# Patient Record
Sex: Male | Born: 1945 | Race: White | Hispanic: No | Marital: Married | Smoking: Former smoker
Health system: Southern US, Community
[De-identification: ages and names within clinical notes are randomized; demographics above are authoritative.]

## PROBLEM LIST (undated history)

## (undated) DIAGNOSIS — Z8719 Personal history of other diseases of the digestive system: Secondary | ICD-10-CM

## (undated) DIAGNOSIS — E039 Hypothyroidism, unspecified: Secondary | ICD-10-CM

## (undated) DIAGNOSIS — F32A Depression, unspecified: Secondary | ICD-10-CM

## (undated) DIAGNOSIS — K219 Gastro-esophageal reflux disease without esophagitis: Secondary | ICD-10-CM

## (undated) DIAGNOSIS — J189 Pneumonia, unspecified organism: Secondary | ICD-10-CM

## (undated) DIAGNOSIS — F329 Major depressive disorder, single episode, unspecified: Secondary | ICD-10-CM

## (undated) DIAGNOSIS — F419 Anxiety disorder, unspecified: Secondary | ICD-10-CM

## (undated) DIAGNOSIS — M1710 Unilateral primary osteoarthritis, unspecified knee: Secondary | ICD-10-CM

## (undated) DIAGNOSIS — G4730 Sleep apnea, unspecified: Secondary | ICD-10-CM

## (undated) DIAGNOSIS — I499 Cardiac arrhythmia, unspecified: Secondary | ICD-10-CM

## (undated) DIAGNOSIS — J449 Chronic obstructive pulmonary disease, unspecified: Secondary | ICD-10-CM

## (undated) DIAGNOSIS — C801 Malignant (primary) neoplasm, unspecified: Secondary | ICD-10-CM

## (undated) DIAGNOSIS — H9190 Unspecified hearing loss, unspecified ear: Secondary | ICD-10-CM

## (undated) DIAGNOSIS — Z9989 Dependence on other enabling machines and devices: Secondary | ICD-10-CM

## (undated) DIAGNOSIS — I10 Essential (primary) hypertension: Secondary | ICD-10-CM

## (undated) DIAGNOSIS — M19019 Primary osteoarthritis, unspecified shoulder: Secondary | ICD-10-CM

## (undated) DIAGNOSIS — N183 Chronic kidney disease, stage 3 unspecified: Secondary | ICD-10-CM

## (undated) HISTORY — PX: JOINT REPLACEMENT: SHX530

## (undated) HISTORY — PX: CARDIAC CATHETERIZATION: SHX172

## (undated) HISTORY — PX: NASAL SINUS SURGERY: SHX719

## (undated) HISTORY — PX: CHOLECYSTECTOMY: SHX55

## (undated) HISTORY — PX: SHOULDER ACROMIOPLASTY: SHX6093

## (undated) HISTORY — PX: EYE SURGERY: SHX253

---

## 1996-04-07 HISTORY — PX: TOTAL HIP ARTHROPLASTY: SHX124

## 2004-01-06 ENCOUNTER — Ambulatory Visit: Payer: Self-pay | Admitting: Internal Medicine

## 2004-02-23 ENCOUNTER — Ambulatory Visit: Payer: Self-pay | Admitting: Oncology

## 2004-03-08 ENCOUNTER — Ambulatory Visit: Payer: Self-pay | Admitting: Oncology

## 2004-04-07 ENCOUNTER — Ambulatory Visit: Payer: Self-pay | Admitting: Oncology

## 2004-05-31 ENCOUNTER — Ambulatory Visit: Payer: Self-pay | Admitting: Oncology

## 2004-06-05 ENCOUNTER — Ambulatory Visit: Payer: Self-pay | Admitting: Oncology

## 2004-08-16 ENCOUNTER — Ambulatory Visit: Payer: Self-pay | Admitting: Oncology

## 2004-08-30 ENCOUNTER — Ambulatory Visit: Payer: Self-pay | Admitting: Oncology

## 2004-09-05 ENCOUNTER — Ambulatory Visit: Payer: Self-pay | Admitting: Oncology

## 2004-10-18 ENCOUNTER — Ambulatory Visit: Payer: Self-pay | Admitting: Gastroenterology

## 2004-10-23 ENCOUNTER — Ambulatory Visit: Payer: Self-pay

## 2004-11-22 ENCOUNTER — Ambulatory Visit: Payer: Self-pay | Admitting: Oncology

## 2004-12-06 ENCOUNTER — Ambulatory Visit: Payer: Self-pay | Admitting: Oncology

## 2005-02-14 ENCOUNTER — Ambulatory Visit: Payer: Self-pay | Admitting: Oncology

## 2005-03-07 ENCOUNTER — Ambulatory Visit: Payer: Self-pay | Admitting: Oncology

## 2005-05-15 ENCOUNTER — Ambulatory Visit: Payer: Self-pay | Admitting: Oncology

## 2005-06-05 ENCOUNTER — Ambulatory Visit: Payer: Self-pay | Admitting: Oncology

## 2005-08-12 ENCOUNTER — Ambulatory Visit: Payer: Self-pay | Admitting: Oncology

## 2005-08-14 ENCOUNTER — Ambulatory Visit: Payer: Self-pay | Admitting: Oncology

## 2005-11-14 ENCOUNTER — Ambulatory Visit: Payer: Self-pay | Admitting: Oncology

## 2005-12-06 ENCOUNTER — Ambulatory Visit: Payer: Self-pay | Admitting: Oncology

## 2006-03-13 ENCOUNTER — Ambulatory Visit: Payer: Self-pay | Admitting: Oncology

## 2006-04-07 ENCOUNTER — Ambulatory Visit: Payer: Self-pay | Admitting: Oncology

## 2006-07-10 ENCOUNTER — Ambulatory Visit: Payer: Self-pay | Admitting: Oncology

## 2006-08-06 ENCOUNTER — Ambulatory Visit: Payer: Self-pay | Admitting: Oncology

## 2006-09-06 ENCOUNTER — Ambulatory Visit: Payer: Self-pay | Admitting: Oncology

## 2006-09-11 ENCOUNTER — Ambulatory Visit: Payer: Self-pay | Admitting: Oncology

## 2006-09-17 ENCOUNTER — Ambulatory Visit: Payer: Self-pay | Admitting: Oncology

## 2006-10-06 ENCOUNTER — Ambulatory Visit: Payer: Self-pay | Admitting: Oncology

## 2007-02-17 ENCOUNTER — Other Ambulatory Visit: Payer: Self-pay

## 2007-02-17 ENCOUNTER — Ambulatory Visit: Payer: Self-pay | Admitting: Unknown Physician Specialty

## 2007-02-18 ENCOUNTER — Ambulatory Visit: Payer: Self-pay | Admitting: Cardiology

## 2007-03-08 ENCOUNTER — Ambulatory Visit: Payer: Self-pay | Admitting: Oncology

## 2007-03-12 ENCOUNTER — Ambulatory Visit: Payer: Self-pay | Admitting: Oncology

## 2007-03-26 ENCOUNTER — Inpatient Hospital Stay: Payer: Self-pay | Admitting: Unknown Physician Specialty

## 2007-04-08 ENCOUNTER — Ambulatory Visit: Payer: Self-pay | Admitting: Oncology

## 2007-06-10 ENCOUNTER — Ambulatory Visit: Payer: Self-pay | Admitting: Oncology

## 2007-06-18 ENCOUNTER — Inpatient Hospital Stay: Payer: Self-pay | Admitting: Unknown Physician Specialty

## 2007-06-28 ENCOUNTER — Ambulatory Visit: Payer: Self-pay | Admitting: Oncology

## 2007-07-07 ENCOUNTER — Ambulatory Visit: Payer: Self-pay | Admitting: Oncology

## 2007-11-12 ENCOUNTER — Inpatient Hospital Stay: Payer: Self-pay | Admitting: Unknown Physician Specialty

## 2007-11-12 ENCOUNTER — Other Ambulatory Visit: Payer: Self-pay

## 2007-12-07 ENCOUNTER — Ambulatory Visit: Payer: Self-pay | Admitting: Oncology

## 2007-12-29 ENCOUNTER — Ambulatory Visit: Payer: Self-pay | Admitting: Oncology

## 2008-01-06 ENCOUNTER — Ambulatory Visit: Payer: Self-pay | Admitting: Oncology

## 2008-06-05 ENCOUNTER — Ambulatory Visit: Payer: Self-pay | Admitting: Oncology

## 2008-07-05 ENCOUNTER — Ambulatory Visit: Payer: Self-pay | Admitting: Oncology

## 2008-07-06 ENCOUNTER — Ambulatory Visit: Payer: Self-pay | Admitting: Oncology

## 2009-01-05 ENCOUNTER — Ambulatory Visit: Payer: Self-pay | Admitting: Oncology

## 2009-02-05 ENCOUNTER — Ambulatory Visit: Payer: Self-pay | Admitting: Oncology

## 2009-02-22 ENCOUNTER — Encounter: Payer: Self-pay | Admitting: General Practice

## 2009-03-07 ENCOUNTER — Encounter: Payer: Self-pay | Admitting: General Practice

## 2009-07-06 ENCOUNTER — Ambulatory Visit: Payer: Self-pay | Admitting: Oncology

## 2009-08-05 ENCOUNTER — Ambulatory Visit: Payer: Self-pay | Admitting: Oncology

## 2010-01-05 ENCOUNTER — Ambulatory Visit: Payer: Self-pay | Admitting: Oncology

## 2010-01-11 ENCOUNTER — Ambulatory Visit: Payer: Self-pay | Admitting: Oncology

## 2010-02-05 ENCOUNTER — Ambulatory Visit: Payer: Self-pay | Admitting: Oncology

## 2010-04-03 ENCOUNTER — Ambulatory Visit: Payer: Self-pay | Admitting: Internal Medicine

## 2010-07-30 ENCOUNTER — Ambulatory Visit: Payer: Self-pay | Admitting: Oncology

## 2010-08-06 ENCOUNTER — Ambulatory Visit: Payer: Self-pay | Admitting: Oncology

## 2011-01-30 ENCOUNTER — Ambulatory Visit: Payer: Self-pay | Admitting: Oncology

## 2011-02-06 ENCOUNTER — Ambulatory Visit: Payer: Self-pay | Admitting: Oncology

## 2011-03-19 ENCOUNTER — Ambulatory Visit: Payer: Self-pay | Admitting: Internal Medicine

## 2011-08-27 ENCOUNTER — Ambulatory Visit: Payer: Self-pay | Admitting: Oncology

## 2011-08-27 LAB — CBC CANCER CENTER
Basophil %: 1.8 %
Eosinophil #: 0.2 x10 3/mm (ref 0.0–0.7)
Eosinophil %: 7 %
HCT: 39.6 % — ABNORMAL LOW (ref 40.0–52.0)
Lymphocyte #: 1.3 x10 3/mm (ref 1.0–3.6)
Lymphocyte %: 37.3 %
MCH: 30.9 pg (ref 26.0–34.0)
MCV: 92 fL (ref 80–100)
Monocyte %: 15 %
Neutrophil %: 38.9 %
Platelet: 216 x10 3/mm (ref 150–440)
RBC: 4.28 10*6/uL — ABNORMAL LOW (ref 4.40–5.90)
RDW: 13.2 % (ref 11.5–14.5)
WBC: 3.5 x10 3/mm — ABNORMAL LOW (ref 3.8–10.6)

## 2011-08-27 LAB — COMPREHENSIVE METABOLIC PANEL
Albumin: 3.8 g/dL (ref 3.4–5.0)
Alkaline Phosphatase: 66 U/L (ref 50–136)
Bilirubin,Total: 0.6 mg/dL (ref 0.2–1.0)
Chloride: 103 mmol/L (ref 98–107)
Creatinine: 1.54 mg/dL — ABNORMAL HIGH (ref 0.60–1.30)
Glucose: 110 mg/dL — ABNORMAL HIGH (ref 65–99)
Osmolality: 285 (ref 275–301)
SGPT (ALT): 32 U/L
Sodium: 140 mmol/L (ref 136–145)

## 2011-08-27 LAB — LACTATE DEHYDROGENASE: LDH: 208 U/L (ref 87–241)

## 2011-09-06 ENCOUNTER — Ambulatory Visit: Payer: Self-pay | Admitting: Oncology

## 2011-09-10 LAB — CBC CANCER CENTER
Eosinophil #: 0.2 x10 3/mm (ref 0.0–0.7)
Eosinophil %: 5.7 %
Lymphocyte #: 1.2 x10 3/mm (ref 1.0–3.6)
Lymphocyte %: 35.9 %
MCH: 30.7 pg (ref 26.0–34.0)
MCV: 92 fL (ref 80–100)
Monocyte #: 0.5 x10 3/mm (ref 0.2–1.0)
Monocyte %: 15.1 %
Neutrophil #: 1.4 x10 3/mm (ref 1.4–6.5)
Platelet: 212 x10 3/mm (ref 150–440)
RBC: 4.4 10*6/uL (ref 4.40–5.90)

## 2011-10-06 ENCOUNTER — Ambulatory Visit: Payer: Self-pay | Admitting: Oncology

## 2012-06-17 ENCOUNTER — Ambulatory Visit: Payer: Self-pay | Admitting: Otolaryngology

## 2012-09-05 ENCOUNTER — Ambulatory Visit: Payer: Self-pay | Admitting: Oncology

## 2012-09-21 ENCOUNTER — Ambulatory Visit: Payer: Self-pay | Admitting: Internal Medicine

## 2012-09-23 LAB — CBC CANCER CENTER
Basophil #: 0.1 x10 3/mm (ref 0.0–0.1)
Basophil %: 1.4 %
Eosinophil #: 0.3 x10 3/mm (ref 0.0–0.7)
Eosinophil %: 7 %
HCT: 38.3 % — ABNORMAL LOW (ref 40.0–52.0)
HGB: 13.6 g/dL (ref 13.0–18.0)
Lymphocyte #: 1.4 x10 3/mm (ref 1.0–3.6)
MCHC: 35.5 g/dL (ref 32.0–36.0)
MCV: 92 fL (ref 80–100)
Monocyte %: 13.2 %
Neutrophil %: 42 %
Platelet: 215 x10 3/mm (ref 150–440)
RDW: 12.8 % (ref 11.5–14.5)

## 2012-09-23 LAB — COMPREHENSIVE METABOLIC PANEL
Alkaline Phosphatase: 83 U/L (ref 50–136)
BUN: 28 mg/dL — ABNORMAL HIGH (ref 7–18)
Calcium, Total: 9.4 mg/dL (ref 8.5–10.1)
Chloride: 103 mmol/L (ref 98–107)
EGFR (African American): 40 — ABNORMAL LOW
EGFR (Non-African Amer.): 34 — ABNORMAL LOW
Osmolality: 287 (ref 275–301)
SGPT (ALT): 30 U/L (ref 12–78)
Sodium: 140 mmol/L (ref 136–145)
Total Protein: 7.4 g/dL (ref 6.4–8.2)

## 2012-10-05 ENCOUNTER — Ambulatory Visit: Payer: Self-pay | Admitting: Oncology

## 2013-03-28 ENCOUNTER — Ambulatory Visit: Payer: Self-pay | Admitting: Internal Medicine

## 2013-08-31 ENCOUNTER — Other Ambulatory Visit: Payer: Self-pay | Admitting: Orthopedic Surgery

## 2013-08-31 DIAGNOSIS — M129 Arthropathy, unspecified: Secondary | ICD-10-CM

## 2013-09-02 ENCOUNTER — Other Ambulatory Visit: Payer: Self-pay

## 2013-09-05 ENCOUNTER — Other Ambulatory Visit: Payer: Self-pay | Admitting: Orthopedic Surgery

## 2013-09-07 ENCOUNTER — Ambulatory Visit
Admission: RE | Admit: 2013-09-07 | Discharge: 2013-09-07 | Disposition: A | Discharge status: Home / Self Care | Payer: Medicare Other | Source: Ambulatory Visit | Attending: Orthopedic Surgery | Admitting: Orthopedic Surgery

## 2013-09-07 DIAGNOSIS — M129 Arthropathy, unspecified: Secondary | ICD-10-CM

## 2013-09-23 ENCOUNTER — Encounter (HOSPITAL_COMMUNITY): Payer: Self-pay | Admitting: Pharmacist

## 2013-09-24 NOTE — Pre-Procedure Instructions (Signed)
Gavin Maxwell  09/24/2013   Your procedure is scheduled on:  June 30  Report to [facility] Admitting at 08:30 AM.  Call this number if you have problems the morning of surgery: [phone]   Remember:   Do not eat food or drink liquids after midnight.   Take these medicines the morning of surgery with A SIP OF WATER: Gabapentin, Pantoprazole,    STOP Naproxen/ Aleve, Multiple Vitamins, Calcium, Aspirin today   STOP/ Do not take Aspirin, Aleve, Naproxen, Advil, Ibuprofen, Motrin, Vitamins, Herbs, or Supplements starting today   Do not wear jewelry, make-up or nail polish.  Do not wear lotions, powders, or perfumes. You may wear deodorant.  Do not shave 48 hours prior to surgery. Men may shave face and neck.  Do not bring valuables to the hospital.  [facility] is not responsible for any belongings or valuables.               Contacts, dentures or bridgework may not be worn into surgery.  Leave suitcase in the car. After surgery it may be brought to your room.  For patients admitted to the hospital, discharge time is determined by your treatment team.               Special Instructions: See [facility] Preparing For Surgery   Please read over the following fact sheets that you were given: Pain Booklet, Coughing and Deep Breathing, Blood Transfusion Information and Surgical Site Infection Prevention

## 2013-09-24 NOTE — Pre-Procedure Instructions (Signed)
[facility] - Preparing for Surgery  Before surgery, you can play an important role.  Because skin is not sterile, your skin needs to be as free of germs as possible.  You can reduce the number of germs on you skin by washing with CHG (chlorahexidine gluconate) soap before surgery.  CHG is an antiseptic cleaner which kills germs and bonds with the skin to continue killing germs even after washing.  Please DO NOT use if you have an allergy to CHG or antibacterial soaps.  If your skin becomes reddened/irritated stop using the CHG and inform your nurse when you arrive at Short Stay.  Do not shave (including legs and underarms) for at least 48 hours prior to the first CHG shower.  You may shave your face.  Please follow these instructions carefully:   1.  Shower with CHG Soap the night before surgery and the morning of Surgery.  2.  If you choose to wash your hair, wash your hair first as usual with your normal shampoo.  3.  After you shampoo, rinse your hair and body thoroughly to remove the shampoo.  4.  Use CHG as you would any other liquid soap.  You can apply CHG directly to the skin and wash gently with scrungie or a clean washcloth.  5.  Apply the CHG Soap to your body ONLY FROM THE NECK DOWN.  Do not use on open wounds or open sores.  Avoid contact with your eyes, ears, mouth and genitals (private parts).  Wash genitals (private parts) with your normal soap.  6.  Wash thoroughly, paying special attention to the area where your surgery will be performed.  7.  Thoroughly rinse your body with warm water from the neck down.  8.  DO NOT shower/wash with your normal soap after using and rinsing off the CHG Soap.  9.  Pat yourself dry with a clean towel.            10.  Wear clean pajamas.            11.  Place clean sheets on your bed the night of your first shower and do not sleep with pets.  Day of Surgery  Do not apply any lotions the morning of surgery.  Please wear clean clothes to the  hospital/surgery center.   

## 2013-09-26 ENCOUNTER — Encounter (HOSPITAL_COMMUNITY): Payer: Self-pay

## 2013-09-26 ENCOUNTER — Encounter (HOSPITAL_COMMUNITY)
Admission: RE | Admit: 2013-09-26 | Discharge: 2013-09-26 | Disposition: A | Discharge status: Home / Self Care | Payer: Medicare Other | Source: Ambulatory Visit | Attending: Orthopedic Surgery | Admitting: Orthopedic Surgery

## 2013-09-26 ENCOUNTER — Ambulatory Visit (HOSPITAL_COMMUNITY)
Admission: RE | Admit: 2013-09-26 | Discharge: 2013-09-26 | Disposition: A | Discharge status: Home / Self Care | Payer: Medicare Other | Source: Ambulatory Visit | Attending: Orthopedic Surgery | Admitting: Orthopedic Surgery

## 2013-09-26 DIAGNOSIS — Z01810 Encounter for preprocedural cardiovascular examination: Secondary | ICD-10-CM | POA: Insufficient documentation

## 2013-09-26 DIAGNOSIS — Z01812 Encounter for preprocedural laboratory examination: Secondary | ICD-10-CM | POA: Insufficient documentation

## 2013-09-26 DIAGNOSIS — Z01818 Encounter for other preprocedural examination: Secondary | ICD-10-CM | POA: Insufficient documentation

## 2013-09-26 HISTORY — DX: Essential (primary) hypertension: I10

## 2013-09-26 HISTORY — DX: Sleep apnea, unspecified: G47.30

## 2013-09-26 HISTORY — DX: Depression, unspecified: F32.A

## 2013-09-26 HISTORY — DX: Major depressive disorder, single episode, unspecified: F32.9

## 2013-09-26 HISTORY — DX: Malignant (primary) neoplasm, unspecified: C80.1

## 2013-09-26 HISTORY — DX: Gastro-esophageal reflux disease without esophagitis: K21.9

## 2013-09-26 HISTORY — DX: Anxiety disorder, unspecified: F41.9

## 2013-09-26 LAB — BASIC METABOLIC PANEL
BUN: 29 mg/dL — AB (ref 6–23)
CO2: 26 mEq/L (ref 19–32)
Calcium: 9.8 mg/dL (ref 8.4–10.5)
Chloride: 102 mEq/L (ref 96–112)
Creatinine, Ser: 1.54 mg/dL — ABNORMAL HIGH (ref 0.50–1.35)
GFR calc non Af Amer: 44 mL/min — ABNORMAL LOW (ref >90)
GFR, EST AFRICAN AMERICAN: 51 mL/min — AB (ref >90)
Glucose, Bld: 112 mg/dL — ABNORMAL HIGH (ref 70–99)
POTASSIUM: 4.3 meq/L (ref 3.7–5.3)
SODIUM: 140 meq/L (ref 137–147)

## 2013-09-26 LAB — CBC
HCT: 37.1 % — ABNORMAL LOW (ref 39.0–52.0)
HEMOGLOBIN: 12.7 g/dL — AB (ref 13.0–17.0)
MCH: 31.5 pg (ref 26.0–34.0)
MCHC: 34.2 g/dL (ref 30.0–36.0)
MCV: 92.1 fL (ref 78.0–100.0)
Platelets: 205 10*3/uL (ref 150–400)
RBC: 4.03 MIL/uL — AB (ref 4.22–5.81)
RDW: 12.6 % (ref 11.5–15.5)
WBC: 3.9 10*3/uL — ABNORMAL LOW (ref 4.0–10.5)

## 2013-09-26 LAB — TYPE AND SCREEN
ABO/RH(D): O NEG
ANTIBODY SCREEN: NEGATIVE

## 2013-09-26 LAB — ABO/RH: ABO/RH(D): O NEG

## 2013-10-03 DIAGNOSIS — N289 Disorder of kidney and ureter, unspecified: Secondary | ICD-10-CM | POA: Insufficient documentation

## 2013-10-03 DIAGNOSIS — D649 Anemia, unspecified: Secondary | ICD-10-CM | POA: Insufficient documentation

## 2013-10-03 MED ORDER — CEFAZOLIN SODIUM-DEXTROSE 2-3 GM-% IV SOLR
2.0000 g | INTRAVENOUS | Status: AC
Start: 2013-10-04 — End: 2013-10-04
  Administered 2013-10-04: 2 g via INTRAVENOUS
  Filled 2013-10-03: qty 50

## 2013-10-04 ENCOUNTER — Inpatient Hospital Stay (HOSPITAL_COMMUNITY): Payer: Medicare Other | Admitting: Anesthesiology

## 2013-10-04 ENCOUNTER — Encounter (HOSPITAL_COMMUNITY): Payer: Self-pay | Admitting: Certified Registered Nurse Anesthetist

## 2013-10-04 ENCOUNTER — Encounter (HOSPITAL_COMMUNITY)
Admission: RE | Disposition: A | Discharge status: Home / Self Care | Payer: Self-pay | Source: Ambulatory Visit | Attending: Orthopedic Surgery

## 2013-10-04 ENCOUNTER — Inpatient Hospital Stay (HOSPITAL_COMMUNITY): Payer: Medicare Other

## 2013-10-04 ENCOUNTER — Encounter (HOSPITAL_COMMUNITY): Payer: Medicare Other | Admitting: Anesthesiology

## 2013-10-04 ENCOUNTER — Inpatient Hospital Stay (HOSPITAL_COMMUNITY)
Admission: RE | Admit: 2013-10-04 | Discharge: 2013-10-05 | DRG: 483 | Disposition: A | Discharge status: Home / Self Care | Payer: Medicare Other | Source: Ambulatory Visit | Attending: Orthopedic Surgery | Admitting: Orthopedic Surgery

## 2013-10-04 DIAGNOSIS — Z96649 Presence of unspecified artificial hip joint: Secondary | ICD-10-CM

## 2013-10-04 DIAGNOSIS — M19019 Primary osteoarthritis, unspecified shoulder: Secondary | ICD-10-CM | POA: Diagnosis present

## 2013-10-04 DIAGNOSIS — Z7982 Long term (current) use of aspirin: Secondary | ICD-10-CM

## 2013-10-04 DIAGNOSIS — G4730 Sleep apnea, unspecified: Secondary | ICD-10-CM | POA: Diagnosis present

## 2013-10-04 DIAGNOSIS — Z87891 Personal history of nicotine dependence: Secondary | ICD-10-CM

## 2013-10-04 DIAGNOSIS — I10 Essential (primary) hypertension: Secondary | ICD-10-CM | POA: Diagnosis present

## 2013-10-04 DIAGNOSIS — Z87898 Personal history of other specified conditions: Secondary | ICD-10-CM

## 2013-10-04 HISTORY — PX: REVERSE SHOULDER ARTHROPLASTY: SHX5054

## 2013-10-04 HISTORY — DX: Primary osteoarthritis, unspecified shoulder: M19.019

## 2013-10-04 SURGERY — ARTHROPLASTY, SHOULDER, TOTAL, REVERSE
Anesthesia: General | Site: Shoulder | Laterality: Right

## 2013-10-04 MED ORDER — 0.9 % SODIUM CHLORIDE (POUR BTL) OPTIME
TOPICAL | Status: DC | PRN
  Administered 2013-10-04: 1000 mL

## 2013-10-04 MED ORDER — GABAPENTIN 300 MG PO CAPS
300.0000 mg | ORAL_CAPSULE | Freq: Two times a day (BID) | ORAL | Status: DC
  Administered 2013-10-04 – 2013-10-05 (×3): 300 mg via ORAL
  Filled 2013-10-04 (×4): qty 1

## 2013-10-04 MED ORDER — ONDANSETRON HCL 4 MG PO TABS: 4.0000 mg | ORAL_TABLET | Freq: Four times a day (QID) | ORAL | Status: DC | PRN

## 2013-10-04 MED ORDER — MIDAZOLAM HCL 2 MG/2ML IJ SOLN
INTRAMUSCULAR | Status: AC
  Administered 2013-10-04: 2 mg
  Filled 2013-10-04: qty 2

## 2013-10-04 MED ORDER — POTASSIUM CHLORIDE IN NACL 20-0.45 MEQ/L-% IV SOLN
INTRAVENOUS | Status: DC
  Administered 2013-10-04 – 2013-10-05 (×2): via INTRAVENOUS
  Filled 2013-10-04 (×3): qty 1000

## 2013-10-04 MED ORDER — DEXAMETHASONE SODIUM PHOSPHATE 10 MG/ML IJ SOLN
INTRAMUSCULAR | Status: DC | PRN
  Administered 2013-10-04: 4 mg via INTRAVENOUS

## 2013-10-04 MED ORDER — DOCUSATE SODIUM 100 MG PO CAPS
100.0000 mg | ORAL_CAPSULE | Freq: Two times a day (BID) | ORAL | Status: DC
  Administered 2013-10-04 – 2013-10-05 (×3): 100 mg via ORAL
  Filled 2013-10-04 (×4): qty 1

## 2013-10-04 MED ORDER — ONDANSETRON HCL 4 MG/2ML IJ SOLN
INTRAMUSCULAR | Status: DC | PRN
  Administered 2013-10-04: 4 mg via INTRAVENOUS

## 2013-10-04 MED ORDER — FLUTICASONE PROPIONATE 50 MCG/ACT NA SUSP
2.0000 | Freq: Every day | NASAL | Status: DC
  Filled 2013-10-04: qty 16

## 2013-10-04 MED ORDER — PHENYLEPHRINE HCL 10 MG/ML IJ SOLN
10.0000 mg | INTRAVENOUS | Status: DC | PRN
  Administered 2013-10-04: 40 ug/min via INTRAVENOUS

## 2013-10-04 MED ORDER — ASPIRIN EC 81 MG PO TBEC
81.0000 mg | DELAYED_RELEASE_TABLET | Freq: Every morning | ORAL | Status: DC
  Administered 2013-10-05: 81 mg via ORAL
  Filled 2013-10-04: qty 1

## 2013-10-04 MED ORDER — ONDANSETRON HCL 4 MG PO TABS: 4.0000 mg | ORAL_TABLET | Freq: Three times a day (TID) | ORAL | Status: DC | PRN

## 2013-10-04 MED ORDER — LIDOCAINE HCL (CARDIAC) 20 MG/ML IV SOLN
INTRAVENOUS | Status: DC | PRN
  Administered 2013-10-04: 80 mg via INTRAVENOUS

## 2013-10-04 MED ORDER — OXYCODONE HCL 5 MG PO TABS
5.0000 mg | ORAL_TABLET | ORAL | Status: DC | PRN
  Administered 2013-10-04: 5 mg via ORAL

## 2013-10-04 MED ORDER — CALCIUM CARBONATE-VITAMIN D 500-200 MG-UNIT PO TABS
2.0000 | ORAL_TABLET | Freq: Every day | ORAL | Status: DC
  Administered 2013-10-04 – 2013-10-05 (×2): 2 via ORAL
  Filled 2013-10-04 (×2): qty 2

## 2013-10-04 MED ORDER — PROPOFOL 10 MG/ML IV BOLUS
INTRAVENOUS | Status: DC | PRN
  Administered 2013-10-04: 150 mg via INTRAVENOUS

## 2013-10-04 MED ORDER — ONDANSETRON HCL 4 MG/2ML IJ SOLN
INTRAMUSCULAR | Status: AC
  Filled 2013-10-04: qty 2

## 2013-10-04 MED ORDER — METOCLOPRAMIDE HCL 5 MG PO TABS
5.0000 mg | ORAL_TABLET | Freq: Three times a day (TID) | ORAL | Status: DC | PRN
  Filled 2013-10-04: qty 2

## 2013-10-04 MED ORDER — BACLOFEN 10 MG PO TABS: 10.0000 mg | ORAL_TABLET | Freq: Three times a day (TID) | ORAL | Status: DC

## 2013-10-04 MED ORDER — METHOCARBAMOL 1000 MG/10ML IJ SOLN
500.0000 mg | Freq: Four times a day (QID) | INTRAVENOUS | Status: DC | PRN
  Filled 2013-10-04: qty 5

## 2013-10-04 MED ORDER — STERILE WATER FOR INJECTION IJ SOLN
INTRAMUSCULAR | Status: AC
  Filled 2013-10-04: qty 10

## 2013-10-04 MED ORDER — GLYCOPYRROLATE 0.2 MG/ML IJ SOLN
INTRAMUSCULAR | Status: AC
  Filled 2013-10-04: qty 5

## 2013-10-04 MED ORDER — EPHEDRINE SULFATE 50 MG/ML IJ SOLN
INTRAMUSCULAR | Status: DC | PRN
  Administered 2013-10-04 (×3): 5 mg via INTRAVENOUS
  Administered 2013-10-04: 10 mg via INTRAVENOUS

## 2013-10-04 MED ORDER — ARTIFICIAL TEARS OP OINT
TOPICAL_OINTMENT | OPHTHALMIC | Status: DC | PRN
  Administered 2013-10-04: 1 via OPHTHALMIC

## 2013-10-04 MED ORDER — ATORVASTATIN CALCIUM 80 MG PO TABS
80.0000 mg | ORAL_TABLET | Freq: Every day | ORAL | Status: DC
  Administered 2013-10-04: 80 mg via ORAL
  Filled 2013-10-04 (×2): qty 1

## 2013-10-04 MED ORDER — OXYCODONE HCL 5 MG PO TABS
ORAL_TABLET | ORAL | Status: AC
  Filled 2013-10-04: qty 1

## 2013-10-04 MED ORDER — FENTANYL CITRATE 0.05 MG/ML IJ SOLN
INTRAMUSCULAR | Status: AC
  Filled 2013-10-04: qty 2

## 2013-10-04 MED ORDER — LACTATED RINGERS IV SOLN
INTRAVENOUS | Status: DC | PRN
  Administered 2013-10-04 (×2): via INTRAVENOUS

## 2013-10-04 MED ORDER — NEOSTIGMINE METHYLSULFATE 10 MG/10ML IV SOLN
INTRAVENOUS | Status: AC
  Filled 2013-10-04: qty 1

## 2013-10-04 MED ORDER — ROCURONIUM BROMIDE 50 MG/5ML IV SOLN
INTRAVENOUS | Status: AC
  Filled 2013-10-04: qty 1

## 2013-10-04 MED ORDER — FENTANYL CITRATE 0.05 MG/ML IJ SOLN
25.0000 ug | INTRAMUSCULAR | Status: DC | PRN
  Administered 2013-10-04: 50 ug via INTRAVENOUS
  Administered 2013-10-04 (×4): 25 ug via INTRAVENOUS

## 2013-10-04 MED ORDER — METOPROLOL SUCCINATE ER 25 MG PO TB24
25.0000 mg | ORAL_TABLET | Freq: Every day | ORAL | Status: DC
  Administered 2013-10-04: 25 mg via ORAL
  Filled 2013-10-04 (×2): qty 1

## 2013-10-04 MED ORDER — ACETAMINOPHEN 650 MG RE SUPP
650.0000 mg | Freq: Four times a day (QID) | RECTAL | Status: DC | PRN
Start: 2013-10-04 — End: 2013-10-05

## 2013-10-04 MED ORDER — PROPOFOL 10 MG/ML IV BOLUS
INTRAVENOUS | Status: AC
  Filled 2013-10-04: qty 20

## 2013-10-04 MED ORDER — SENNA 8.6 MG PO TABS
1.0000 | ORAL_TABLET | Freq: Two times a day (BID) | ORAL | Status: DC
  Administered 2013-10-04 – 2013-10-05 (×3): 8.6 mg via ORAL
  Filled 2013-10-04 (×4): qty 1

## 2013-10-04 MED ORDER — HYDROMORPHONE HCL PF 1 MG/ML IJ SOLN
0.5000 mg | INTRAMUSCULAR | Status: DC | PRN
  Administered 2013-10-04 (×2): 1 mg via INTRAVENOUS
  Filled 2013-10-04 (×2): qty 1

## 2013-10-04 MED ORDER — ROCURONIUM BROMIDE 100 MG/10ML IV SOLN
INTRAVENOUS | Status: DC | PRN
  Administered 2013-10-04: 40 mg via INTRAVENOUS

## 2013-10-04 MED ORDER — LACTATED RINGERS IV SOLN
INTRAVENOUS | Status: DC
  Administered 2013-10-04: 09:00:00 via INTRAVENOUS

## 2013-10-04 MED ORDER — FENTANYL CITRATE 0.05 MG/ML IJ SOLN
INTRAMUSCULAR | Status: AC
  Filled 2013-10-04: qty 5

## 2013-10-04 MED ORDER — ALUM & MAG HYDROXIDE-SIMETH 200-200-20 MG/5ML PO SUSP: 30.0000 mL | ORAL | Status: DC | PRN

## 2013-10-04 MED ORDER — METHOCARBAMOL 500 MG PO TABS
ORAL_TABLET | ORAL | Status: AC
  Filled 2013-10-04: qty 1

## 2013-10-04 MED ORDER — PANTOPRAZOLE SODIUM 40 MG PO TBEC
40.0000 mg | DELAYED_RELEASE_TABLET | Freq: Every morning | ORAL | Status: DC
  Administered 2013-10-05: 40 mg via ORAL
  Filled 2013-10-04: qty 1

## 2013-10-04 MED ORDER — SODIUM CHLORIDE 0.9 % IR SOLN
Status: DC | PRN
  Administered 2013-10-04: 1000 mL

## 2013-10-04 MED ORDER — METHOCARBAMOL 500 MG PO TABS
500.0000 mg | ORAL_TABLET | Freq: Four times a day (QID) | ORAL | Status: DC | PRN
  Administered 2013-10-04: 500 mg via ORAL

## 2013-10-04 MED ORDER — CEFAZOLIN SODIUM-DEXTROSE 2-3 GM-% IV SOLR
2.0000 g | Freq: Four times a day (QID) | INTRAVENOUS | Status: AC
  Administered 2013-10-04 – 2013-10-05 (×3): 2 g via INTRAVENOUS
  Filled 2013-10-04 (×4): qty 50

## 2013-10-04 MED ORDER — NEOSTIGMINE METHYLSULFATE 10 MG/10ML IV SOLN
INTRAVENOUS | Status: DC | PRN
  Administered 2013-10-04: 3 mg via INTRAVENOUS
  Administered 2013-10-04: 2 mg via INTRAVENOUS

## 2013-10-04 MED ORDER — MENTHOL 3 MG MT LOZG: 1.0000 | LOZENGE | OROMUCOSAL | Status: DC | PRN

## 2013-10-04 MED ORDER — SERTRALINE HCL 50 MG PO TABS
50.0000 mg | ORAL_TABLET | Freq: Every day | ORAL | Status: DC
  Administered 2013-10-05: 50 mg via ORAL
  Filled 2013-10-04 (×2): qty 1

## 2013-10-04 MED ORDER — FENTANYL CITRATE 0.05 MG/ML IJ SOLN
INTRAMUSCULAR | Status: DC | PRN
Start: 2013-10-04 — End: 2013-10-04
  Administered 2013-10-04 (×2): 50 ug via INTRAVENOUS

## 2013-10-04 MED ORDER — POLYETHYLENE GLYCOL 3350 17 G PO PACK: 17.0000 g | PACK | Freq: Every day | ORAL | Status: DC | PRN

## 2013-10-04 MED ORDER — METOCLOPRAMIDE HCL 5 MG/ML IJ SOLN: 5.0000 mg | Freq: Three times a day (TID) | INTRAMUSCULAR | Status: DC | PRN

## 2013-10-04 MED ORDER — FENTANYL CITRATE 0.05 MG/ML IJ SOLN
INTRAMUSCULAR | Status: AC
  Administered 2013-10-04: 25 ug via INTRAVENOUS
  Filled 2013-10-04: qty 2

## 2013-10-04 MED ORDER — ONDANSETRON HCL 4 MG/2ML IJ SOLN: 4.0000 mg | Freq: Four times a day (QID) | INTRAMUSCULAR | Status: DC | PRN

## 2013-10-04 MED ORDER — ACETAMINOPHEN 325 MG PO TABS: 650.0000 mg | ORAL_TABLET | Freq: Four times a day (QID) | ORAL | Status: DC | PRN

## 2013-10-04 MED ORDER — GLYCOPYRROLATE 0.2 MG/ML IJ SOLN
INTRAMUSCULAR | Status: DC | PRN
  Administered 2013-10-04: 0.4 mg via INTRAVENOUS
  Administered 2013-10-04: 0.6 mg via INTRAVENOUS

## 2013-10-04 MED ORDER — MIDAZOLAM HCL 2 MG/2ML IJ SOLN
INTRAMUSCULAR | Status: AC
  Filled 2013-10-04: qty 2

## 2013-10-04 MED ORDER — EPHEDRINE SULFATE 50 MG/ML IJ SOLN
INTRAMUSCULAR | Status: AC
  Filled 2013-10-04: qty 1

## 2013-10-04 MED ORDER — LIDOCAINE HCL (CARDIAC) 20 MG/ML IV SOLN
INTRAVENOUS | Status: AC
  Filled 2013-10-04: qty 5

## 2013-10-04 MED ORDER — SENNA-DOCUSATE SODIUM 8.6-50 MG PO TABS: 2.0000 | ORAL_TABLET | Freq: Every day | ORAL | Status: DC

## 2013-10-04 MED ORDER — PHENOL 1.4 % MT LIQD: 1.0000 | OROMUCOSAL | Status: DC | PRN

## 2013-10-04 MED ORDER — OXYCODONE-ACETAMINOPHEN 5-325 MG PO TABS
1.0000 | ORAL_TABLET | ORAL | Status: DC | PRN
  Administered 2013-10-04 – 2013-10-05 (×4): 2 via ORAL
  Filled 2013-10-04 (×4): qty 2

## 2013-10-04 MED ORDER — BISACODYL 5 MG PO TBEC: 5.0000 mg | DELAYED_RELEASE_TABLET | Freq: Every day | ORAL | Status: DC | PRN

## 2013-10-04 MED ORDER — ARTIFICIAL TEARS OP OINT
TOPICAL_OINTMENT | OPHTHALMIC | Status: AC
  Filled 2013-10-04: qty 7

## 2013-10-04 MED ORDER — DIPHENHYDRAMINE HCL 12.5 MG/5ML PO ELIX: 12.5000 mg | ORAL_SOLUTION | ORAL | Status: DC | PRN

## 2013-10-04 MED ORDER — FENTANYL CITRATE 0.05 MG/ML IJ SOLN
INTRAMUSCULAR | Status: AC
  Administered 2013-10-04: 100 ug
  Filled 2013-10-04: qty 2

## 2013-10-04 MED ORDER — FENOFIBRATE 160 MG PO TABS
160.0000 mg | ORAL_TABLET | Freq: Every morning | ORAL | Status: DC
  Administered 2013-10-05: 160 mg via ORAL
  Filled 2013-10-04: qty 1

## 2013-10-04 MED ORDER — OXYCODONE-ACETAMINOPHEN 10-325 MG PO TABS: 1.0000 | ORAL_TABLET | Freq: Four times a day (QID) | ORAL | Status: DC | PRN

## 2013-10-04 MED ORDER — ADULT MULTIVITAMIN W/MINERALS CH
1.0000 | ORAL_TABLET | Freq: Every morning | ORAL | Status: DC
Start: 2013-10-05 — End: 2013-10-05
  Administered 2013-10-05: 1 via ORAL
  Filled 2013-10-04: qty 1

## 2013-10-04 SURGICAL SUPPLY — 69 items
BENZOIN TINCTURE PRP APPL 2/3 (GAUZE/BANDAGES/DRESSINGS) ×4 IMPLANT
BIT DRILL F/CENTRAL SCRW 3.2 (BIT) ×2
BIT DRILL F/CENTRAL SCRW 3.2MM (BIT) ×2 IMPLANT
BIT DRILL TWIST 2.7 (BIT) ×3 IMPLANT
BIT DRILL TWIST 2.7MM (BIT) ×1
BLADE SAW SGTL MED 73X18.5 STR (BLADE) ×4 IMPLANT
BOOTCOVER CLEANROOM LRG (PROTECTIVE WEAR) ×8 IMPLANT
BOWL SMART MIX CTS (DISPOSABLE) IMPLANT
BRUSH FEMORAL CANAL (MISCELLANEOUS) IMPLANT
CAP REVERSE SHOULDER ×4 IMPLANT
CLOSURE STERI-STRIP 1/2X4 (GAUZE/BANDAGES/DRESSINGS) ×1
CLOSURE WOUND 1/2 X4 (GAUZE/BANDAGES/DRESSINGS) ×1
CLSR STERI-STRIP ANTIMIC 1/2X4 (GAUZE/BANDAGES/DRESSINGS) ×3 IMPLANT
COVER SURGICAL LIGHT HANDLE (MISCELLANEOUS) ×4 IMPLANT
COVER TABLE BACK 60X90 (DRAPES) IMPLANT
DRAPE C-ARM 42X72 X-RAY (DRAPES) IMPLANT
DRAPE INCISE IOBAN 66X45 STRL (DRAPES) ×4 IMPLANT
DRAPE U-SHAPE 47X51 STRL (DRAPES) ×4 IMPLANT
DRILL BIT F/CENTRAL SCRW 3.2MM (BIT) ×2
DRSG MEPILEX BORDER 4X8 (GAUZE/BANDAGES/DRESSINGS) ×4 IMPLANT
DURAPREP 26ML APPLICATOR (WOUND CARE) ×4 IMPLANT
ELECT BLADE 6.5 EXT (BLADE) IMPLANT
ELECT NEEDLE TIP 2.8 STRL (NEEDLE) ×4 IMPLANT
ELECT REM PT RETURN 9FT ADLT (ELECTROSURGICAL) ×4
ELECTRODE REM PT RTRN 9FT ADLT (ELECTROSURGICAL) ×2 IMPLANT
EVACUATOR 1/8 PVC DRAIN (DRAIN) IMPLANT
FACESHIELD WRAPAROUND (MASK) IMPLANT
GLOVE BIOGEL PI ORTHO PRO SZ8 (GLOVE) ×4
GLOVE ORTHO TXT STRL SZ7.5 (GLOVE) ×4 IMPLANT
GLOVE PI ORTHO PRO STRL SZ8 (GLOVE) ×4 IMPLANT
GLOVE SURG ORTHO 8.0 STRL STRW (GLOVE) ×8 IMPLANT
GOWN BRE IMP PREV XXLGXLNG (GOWN DISPOSABLE) ×4 IMPLANT
GOWN STRL REUS W/ TWL XL LVL3 (GOWN DISPOSABLE) ×4 IMPLANT
GOWN STRL REUS W/TWL XL LVL3 (GOWN DISPOSABLE) ×4
HANDPIECE INTERPULSE COAX TIP (DISPOSABLE) ×2
HOOD PEEL AWAY FACE SHEILD DIS (HOOD) ×8 IMPLANT
KIT BASIN OR (CUSTOM PROCEDURE TRAY) ×4 IMPLANT
KIT ROOM TURNOVER OR (KITS) ×4 IMPLANT
MANIFOLD NEPTUNE II (INSTRUMENTS) ×4 IMPLANT
NEEDLE 1/2 CIR CATGUT .05X1.09 (NEEDLE) ×4 IMPLANT
NEEDLE HYPO 25GX1X1/2 BEV (NEEDLE) IMPLANT
NS IRRIG 1000ML POUR BTL (IV SOLUTION) ×4 IMPLANT
PACK SHOULDER (CUSTOM PROCEDURE TRAY) ×4 IMPLANT
PAD ABD 8X10 STRL (GAUZE/BANDAGES/DRESSINGS) ×4 IMPLANT
PAD ARMBOARD 7.5X6 YLW CONV (MISCELLANEOUS) ×8 IMPLANT
PIN THREADED REVERSE (PIN) ×4 IMPLANT
SET HNDPC FAN SPRY TIP SCT (DISPOSABLE) ×2 IMPLANT
SLING ARM IMMOBILIZER LRG (SOFTGOODS) ×4 IMPLANT
SLING ARM IMMOBILIZER MED (SOFTGOODS) IMPLANT
SMARTMIX MINI TOWER (MISCELLANEOUS)
SPONGE GAUZE 4X4 12PLY (GAUZE/BANDAGES/DRESSINGS) ×4 IMPLANT
SPONGE LAP 18X18 X RAY DECT (DISPOSABLE) ×8 IMPLANT
STRIP CLOSURE SKIN 1/2X4 (GAUZE/BANDAGES/DRESSINGS) ×3 IMPLANT
SUCTION FRAZIER TIP 10 FR DISP (SUCTIONS) ×4 IMPLANT
SUPPORT WRAP ARM LG (MISCELLANEOUS) ×4 IMPLANT
SUT FIBERWIRE #2 38 REV NDL BL (SUTURE) ×20
SUT MNCRL AB 4-0 PS2 18 (SUTURE) IMPLANT
SUT VIC AB 0 CT1 27 (SUTURE) ×2
SUT VIC AB 0 CT1 27XBRD ANBCTR (SUTURE) ×2 IMPLANT
SUT VIC AB 2-0 CT1 27 (SUTURE)
SUT VIC AB 2-0 CT1 TAPERPNT 27 (SUTURE) IMPLANT
SUT VIC AB 3-0 SH 8-18 (SUTURE) ×4 IMPLANT
SUTURE FIBERWR#2 38 REV NDL BL (SUTURE) ×10 IMPLANT
SYR CONTROL 10ML LL (SYRINGE) IMPLANT
TOWEL OR 17X24 6PK STRL BLUE (TOWEL DISPOSABLE) ×4 IMPLANT
TOWEL OR 17X26 10 PK STRL BLUE (TOWEL DISPOSABLE) ×4 IMPLANT
TOWER SMARTMIX MINI (MISCELLANEOUS) IMPLANT
TRAY FOLEY CATH 16FRSI W/METER (SET/KITS/TRAYS/PACK) IMPLANT
WATER STERILE IRR 1000ML POUR (IV SOLUTION) ×4 IMPLANT

## 2013-10-04 NOTE — Anesthesia Preprocedure Evaluation (Addendum)
Anesthesia Evaluation  Patient identified by MRN, date of birth, ID band Patient awake    Reviewed: Allergy & Precautions, H&P , NPO status , Patient's Chart, lab work & pertinent test results, reviewed documented beta blocker date and time   Airway Mallampati: II TM Distance: >3 FB Neck ROM: Full    Dental  (+) Chipped, Missing, Dental Advisory Given   Pulmonary sleep apnea , former smoker,          Cardiovascular hypertension, Pt. on medications and Pt. on home beta blockers Rhythm:Regular Rate:Normal     Neuro/Psych Anxiety Depression    GI/Hepatic Neg liver ROS, GERD-  Medicated and Controlled,  Endo/Other    Renal/GU negative Renal ROS     Musculoskeletal   Abdominal   Peds  Hematology   Anesthesia Other Findings   Reproductive/Obstetrics                         Anesthesia Physical Anesthesia Plan  ASA: III  Anesthesia Plan: General   Post-op Pain Management:    Induction: Intravenous  Airway Management Planned: Oral ETT  Additional Equipment:   Intra-op Plan:   Post-operative Plan: Extubation in OR  Informed Consent: I have reviewed the patients History and Physical, chart, labs and discussed the procedure including the risks, benefits and alternatives for the proposed anesthesia with the patient or authorized representative who has indicated his/her understanding and acceptance.   Dental advisory given  Plan Discussed with: Anesthesiologist and CRNA  Anesthesia Plan Comments:         Anesthesia Quick Evaluation

## 2013-10-04 NOTE — Discharge Summary (Signed)
Physician Discharge Summary  Patient ID: Gavin Maxwell MRN: [id number] DOB/AGE: [birth year] 68 y.o.  Admit date: 10/04/2013 Discharge date: 10/05/2013  Admission Diagnoses:  Right rotator cuff arthropathy  Discharge Diagnoses:  Right rotator cuff arthropathy  Past Medical History  Diagnosis Date  . Hypertension   . Anxiety   . Depression   . GERD (gastroesophageal reflux disease)   . Cancer     non hodgins  lymphoma  . Sleep apnea     cpap  . Primary localized osteoarthrosis, shoulder region, rotator cuff arthropathy 10/04/2013    Surgeries: Procedure(s): REVERSE SHOULDER ARTHROPLASTY on 10/04/2013   Consultants (if any):    Discharged Condition: Improved  Hospital Course: Gavin Maxwell is an 68 y.o. male who was admitted 10/04/2013 with a diagnosis of Primary localized osteoarthrosis, shoulder region and went to the operating room on 10/04/2013 and underwent the above named procedures.    He was given perioperative antibiotics:  Anti-infectives   Start     Dose/Rate Route Frequency Ordered Stop   10/04/13 0600  ceFAZolin (ANCEF) IVPB 2 g/50 mL premix     2 g 100 mL/hr over 30 Minutes Intravenous On call to O.R. 10/03/13 1418 10/04/13 1025    .  He was given sequential compression devices, early ambulation, for DVT prophylaxis.  He benefited maximally from the hospital stay and there were no complications.    Recent vital signs:  Filed Vitals:   10/04/13 1430  BP: 129/66  Pulse: 76  Temp: 97.8 F (36.6 C)  Resp: 14    Recent laboratory studies:  Lab Results  Component Value Date   HGB 12.7* 09/26/2013   Lab Results  Component Value Date   WBC 3.9* 09/26/2013   PLT 205 09/26/2013   No results found for this basename: INR   Lab Results  Component Value Date   NA 140 09/26/2013   K 4.3 09/26/2013   CL 102 09/26/2013   CO2 26 09/26/2013   BUN 29* 09/26/2013   CREATININE 1.54* 09/26/2013   GLUCOSE 112* 09/26/2013    Discharge Medications:     Medication  List    STOP taking these medications       ALEVE 220 MG tablet  Generic drug:  naproxen sodium      TAKE these medications       aspirin EC 81 MG tablet  Take 81 mg by mouth every morning.     atorvastatin 80 MG tablet  Commonly known as:  LIPITOR  Take 80 mg by mouth daily with supper.     baclofen 10 MG tablet  Commonly known as:  LIORESAL  Take 1 tablet (10 mg total) by mouth 3 (three) times daily. As needed for muscle spasm     CALCIUM-VITAMIN D PO  Take 1 tablet by mouth every morning.     fenofibrate 160 MG tablet  Take 160 mg by mouth every morning.     fluticasone 50 MCG/ACT nasal spray  Commonly known as:  FLONASE  Place 2 sprays into both nostrils at bedtime.     gabapentin 300 MG capsule  Commonly known as:  NEURONTIN  Take 300 mg by mouth 2 (two) times daily.     IRON PO  Take 1 tablet by mouth every morning.     metoprolol succinate 25 MG 24 hr tablet  Commonly known as:  TOPROL-XL  Take 25 mg by mouth daily with supper.     multivitamin with minerals Tabs tablet  Take  1 tablet by mouth every morning.     ondansetron 4 MG tablet  Commonly known as:  ZOFRAN  Take 1 tablet (4 mg total) by mouth every 8 (eight) hours as needed for nausea or vomiting.     oxyCODONE-acetaminophen 10-325 MG per tablet  Commonly known as:  PERCOCET  Take 1-2 tablets by mouth every 6 (six) hours as needed for pain. MAXIMUM TOTAL ACETAMINOPHEN DOSE IS 4000 MG PER DAY     pantoprazole 40 MG tablet  Commonly known as:  PROTONIX  Take 40 mg by mouth every morning.     sennosides-docusate sodium 8.6-50 MG tablet  Commonly known as:  SENOKOT-S  Take 2 tablets by mouth daily.     sertraline 50 MG tablet  Commonly known as:  ZOLOFT  Take 50 mg by mouth at bedtime.        Diagnostic Studies: Dg Chest 2 View  09/26/2013   CLINICAL DATA:  Preop right shoulder surgery.  EXAM: CHEST  2 VIEW  COMPARISON:  None.  FINDINGS: Prior left shoulder surgery with abnormal  appearance of the left shoulder prosthesis/left glenoid incompletely assessed on present exam.  Heart size top-normal to minimally enlarged. Coronary artery calcifications suspected.  Slightly tortuous aorta.  Central pulmonary vascular prominence without pulmonary edema.  No segmental consolidation.  No plain film evidence of mediastinal/hilar adenopathy detected in this patient who has a history of lymphoma.  IMPRESSION: Prior left shoulder surgery with abnormal appearance of the left shoulder prosthesis/left glenoid incompletely assessed on present exam.  Heart size top-normal to minimally enlarged. Coronary artery calcifications suspected.  Slightly tortuous aorta.  Central pulmonary vascular prominence without pulmonary edema.  No segmental consolidation.  No plain film evidence of mediastinal/hilar adenopathy detected in this patient who has a history of lymphoma.   Electronically Signed   By: Chauncey Cruel M.D.   On: 09/26/2013 13:45   Dg Shoulder Right  10/04/2013   CLINICAL DATA:  Status post right shoulder replacement  EXAM: RIGHT SHOULDER - 2+ VIEW  COMPARISON:  None.  FINDINGS: There are changes consistent with a right shoulder replacement. Air is noted within the joint space related to recent surgery. No acute bony abnormality is noted.   Electronically Signed   By: Inez Catalina M.D.   On: 10/04/2013 13:39   Ct Shoulder Right Wo Contrast  09/07/2013   CLINICAL DATA:  Right shoulder  EXAM: CT OF THE RIGHT SHOULDER WITHOUT CONTRAST  TECHNIQUE: Multidetector CT imaging was performed according to the standard protocol. Multiplanar CT image reconstructions were also generated.  COMPARISON:  None.  FINDINGS: There is no acute fracture or dislocation. There is severe osteoarthritis of the right glenohumeral joint. There is loss of the normal acromiohumeral distance consistent with a chronic rotator cuff tear. There are severe degenerative changes of the acromioclavicular joint. There is no lytic or  sclerotic osseous lesion. Small glenohumeral joint effusion.  The muscles are normal. There is no soft tissue mass. There is no hematoma.  IMPRESSION: Severe osteoarthritis of the right glenohumeral joint.  Loss of the normal acromiohumeral distance consistent with a chronic rotator cuff tear.   Electronically Signed   By: Kathreen Devoid   On: 09/07/2013 14:12    Disposition: Final discharge disposition not confirmed        Follow-up Information   Follow up with Johnny Bridge, MD. Schedule an appointment as soon as possible for a visit in 2 weeks.   Specialty:  Orthopedic Surgery   Contact  information:   [address] [place] [address] [phone]        Signed: Johnny Bridge 10/04/2013, 2:49 PM

## 2013-10-04 NOTE — Transfer of Care (Signed)
Immediate Anesthesia Transfer of Care Note  Patient: Gavin Maxwell  Procedure(s) Performed: Procedure(s): REVERSE SHOULDER ARTHROPLASTY (Right)  Patient Location: PACU  Anesthesia Type:GA combined with regional for post-op pain  Level of Consciousness: awake, alert  and oriented  Airway & Oxygen Therapy: Patient Spontanous Breathing and Patient connected to nasal cannula oxygen  Post-op Assessment: Report given to PACU RN and Post -op Vital signs reviewed and stable  Post vital signs: Reviewed and stable  Complications: No apparent anesthesia complications

## 2013-10-04 NOTE — Anesthesia Procedure Notes (Addendum)
Procedure Name: Intubation Date/Time: 10/04/2013 10:12 AM Performed by: Raphael Gibney T Pre-anesthesia Checklist: Patient identified, Timeout performed, Emergency Drugs available, Suction available and Patient being monitored Patient Re-evaluated:Patient Re-evaluated prior to inductionOxygen Delivery Method: Circle system utilized and Simple face mask Preoxygenation: Pre-oxygenation with 100% oxygen Intubation Type: IV induction Ventilation: Mask ventilation without difficulty Laryngoscope Size: Miller and 3 Grade View: Grade I Tube type: Oral Tube size: 7.5 mm Number of attempts: 1 Airway Equipment and Method: Patient positioned with wedge pillow and Stylet Placement Confirmation: ETT inserted through vocal cords under direct vision,  positive ETCO2 and breath sounds checked- equal and bilateral Secured at: 23 cm Tube secured with: Tape Dental Injury: Teeth and Oropharynx as per pre-operative assessment    Anesthesia Regional Block:  Interscalene brachial plexus block  Pre-Anesthetic Checklist: ,, timeout performed, Correct Patient, Correct Site, Correct Laterality, Correct Procedure, Correct Position, site marked, Risks and benefits discussed,  Surgical consent,  Pre-op evaluation,  At surgeon's request and post-op pain management  Laterality: Right     Needles:   Needle Type: Stimulator Needle - 40          Additional Needles:  Procedures: Doppler guided and nerve stimulator Interscalene brachial plexus block Narrative:  Start time: 10/04/2013 9:30 AM End time: 10/04/2013 9:45 AM Injection made incrementally with aspirations every 5 mL.  Performed by: Personally  Anesthesiologist: Dr. Oletta Lamas

## 2013-10-04 NOTE — Op Note (Signed)
10/04/2013  12:43 PM  PATIENT:  Gavin Maxwell    PRE-OPERATIVE DIAGNOSIS:  Right rotator cuff arthropathy  POST-OPERATIVE DIAGNOSIS:  Same  PROCEDURE:  REVERSE TOTAL SHOULDER ARTHROPLASTY  SURGEON:  Johnny Bridge, MD  PHYSICIAN ASSISTANT: Joya Gaskins, OPA-C, present and scrubbed throughout the case, critical for completion in a timely fashion, and for retraction, instrumentation, and closure.  ANESTHESIA:   General  PREOPERATIVE INDICATIONS:  Gavin Maxwell is a  68 y.o. male with a diagnosis of right rotator cuff arthropathy who failed conservative measures and elected for surgical management.    The risks benefits and alternatives were discussed with the patient preoperatively including but not limited to the risks of infection, bleeding, nerve injury, cardiopulmonary complications, the need for revision surgery, among others, and the patient was willing to proceed.  OPERATIVE IMPLANTS: Biomet standard baseplate with a size 36 standard glenoid sphere with the offset placed in the C. position inferiorly, with a 25 mm central screw, a 40 mm inferior screw and a 30 mm superior screw, with a size 13 mini press-fit humeral stem and a size 36 mm humeral bearing and +3 mm polyethylene spacer.  OPERATIVE FINDINGS: Advanced rotator cuff arthropathy with erosion of the greater tuberosity, no functional rotator cuff, and subscapularis tissue was present but poor, the acromion was thinned but not fractured, the glenoid had significant superior erosion, the infraspinatus was also in fairly poor condition.  OPERATIVE PROCEDURE: The patient was brought to the operating room and placed in the supine position. General anesthesia was administered. IV antibiotics were given. He was placed in the beach chair position and the right upper extremity prepped and draped in usual sterile fashion. Time out was performed. Deltopectoral approach was performed, and the biceps tendon was degenerated, and nowhere  to be found.  The subdeltoid adhesions were released, and there was a substantial amount of synovitis, and deep retractors were placed. The tissue was reflected off of the humeral shaft with the subscapularis, in order to optimize length for subsequent coverage.  Complete release was performed, I dislocated the head, and then broached sequentially up to a size 13. This had appropriate press-fit scratch field.  I assembled the cutting jig and cut the head at 30 of retroversion, at a satisfactory initial height. I exposed the glenoid, however I did not have adequate access, so I recut the head slightly lower. I then had excellent access to the glenoid, did a circumferential labral release, and placed the guide pin in about 10 of the inferior inclination, then reamed over the guidepin, leaving a inferior ledge of bone from the inclination, and the baseplate was placed nearly flush with the inferior aspect of the glenoid. The anterior and posterior aspects were fairly thin.  I then placed the baseplate, secured it with a 25 mm screw which had excellent compression, and then locked the baseplate in place with superior and inferior locking screws. I did not feel that the anterior screws would be of any benefit because they were so thin, however I had fairly substantial bone to work with superiorly and inferiorly.  I then went to the humeral shaft, and completed the broaching, and then went back to the glenoid sphere and placed the real glenoid sphere with slight inferior offset.  I then placed the trial humeral stem, and reduced this, and the +3 polyethylene provided the appropriate 2 finger tension, and appeared stable.  I drilled 3 holes, to repair the subscapularis into the humeral stem, and then placed  the real implant, followed by a repeat trial, and was satisfied with the tension of the 3 mm, and so I placed the real tray. The shoulder was reduced, taken for a range of motion and was found to be  satisfactory in stability and range of motion. I then repaired the subscapularis, which had fairly substantial amount of tissue directly over the bone, that provided an anterior soft tissue sleeve.  We irrigated the wounds copiously, and then repaired the deltopectoral interval with Vicryl followed by Vicryl and Steri-Strips and sterile gauze for the skin. He was placed in a sling and was awakened and returned back in stable and satisfactory condition. There were no complications and he tolerated the procedure well

## 2013-10-04 NOTE — H&P (Signed)
PREOPERATIVE H&P  Chief Complaint: DJD RIGHT SHOULDER  HPI: Gavin Maxwell is a 68 y.o. male who presents for preoperative history and physical with a diagnosis of DJD RIGHT SHOULDER. Symptoms are rated as moderate to severe, and have been worsening.  This is significantly impairing activities of daily living.  He has elected for surgical management. He has failed injections, activity modification, topical creams, anti-inflammatories, and still has persistent pain. His left upper [place] is effectively nonfunctional do to a previous non-Hodgkin's lymphoma resection, complicated by infection, periprosthetic fracture, radial nerve palsy, etc. He is frustrated because of his severe right upper extremity pain and dysfunction and would like something done about it.  Past Medical History  Diagnosis Date  . Hypertension   . Anxiety   . Depression   . GERD (gastroesophageal reflux disease)   . Cancer     non hodgins  lymphoma  . Sleep apnea     cpap   Past Surgical History  Procedure Laterality Date  . Joint replacement      hip  . Cholecystectomy    . Shoulder acromioplasty      x 5 shoulder surgeries  . Nasal sinus surgery      x2  . Eye surgery     History   Social History  . Marital Status: Married    Spouse Name: N/A    Number of Children: N/A  . Years of Education: N/A   Social History Main Topics  . Smoking status: Former Research scientist (life sciences)  . Smokeless tobacco: None  . Alcohol Use: No  . Drug Use: No  . Sexual Activity: None   Other Topics Concern  . None   Social History Narrative  . None   No family history on file. Allergies  Allergen Reactions  . Augmentin [Amoxicillin-Pot Clavulanate] Nausea And Vomiting  . Celebrex [Celecoxib] Nausea And Vomiting   Prior to Admission medications   Medication Sig Start Date End Date Taking? Authorizing Maicie Vanderloop  aspirin EC 81 MG tablet Take 81 mg by mouth every morning.   Yes Historical Fin Hupp, MD  atorvastatin (LIPITOR) 80 MG  tablet Take 80 mg by mouth daily with supper.   Yes Historical Marionna Gonia, MD  CALCIUM-VITAMIN D PO Take 1 tablet by mouth every morning.   Yes Historical Tywana Robotham, MD  fenofibrate 160 MG tablet Take 160 mg by mouth every morning.   Yes Historical Sofhia Ulibarri, MD  fluticasone (FLONASE) 50 MCG/ACT nasal spray Place 2 sprays into both nostrils at bedtime.   Yes Historical Elise Knobloch, MD  gabapentin (NEURONTIN) 300 MG capsule Take 300 mg by mouth 2 (two) times daily.   Yes Historical Egor Fullilove, MD  IRON PO Take 1 tablet by mouth every morning.   Yes Historical Silus Lanzo, MD  metoprolol succinate (TOPROL-XL) 25 MG 24 hr tablet Take 25 mg by mouth daily with supper.   Yes Historical Nola Botkins, MD  Multiple Vitamin (MULTIVITAMIN WITH MINERALS) TABS tablet Take 1 tablet by mouth every morning.   Yes Historical Bellany Elbaum, MD  naproxen sodium (ALEVE) 220 MG tablet Take 440 mg by mouth 2 (two) times daily with a meal.   Yes Historical Makisha Marrin, MD  pantoprazole (PROTONIX) 40 MG tablet Take 40 mg by mouth every morning.   Yes Historical Dareld Mcauliffe, MD  sertraline (ZOLOFT) 50 MG tablet Take 50 mg by mouth at bedtime.   Yes Historical Tremaine Fuhriman, MD     Positive ROS: All other systems have been reviewed and were otherwise negative with the exception of those mentioned  in the HPI and as above.  Physical Exam: General: Alert, no acute distress Cardiovascular: No pedal edema Respiratory: No cyanosis, no use of accessory musculature GI: No organomegaly, abdomen is soft and non-tender Skin: No lesions in the area of chief complaint Neurologic: Sensation intact distally Psychiatric: Patient is competent for consent with normal mood and affect Lymphatic: No axillary or cervical lymphadenopathy  MUSCULOSKELETAL: Right shoulder is limited with range of motion, 0-90 at most, very weak infraspinatus function.  X-rays demonstrates acetabularization of the undersurface of the acromion with end-stage rotator cuff  arthropathy  Assessment: Right shoulder rotator cuff arthropathy  Plan: Plan for Procedure(s): Right reverse total shoulder replacement  The risks benefits and alternatives were discussed with the patient including but not limited to the risks of nonoperative treatment, versus surgical intervention including infection, bleeding, nerve injury,  blood clots, cardiopulmonary complications, morbidity, mortality, among others, and they were willing to proceed. We also discussed the risks for dislocation, periprosthetic fracture, brachial plexus palsy, among others.  Johnny Bridge, MD Cell [phone]   10/04/2013 9:13 AM

## 2013-10-04 NOTE — Anesthesia Postprocedure Evaluation (Signed)
  Anesthesia Post-op Note  Patient: Gavin Maxwell  Procedure(s) Performed: Procedure(s): REVERSE SHOULDER ARTHROPLASTY (Right)  Patient Location: PACU  Anesthesia Type:General  Level of Consciousness: awake  Airway and Oxygen Therapy: Patient Spontanous Breathing  Post-op Pain: mild  Post-op Assessment: Post-op Vital signs reviewed  Post-op Vital Signs: Reviewed  Last Vitals:  Filed Vitals:   10/04/13 1400  BP: 126/69  Pulse: 81  Temp:   Resp: 12    Complications: No apparent anesthesia complications

## 2013-10-04 NOTE — Discharge Instructions (Signed)
Diet: As you were doing prior to hospitalization  ° °Shower:  May shower but keep the wounds dry, use an occlusive plastic wrap, NO SOAKING IN TUB.  If the bandage gets wet, change with a clean dry gauze. ° °Dressing:  You may change your dressing 3-5 days after surgery.  Then change the dressing daily with sterile gauze dressing.   ° °There are sticky tapes (steri-strips) on your wounds and all the stitches are absorbable.  Leave the steri-strips in place when changing your dressings, they will peel off with time, usually 2-3 weeks. ° °Activity:  Increase activity slowly as tolerated, but follow the weight bearing instructions below.  No lifting or driving for 6 weeks. ° °Weight Bearing:   Sling at all times, no lifting with right arm..   ° °To prevent constipation: you may use a stool softener such as - ° °Colace (over the counter) 100 mg by mouth twice a day  °Drink plenty of fluids (prune juice may be helpful) and high fiber foods °Miralax (over the counter) for constipation as needed.   ° °Itching:  If you experience itching with your medications, try taking only a single pain pill, or even half a pain pill at a time.  You may take up to 10 pain pills per day, and you can also use benadryl over the counter for itching or also to help with sleep.  ° °Precautions:  If you experience chest pain or shortness of breath - call 911 immediately for transfer to the hospital emergency department!! ° °If you develop a fever greater that 101 F, purulent drainage from wound, increased redness or drainage from wound, or calf pain -- Call the office at [phone]                                                °Follow- Up Appointment:  Please call for an appointment to be seen in 2 weeks [place] - [phone] ° ° ° ° ° °

## 2013-10-05 ENCOUNTER — Encounter (HOSPITAL_COMMUNITY): Payer: Self-pay | Admitting: Orthopedic Surgery

## 2013-10-05 LAB — BASIC METABOLIC PANEL
BUN: 20 mg/dL (ref 6–23)
CHLORIDE: 98 meq/L (ref 96–112)
CO2: 24 mEq/L (ref 19–32)
Calcium: 9 mg/dL (ref 8.4–10.5)
Creatinine, Ser: 1.42 mg/dL — ABNORMAL HIGH (ref 0.50–1.35)
GFR calc Af Amer: 56 mL/min — ABNORMAL LOW (ref >90)
GFR, EST NON AFRICAN AMERICAN: 49 mL/min — AB (ref >90)
GLUCOSE: 136 mg/dL — AB (ref 70–99)
Potassium: 5.1 mEq/L (ref 3.7–5.3)
Sodium: 137 mEq/L (ref 137–147)

## 2013-10-05 LAB — CBC
HEMATOCRIT: 35.9 % — AB (ref 39.0–52.0)
Hemoglobin: 12 g/dL — ABNORMAL LOW (ref 13.0–17.0)
MCH: 31.7 pg (ref 26.0–34.0)
MCHC: 33.4 g/dL (ref 30.0–36.0)
MCV: 95 fL (ref 78.0–100.0)
Platelets: 204 10*3/uL (ref 150–400)
RBC: 3.78 MIL/uL — ABNORMAL LOW (ref 4.22–5.81)
RDW: 12.9 % (ref 11.5–15.5)
WBC: 9 10*3/uL (ref 4.0–10.5)

## 2013-10-05 MED ORDER — PNEUMOCOCCAL VAC POLYVALENT 25 MCG/0.5ML IJ INJ
0.5000 mL | INJECTION | Freq: Once | INTRAMUSCULAR | Status: AC
  Administered 2013-10-05: 0.5 mL via INTRAMUSCULAR
  Filled 2013-10-05: qty 0.5

## 2013-10-05 NOTE — Progress Notes (Signed)
Pt spontaneously voided 250 ml.  Patient declined the bladder scan at this time.  Will continue to monitor.

## 2013-10-05 NOTE — Progress Notes (Signed)
PT Cancellation and Discharge Note  Patient Details Name: Gavin Maxwell MRN: [id number] DOB: December 25, 1945   Cancelled Treatment:    Reason Eval/Treat Not Completed: PT screened, no needs identified, will sign off   Teofil Maniaci F 10/05/2013, 10:40 AM

## 2013-10-05 NOTE — Progress Notes (Signed)
Patient ID: [employer], male   DOB: 09-05-1945, 68 y.o.   MRN: [id number]     Subjective:  Patient reports pain as mild.  Patient denies any CP or SOB.  Sitting up in the chair.  Objective:   VITALS:   Filed Vitals:   10/04/13 1507 10/04/13 2040 10/05/13 0041 10/05/13 0515  BP: 121/58 133/84 140/76 128/58  Pulse: 70 95 91 86  Temp: 97.1 F (36.2 C) 97.6 F (36.4 C) 98 F (36.7 C) 98 F (36.7 C)  Resp: 16 16 16 16   Height:      Weight:      SpO2: 95% 98% 98% 100%    ABD soft Sensation intact distally Dorsiflexion/Plantar flexion intact Incision: dressing C/D/I and no drainage Sensation and motor function intact to the hand and wrist  Lab Results  Component Value Date   WBC 9.0 10/05/2013   HGB 12.0* 10/05/2013   HCT 35.9* 10/05/2013   MCV 95.0 10/05/2013   PLT 204 10/05/2013     Assessment/Plan: 1 Day Post-Op   Principal Problem:   Primary localized osteoarthrosis, shoulder region, rotator cuff arthropathy   Advance diet Up with therapy Discharge home per Dr Luanna Cole orders. Sling at all times Dry dressing PRN   Remonia Richter 10/05/2013, 8:43 AM   Marchia Bond, MD Cell [phone]

## 2013-10-05 NOTE — Evaluation (Signed)
Occupational Therapy Evaluation and Discharge Patient Details Name: Gavin Maxwell MRN: [id number] DOB: Mar 08, 1946 Today's Date: 10/05/2013    History of Present Illness s/p R reverse shoulder arthroplasty and left UE is effectively nonfunctional do to a previous non-Hodgkin's lymphoma resection, complicated by infection, periprosthetic fracture, radial nerve palsy, etc.   Clinical Impression   PTA pt required assistance for ADLs due to above and now presents with RUE limited ROM interfering with his independence with self care tasks. Educated pt and wife in UB dressing/bathing sequence and technique, RUE sleeping position, how to don/doff sling and sling wearing schedule, and elbow/wrist/hand AROM exercises. Pt will have 24/7 supervision at home from family and has no concerns about ADLs, therefore no further acute OT is needed. We will sign off.    Follow Up Recommendations  No OT follow up          Precautions / Restrictions Precautions Precautions: Shoulder Shoulder Interventions: Shoulder sling/immobilizer;Off for dressing/bathing/exercises Precaution Booklet Issued: Yes (comment) (given shoulder protocol handout) Restrictions Weight Bearing Restrictions: Yes Other Position/Activity Restrictions: NWB      Mobility Bed Mobility               General bed mobility comments: Pt up in chair upon OT arrival  Transfers Overall transfer level: Needs assistance   Transfers: Sit to/from Stand Sit to Stand: Modified independent (Device/Increase time)              Balance Overall balance assessment: Needs assistance   Sitting balance-Leahy Scale: Good       Standing balance-Leahy Scale: Fair                              ADL                                         General ADL Comments: Pt required assistance for ADLs pta due to BUE deficits but wife is available to assist 24 hours.               Pertinent Vitals/Pain No c/o  pain during tx.     Hand Dominance Right   Extremity/Trunk Assessment Upper Extremity Assessment Upper Extremity Assessment: Generalized weakness;RUE deficits/detail;LUE deficits/detail RUE: Unable to fully assess due to immobilization RUE Coordination: decreased gross motor LUE Deficits / Details: Limited ROM from previous injury with multiple surgeries LUE Coordination: decreased gross motor   Lower Extremity Assessment Lower Extremity Assessment: Overall WFL for tasks assessed   Cervical / Trunk Assessment Cervical / Trunk Assessment: Normal   Communication Communication Communication: No difficulties   Cognition Arousal/Alertness: Awake/alert Behavior During Therapy: WFL for tasks assessed/performed Overall Cognitive Status: Within Functional Limits for tasks assessed                        Exercises   Other Exercises Other Exercises: Instructed pt in elbow/wrist/hand AROM exercises to be done 10 reps/5xday.   Shoulder Instructions Shoulder Instructions Donning/doffing shirt without moving shoulder: Caregiver independent with task Method for sponge bathing under operated UE: Caregiver independent with task Donning/doffing sling/immobilizer: Caregiver independent with task Correct positioning of sling/immobilizer: Caregiver independent with task ROM for elbow, wrist and digits of operated UE: Modified independent Sling wearing schedule (on at all times/off for ADL's): Modified independent Proper positioning of operated UE when showering: Modified independent Positioning  of UE while sleeping: Caregiver independent with task    Home Living Family/patient expects to be discharged to:: Private residence Living Arrangements: Spouse/significant other Available Help at Discharge: Family Type of Home: House       Home Layout: One level     Bathroom Shower/Tub: Occupational psychologist: [place]: Hand held shower head;Bedside  commode          Prior Functioning/Environment Level of Independence: Needs assistance    ADL's / Homemaking Assistance Needed: prior to sx wife helped him with ADLs due to limited ROM in LUE and pain in RUE                 OT Goals(Current goals can be found in the care plan section) Acute Rehab OT Goals Patient Stated Goal: to go home  OT Frequency:      End of Session Nurse Communication:  (ready for d/c from OT standpoint)  Activity Tolerance: Patient tolerated treatment well Patient left: in chair;with call bell/phone within reach;with family/visitor present   Time: 5374-8270 OT Time Calculation (min): 51 min Charges:  OT General Charges $OT Visit: 1 Procedure OT Evaluation $Initial OT Evaluation Tier I: 1 Procedure OT Treatments $Self Care/Home Management : 38-52 mins G-Codes:    Lyda Perone October 31, 2013, 12:09 PM

## 2013-10-05 NOTE — Evaluation (Signed)
Agree with note. [phone] Maryann Conners 10/05/2013 Nestor Lewandowsky, OTR/L Pager: [phone]

## 2013-10-10 NOTE — Progress Notes (Signed)
Agree with above 

## 2013-10-25 ENCOUNTER — Encounter (HOSPITAL_COMMUNITY): Payer: Self-pay | Admitting: Orthopedic Surgery

## 2013-10-25 NOTE — Addendum Note (Signed)
Addendum created 10/25/13 0020 by Finis Bud, MD   Modules edited: Anesthesia Events

## 2014-08-24 ENCOUNTER — Other Ambulatory Visit: Payer: Self-pay | Admitting: Orthopedic Surgery

## 2014-09-14 ENCOUNTER — Encounter (HOSPITAL_COMMUNITY)
Admission: RE | Admit: 2014-09-14 | Discharge: 2014-09-14 | Disposition: A | Discharge status: Home / Self Care | Payer: PPO | Source: Ambulatory Visit | Attending: Orthopedic Surgery | Admitting: Orthopedic Surgery

## 2014-09-14 ENCOUNTER — Encounter (HOSPITAL_COMMUNITY): Payer: Self-pay

## 2014-09-14 DIAGNOSIS — M179 Osteoarthritis of knee, unspecified: Secondary | ICD-10-CM | POA: Diagnosis not present

## 2014-09-14 DIAGNOSIS — Z01812 Encounter for preprocedural laboratory examination: Secondary | ICD-10-CM | POA: Insufficient documentation

## 2014-09-14 DIAGNOSIS — Z01810 Encounter for preprocedural cardiovascular examination: Secondary | ICD-10-CM | POA: Insufficient documentation

## 2014-09-14 HISTORY — DX: Personal history of other diseases of the digestive system: Z87.19

## 2014-09-14 LAB — BASIC METABOLIC PANEL
Anion gap: 7 (ref 5–15)
BUN: 26 mg/dL — ABNORMAL HIGH (ref 6–20)
CHLORIDE: 106 mmol/L (ref 101–111)
CO2: 25 mmol/L (ref 22–32)
Calcium: 9.4 mg/dL (ref 8.9–10.3)
Creatinine, Ser: 1.58 mg/dL — ABNORMAL HIGH (ref 0.61–1.24)
GFR, EST AFRICAN AMERICAN: 49 mL/min — AB (ref >60)
GFR, EST NON AFRICAN AMERICAN: 42 mL/min — AB (ref >60)
Glucose, Bld: 108 mg/dL — ABNORMAL HIGH (ref 65–99)
Potassium: 4.4 mmol/L (ref 3.5–5.1)
Sodium: 138 mmol/L (ref 135–145)

## 2014-09-14 LAB — CBC
HCT: 37.3 % — ABNORMAL LOW (ref 39.0–52.0)
Hemoglobin: 12.9 g/dL — ABNORMAL LOW (ref 13.0–17.0)
MCH: 31.5 pg (ref 26.0–34.0)
MCHC: 34.6 g/dL (ref 30.0–36.0)
MCV: 91.2 fL (ref 78.0–100.0)
Platelets: 188 10*3/uL (ref 150–400)
RBC: 4.09 MIL/uL — ABNORMAL LOW (ref 4.22–5.81)
RDW: 12.7 % (ref 11.5–15.5)
WBC: 4.6 10*3/uL (ref 4.0–10.5)

## 2014-09-14 LAB — SURGICAL PCR SCREEN
MRSA, PCR: NEGATIVE
Staphylococcus aureus: NEGATIVE

## 2014-09-14 NOTE — Pre-Procedure Instructions (Signed)
    Gavin Maxwell  09/14/2014      [id number] - [place], [place] - [address] [place] [address] Phone: [phone] Fax: [phone]  [facility]) [place], [place] [address] Phone: [phone] Fax: [phone]    Your procedure is scheduled on Tuesday, June 21  Report to [facility] Main Entrance "A" at 5:30 A.M.  Call this number if you have problems the morning of surgery:  [phone]              Any questions prior to surgery call pre-admission (Mon.-Fri. 8 am-4 pm) [phone]   Remember:  Do not eat food or drink liquids after midnight.  Take these medicines the morning of surgery with A SIP OF WATER tylenol if needed, flonase, gabapentin (neurontin), metoprolol (toprol-xl), zofran if needed, oxycodone (percocet) if needed, protonix (pantoprazole)              Stop: naproxen sodium (anaprox), meloxicam (mobic), aspirin 7 days prior to surgery.  Don't take any nonsteroidal ant-inflammatory drugs: motrin, advil,ibuprofen, BC's, also.   Do not wear jewelry, make-up or nail polish.  Do not wear lotions, powders, or perfumes.  You may wear deodorant.  Do not shave 48 hours prior to surgery.  Men may shave face and neck.  Do not bring valuables to the hospital.  [facility] is not responsible for any belongings or valuables.  Contacts, dentures or bridgework may not be worn into surgery.  Leave your suitcase in the car.  After surgery it may be brought to your room.  For patients admitted to the hospital, discharge time will be determined by your treatment team.  Patients discharged the day of surgery will not be allowed to drive home.   Name and phone number of your driver:    Special instructions:  Review preparing for surgery handout  Please read over the following fact sheets that you were given.cough/deep breath,Incentive spirometry,  surgical site infections

## 2014-09-14 NOTE — Progress Notes (Addendum)
PCP: Dr. Jacqulynn Cadet sparks at [facility] in [place]  No cardiologist. Stated he has stress test/cath 20 some yrs. Ago due to shortness of breath, studies normal and hasn't had any other studies.   Dr. Lula Olszewski  : [facility] manages his sleep apnea.  Will request study (2 yrs. Ago) and last notes.  Pt. Given Joint Booklet. Didn't want the [facility] handout or to watch it.

## 2014-09-25 MED ORDER — CEFAZOLIN SODIUM-DEXTROSE 2-3 GM-% IV SOLR
2.0000 g | INTRAVENOUS | Status: AC
  Administered 2014-09-26: 2 g via INTRAVENOUS

## 2014-09-26 ENCOUNTER — Inpatient Hospital Stay (HOSPITAL_COMMUNITY): Payer: PPO | Admitting: Certified Registered Nurse Anesthetist

## 2014-09-26 ENCOUNTER — Inpatient Hospital Stay (HOSPITAL_COMMUNITY)
Admission: RE | Admit: 2014-09-26 | Discharge: 2014-09-28 | DRG: 470 | Disposition: A | Discharge status: Home Health | Payer: PPO | Source: Ambulatory Visit | Attending: Orthopedic Surgery | Admitting: Orthopedic Surgery

## 2014-09-26 ENCOUNTER — Encounter (HOSPITAL_COMMUNITY): Payer: Self-pay | Admitting: *Deleted

## 2014-09-26 ENCOUNTER — Encounter (HOSPITAL_COMMUNITY)
Admission: RE | Disposition: A | Discharge status: Home Health | Payer: Self-pay | Source: Ambulatory Visit | Attending: Orthopedic Surgery

## 2014-09-26 ENCOUNTER — Inpatient Hospital Stay (HOSPITAL_COMMUNITY): Payer: PPO

## 2014-09-26 DIAGNOSIS — Z96651 Presence of right artificial knee joint: Secondary | ICD-10-CM

## 2014-09-26 DIAGNOSIS — F419 Anxiety disorder, unspecified: Secondary | ICD-10-CM | POA: Diagnosis present

## 2014-09-26 DIAGNOSIS — Z791 Long term (current) use of non-steroidal anti-inflammatories (NSAID): Secondary | ICD-10-CM | POA: Diagnosis not present

## 2014-09-26 DIAGNOSIS — M1711 Unilateral primary osteoarthritis, right knee: Principal | ICD-10-CM | POA: Diagnosis present

## 2014-09-26 DIAGNOSIS — F329 Major depressive disorder, single episode, unspecified: Secondary | ICD-10-CM | POA: Diagnosis present

## 2014-09-26 DIAGNOSIS — M1710 Unilateral primary osteoarthritis, unspecified knee: Secondary | ICD-10-CM | POA: Diagnosis present

## 2014-09-26 DIAGNOSIS — Z96611 Presence of right artificial shoulder joint: Secondary | ICD-10-CM | POA: Diagnosis present

## 2014-09-26 DIAGNOSIS — I10 Essential (primary) hypertension: Secondary | ICD-10-CM | POA: Diagnosis present

## 2014-09-26 DIAGNOSIS — K219 Gastro-esophageal reflux disease without esophagitis: Secondary | ICD-10-CM | POA: Diagnosis present

## 2014-09-26 DIAGNOSIS — Z96649 Presence of unspecified artificial hip joint: Secondary | ICD-10-CM | POA: Diagnosis present

## 2014-09-26 DIAGNOSIS — G4730 Sleep apnea, unspecified: Secondary | ICD-10-CM | POA: Diagnosis present

## 2014-09-26 DIAGNOSIS — Z888 Allergy status to other drugs, medicaments and biological substances status: Secondary | ICD-10-CM | POA: Diagnosis not present

## 2014-09-26 DIAGNOSIS — M179 Osteoarthritis of knee, unspecified: Secondary | ICD-10-CM | POA: Diagnosis present

## 2014-09-26 DIAGNOSIS — Z7982 Long term (current) use of aspirin: Secondary | ICD-10-CM

## 2014-09-26 DIAGNOSIS — Z881 Allergy status to other antibiotic agents status: Secondary | ICD-10-CM

## 2014-09-26 DIAGNOSIS — Z87891 Personal history of nicotine dependence: Secondary | ICD-10-CM | POA: Diagnosis not present

## 2014-09-26 DIAGNOSIS — Z79891 Long term (current) use of opiate analgesic: Secondary | ICD-10-CM | POA: Diagnosis not present

## 2014-09-26 DIAGNOSIS — M1990 Unspecified osteoarthritis, unspecified site: Secondary | ICD-10-CM | POA: Diagnosis present

## 2014-09-26 HISTORY — DX: Unilateral primary osteoarthritis, unspecified knee: M17.10

## 2014-09-26 HISTORY — PX: TOTAL KNEE ARTHROPLASTY: SHX125

## 2014-09-26 SURGERY — ARTHROPLASTY, KNEE, TOTAL
Anesthesia: Spinal | Site: Knee | Laterality: Right

## 2014-09-26 MED ORDER — ASPIRIN EC 81 MG PO TBEC
81.0000 mg | DELAYED_RELEASE_TABLET | Freq: Every morning | ORAL | Status: DC
  Administered 2014-09-26 – 2014-09-28 (×3): 81 mg via ORAL
  Filled 2014-09-26 (×3): qty 1

## 2014-09-26 MED ORDER — HYDROCODONE-ACETAMINOPHEN 10-325 MG PO TABS
1.0000 | ORAL_TABLET | ORAL | Status: DC | PRN
  Administered 2014-09-26 – 2014-09-28 (×7): 2 via ORAL
  Filled 2014-09-26 (×7): qty 2

## 2014-09-26 MED ORDER — ALUM & MAG HYDROXIDE-SIMETH 200-200-20 MG/5ML PO SUSP: 30.0000 mL | ORAL | Status: DC | PRN

## 2014-09-26 MED ORDER — ATORVASTATIN CALCIUM 80 MG PO TABS
80.0000 mg | ORAL_TABLET | Freq: Every day | ORAL | Status: DC
  Administered 2014-09-26 – 2014-09-27 (×2): 80 mg via ORAL
  Filled 2014-09-26 (×2): qty 1

## 2014-09-26 MED ORDER — FENTANYL CITRATE (PF) 100 MCG/2ML IJ SOLN
INTRAMUSCULAR | Status: DC | PRN
  Administered 2014-09-26 (×2): 25 ug via INTRAVENOUS

## 2014-09-26 MED ORDER — CEFAZOLIN SODIUM-DEXTROSE 2-3 GM-% IV SOLR
2.0000 g | Freq: Four times a day (QID) | INTRAVENOUS | Status: AC
  Administered 2014-09-26 (×2): 2 g via INTRAVENOUS
  Filled 2014-09-26 (×3): qty 50

## 2014-09-26 MED ORDER — DIPHENHYDRAMINE HCL 12.5 MG/5ML PO ELIX: 12.5000 mg | ORAL_SOLUTION | ORAL | Status: DC | PRN

## 2014-09-26 MED ORDER — ONDANSETRON HCL 4 MG PO TABS: 4.0000 mg | ORAL_TABLET | Freq: Three times a day (TID) | ORAL | Status: DC | PRN

## 2014-09-26 MED ORDER — KETAMINE HCL 100 MG/ML IJ SOLN
INTRAMUSCULAR | Status: DC | PRN
  Administered 2014-09-26: 30 mg via INTRAVENOUS

## 2014-09-26 MED ORDER — 0.9 % SODIUM CHLORIDE (POUR BTL) OPTIME
TOPICAL | Status: DC | PRN
Start: 2014-09-26 — End: 2014-09-26
  Administered 2014-09-26: 1000 mL

## 2014-09-26 MED ORDER — SENNA 8.6 MG PO TABS
1.0000 | ORAL_TABLET | Freq: Two times a day (BID) | ORAL | Status: DC
  Administered 2014-09-26 – 2014-09-28 (×5): 8.6 mg via ORAL
  Filled 2014-09-26 (×5): qty 1

## 2014-09-26 MED ORDER — FLUTICASONE PROPIONATE 50 MCG/ACT NA SUSP
2.0000 | Freq: Every day | NASAL | Status: DC
  Administered 2014-09-27 – 2014-09-28 (×2): 2 via NASAL
  Filled 2014-09-26: qty 16

## 2014-09-26 MED ORDER — BUPIVACAINE HCL (PF) 0.25 % IJ SOLN
INTRAMUSCULAR | Status: DC | PRN
  Administered 2014-09-26: 10 mL

## 2014-09-26 MED ORDER — BUPIVACAINE HCL (PF) 0.25 % IJ SOLN
INTRAMUSCULAR | Status: AC
  Filled 2014-09-26: qty 30

## 2014-09-26 MED ORDER — SODIUM CHLORIDE 0.9 % IR SOLN
Status: DC | PRN
  Administered 2014-09-26: 1

## 2014-09-26 MED ORDER — PHENOL 1.4 % MT LIQD: 1.0000 | OROMUCOSAL | Status: DC | PRN

## 2014-09-26 MED ORDER — FENOFIBRATE 160 MG PO TABS
160.0000 mg | ORAL_TABLET | Freq: Every morning | ORAL | Status: DC
  Administered 2014-09-26 – 2014-09-28 (×3): 160 mg via ORAL
  Filled 2014-09-26 (×3): qty 1

## 2014-09-26 MED ORDER — PHENYLEPHRINE HCL 10 MG/ML IJ SOLN
10.0000 mg | INTRAVENOUS | Status: DC | PRN
  Administered 2014-09-26: 10 ug/min via INTRAVENOUS

## 2014-09-26 MED ORDER — BUPIVACAINE HCL (PF) 0.75 % IJ SOLN
INTRAMUSCULAR | Status: DC | PRN
  Administered 2014-09-26: 15 mg via INTRATHECAL

## 2014-09-26 MED ORDER — MAGNESIUM CITRATE PO SOLN: 1.0000 | Freq: Once | ORAL | Status: AC | PRN

## 2014-09-26 MED ORDER — POLYETHYLENE GLYCOL 3350 17 G PO PACK: 17.0000 g | PACK | Freq: Every day | ORAL | Status: DC | PRN

## 2014-09-26 MED ORDER — BISACODYL 10 MG RE SUPP: 10.0000 mg | Freq: Every day | RECTAL | Status: DC | PRN

## 2014-09-26 MED ORDER — BUPIVACAINE LIPOSOME 1.3 % IJ SUSP
20.0000 mL | INTRAMUSCULAR | Status: AC
  Administered 2014-09-26: 10 mL
  Filled 2014-09-26: qty 20

## 2014-09-26 MED ORDER — CALCIUM CARBONATE 600 MG PO TABS
600.0000 mg | ORAL_TABLET | Freq: Every day | ORAL | Status: DC
  Filled 2014-09-26: qty 1

## 2014-09-26 MED ORDER — KETOROLAC TROMETHAMINE 15 MG/ML IJ SOLN
7.5000 mg | Freq: Four times a day (QID) | INTRAMUSCULAR | Status: AC
  Administered 2014-09-26 – 2014-09-27 (×4): 7.5 mg via INTRAVENOUS
  Filled 2014-09-26 (×4): qty 1

## 2014-09-26 MED ORDER — PROPOFOL INFUSION 10 MG/ML OPTIME
INTRAVENOUS | Status: DC | PRN
  Administered 2014-09-26: 50 ug/kg/min via INTRAVENOUS

## 2014-09-26 MED ORDER — DOCUSATE SODIUM 100 MG PO CAPS
100.0000 mg | ORAL_CAPSULE | Freq: Two times a day (BID) | ORAL | Status: DC
  Administered 2014-09-26 – 2014-09-28 (×5): 100 mg via ORAL
  Filled 2014-09-26 (×5): qty 1

## 2014-09-26 MED ORDER — MIDAZOLAM HCL 2 MG/2ML IJ SOLN
INTRAMUSCULAR | Status: AC
  Filled 2014-09-26: qty 2

## 2014-09-26 MED ORDER — ACETAMINOPHEN 325 MG PO TABS: 650.0000 mg | ORAL_TABLET | Freq: Four times a day (QID) | ORAL | Status: DC | PRN

## 2014-09-26 MED ORDER — PANTOPRAZOLE SODIUM 40 MG PO TBEC
40.0000 mg | DELAYED_RELEASE_TABLET | Freq: Every day | ORAL | Status: DC
  Administered 2014-09-26 – 2014-09-28 (×3): 40 mg via ORAL
  Filled 2014-09-26 (×3): qty 1

## 2014-09-26 MED ORDER — LACTATED RINGERS IV SOLN
INTRAVENOUS | Status: DC | PRN
  Administered 2014-09-26 (×2): via INTRAVENOUS

## 2014-09-26 MED ORDER — RIVAROXABAN 10 MG PO TABS
10.0000 mg | ORAL_TABLET | Freq: Every day | ORAL | Status: DC
  Administered 2014-09-27 – 2014-09-28 (×2): 10 mg via ORAL
  Filled 2014-09-26 (×2): qty 1

## 2014-09-26 MED ORDER — SENNA-DOCUSATE SODIUM 8.6-50 MG PO TABS: 2.0000 | ORAL_TABLET | Freq: Every day | ORAL | Status: DC

## 2014-09-26 MED ORDER — METOCLOPRAMIDE HCL 5 MG PO TABS: 5.0000 mg | ORAL_TABLET | Freq: Three times a day (TID) | ORAL | Status: DC | PRN

## 2014-09-26 MED ORDER — HYDROCODONE-ACETAMINOPHEN 10-325 MG PO TABS: 1.0000 | ORAL_TABLET | Freq: Four times a day (QID) | ORAL | Status: DC | PRN

## 2014-09-26 MED ORDER — ACETAMINOPHEN 650 MG RE SUPP: 650.0000 mg | Freq: Four times a day (QID) | RECTAL | Status: DC | PRN

## 2014-09-26 MED ORDER — POTASSIUM CHLORIDE IN NACL 20-0.45 MEQ/L-% IV SOLN
INTRAVENOUS | Status: DC
  Administered 2014-09-26: 13:00:00 via INTRAVENOUS
  Filled 2014-09-26 (×5): qty 1000

## 2014-09-26 MED ORDER — ONDANSETRON HCL 4 MG PO TABS: 4.0000 mg | ORAL_TABLET | Freq: Four times a day (QID) | ORAL | Status: DC | PRN

## 2014-09-26 MED ORDER — MIDAZOLAM HCL 5 MG/5ML IJ SOLN
INTRAMUSCULAR | Status: DC | PRN
  Administered 2014-09-26: 2 mg via INTRAVENOUS

## 2014-09-26 MED ORDER — GABAPENTIN 300 MG PO CAPS
300.0000 mg | ORAL_CAPSULE | Freq: Two times a day (BID) | ORAL | Status: DC
  Administered 2014-09-26 – 2014-09-28 (×4): 300 mg via ORAL
  Filled 2014-09-26 (×4): qty 1

## 2014-09-26 MED ORDER — FERROUS SULFATE 325 (65 FE) MG PO TABS
325.0000 mg | ORAL_TABLET | Freq: Every day | ORAL | Status: DC
Start: 2014-09-26 — End: 2014-09-28
  Administered 2014-09-26 – 2014-09-28 (×3): 325 mg via ORAL
  Filled 2014-09-26 (×3): qty 1

## 2014-09-26 MED ORDER — METHOCARBAMOL 1000 MG/10ML IJ SOLN
500.0000 mg | Freq: Four times a day (QID) | INTRAVENOUS | Status: DC | PRN
  Filled 2014-09-26: qty 5

## 2014-09-26 MED ORDER — PROPOFOL 10 MG/ML IV BOLUS
INTRAVENOUS | Status: DC | PRN
  Administered 2014-09-26: 20 mg via INTRAVENOUS

## 2014-09-26 MED ORDER — HYDROMORPHONE HCL 1 MG/ML IJ SOLN
0.5000 mg | INTRAMUSCULAR | Status: DC | PRN
  Administered 2014-09-26 (×2): 0.5 mg via INTRAVENOUS
  Filled 2014-09-26 (×2): qty 1

## 2014-09-26 MED ORDER — CALCIUM CARBONATE 1250 (500 CA) MG PO TABS
1.0000 | ORAL_TABLET | Freq: Every day | ORAL | Status: DC
  Administered 2014-09-27 – 2014-09-28 (×2): 500 mg via ORAL
  Filled 2014-09-26 (×2): qty 1

## 2014-09-26 MED ORDER — MENTHOL 3 MG MT LOZG: 1.0000 | LOZENGE | OROMUCOSAL | Status: DC | PRN

## 2014-09-26 MED ORDER — BACLOFEN 10 MG PO TABS: 10.0000 mg | ORAL_TABLET | Freq: Three times a day (TID) | ORAL | Status: DC

## 2014-09-26 MED ORDER — SERTRALINE HCL 50 MG PO TABS
50.0000 mg | ORAL_TABLET | Freq: Every day | ORAL | Status: DC
  Administered 2014-09-26 – 2014-09-27 (×2): 50 mg via ORAL
  Filled 2014-09-26 (×2): qty 1

## 2014-09-26 MED ORDER — KETAMINE HCL 100 MG/ML IJ SOLN
INTRAMUSCULAR | Status: AC
  Filled 2014-09-26: qty 1

## 2014-09-26 MED ORDER — METHOCARBAMOL 500 MG PO TABS
500.0000 mg | ORAL_TABLET | Freq: Four times a day (QID) | ORAL | Status: DC | PRN
  Administered 2014-09-26 – 2014-09-28 (×6): 500 mg via ORAL
  Filled 2014-09-26 (×6): qty 1

## 2014-09-26 MED ORDER — METOPROLOL SUCCINATE ER 25 MG PO TB24
25.0000 mg | ORAL_TABLET | Freq: Every evening | ORAL | Status: DC
  Administered 2014-09-26 – 2014-09-27 (×2): 25 mg via ORAL
  Filled 2014-09-26 (×2): qty 1

## 2014-09-26 MED ORDER — PROPOFOL 10 MG/ML IV BOLUS
INTRAVENOUS | Status: AC
  Filled 2014-09-26: qty 20

## 2014-09-26 MED ORDER — METOCLOPRAMIDE HCL 5 MG/ML IJ SOLN: 5.0000 mg | Freq: Three times a day (TID) | INTRAMUSCULAR | Status: DC | PRN

## 2014-09-26 MED ORDER — ONDANSETRON HCL 4 MG/2ML IJ SOLN: 4.0000 mg | Freq: Four times a day (QID) | INTRAMUSCULAR | Status: DC | PRN

## 2014-09-26 MED ORDER — FENTANYL CITRATE (PF) 250 MCG/5ML IJ SOLN
INTRAMUSCULAR | Status: AC
  Filled 2014-09-26: qty 5

## 2014-09-26 SURGICAL SUPPLY — 63 items
BANDAGE ELASTIC 6 VELCRO ST LF (GAUZE/BANDAGES/DRESSINGS) ×3 IMPLANT
BANDAGE ESMARK 6X9 LF (GAUZE/BANDAGES/DRESSINGS) ×1 IMPLANT
BENZOIN TINCTURE PRP APPL 2/3 (GAUZE/BANDAGES/DRESSINGS) ×3 IMPLANT
BLADE SAG 18X100X1.27 (BLADE) ×3 IMPLANT
BLADE SAW RECIP 87.9 MT (BLADE) ×3 IMPLANT
BLADE SAW SGTL 13X75X1.27 (BLADE) ×3 IMPLANT
BNDG ESMARK 6X9 LF (GAUZE/BANDAGES/DRESSINGS) ×3
BOOTCOVER CLEANROOM LRG (PROTECTIVE WEAR) ×6 IMPLANT
BOWL SMART MIX CTS (DISPOSABLE) ×3 IMPLANT
CAP KNEE TOTAL 3 SIGMA ×3 IMPLANT
CEMENT HV SMART SET (Cement) ×6 IMPLANT
CLOSURE STERI-STRIP 1/2X4 (GAUZE/BANDAGES/DRESSINGS) ×1
CLSR STERI-STRIP ANTIMIC 1/2X4 (GAUZE/BANDAGES/DRESSINGS) ×2 IMPLANT
COVER SURGICAL LIGHT HANDLE (MISCELLANEOUS) ×3 IMPLANT
CUFF TOURNIQUET SINGLE 34IN LL (TOURNIQUET CUFF) ×3 IMPLANT
DRAPE EXTREMITY T 121X128X90 (DRAPE) ×3 IMPLANT
DRAPE IMP U-DRAPE 54X76 (DRAPES) ×3 IMPLANT
DRAPE U-SHAPE 47X51 STRL (DRAPES) ×3 IMPLANT
DURAPREP 26ML APPLICATOR (WOUND CARE) ×3 IMPLANT
ELECT CAUTERY BLADE 6.4 (BLADE) ×3 IMPLANT
ELECT REM PT RETURN 9FT ADLT (ELECTROSURGICAL) ×3
ELECTRODE REM PT RTRN 9FT ADLT (ELECTROSURGICAL) ×1 IMPLANT
FACESHIELD STD STERILE (MASK) ×6 IMPLANT
GAUZE SPONGE 4X4 12PLY STRL (GAUZE/BANDAGES/DRESSINGS) ×3 IMPLANT
GLOVE BIOGEL PI IND STRL 8 (GLOVE) ×1 IMPLANT
GLOVE BIOGEL PI INDICATOR 8 (GLOVE) ×2
GLOVE BIOGEL PI ORTHO PRO SZ8 (GLOVE) ×2
GLOVE ORTHO TXT STRL SZ7.5 (GLOVE) ×3 IMPLANT
GLOVE PI ORTHO PRO STRL SZ8 (GLOVE) ×1 IMPLANT
GLOVE SURG ORTHO 8.0 STRL STRW (GLOVE) ×3 IMPLANT
GOWN STRL REUS W/ TWL XL LVL3 (GOWN DISPOSABLE) ×1 IMPLANT
GOWN STRL REUS W/TWL 2XL LVL3 (GOWN DISPOSABLE) ×3 IMPLANT
GOWN STRL REUS W/TWL XL LVL3 (GOWN DISPOSABLE) ×2
HANDPIECE INTERPULSE COAX TIP (DISPOSABLE) ×2
HOOD PEEL AWAY FACE SHEILD DIS (HOOD) ×6 IMPLANT
IMMOBILIZER KNEE 22 (SOFTGOODS) ×3 IMPLANT
KIT BASIN OR (CUSTOM PROCEDURE TRAY) ×3 IMPLANT
KIT ROOM TURNOVER OR (KITS) ×3 IMPLANT
MANIFOLD NEPTUNE II (INSTRUMENTS) ×3 IMPLANT
NEEDLE 18GX1X1/2 (RX/OR ONLY) (NEEDLE) ×3 IMPLANT
NS IRRIG 1000ML POUR BTL (IV SOLUTION) ×3 IMPLANT
PACK TOTAL JOINT (CUSTOM PROCEDURE TRAY) ×3 IMPLANT
PACK UNIVERSAL I (CUSTOM PROCEDURE TRAY) ×3 IMPLANT
PAD ABD 8X10 STRL (GAUZE/BANDAGES/DRESSINGS) ×3 IMPLANT
PAD ARMBOARD 7.5X6 YLW CONV (MISCELLANEOUS) ×6 IMPLANT
PAD CAST 4YDX4 CTTN HI CHSV (CAST SUPPLIES) ×1 IMPLANT
PADDING CAST COTTON 4X4 STRL (CAST SUPPLIES) ×2
PADDING CAST COTTON 6X4 STRL (CAST SUPPLIES) ×3 IMPLANT
SET HNDPC FAN SPRY TIP SCT (DISPOSABLE) ×1 IMPLANT
SPONGE GAUZE 4X4 12PLY STER LF (GAUZE/BANDAGES/DRESSINGS) ×3 IMPLANT
SUCTION FRAZIER TIP 10 FR DISP (SUCTIONS) ×3 IMPLANT
SUT MNCRL AB 4-0 PS2 18 (SUTURE) IMPLANT
SUT VIC AB 0 CT1 27 (SUTURE) ×2
SUT VIC AB 0 CT1 27XBRD ANBCTR (SUTURE) ×1 IMPLANT
SUT VIC AB 2-0 CT1 27 (SUTURE) ×2
SUT VIC AB 2-0 CT1 TAPERPNT 27 (SUTURE) ×1 IMPLANT
SUT VIC AB 3-0 SH 8-18 (SUTURE) ×6 IMPLANT
SYR 30ML LL (SYRINGE) IMPLANT
SYR 50ML LL SCALE MARK (SYRINGE) ×3 IMPLANT
TOWEL OR 17X24 6PK STRL BLUE (TOWEL DISPOSABLE) ×3 IMPLANT
TOWEL OR 17X26 10 PK STRL BLUE (TOWEL DISPOSABLE) ×3 IMPLANT
TRAY CATH 16FR W/PLASTIC CATH (SET/KITS/TRAYS/PACK) IMPLANT
WATER STERILE IRR 1000ML POUR (IV SOLUTION) ×6 IMPLANT

## 2014-09-26 NOTE — Care Management (Signed)
Utilization review completed by Kayleah Appleyard N. Aldous Housel, RN BSN 

## 2014-09-26 NOTE — Transfer of Care (Signed)
Immediate Anesthesia Transfer of Care Note  Patient: Gavin Maxwell  Procedure(s) Performed: Procedure(s): RIGHT TOTAL KNEE ARTHROPLASTY (Right)  Patient Location: PACU  Anesthesia Type:MAC and Spinal  Level of Consciousness: awake, alert  and oriented  Airway & Oxygen Therapy: Patient Spontanous Breathing  Post-op Assessment: Report given to RN and Post -op Vital signs reviewed and stable. All questions answered.  Post vital signs: Reviewed and stable  Last Vitals:  Filed Vitals:   09/26/14 0607  BP: 121/65  Pulse: 70  Temp: 36.3 C  Resp: 18    Complications: No apparent anesthesia complications

## 2014-09-26 NOTE — Op Note (Signed)
DATE OF SURGERY:  09/26/2014 TIME: 9:32 AM  PATIENT NAME:  Gavin Maxwell   AGE: 69 y.o.    PRE-OPERATIVE DIAGNOSIS:  DJD RIGHT KNEE  POST-OPERATIVE DIAGNOSIS:  Same  PROCEDURE:  Procedure(s): RIGHT TOTAL KNEE ARTHROPLASTY   SURGEON:  Johnny Bridge, MD   ASSISTANT:  Joya Gaskins, OPA-C, present and scrubbed throughout the case, critical for assistance with exposure, retraction, instrumentation, and closure.  Second assistant: Laure Kidney, RNFA  OPERATIVE IMPLANTS: Depuy PFC Sigma, Posterior Stabilized.  Femur size 5, Tibia size 5, Patella size 41 3-peg oval button, with a 10 mm polyethylene insert.   PREOPERATIVE INDICATIONS:  Gavin Maxwell is a 69 y.o. year old male with end stage bone on bone degenerative arthritis of the knee who failed conservative treatment, including injections, antiinflammatories, activity modification, and assistive devices, and had significant impairment of their activities of daily living, and elected for Total Knee Arthroplasty.   The risks, benefits, and alternatives were discussed at length including but not limited to the risks of infection, bleeding, nerve injury, stiffness, blood clots, the need for revision surgery, cardiopulmonary complications, among others, and they were willing to proceed.  OPERATIVE FINDINGS AND UNIQUE ASPECTS OF THE CASE:  The knee had a moderate valgus alignment, although there was not a lot of hypoplasia of the lateral femoral condyle. The patella did not like tracking neutrally, I suspect secondary to the valgus disease. The bone was extremely sclerotic and very hard.  OPERATIVE DESCRIPTION:  The patient was brought to the operative room and placed in a supine position.  General anesthesia was administered.  IV antibiotics were given.  The lower extremity was prepped and draped in the usual sterile fashion.  Time out was performed.  The leg was elevated and exsanguinated and the tourniquet was inflated.  Anterior  quadriceps tendon splitting approach was performed.  The patella was everted and osteophytes were removed.  The anterior horn of the medial and lateral meniscus was removed.   The distal femur was opened with the drill and the intramedullary distal femoral cutting jig was utilized, set at 5 degrees resecting 10 mm off the distal femur.  Care was taken to protect the collateral ligaments.  Then the extramedullary tibial cutting jig was utilized making the appropriate cut using the anterior tibial crest as a reference building in appropriate posterior slope.  Care was taken during the cut to protect the medial and collateral ligaments.  The proximal tibia was removed along with the posterior horns of the menisci.  The PCL was sacrificed.    The extensor gap was measured and was approximately 17mm.    The distal femoral sizing jig was applied, taking care to avoid notching.  Then the 4-in-1 cutting jig was applied and the anterior and posterior femur was cut, along with the chamfer cuts.  All posterior osteophytes were removed.  The flexion gap was then measured and was symmetric with the extension gap.  I completed the distal femoral preparation using the appropriate jig to prepare the box.  The patella was then measured, and cut with the saw.  The thickness before the cut was 25.5 and after the cut was 15.  The proximal tibia sized and prepared accordingly with the reamer and the punch, and then all components were trialed with the 57mm poly insert.  The knee was found to have excellent balance and full motion.    The above named components were then cemented into place and all excess cement was removed.  The real polyethylene implant was placed.  After the cement had cured I released the tourniquet and confirmed excellent hemostasis with no major posterior vessel injury.    The knee was easily taken through a range of motion and the patella tracked well and the knee irrigated copiously and the  parapatellar and subcutaneous tissue closed with vicryl, and monocryl with steri strips for the skin.  The wounds were injected with marcaine, and dressed with sterile gauze and the patient was awakened and returned to the PACU in stable and satisfactory condition.  There were no complications.  Total tourniquet time was 80 minutes.

## 2014-09-26 NOTE — Anesthesia Preprocedure Evaluation (Addendum)
Anesthesia Evaluation  Patient identified by MRN, date of birth, ID band Patient awake    Reviewed: Allergy & Precautions, Patient's Chart, lab work & pertinent test results  History of Anesthesia Complications Negative for: history of anesthetic complications  Airway Mallampati: II       Dental   Pulmonary sleep apnea , former smoker,    Pulmonary exam normal       Cardiovascular hypertension, Rhythm:Regular Rate:Normal     Neuro/Psych    GI/Hepatic hiatal hernia, GERD-  ,  Endo/Other  Morbid obesity  Renal/GU Renal InsufficiencyRenal disease     Musculoskeletal  (+) Arthritis -,   Abdominal   Peds  Hematology   Anesthesia Other Findings   Reproductive/Obstetrics                          Anesthesia Physical Anesthesia Plan  ASA: II  Anesthesia Plan: Spinal   Post-op Pain Management:    Induction: Intravenous  Airway Management Planned: Natural Airway and Simple Face Mask  Additional Equipment:   Intra-op Plan:   Post-operative Plan:   Informed Consent: I have reviewed the patients History and Physical, chart, labs and discussed the procedure including the risks, benefits and alternatives for the proposed anesthesia with the patient or authorized representative who has indicated his/her understanding and acceptance.     Plan Discussed with: CRNA and Surgeon  Anesthesia Plan Comments:        Anesthesia Quick Evaluation

## 2014-09-26 NOTE — Evaluation (Signed)
Physical Therapy Evaluation Patient Details Name: Gavin Maxwell MRN: [id number] DOB: Aug 10, 1945 Today's Date: 09/26/2014   History of Present Illness  Pt is a 69 y/o M s/p R TKA.  Pt's PMH inlcudes HTN, anxiety and depression, cancer, L THA, 5 shoulder surgeries, R reverse total shoulder.  Clinical Impression  Pt is s/p R TKA resulting in the deficits listed below (see PT Problem List). Session limited 2/2 spinal nerve block.   Pt will benefit from skilled PT to increase their independence and safety with mobility to allow discharge to the venue listed below.     Follow Up Recommendations Home health PT;Supervision for mobility/OOB    Equipment Recommendations  Rolling walker with 5" wheels    Recommendations for Other Services OT consult     Precautions / Restrictions Precautions Precautions: Knee;Fall Precaution Booklet Issued: Yes (comment) Precaution Comments: Reviewed no pillow under knee Required Braces or Orthoses: Knee Immobilizer - Right Knee Immobilizer - Right: On when out of bed or walking Restrictions Weight Bearing Restrictions: Yes RLE Weight Bearing: Weight bearing as tolerated      Mobility  Bed Mobility Overal bed mobility: Needs Assistance Bed Mobility: Supine to Sit;Rolling Rolling: Min assist   Supine to sit: Min assist     General bed mobility comments: Pt had soiled bed and required min assist w/ rolling B to maintain sidelying position.Min assist managing RLE OOB and VCs for hand placement and technique.  Increased time.    Transfers Overall transfer level: Needs assistance Equipment used: Rolling walker (2 wheeled) Transfers: Sit to/from Stand Sit to Stand: From elevated surface;Max assist         General transfer comment: Max assist w/ attempted sit>stand transfer which was unsuccesful 2/2 spinal block.  Pt able to achieve squat position but required max assist for stabilization and return to sitting EOB.  Ambulation/Gait                 Stairs            Wheelchair Mobility    Modified Rankin (Stroke Patients Only)       Balance Overall balance assessment: Needs assistance Sitting-balance support: Bilateral upper extremity supported;Feet supported Sitting balance-Leahy Scale: Poor     Standing balance support: Bilateral upper extremity supported;During functional activity Standing balance-Leahy Scale: Zero                               Pertinent Vitals/Pain Pain Assessment: 0-10 Pain Score: 2  Pain Location: R knee Pain Descriptors / Indicators: Aching Pain Intervention(s): Limited activity within patient's tolerance;Monitored during session;Repositioned    Home Living Family/patient expects to be discharged to:: Private residence Living Arrangements: Spouse/significant other;Children Available Help at Discharge: Family;Available 24 hours/day Type of Home: House Home Access: Stairs to enter Entrance Stairs-Rails: None Entrance Stairs-Number of Steps: 1 Home Layout: One level Home Equipment: Cane - single point      Prior Function Level of Independence: Independent with assistive device(s)         Comments: Cane for long distance.  Says his R knee used to "give way" w/ long distance walking     Hand Dominance        Extremity/Trunk Assessment   Upper Extremity Assessment: LUE deficits/detail       LUE Deficits / Details: Pt wears LUE orthotic which he says is 2/2 h/o L shoulder replacement w/ fx 2/2 cancer.  Pt reports he has been  told by his doctor that he can WBAT through LUE and does not have any other restrictions.   Lower Extremity Assessment: RLE deficits/detail;LLE deficits/detail RLE Deficits / Details: weaknes and limited ROM as expected s/p R TKA       Communication   Communication: No difficulties  Cognition Arousal/Alertness: Awake/alert Behavior During Therapy: WFL for tasks assessed/performed Overall Cognitive Status: Within Functional  Limits for tasks assessed                      General Comments      Exercises Total Joint Exercises Ankle Circles/Pumps: AROM;Both;15 reps;Supine Quad Sets: AROM;Both;10 reps;Supine Heel Slides: AROM;Right;5 reps;Supine      Assessment/Plan    PT Assessment Patient needs continued PT services  PT Diagnosis Difficulty walking;Abnormality of gait;Generalized weakness;Acute pain   PT Problem List Decreased strength;Decreased range of motion;Decreased activity tolerance;Decreased mobility;Decreased balance;Decreased coordination;Decreased knowledge of use of DME;Decreased safety awareness;Decreased knowledge of precautions;Impaired sensation;Obesity;Decreased skin integrity;Pain  PT Treatment Interventions DME instruction;Gait training;Stair training;Functional mobility training;Therapeutic activities;Therapeutic exercise;Balance training;Neuromuscular re-education;Patient/family education;Modalities   PT Goals (Current goals can be found in the Care Plan section) Acute Rehab PT Goals Patient Stated Goal: to be able to walk PT Goal Formulation: With patient/family Time For Goal Achievement: 10/03/14 Potential to Achieve Goals: Good    Frequency 7X/week   Barriers to discharge Inaccessible home environment 1 step to enter home w/o rails    Co-evaluation               End of Session Equipment Utilized During Treatment: Gait belt;Right knee immobilizer Activity Tolerance: Patient tolerated treatment well Patient left: in bed;with call bell/phone within reach;with family/visitor present Nurse Communication: Mobility status;Precautions;Weight bearing status         Time: 1610-9604 (15 minutes spent cleaning bed 2/2 soiled bed) PT Time Calculation (min) (ACUTE ONLY): 40 min   Charges:   PT Evaluation $Initial PT Evaluation Tier I: 1 Procedure PT Treatments $Therapeutic Exercise: 8-22 mins   PT G Codes:       Joslyn Hy PT, DPT [phone] Pager:  [phone] 09/26/2014, 2:50 PM

## 2014-09-26 NOTE — H&P (Signed)
PREOPERATIVE H&P  Chief Complaint: DJD RIGHT KNEE  HPI: Gavin Maxwell is a 69 y.o. male who presents for preoperative history and physical with a diagnosis of DJD RIGHT KNEE. Symptoms are rated as moderate to severe, and have been worsening.  This is significantly impairing activities of daily living.  He has elected for surgical management.   He has failed injections, activity modification, anti-inflammatories, and assistive devices.  Preoperative X-rays demonstrate end stage degenerative changes with osteophyte formation, loss of joint space, subchondral sclerosis.   Past Medical History  Diagnosis Date  . Hypertension   . Anxiety   . Depression   . GERD (gastroesophageal reflux disease)   . Cancer     non hodgins  lymphoma  . Sleep apnea     cpap  . Primary localized osteoarthrosis, shoulder region, rotator cuff arthropathy 10/04/2013  . History of hiatal hernia    Past Surgical History  Procedure Laterality Date  . Joint replacement      hip  . Cholecystectomy    . Shoulder acromioplasty      x 5 shoulder surgeries  . Nasal sinus surgery      x2  . Eye surgery    . Reverse shoulder arthroplasty Right 10/04/2013    Procedure: REVERSE SHOULDER ARTHROPLASTY;  Surgeon: Johnny Bridge, MD;  Location: [facility];  Service: Orthopedics;  Laterality: Right;  . Cardiac catheterization      20 yrs. ago   History   Social History  . Marital Status: Married    Spouse Name: N/A  . Number of Children: N/A  . Years of Education: N/A   Social History Main Topics  . Smoking status: Former Smoker -- 2.00 packs/day for 20 years    Types: Cigarettes  . Smokeless tobacco: Not on file  . Alcohol Use: No  . Drug Use: No  . Sexual Activity: Not on file   Other Topics Concern  . None   Social History Narrative   History reviewed. No pertinent family history. Allergies  Allergen Reactions  . Augmentin [Amoxicillin-Pot Clavulanate] Nausea And Vomiting  . Celebrex [Celecoxib]  Nausea And Vomiting   Prior to Admission medications   Medication Sig Start Date End Date Taking? Authorizing Provider  acetaminophen (TYLENOL) 500 MG tablet Take 1,000 mg by mouth daily as needed (pain).   Yes Historical Provider, MD  aspirin EC 81 MG tablet Take 81 mg by mouth every morning.   Yes Historical Provider, MD  atorvastatin (LIPITOR) 80 MG tablet Take 80 mg by mouth daily with supper.   Yes Historical Provider, MD  Calcium Carbonate (CALCIUM 600 PO) Take 600 mg by mouth daily.   Yes Historical Provider, MD  fenofibrate 160 MG tablet Take 160 mg by mouth every morning.   Yes Historical Provider, MD  ferrous sulfate (IRON SUPPLEMENT) 325 (65 FE) MG tablet Take 325 mg by mouth daily.   Yes Historical Provider, MD  fluticasone (FLONASE) 50 MCG/ACT nasal spray Place 2 sprays into both nostrils daily.    Yes Historical Provider, MD  gabapentin (NEURONTIN) 300 MG capsule Take 300 mg by mouth 2 (two) times daily.   Yes Historical Provider, MD  meloxicam (MOBIC) 15 MG tablet Take 15 mg by mouth every morning.  08/28/14  Yes Historical Provider, MD  metoprolol succinate (TOPROL-XL) 25 MG 24 hr tablet Take 25 mg by mouth every evening.    Yes Historical Provider, MD  naproxen sodium (ANAPROX) 220 MG tablet Take 440 mg by mouth at  bedtime as needed (pain).   Yes Historical Provider, MD  pantoprazole (PROTONIX) 40 MG tablet Take 40 mg by mouth daily.    Yes Historical Provider, MD  sertraline (ZOLOFT) 50 MG tablet Take 50 mg by mouth at bedtime.   Yes Historical Provider, MD  baclofen (LIORESAL) 10 MG tablet Take 1 tablet (10 mg total) by mouth 3 (three) times daily. As needed for muscle spasm Patient not taking: Reported on 09/13/2014 10/04/13   Marchia Bond, MD  ondansetron (ZOFRAN) 4 MG tablet Take 1 tablet (4 mg total) by mouth every 8 (eight) hours as needed for nausea or vomiting. Patient not taking: Reported on 09/13/2014 10/04/13   Marchia Bond, MD  oxyCODONE-acetaminophen (PERCOCET) 10-325  MG per tablet Take 1-2 tablets by mouth every 6 (six) hours as needed for pain. MAXIMUM TOTAL ACETAMINOPHEN DOSE IS 4000 MG PER DAY Patient not taking: Reported on 09/13/2014 10/04/13   Marchia Bond, MD     Positive ROS: All other systems have been reviewed and were otherwise negative with the exception of those mentioned in the HPI and as above.  Physical Exam: General: Alert, no acute distress Cardiovascular: No pedal edema Respiratory: No cyanosis, no use of accessory musculature GI: No organomegaly, abdomen is soft and non-tender Skin: No lesions in the area of chief complaint Neurologic: Sensation intact distally Psychiatric: Patient is competent for consent with normal mood and affect Lymphatic: No axillary or cervical lymphadenopathy  MUSCULOSKELETAL: right knee ROM 5-115 with significant crepitance and pain with ROM.   Assessment: DJD RIGHT KNEE  Plan: Plan for Procedure(s): RIGHT TOTAL KNEE ARTHROPLASTY  The risks benefits and alternatives were discussed with the patient including but not limited to the risks of nonoperative treatment, versus surgical intervention including infection, bleeding, nerve injury,  blood clots, cardiopulmonary complications, morbidity, mortality, among others, and they were willing to proceed.   Johnny Bridge, MD Cell [phone]   09/26/2014 6:25 AM

## 2014-09-26 NOTE — Anesthesia Postprocedure Evaluation (Signed)
Anesthesia Post Note  Patient: Gavin Maxwell  Procedure(s) Performed: Procedure(s) (LRB): RIGHT TOTAL KNEE ARTHROPLASTY (Right)  Anesthesia type: MAC/SAB  Patient location: PACU  Post pain: Pain level controlled  Post assessment: Patient's Cardiovascular Status Stable  Last Vitals:  Filed Vitals:   09/26/14 1045  BP:   Pulse: 63  Temp:   Resp: 12    Post vital signs: Reviewed and stable  Level of consciousness: sedated  Complications: No apparent anesthesia complications

## 2014-09-26 NOTE — Anesthesia Procedure Notes (Signed)
Spinal Patient location during procedure: OR Start time: 09/26/2014 7:32 AM End time: 09/26/2014 7:38 AM Staffing Anesthesiologist: Shamina Etheridge Performed by: anesthesiologist  Preanesthetic Checklist Completed: patient identified, site marked, surgical consent, pre-op evaluation, timeout performed, IV checked, risks and benefits discussed and monitors and equipment checked Spinal Block Patient position: sitting Prep: ChloraPrep Patient monitoring: heart rate, cardiac monitor, continuous pulse ox and blood pressure Approach: midline Location: L3-4 Needle Needle type: Quincke  Needle gauge: 25 G Needle length: 5 cm Needle insertion depth: 3 cm Assessment Sensory level: T6 Additional Notes Tolerated well

## 2014-09-27 ENCOUNTER — Encounter (HOSPITAL_COMMUNITY): Payer: Self-pay | Admitting: Orthopedic Surgery

## 2014-09-27 LAB — BASIC METABOLIC PANEL
Anion gap: 11 (ref 5–15)
BUN: 21 mg/dL — AB (ref 6–20)
CALCIUM: 8.2 mg/dL — AB (ref 8.9–10.3)
CO2: 23 mmol/L (ref 22–32)
Chloride: 99 mmol/L — ABNORMAL LOW (ref 101–111)
Creatinine, Ser: 1.51 mg/dL — ABNORMAL HIGH (ref 0.61–1.24)
GFR calc Af Amer: 52 mL/min — ABNORMAL LOW (ref >60)
GFR, EST NON AFRICAN AMERICAN: 45 mL/min — AB (ref >60)
GLUCOSE: 105 mg/dL — AB (ref 65–99)
POTASSIUM: 4.3 mmol/L (ref 3.5–5.1)
Sodium: 133 mmol/L — ABNORMAL LOW (ref 135–145)

## 2014-09-27 LAB — CBC
HEMATOCRIT: 28.7 % — AB (ref 39.0–52.0)
Hemoglobin: 9.9 g/dL — ABNORMAL LOW (ref 13.0–17.0)
MCH: 31.4 pg (ref 26.0–34.0)
MCHC: 34.5 g/dL (ref 30.0–36.0)
MCV: 91.1 fL (ref 78.0–100.0)
Platelets: DECREASED 10*3/uL (ref 150–400)
RBC: 3.15 MIL/uL — ABNORMAL LOW (ref 4.22–5.81)
RDW: 12.8 % (ref 11.5–15.5)
WBC: 6.1 10*3/uL (ref 4.0–10.5)

## 2014-09-27 NOTE — Progress Notes (Signed)
Patient ID: Gavin Maxwell, male   DOB: Feb 14, 1946, 69 y.o.   MRN: [id number]     Subjective:  Patient reports pain as mild.  Patient states that he is doing very well and would like to go home as soon as possible   Objective:   VITALS:   Filed Vitals:   09/26/14 1255 09/26/14 2125 09/27/14 0200 09/27/14 0547  BP: 104/60 110/61 106/51 101/51  Pulse: 62 73 82 73  Temp: 97.5 F (36.4 C) 98.2 F (36.8 C) 98 F (36.7 C) 97.8 F (36.6 C)  TempSrc:  Oral    Resp: 16 16 16 18   Weight:      SpO2: 100% 100% 96% 98%    ABD soft Sensation intact distally Dorsiflexion/Plantar flexion intact Incision: dressing C/D/I and no drainage Good ankle and foot motion patient can straight leg raise   Lab Results  Component Value Date   WBC PENDING 09/27/2014   HGB 9.9* 09/27/2014   HCT 28.7* 09/27/2014   MCV 91.1 09/27/2014   PLT PENDING 09/27/2014   BMET    Component Value Date/Time   NA 133* 09/27/2014 0600   NA 140 09/23/2012 1018   K 4.3 09/27/2014 0600   K 4.4 09/23/2012 1018   CL 99* 09/27/2014 0600   CL 103 09/23/2012 1018   CO2 23 09/27/2014 0600   CO2 30 09/23/2012 1018   GLUCOSE 105* 09/27/2014 0600   GLUCOSE 129* 09/23/2012 1018   BUN 21* 09/27/2014 0600   BUN 28* 09/23/2012 1018   CREATININE 1.51* 09/27/2014 0600   CREATININE 1.94* 09/23/2012 1018   CALCIUM 8.2* 09/27/2014 0600   CALCIUM 9.4 09/23/2012 1018   GFRNONAA 45* 09/27/2014 0600   GFRNONAA 34* 09/23/2012 1018   GFRAA 52* 09/27/2014 0600   GFRAA 40* 09/23/2012 1018     Assessment/Plan: 1 Day Post-Op   Principal Problem:   Primary localized osteoarthritis of knee Active Problems:   Knee osteoarthritis   Advance diet Up with therapy WBAT Plan for DC when cleared with PT likely tomorrow Dry dressing PRN   Remonia Richter 09/27/2014, 7:38 AM  Discussed and agree with above.  Probable discharge tomorrow.    Marchia Bond, MD Cell [phone]

## 2014-09-27 NOTE — Progress Notes (Signed)
Physical Therapy Treatment Patient Details Name: Gavin Maxwell MRN: [id number] DOB: [birth year] Today's Date: 09/27/2014    History of Present Illness Pt is a 69 y/o M s/p R TKA.  Pt's PMH inlcudes HTN, anxiety and depression, cancer, L THA, 5 shoulder surgeries, R reverse total shoulder.    PT Comments    Patient is progressing well towards physical therapy goals, ambulating up to 65 feet with a rolling walker at a min guard level for safety. Tolerating exercises well and reviewed precautions. Plan for stair training this afternoon. Patient will continue to benefit from skilled physical therapy services to further improve independence with functional mobility.    Follow Up Recommendations  Home health PT;Supervision for mobility/OOB     Equipment Recommendations  Rolling walker with 5" wheels    Recommendations for Other Services OT consult     Precautions / Restrictions Precautions Precautions: Knee;Fall Precaution Comments: reviewed precautions Required Braces or Orthoses: Knee Immobilizer - Right Knee Immobilizer - Right: On when out of bed or walking Restrictions Weight Bearing Restrictions: Yes RLE Weight Bearing: Weight bearing as tolerated    Mobility  Bed Mobility               General bed mobility comments: In recliner  Transfers Overall transfer level: Needs assistance Equipment used: Rolling walker (2 wheeled) Transfers: Sit to/from Stand Sit to Stand: Min guard         General transfer comment: Close guard for safety. Performed from reclining chair with arm rests. VC for hand placement.  Ambulation/Gait Ambulation/Gait assistance: Min guard Ambulation Distance (Feet): 65 Feet Assistive device: Rolling walker (2 wheeled) Gait Pattern/deviations: Step-to pattern;Step-through pattern;Decreased step length - left;Decreased stance time - right;Antalgic Gait velocity: slow Gait velocity interpretation: Below normal speed for age/gender General  Gait Details: VC for sequencing of gait, use of rolling walker, and Rt knee extension in stance phase for quad activation. No buckling noted with Rt knee immobilizer in place.   Stairs            Wheelchair Mobility    Modified Rankin (Stroke Patients Only)       Balance           Standing balance support: Single extremity supported Standing balance-Leahy Scale: Poor                      Cognition Arousal/Alertness: Awake/alert Behavior During Therapy: WFL for tasks assessed/performed Overall Cognitive Status: Within Functional Limits for tasks assessed                      Exercises Total Joint Exercises Ankle Circles/Pumps: AROM;Both;10 reps;Seated Quad Sets: AROM;Both;10 reps;Seated Heel Slides: AROM;Right;10 reps;Seated Long Arc Quad: Strengthening;Right;10 reps;Seated    General Comments General comments (skin integrity, edema, etc.): Discussed progression of PT      Pertinent Vitals/Pain Pain Assessment: 0-10 Pain Score: 5  Pain Location: Rt knee Pain Descriptors / Indicators: Aching Pain Intervention(s): Monitored during session;Repositioned;Patient requesting pain meds-RN notified    Home Living                      Prior Function            PT Goals (current goals can now be found in the care plan section) Acute Rehab PT Goals Patient Stated Goal: to be able to walk PT Goal Formulation: With patient/family Time For Goal Achievement: 10/03/14 Potential to Achieve Goals: Good Progress towards PT goals:  Progressing toward goals    Frequency  7X/week    PT Plan Current plan remains appropriate    Co-evaluation             End of Session Equipment Utilized During Treatment: Gait belt;Right knee immobilizer Activity Tolerance: Patient tolerated treatment well Patient left: with call bell/phone within reach;with family/visitor present;in chair     Time: 8329-1916 PT Time Calculation (min) (ACUTE ONLY): 27  min  Charges:  $Gait Training: 8-22 mins $Therapeutic Exercise: 8-22 mins                    G Codes:      Ellouise Newer 10-15-14, 10:38 AM Elayne Snare, [place]

## 2014-09-27 NOTE — Progress Notes (Signed)
Occupational Therapy Evaluation Patient Details Name: Gavin Maxwell MRN: [id number] DOB: 07-28-1945 Today's Date: 09/27/2014    History of Present Illness Pt is a 69 y/o M s/p R TKA.  Pt's PMH inlcudes HTN, anxiety and depression, cancer, L THA, 5 shoulder surgeries, R reverse total shoulder.   Clinical Impression   Pt admitted with the above diagnoses and presents with below problem list. Pt will benefit from continued acute OT to address the below listed deficits and maximize independence with BADLs prior to d/c to venue below. PTA pt was mod I with ADLs. Pt is currently min guard with LB ADLs. ADLs completed and education provided as detailed below. OT continue to follow acutely.      Follow Up Recommendations  Supervision/Assistance - 24 hour;No OT follow up    Equipment Recommendations  3 in 1 bedside comode    Recommendations for Other Services       Precautions / Restrictions Precautions Precautions: Knee;Fall Precaution Comments: reviewed precautions Required Braces or Orthoses: Knee Immobilizer - Right Knee Immobilizer - Right: On when out of bed or walking Restrictions Weight Bearing Restrictions: Yes RLE Weight Bearing: Weight bearing as tolerated      Mobility Bed Mobility Overal bed mobility: Needs Assistance Bed Mobility: Supine to Sit     Supine to sit: Min assist     General bed mobility comments: assist to power up trunk due to LUE weakness  Transfers Overall transfer level: Needs assistance Equipment used: Rolling walker (2 wheeled) Transfers: Sit to/from [facility] Sit to Stand: Min guard Stand pivot transfers: Min guard       General transfer comment: Close min guard for safety but ultimately no physical assist. SPT from EOB to recliner.    Balance Overall balance assessment: Needs assistance         Standing balance support: Bilateral upper extremity supported;During functional activity Standing balance-Leahy  Scale: Poor Standing balance comment: rw for balance                            ADL Overall ADL's : Needs assistance/impaired Eating/Feeding: Set up;Sitting   Grooming: Set up;Sitting   Upper Body Bathing: Set up;Sitting   Lower Body Bathing: Min guard;Sit to/from stand   Upper Body Dressing : Set up;Sitting   Lower Body Dressing: Min guard;Sit to/from stand   Toilet Transfer: Min guard;BSC;RW   Toileting- Water quality scientist and Hygiene: Min guard;Sit to/from stand   Tub/ Shower Transfer: Min guard;3 in [employer] walker     General ADL Comments: Pt completed bed mobility with min A and completed SPT from EOB to recliner at min guard level. ADL education provided.      Vision     Perception     Praxis      Pertinent Vitals/Pain Pain Assessment: 0-10 Pain Score: 2  Pain Location: Rt knee Pain Descriptors / Indicators: Aching Pain Intervention(s): Monitored during session;Repositioned     Hand Dominance     Extremity/Trunk Assessment Upper Extremity Assessment Upper Extremity Assessment: LUE deficits/detail LUE Deficits / Details: Pt wears LUE orthotic which he says is 2/2 h/o L shoulder replacement w/ fx 2/2 cancer.  Pt reports he has been told by his doctor that he can WBAT through LUE and does not have any other restrictions. LUE MMT 3/5 grossly.   Lower Extremity Assessment Lower Extremity Assessment: Defer to PT evaluation       Communication Communication Communication: No difficulties  Cognition Arousal/Alertness: Awake/alert Behavior During Therapy: WFL for tasks assessed/performed Overall Cognitive Status: Within Functional Limits for tasks assessed                     General Comments       Exercises     Shoulder Instructions      Home Living Family/patient expects to be discharged to:: Private residence Living Arrangements: Spouse/significant other;Children Available Help at Discharge: Family;Available 24  hours/day Type of Home: House Home Access: Stairs to enter [employer] of Steps: 1 Entrance Stairs-Rails: None Home Layout: One level     Bathroom Shower/Tub: Astronomer Accessibility: Yes How Accessible: Accessible via walker Home Equipment: Cane - single point          Prior Functioning/Environment Level of Independence: Independent with assistive device(s)        Comments: Cane for long distance.  Says his R knee used to "give way" w/ long distance walking    OT Diagnosis: Acute pain   OT Problem List: Impaired balance (sitting and/or standing);Decreased knowledge of use of DME or AE;Decreased knowledge of precautions;Pain   OT Treatment/Interventions: Self-care/ADL training;DME and/or AE instruction;Therapeutic activities;Patient/family education;Balance training    OT Goals(Current goals can be found in the care plan section) Acute Rehab OT Goals Patient Stated Goal: to be able to walk OT Goal Formulation: With patient Time For Goal Achievement: 10/04/14 Potential to Achieve Goals: Good ADL Goals Pt Will Perform Lower Body Dressing: with modified independence;with adaptive equipment;sit to/from stand Pt Will Transfer to Toilet: with modified independence;ambulating (3n1 over toilet) Pt Will Perform Toileting - Clothing Manipulation and hygiene: with modified independence;with adaptive equipment;sit to/from stand Pt Will Perform Tub/Shower Transfer: Shower transfer;with supervision;3 in 1;ambulating;rolling walker Additional ADL Goal #1: Pt will complete supine<>EOB at min guard level to prepare for OOB ADLs.  OT Frequency: Min 2X/week   Barriers to D/C:            Co-evaluation              End of Session Equipment Utilized During Treatment: Gait belt;Rolling walker;Right knee immobilizer  Activity Tolerance: Patient tolerated treatment well Patient left: in chair;with call bell/phone within reach;Other (comment) (with PT)    Time: 0350-0938 OT Time Calculation (min): 26 min Charges:  OT General Charges $OT Visit: 1 Procedure OT Evaluation $Initial OT Evaluation Tier I: 1 Procedure OT Treatments $Self Care/Home Management : 8-22 mins G-Codes:    Hortencia Pilar 10-02-14, 11:06 AM

## 2014-09-27 NOTE — Discharge Instructions (Signed)
INSTRUCTIONS AFTER JOINT REPLACEMENT  ° °o Remove items at home which could result in a fall. This includes throw rugs or furniture in walking pathways °o ICE to the affected joint every three hours while awake for 30 minutes at a time, for at least the first 3-5 days, and then as needed for pain and swelling.  Continue to use ice for pain and swelling. You may notice swelling that will progress down to the foot and ankle.  This is normal after surgery.  Elevate your leg when you are not up walking on it.   °o Continue to use the breathing machine you got in the hospital (incentive spirometer) which will help keep your temperature down.  It is common for your temperature to cycle up and down following surgery, especially at night when you are not up moving around and exerting yourself.  The breathing machine keeps your lungs expanded and your temperature down. ° ° °DIET:  As you were doing prior to hospitalization, we recommend a well-balanced diet. ° °DRESSING / WOUND CARE / SHOWERING ° °You may change your dressing 3-5 days after surgery.  Then change the dressing every day with sterile gauze.  Please use good hand washing techniques before changing the dressing.  Do not use any lotions or creams on the incision until instructed by your surgeon. ° °ACTIVITY ° °o Increase activity slowly as tolerated, but follow the weight bearing instructions below.   °o No driving for 6 weeks or until further direction given by your physician.  You cannot drive while taking narcotics.  °o No lifting or carrying greater than 10 lbs. until further directed by your surgeon. °o Avoid periods of inactivity such as sitting longer than an hour when not asleep. This helps prevent blood clots.  °o You may return to work once you are authorized by your doctor.  ° ° ° °WEIGHT BEARING  ° °Weight bearing as tolerated with assist device (walker, cane, etc) as directed, use it as long as suggested by your surgeon or therapist, typically at  least 4-6 weeks. ° ° °EXERCISES ° °Results after joint replacement surgery are often greatly improved when you follow the exercise, range of motion and muscle strengthening exercises prescribed by your doctor. Safety measures are also important to protect the joint from further injury. Any time any of these exercises cause you to have increased pain or swelling, decrease what you are doing until you are comfortable again and then slowly increase them. If you have problems or questions, call your caregiver or physical therapist for advice.  ° °Rehabilitation is important following a joint replacement. After just a few days of immobilization, the muscles of the leg can become weakened and shrink (atrophy).  These exercises are designed to build up the tone and strength of the thigh and leg muscles and to improve motion. Often times heat used for twenty to thirty minutes before working out will loosen up your tissues and help with improving the range of motion but do not use heat for the first two weeks following surgery (sometimes heat can increase post-operative swelling).  ° °These exercises can be done on a training (exercise) mat, on the floor, on a table or on a bed. Use whatever works the best and is most comfortable for you.    Use music or television while you are exercising so that the exercises are a pleasant break in your day. This will make your life better with the exercises acting as a break   in your routine that you can look forward to.   Perform all exercises about fifteen times, three times per day or as directed.  You should exercise both the operative leg and the other leg as well. ° °Exercises include: °  °• Quad Sets - Tighten up the muscle on the front of the thigh (Quad) and hold for 5-10 seconds.   °• Straight Leg Raises - With your knee straight (if you were given a brace, keep it on), lift the leg to 60 degrees, hold for 3 seconds, and slowly lower the leg.  Perform this exercise against  resistance later as your leg gets stronger.  °• Leg Slides: Lying on your back, slowly slide your foot toward your buttocks, bending your knee up off the floor (only go as far as is comfortable). Then slowly slide your foot back down until your leg is flat on the floor again.  °• Angel Wings: Lying on your back spread your legs to the side as far apart as you can without causing discomfort.  °• Hamstring Strength:  Lying on your back, push your heel against the floor with your leg straight by tightening up the muscles of your buttocks.  Repeat, but this time bend your knee to a comfortable angle, and push your heel against the floor.  You may put a pillow under the heel to make it more comfortable if necessary.  ° °A rehabilitation program following joint replacement surgery can speed recovery and prevent re-injury in the future due to weakened muscles. Contact your doctor or a physical therapist for more information on knee rehabilitation.  ° ° °CONSTIPATION ° °Constipation is defined medically as fewer than three stools per week and severe constipation as less than one stool per week.  Even if you have a regular bowel pattern at home, your normal regimen is likely to be disrupted due to multiple reasons following surgery.  Combination of anesthesia, postoperative narcotics, change in appetite and fluid intake all can affect your bowels.  ° °YOU MUST use at least one of the following options; they are listed in order of increasing strength to get the job done.  They are all available over the counter, and you may need to use some, POSSIBLY even all of these options:   ° °Drink plenty of fluids (prune juice may be helpful) and high fiber foods °Colace 100 mg by mouth twice a day  °Senokot for constipation as directed and as needed Dulcolax (bisacodyl), take with full glass of water  °Miralax (polyethylene glycol) once or twice a day as needed. ° °If you have tried all these things and are unable to have a bowel  movement in the first 3-4 days after surgery call either your surgeon or your primary doctor.   ° °If you experience loose stools or diarrhea, hold the medications until you stool forms back up.  If your symptoms do not get better within 1 week or if they get worse, check with your doctor.  If you experience "the worst abdominal pain ever" or develop nausea or vomiting, please contact the office immediately for further recommendations for treatment. ° ° °ITCHING:  If you experience itching with your medications, try taking only a single pain pill, or even half a pain pill at a time.  You can also use Benadryl over the counter for itching or also to help with sleep.  ° °TED HOSE STOCKINGS:  Use stockings on both legs until for at least 2 weeks or as   directed by physician office. They may be removed at night for sleeping. ° °MEDICATIONS:  See your medication summary on the “After Visit Summary” that nursing will review with you.  You may have some home medications which will be placed on hold until you complete the course of blood thinner medication.  It is important for you to complete the blood thinner medication as prescribed. ° °PRECAUTIONS:  If you experience chest pain or shortness of breath - call 911 immediately for transfer to the hospital emergency department.  ° °If you develop a fever greater that 101 F, purulent drainage from wound, increased redness or drainage from wound, foul odor from the wound/dressing, or calf pain - CONTACT YOUR SURGEON.   °                                                °FOLLOW-UP APPOINTMENTS:  If you do not already have a post-op appointment, please call the office for an appointment to be seen by your surgeon.  Guidelines for how soon to be seen are listed in your “After Visit Summary”, but are typically between 1-4 weeks after surgery. ° °OTHER INSTRUCTIONS:  ° °Knee Replacement:  Do not place pillow under knee, focus on keeping the knee straight while resting. CPM  instructions: 0-90 degrees, 2 hours in the morning, 2 hours in the afternoon, and 2 hours in the evening. Place foam block, curve side up under heel at all times except when in CPM or when walking.  DO NOT modify, tear, cut, or change the foam block in any way. ° °MAKE SURE YOU:  °• Understand these instructions.  °• Get help right away if you are not doing well or get worse.  ° ° °Thank you for letting us be a part of your medical care team.  It is a privilege we respect greatly.  We hope these instructions will help you stay on track for a fast and full recovery!  ° °Information on my medicine - XARELTO® (Rivaroxaban) ° °This medication education was reviewed with me or my healthcare representative as part of my discharge preparation.  The pharmacist that spoke with me during my hospital stay was:  Bryam Taborda Kay, RPH ° °Why was Xarelto® prescribed for you? °Xarelto® was prescribed for you to reduce the risk of blood clots forming after orthopedic surgery. The medical term for these abnormal blood clots is venous thromboembolism (VTE). ° °What do you need to know about xarelto® ? °Take your Xarelto® ONCE DAILY at the same time every day. °You may take it either with or without food. ° °If you have difficulty swallowing the tablet whole, you may crush it and mix in applesauce just prior to taking your dose. ° °Take Xarelto® exactly as prescribed by your doctor and DO NOT stop taking Xarelto® without talking to the doctor who prescribed the medication.  Stopping without other VTE prevention medication to take the place of Xarelto® may increase your risk of developing a clot. ° °After discharge, you should have regular check-up appointments with your healthcare provider that is prescribing your Xarelto®.   ° °What do you do if you miss a dose? °If you miss a dose, take it as soon as you remember on the same day then continue your regularly scheduled once daily regimen the next day. Do not take two doses of   Xarelto  on the same day.   Important Safety Information A possible side effect of Xarelto is bleeding. You should call your healthcare provider right away if you experience any of the following: ? Bleeding from an injury or your nose that does not stop. ? Unusual colored urine (red or dark brown) or unusual colored stools (red or black). ? Unusual bruising for unknown reasons. ? A serious fall or if you hit your head (even if there is no bleeding).  Some medicines may interact with Xarelto and might increase your risk of bleeding while on Xarelto. To help avoid this, consult your healthcare provider or pharmacist prior to using any new prescription or non-prescription medications, including herbals, vitamins, non-steroidal anti-inflammatory drugs (NSAIDs) and supplements.  This website has more information on Xarelto: [email or web address]

## 2014-09-27 NOTE — Progress Notes (Signed)
Physical Therapy Treatment Patient Details Name: Gavin Maxwell MRN: [id number] DOB: Jul 29, 1945 Today's Date: 09/27/2014    History of Present Illness Pt is a 69 y/o M s/p R TKA.  Pt's PMH inlcudes HTN, anxiety and depression, cancer, L THA, 5 shoulder surgeries, R reverse total shoulder.    PT Comments    Continues to make good progress with physical therapy. Ambulates 85 feet this afternoon with a rolling walker for support. Safely completed stair training with min assist to stabilize rolling walker. Tolerates therapeutic exercises well. Most likely will be appropriate for d/c from PT standpoint after 1 more therapy session.   Follow Up Recommendations  Home health PT;Supervision for mobility/OOB     Equipment Recommendations  Rolling walker with 5" wheels    Recommendations for Other Services OT consult     Precautions / Restrictions Precautions Precautions: Knee;Fall Precaution Comments: reviewed precautions Required Braces or Orthoses: Knee Immobilizer - Right Knee Immobilizer - Right: On when out of bed or walking Restrictions Weight Bearing Restrictions: Yes RLE Weight Bearing: Weight bearing as tolerated    Mobility  Bed Mobility Overal bed mobility: Needs Assistance Bed Mobility: Sit to Supine     Supine to sit: Min assist Sit to supine: Min guard   General bed mobility comments: Min guard for safety. Instructions for LLE to support RLE into bed. Requires extra time.  Transfers Overall transfer level: Needs assistance Equipment used: Rolling walker (2 wheeled) Transfers: Sit to/from Stand Sit to Stand: Min guard Stand pivot transfers: Min guard       General transfer comment: Min guard for safety. Requires extra time but no physical assist. VC for hand and foot placement. Guarded with limited weightbearing through RLE until standing upright.  Ambulation/Gait Ambulation/Gait assistance: Min guard Ambulation Distance (Feet): 85 Feet Assistive device:  Rolling walker (2 wheeled) Gait Pattern/deviations: Step-through pattern;Decreased step length - left;Decreased stance time - right;Antalgic;Trunk flexed Gait velocity: slow Gait velocity interpretation: Below normal speed for age/gender General Gait Details: Focused on symmetry of gait. Moderately antalgic but no buckling of Rt knee noted without knee immobilizer in place. Cues for rt knee extension in stance phase for quad activation.  VC for upright posture with forward gaze.   Stairs Stairs: Yes Stairs assistance: Min assist Stair Management: No rails;Step to pattern;Backwards;With walker Number of Stairs: 2 (x2) General stair comments: Practiced stair navigation with cues for sequencing and technique. Min assist to block RW. Pt able to correctly teach back on second attempt.  Wheelchair Mobility    Modified Rankin (Stroke Patients Only)       Balance Overall balance assessment: Needs assistance         Standing balance support: Bilateral upper extremity supported;During functional activity Standing balance-Leahy Scale: Poor Standing balance comment: rw for balance                    Cognition Arousal/Alertness: Awake/alert Behavior During Therapy: WFL for tasks assessed/performed Overall Cognitive Status: Within Functional Limits for tasks assessed                      Exercises Total Joint Exercises Ankle Circles/Pumps: AROM;Both;10 reps;Seated Quad Sets: AROM;Both;10 reps;Seated Gluteal Sets: Strengthening;Both;10 reps;Supine Heel Slides: AROM;Right;10 reps;Seated Straight Leg Raises: Strengthening;AAROM;Right;5 reps;Supine Long Arc Quad: Strengthening;Right;10 reps;Seated Goniometric ROM: 18-94 degrees of Rt knee flexion.    General Comments        Pertinent Vitals/Pain Pain Assessment: 0-10 Pain Score: 2  Pain Location: Rtknee Pain  Descriptors / Indicators: Aching Pain Intervention(s): Monitored during session;Repositioned    Home  Living Family/patient expects to be discharged to:: Private residence Living Arrangements: Spouse/significant other;Children Available Help at Discharge: Family;Available 24 hours/day Type of Home: House Home Access: Stairs to enter Entrance Stairs-Rails: None Home Layout: One level Home Equipment: Cane - single point      Prior Function Level of Independence: Independent with assistive device(s)      Comments: Cane for long distance.  Says his R knee used to "give way" w/ long distance walking   PT Goals (current goals can now be found in the care plan section) Acute Rehab PT Goals Patient Stated Goal: to be able to walk PT Goal Formulation: With patient/family Time For Goal Achievement: 10/03/14 Potential to Achieve Goals: Good Progress towards PT goals: Progressing toward goals    Frequency  7X/week    PT Plan Current plan remains appropriate    Co-evaluation             End of Session Equipment Utilized During Treatment: Gait belt Activity Tolerance: Patient tolerated treatment well Patient left: with call bell/phone within reach;in bed     Time: 1324-1411 PT Time Calculation (min) (ACUTE ONLY): 47 min  Charges:  $Gait Training: 8-22 mins $Therapeutic Exercise: 8-22 mins $Therapeutic Activity: 8-22 mins                    G Codes:      Ellouise Newer 10/06/2014, 2:24 PM Camille Bal [place], [place]

## 2014-09-28 LAB — CBC
HEMATOCRIT: 28 % — AB (ref 39.0–52.0)
HEMOGLOBIN: 9.5 g/dL — AB (ref 13.0–17.0)
MCH: 31.3 pg (ref 26.0–34.0)
MCHC: 33.9 g/dL (ref 30.0–36.0)
MCV: 92.1 fL (ref 78.0–100.0)
Platelets: 174 10*3/uL (ref 150–400)
RBC: 3.04 MIL/uL — AB (ref 4.22–5.81)
RDW: 12.8 % (ref 11.5–15.5)
WBC: 5.6 10*3/uL (ref 4.0–10.5)

## 2014-09-28 NOTE — Discharge Summary (Signed)
Physician Discharge Summary  Patient ID: Gavin Maxwell MRN: [id number] DOB/AGE: May 11, 1945 69 y.o.  Admit date: 09/26/2014 Discharge date: 09/28/2014  Admission Diagnoses:  Primary localized osteoarthritis of knee  Discharge Diagnoses:  Principal Problem:   Primary localized osteoarthritis of knee Active Problems:   Knee osteoarthritis   Past Medical History  Diagnosis Date  . Hypertension   . Anxiety   . Depression   . GERD (gastroesophageal reflux disease)   . Cancer     non hodgins  lymphoma  . Primary localized osteoarthrosis, shoulder region, rotator cuff arthropathy 10/04/2013  . History of hiatal hernia   . Primary localized osteoarthritis of knee 09/26/2014  . Sleep apnea     cpap    Surgeries: Procedure(s): RIGHT TOTAL KNEE ARTHROPLASTY on 09/26/2014   Consultants (if any):    Discharged Condition: Improved  Hospital Course: Gavin Maxwell is an 69 y.o. male who was admitted 09/26/2014 with a diagnosis of Primary localized osteoarthritis of knee and went to the operating room on 09/26/2014 and underwent the above named procedures.    He was given perioperative antibiotics:  Anti-infectives    Start     Dose/Rate Route Frequency Ordered Stop   09/26/14 1200  ceFAZolin (ANCEF) IVPB 2 g/50 mL premix     2 g 100 mL/hr over 30 Minutes Intravenous Every 6 hours 09/26/14 1157 09/26/14 2110   09/26/14 0630  ceFAZolin (ANCEF) IVPB 2 g/50 mL premix    Comments:  Okay to give test dose   2 g 100 mL/hr over 30 Minutes Intravenous To [facility]Stay [facility] 09/25/14 1400 09/26/14 0746    .  He was given sequential compression devices, early ambulation, and xarelto for DVT prophylaxis.  He benefited maximally from the hospital stay and there were no complications.    Recent vital signs:  Filed Vitals:   09/28/14 0600  BP: 103/71  Pulse: 71  Temp: 97.9 F (36.6 C)  Resp: 18    Recent laboratory studies:  Lab Results  Component Value Date   HGB 9.5* 09/28/2014    HGB 9.9* 09/27/2014   HGB 12.9* 09/14/2014   Lab Results  Component Value Date   WBC 5.6 09/28/2014   PLT 174 09/28/2014   No results found for: INR Lab Results  Component Value Date   NA 133* 09/27/2014   K 4.3 09/27/2014   CL 99* 09/27/2014   CO2 23 09/27/2014   BUN 21* 09/27/2014   CREATININE 1.51* 09/27/2014   GLUCOSE 105* 09/27/2014    Discharge Medications:     Medication List    STOP taking these medications        acetaminophen 500 MG tablet  Commonly known as:  TYLENOL     meloxicam 15 MG tablet  Commonly known as:  MOBIC     naproxen sodium 220 MG tablet  Commonly known as:  ANAPROX     oxyCODONE-acetaminophen 10-325 MG per tablet  Commonly known as:  PERCOCET      TAKE these medications        aspirin EC 81 MG tablet  Take 81 mg by mouth every morning.     atorvastatin 80 MG tablet  Commonly known as:  LIPITOR  Take 80 mg by mouth daily with supper.     baclofen 10 MG tablet  Commonly known as:  LIORESAL  Take 1 tablet (10 mg total) by mouth 3 (three) times daily. As needed for muscle spasm     CALCIUM 600 PO  Take 600 mg by mouth daily.     fenofibrate 160 MG tablet  Take 160 mg by mouth every morning.     fluticasone 50 MCG/ACT nasal spray  Commonly known as:  FLONASE  Place 2 sprays into both nostrils daily.     gabapentin 300 MG capsule  Commonly known as:  NEURONTIN  Take 300 mg by mouth 2 (two) times daily.     HYDROcodone-acetaminophen 10-325 MG per tablet  Commonly known as:  NORCO  Take 1-2 tablets by mouth every 6 (six) hours as needed.     IRON SUPPLEMENT 325 (65 FE) MG tablet  Generic drug:  ferrous sulfate  Take 325 mg by mouth daily.     metoprolol succinate 25 MG 24 hr tablet  Commonly known as:  TOPROL-XL  Take 25 mg by mouth every evening.     ondansetron 4 MG tablet  Commonly known as:  ZOFRAN  Take 1 tablet (4 mg total) by mouth every 8 (eight) hours as needed for nausea or vomiting.     pantoprazole 40  MG tablet  Commonly known as:  PROTONIX  Take 40 mg by mouth daily.     sennosides-docusate sodium 8.6-50 MG tablet  Commonly known as:  SENOKOT-S  Take 2 tablets by mouth daily.     sertraline 50 MG tablet  Commonly known as:  ZOLOFT  Take 50 mg by mouth at bedtime.        Diagnostic Studies: Dg Knee Right Port  09/26/2014   CLINICAL DATA:  Post right total knee replacement  EXAM: PORTABLE RIGHT KNEE - 1-2 VIEW  COMPARISON:  None.  FINDINGS: Two views of the right knee submitted. There is right knee prosthesis in anatomic alignment. Postsurgical changes are noted with small amount of periarticular soft tissue air.  IMPRESSION: Right knee prosthesis in anatomic alignment. Postsurgical changes are noted.   Electronically Signed   By: Lahoma Crocker M.D.   On: 09/26/2014 10:17    Disposition: 01-Home or Self Care      Discharge Instructions    Weight bearing as tolerated    Complete by:  As directed            Follow-up Information    Follow up with Johnny Bridge, MD. Schedule an appointment as soon as possible for a visit in 2 weeks.   Specialty:  Orthopedic Surgery   Contact information:   [place] [address] [phone]        Signed: Johnny Bridge 09/28/2014, 11:34 AM

## 2014-09-28 NOTE — Care Management Note (Signed)
Case Management Note  Patient Details  Name: Gavin Maxwell MRN: [id number] Date of Birth: Mar 10, 1946  Subjective/Objective:          S/p right total knee arthroplasty          Action/Plan: Set up with [facility] for HHPT by MD office. Spoke with patient and wife, no change in discharge plan. [employer] delivered rolling walker to room, patient states that he has a 3N1 at home. Patient states that wife will be available to assist after discharge.   Expected Discharge Date:                  Expected Discharge Plan:  [place]  In-House Referral:  NA  Discharge planning Services  CM Consult  Post Acute Care Choice:  Durable Medical Equipment, Home Health Choice offered to:  Patient  DME Arranged:  Walker rolling DME Agency:  [employer]  HH Arranged:  PT Maria Antonia Agency:  [place]  Status of Service:  Completed, signed off  Medicare Important Message Given:  N/A - LOS <3 / Initial given by admissions Date Medicare IM Given:    Medicare IM give by:    Date Additional Medicare IM Given:    Additional Medicare Important Message give by:     If discussed at [place] of Stay Meetings, dates discussed:    Additional Comments:  Nila Nephew, RN 09/28/2014, 12:13 PM

## 2014-09-28 NOTE — Progress Notes (Signed)
Physical Therapy Treatment Patient Details Name: Gavin Maxwell MRN: [id number] DOB: [birth year] Today's Date: 09/28/2014    History of Present Illness Pt is a 69 y/o M s/p R TKA.  Pt's PMH inlcudes HTN, anxiety and depression, cancer, L THA, 5 shoulder surgeries, R reverse total shoulder.    PT Comments    Slow but steady this AM while ambulating up to 100 feet without loss of balance. Reports increased pain this AM but eager to return home. Pt has no further questions/concerns for PT. Reviewed precautions and exercises. Will have 24 hour supervision at home as needed. Adequate for d/c from PT standpoint.  Follow Up Recommendations  Home health PT;Supervision for mobility/OOB     Equipment Recommendations  Rolling walker with 5" wheels    Recommendations for Other Services OT consult     Precautions / Restrictions Precautions Precautions: Knee;Fall Precaution Comments: reviewed precautions Required Braces or Orthoses: Knee Immobilizer - Right Knee Immobilizer - Right: On when out of bed or walking Restrictions Weight Bearing Restrictions: Yes RLE Weight Bearing: Weight bearing as tolerated    Mobility  Bed Mobility Overal bed mobility: Needs Assistance Bed Mobility: Supine to Sit     Supine to sit: Supervision     General bed mobility comments: supervision for safety. VC for technique. No physical assist required  Transfers Overall transfer level: Needs assistance Equipment used: Rolling walker (2 wheeled) Transfers: Sit to/from Stand Sit to Stand: Supervision         General transfer comment: supervision for safety. VC for hand placement. Performed from lowest bed setting without physical assist.  Ambulation/Gait Ambulation/Gait assistance: Supervision Ambulation Distance (Feet): 100 Feet Assistive device: Rolling walker (2 wheeled) Gait Pattern/deviations: Step-through pattern;Decreased step length - left;Decreased stance time - right;Antalgic;Narrow base  of support Gait velocity: slow   General Gait Details: Moderatly antalgic and a bit slower today but stable. No buckling of Rt knee noted. No loss of balance. Good control of RW. Encouraged to decrease WB through UEs as able.   Stairs            Wheelchair Mobility    Modified Rankin (Stroke Patients Only)       Balance                                    Cognition Arousal/Alertness: Awake/alert Behavior During Therapy: WFL for tasks assessed/performed Overall Cognitive Status: Within Functional Limits for tasks assessed                      Exercises Total Joint Exercises Ankle Circles/Pumps: AROM;Both;Seated;5 reps Quad Sets: AROM;10 reps;Seated;Right Heel Slides: AROM;Right;Seated;5 reps Long Arc Quad: Strengthening;Right;Seated;5 reps    General Comments General comments (skin integrity, edema, etc.): States he feels confident with all tasks. No further questions and is ready to go home      Pertinent Vitals/Pain Pain Assessment: 0-10 Pain Score: 8  Pain Location: Rt knee Pain Descriptors / Indicators: Aching Pain Intervention(s): Monitored during session;Repositioned;Patient requesting pain meds-RN notified    Home Living                      Prior Function            PT Goals (current goals can now be found in the care plan section) Acute Rehab PT Goals Patient Stated Goal: to be able to walk PT Goal Formulation: With  patient/family Time For Goal Achievement: 10/03/14 Potential to Achieve Goals: Good Progress towards PT goals: Progressing toward goals    Frequency  7X/week    PT Plan Current plan remains appropriate    Co-evaluation             End of Session Equipment Utilized During Treatment: Gait belt Activity Tolerance: Patient tolerated treatment well Patient left: with call bell/phone within reach;in chair     Time: 1610-9604 PT Time Calculation (min) (ACUTE ONLY): 22 min  Charges:  $Gait  Training: 8-22 mins                    G Codes:      Ellouise Newer 10/07/14, 9:35 AM Elayne Snare, [place]

## 2014-09-28 NOTE — Progress Notes (Signed)
Gavin Maxwell discharged home per MD order. Discharge instructions reviewed and discussed with patient. All questions and concerns answered. Copy of instructions and scripts given to patient. IV removed.  Patient escorted to car by staff in a wheelchair. No distress noted upon discharge.   Tarri Abernethy R 09/28/2014 12:04 PM

## 2015-05-11 DIAGNOSIS — E7800 Pure hypercholesterolemia, unspecified: Secondary | ICD-10-CM | POA: Diagnosis not present

## 2015-05-11 DIAGNOSIS — Z79899 Other long term (current) drug therapy: Secondary | ICD-10-CM | POA: Diagnosis not present

## 2015-05-18 DIAGNOSIS — M545 Low back pain: Secondary | ICD-10-CM | POA: Diagnosis not present

## 2015-05-18 DIAGNOSIS — Z79899 Other long term (current) drug therapy: Secondary | ICD-10-CM | POA: Diagnosis not present

## 2015-05-18 DIAGNOSIS — E7800 Pure hypercholesterolemia, unspecified: Secondary | ICD-10-CM | POA: Diagnosis not present

## 2015-05-18 DIAGNOSIS — G8929 Other chronic pain: Secondary | ICD-10-CM | POA: Diagnosis not present

## 2015-05-18 DIAGNOSIS — N289 Disorder of kidney and ureter, unspecified: Secondary | ICD-10-CM | POA: Diagnosis not present

## 2015-05-18 DIAGNOSIS — C8590 Non-Hodgkin lymphoma, unspecified, unspecified site: Secondary | ICD-10-CM | POA: Diagnosis not present

## 2015-05-18 DIAGNOSIS — B9689 Other specified bacterial agents as the cause of diseases classified elsewhere: Secondary | ICD-10-CM | POA: Diagnosis not present

## 2015-05-18 DIAGNOSIS — D649 Anemia, unspecified: Secondary | ICD-10-CM | POA: Diagnosis not present

## 2015-05-18 DIAGNOSIS — J019 Acute sinusitis, unspecified: Secondary | ICD-10-CM | POA: Diagnosis not present

## 2015-06-14 DIAGNOSIS — H4911 Fourth [trochlear] nerve palsy, right eye: Secondary | ICD-10-CM | POA: Diagnosis not present

## 2015-07-19 DIAGNOSIS — G4733 Obstructive sleep apnea (adult) (pediatric): Secondary | ICD-10-CM | POA: Diagnosis not present

## 2015-07-30 DIAGNOSIS — M25511 Pain in right shoulder: Secondary | ICD-10-CM | POA: Diagnosis not present

## 2015-07-30 DIAGNOSIS — Z96651 Presence of right artificial knee joint: Secondary | ICD-10-CM | POA: Diagnosis not present

## 2015-08-08 DIAGNOSIS — Z79899 Other long term (current) drug therapy: Secondary | ICD-10-CM | POA: Diagnosis not present

## 2015-08-15 DIAGNOSIS — N289 Disorder of kidney and ureter, unspecified: Secondary | ICD-10-CM | POA: Diagnosis not present

## 2015-08-15 DIAGNOSIS — R05 Cough: Secondary | ICD-10-CM | POA: Diagnosis not present

## 2015-08-15 DIAGNOSIS — E7800 Pure hypercholesterolemia, unspecified: Secondary | ICD-10-CM | POA: Diagnosis not present

## 2015-08-15 DIAGNOSIS — R0602 Shortness of breath: Secondary | ICD-10-CM | POA: Diagnosis not present

## 2015-08-15 DIAGNOSIS — D649 Anemia, unspecified: Secondary | ICD-10-CM | POA: Diagnosis not present

## 2015-08-15 DIAGNOSIS — J189 Pneumonia, unspecified organism: Secondary | ICD-10-CM | POA: Diagnosis not present

## 2015-08-15 DIAGNOSIS — C8590 Non-Hodgkin lymphoma, unspecified, unspecified site: Secondary | ICD-10-CM | POA: Diagnosis not present

## 2015-08-28 DIAGNOSIS — R05 Cough: Secondary | ICD-10-CM | POA: Diagnosis not present

## 2015-08-28 DIAGNOSIS — J189 Pneumonia, unspecified organism: Secondary | ICD-10-CM | POA: Diagnosis not present

## 2015-08-28 DIAGNOSIS — J181 Lobar pneumonia, unspecified organism: Secondary | ICD-10-CM | POA: Diagnosis not present

## 2015-08-28 DIAGNOSIS — J029 Acute pharyngitis, unspecified: Secondary | ICD-10-CM | POA: Diagnosis not present

## 2015-10-25 DIAGNOSIS — G4733 Obstructive sleep apnea (adult) (pediatric): Secondary | ICD-10-CM | POA: Diagnosis not present

## 2015-11-28 DIAGNOSIS — M2550 Pain in unspecified joint: Secondary | ICD-10-CM | POA: Diagnosis not present

## 2015-11-28 DIAGNOSIS — R5381 Other malaise: Secondary | ICD-10-CM | POA: Diagnosis not present

## 2015-11-28 DIAGNOSIS — Z1329 Encounter for screening for other suspected endocrine disorder: Secondary | ICD-10-CM | POA: Diagnosis not present

## 2015-11-28 DIAGNOSIS — Z125 Encounter for screening for malignant neoplasm of prostate: Secondary | ICD-10-CM | POA: Diagnosis not present

## 2015-11-28 DIAGNOSIS — Z79899 Other long term (current) drug therapy: Secondary | ICD-10-CM | POA: Diagnosis not present

## 2015-11-28 DIAGNOSIS — E7800 Pure hypercholesterolemia, unspecified: Secondary | ICD-10-CM | POA: Diagnosis not present

## 2015-11-28 DIAGNOSIS — C8590 Non-Hodgkin lymphoma, unspecified, unspecified site: Secondary | ICD-10-CM | POA: Diagnosis not present

## 2015-11-28 DIAGNOSIS — D649 Anemia, unspecified: Secondary | ICD-10-CM | POA: Diagnosis not present

## 2015-11-28 DIAGNOSIS — M791 Myalgia: Secondary | ICD-10-CM | POA: Diagnosis not present

## 2015-11-28 DIAGNOSIS — R5383 Other fatigue: Secondary | ICD-10-CM | POA: Diagnosis not present

## 2015-11-28 DIAGNOSIS — N289 Disorder of kidney and ureter, unspecified: Secondary | ICD-10-CM | POA: Diagnosis not present

## 2015-12-12 DIAGNOSIS — H4911 Fourth [trochlear] nerve palsy, right eye: Secondary | ICD-10-CM | POA: Diagnosis not present

## 2015-12-17 DIAGNOSIS — N183 Chronic kidney disease, stage 3 unspecified: Secondary | ICD-10-CM | POA: Insufficient documentation

## 2015-12-17 DIAGNOSIS — C8590 Non-Hodgkin lymphoma, unspecified, unspecified site: Secondary | ICD-10-CM | POA: Diagnosis not present

## 2015-12-17 DIAGNOSIS — G8929 Other chronic pain: Secondary | ICD-10-CM | POA: Insufficient documentation

## 2015-12-17 DIAGNOSIS — K219 Gastro-esophageal reflux disease without esophagitis: Secondary | ICD-10-CM | POA: Diagnosis not present

## 2015-12-17 DIAGNOSIS — M545 Low back pain: Secondary | ICD-10-CM

## 2015-12-17 DIAGNOSIS — M5441 Lumbago with sciatica, right side: Secondary | ICD-10-CM | POA: Diagnosis not present

## 2015-12-17 DIAGNOSIS — M5442 Lumbago with sciatica, left side: Secondary | ICD-10-CM | POA: Diagnosis not present

## 2015-12-17 DIAGNOSIS — E7800 Pure hypercholesterolemia, unspecified: Secondary | ICD-10-CM | POA: Diagnosis not present

## 2015-12-19 ENCOUNTER — Other Ambulatory Visit: Payer: Self-pay | Admitting: Internal Medicine

## 2015-12-19 DIAGNOSIS — C8398 Non-follicular (diffuse) lymphoma, unspecified, lymph nodes of multiple sites: Secondary | ICD-10-CM

## 2015-12-19 DIAGNOSIS — M5441 Lumbago with sciatica, right side: Secondary | ICD-10-CM

## 2015-12-21 DIAGNOSIS — G4733 Obstructive sleep apnea (adult) (pediatric): Secondary | ICD-10-CM | POA: Diagnosis not present

## 2015-12-24 ENCOUNTER — Encounter
Admission: RE | Admit: 2015-12-24 | Discharge: 2015-12-24 | Disposition: A | Discharge status: Home / Self Care | Payer: PPO | Source: Ambulatory Visit | Attending: Internal Medicine | Admitting: Internal Medicine

## 2015-12-24 DIAGNOSIS — M5441 Lumbago with sciatica, right side: Secondary | ICD-10-CM | POA: Diagnosis not present

## 2015-12-24 DIAGNOSIS — C8398 Non-follicular (diffuse) lymphoma, unspecified, lymph nodes of multiple sites: Secondary | ICD-10-CM | POA: Diagnosis not present

## 2015-12-24 DIAGNOSIS — M549 Dorsalgia, unspecified: Secondary | ICD-10-CM | POA: Diagnosis not present

## 2015-12-24 MED ORDER — TECHNETIUM TC 99M MEDRONATE IV KIT
23.3300 | PACK | Freq: Once | INTRAVENOUS | Status: AC | PRN
  Administered 2015-12-24: 23.33 via INTRAVENOUS

## 2016-01-04 DIAGNOSIS — Z23 Encounter for immunization: Secondary | ICD-10-CM | POA: Diagnosis not present

## 2016-01-14 DIAGNOSIS — M25659 Stiffness of unspecified hip, not elsewhere classified: Secondary | ICD-10-CM | POA: Diagnosis not present

## 2016-01-14 DIAGNOSIS — G8929 Other chronic pain: Secondary | ICD-10-CM | POA: Diagnosis not present

## 2016-01-14 DIAGNOSIS — M545 Low back pain: Secondary | ICD-10-CM | POA: Diagnosis not present

## 2016-02-05 DIAGNOSIS — G4733 Obstructive sleep apnea (adult) (pediatric): Secondary | ICD-10-CM | POA: Diagnosis not present

## 2016-03-03 DIAGNOSIS — D649 Anemia, unspecified: Secondary | ICD-10-CM | POA: Diagnosis not present

## 2016-03-03 DIAGNOSIS — C8590 Non-Hodgkin lymphoma, unspecified, unspecified site: Secondary | ICD-10-CM | POA: Diagnosis not present

## 2016-03-03 DIAGNOSIS — N183 Chronic kidney disease, stage 3 (moderate): Secondary | ICD-10-CM | POA: Diagnosis not present

## 2016-03-03 DIAGNOSIS — Z79899 Other long term (current) drug therapy: Secondary | ICD-10-CM | POA: Diagnosis not present

## 2016-03-03 DIAGNOSIS — Z1211 Encounter for screening for malignant neoplasm of colon: Secondary | ICD-10-CM | POA: Diagnosis not present

## 2016-03-03 DIAGNOSIS — M545 Low back pain: Secondary | ICD-10-CM | POA: Diagnosis not present

## 2016-03-03 DIAGNOSIS — G8929 Other chronic pain: Secondary | ICD-10-CM | POA: Diagnosis not present

## 2016-03-07 DIAGNOSIS — Z1211 Encounter for screening for malignant neoplasm of colon: Secondary | ICD-10-CM | POA: Diagnosis not present

## 2016-03-18 DIAGNOSIS — G4733 Obstructive sleep apnea (adult) (pediatric): Secondary | ICD-10-CM | POA: Diagnosis not present

## 2016-04-21 DIAGNOSIS — M48061 Spinal stenosis, lumbar region without neurogenic claudication: Secondary | ICD-10-CM | POA: Diagnosis not present

## 2016-04-21 DIAGNOSIS — M25551 Pain in right hip: Secondary | ICD-10-CM | POA: Diagnosis not present

## 2016-05-19 DIAGNOSIS — M48061 Spinal stenosis, lumbar region without neurogenic claudication: Secondary | ICD-10-CM | POA: Diagnosis not present

## 2016-05-23 DIAGNOSIS — J209 Acute bronchitis, unspecified: Secondary | ICD-10-CM | POA: Diagnosis not present

## 2016-05-23 DIAGNOSIS — R05 Cough: Secondary | ICD-10-CM | POA: Diagnosis not present

## 2016-05-23 DIAGNOSIS — J440 Chronic obstructive pulmonary disease with acute lower respiratory infection: Secondary | ICD-10-CM | POA: Diagnosis not present

## 2016-06-09 DIAGNOSIS — H4911 Fourth [trochlear] nerve palsy, right eye: Secondary | ICD-10-CM | POA: Diagnosis not present

## 2016-06-19 ENCOUNTER — Other Ambulatory Visit: Payer: Self-pay | Admitting: Internal Medicine

## 2016-06-19 DIAGNOSIS — D649 Anemia, unspecified: Secondary | ICD-10-CM | POA: Diagnosis not present

## 2016-06-19 DIAGNOSIS — R7309 Other abnormal glucose: Secondary | ICD-10-CM | POA: Diagnosis not present

## 2016-06-19 DIAGNOSIS — Z79899 Other long term (current) drug therapy: Secondary | ICD-10-CM | POA: Diagnosis not present

## 2016-06-19 DIAGNOSIS — R05 Cough: Secondary | ICD-10-CM | POA: Diagnosis not present

## 2016-06-19 DIAGNOSIS — R053 Chronic cough: Secondary | ICD-10-CM

## 2016-06-19 DIAGNOSIS — N183 Chronic kidney disease, stage 3 (moderate): Secondary | ICD-10-CM | POA: Diagnosis not present

## 2016-06-19 DIAGNOSIS — C8590 Non-Hodgkin lymphoma, unspecified, unspecified site: Secondary | ICD-10-CM | POA: Diagnosis not present

## 2016-06-19 DIAGNOSIS — E7800 Pure hypercholesterolemia, unspecified: Secondary | ICD-10-CM | POA: Diagnosis not present

## 2016-07-01 ENCOUNTER — Ambulatory Visit
Admission: RE | Admit: 2016-07-01 | Discharge: 2016-07-01 | Disposition: A | Discharge status: Home / Self Care | Payer: PPO | Source: Ambulatory Visit | Attending: Internal Medicine | Admitting: Internal Medicine

## 2016-07-01 DIAGNOSIS — R05 Cough: Secondary | ICD-10-CM | POA: Insufficient documentation

## 2016-07-01 DIAGNOSIS — R918 Other nonspecific abnormal finding of lung field: Secondary | ICD-10-CM | POA: Diagnosis not present

## 2016-07-01 DIAGNOSIS — R053 Chronic cough: Secondary | ICD-10-CM

## 2016-07-15 DIAGNOSIS — G4733 Obstructive sleep apnea (adult) (pediatric): Secondary | ICD-10-CM | POA: Diagnosis not present

## 2016-07-18 DIAGNOSIS — G4733 Obstructive sleep apnea (adult) (pediatric): Secondary | ICD-10-CM | POA: Diagnosis not present

## 2016-07-21 DIAGNOSIS — M25551 Pain in right hip: Secondary | ICD-10-CM | POA: Diagnosis not present

## 2016-07-21 DIAGNOSIS — Z96651 Presence of right artificial knee joint: Secondary | ICD-10-CM | POA: Diagnosis not present

## 2016-07-21 DIAGNOSIS — Z96611 Presence of right artificial shoulder joint: Secondary | ICD-10-CM | POA: Diagnosis not present

## 2016-07-21 DIAGNOSIS — M5416 Radiculopathy, lumbar region: Secondary | ICD-10-CM | POA: Diagnosis not present

## 2016-07-26 DIAGNOSIS — M545 Low back pain: Secondary | ICD-10-CM | POA: Diagnosis not present

## 2016-08-05 DIAGNOSIS — M545 Low back pain: Secondary | ICD-10-CM | POA: Diagnosis not present

## 2016-08-05 DIAGNOSIS — M48061 Spinal stenosis, lumbar region without neurogenic claudication: Secondary | ICD-10-CM | POA: Diagnosis not present

## 2016-08-05 DIAGNOSIS — M47816 Spondylosis without myelopathy or radiculopathy, lumbar region: Secondary | ICD-10-CM | POA: Diagnosis not present

## 2016-08-15 DIAGNOSIS — M48061 Spinal stenosis, lumbar region without neurogenic claudication: Secondary | ICD-10-CM | POA: Diagnosis not present

## 2016-08-15 DIAGNOSIS — M47816 Spondylosis without myelopathy or radiculopathy, lumbar region: Secondary | ICD-10-CM | POA: Diagnosis not present

## 2016-09-02 DIAGNOSIS — M48061 Spinal stenosis, lumbar region without neurogenic claudication: Secondary | ICD-10-CM | POA: Diagnosis not present

## 2016-09-02 DIAGNOSIS — M545 Low back pain: Secondary | ICD-10-CM | POA: Diagnosis not present

## 2016-09-02 DIAGNOSIS — M47816 Spondylosis without myelopathy or radiculopathy, lumbar region: Secondary | ICD-10-CM | POA: Diagnosis not present

## 2016-09-03 DIAGNOSIS — M545 Low back pain: Secondary | ICD-10-CM | POA: Diagnosis not present

## 2016-09-08 DIAGNOSIS — M545 Low back pain: Secondary | ICD-10-CM | POA: Diagnosis not present

## 2016-09-10 DIAGNOSIS — M545 Low back pain: Secondary | ICD-10-CM | POA: Diagnosis not present

## 2016-09-15 DIAGNOSIS — M545 Low back pain: Secondary | ICD-10-CM | POA: Diagnosis not present

## 2016-09-17 DIAGNOSIS — M545 Low back pain: Secondary | ICD-10-CM | POA: Diagnosis not present

## 2016-09-22 DIAGNOSIS — M545 Low back pain: Secondary | ICD-10-CM | POA: Diagnosis not present

## 2016-09-23 DIAGNOSIS — C8590 Non-Hodgkin lymphoma, unspecified, unspecified site: Secondary | ICD-10-CM | POA: Diagnosis not present

## 2016-09-23 DIAGNOSIS — D649 Anemia, unspecified: Secondary | ICD-10-CM | POA: Diagnosis not present

## 2016-09-23 DIAGNOSIS — E7800 Pure hypercholesterolemia, unspecified: Secondary | ICD-10-CM | POA: Diagnosis not present

## 2016-09-23 DIAGNOSIS — M545 Low back pain: Secondary | ICD-10-CM | POA: Diagnosis not present

## 2016-09-23 DIAGNOSIS — Z0000 Encounter for general adult medical examination without abnormal findings: Secondary | ICD-10-CM | POA: Diagnosis not present

## 2016-09-23 DIAGNOSIS — G8929 Other chronic pain: Secondary | ICD-10-CM | POA: Diagnosis not present

## 2016-09-23 DIAGNOSIS — Z79899 Other long term (current) drug therapy: Secondary | ICD-10-CM | POA: Diagnosis not present

## 2016-09-23 DIAGNOSIS — N183 Chronic kidney disease, stage 3 (moderate): Secondary | ICD-10-CM | POA: Diagnosis not present

## 2016-09-29 DIAGNOSIS — M545 Low back pain: Secondary | ICD-10-CM | POA: Diagnosis not present

## 2016-10-14 DIAGNOSIS — M48061 Spinal stenosis, lumbar region without neurogenic claudication: Secondary | ICD-10-CM | POA: Diagnosis not present

## 2016-10-14 DIAGNOSIS — M47816 Spondylosis without myelopathy or radiculopathy, lumbar region: Secondary | ICD-10-CM | POA: Diagnosis not present

## 2016-10-14 DIAGNOSIS — M545 Low back pain: Secondary | ICD-10-CM | POA: Diagnosis not present

## 2016-10-24 DIAGNOSIS — M545 Low back pain: Secondary | ICD-10-CM | POA: Diagnosis not present

## 2016-10-24 DIAGNOSIS — M47816 Spondylosis without myelopathy or radiculopathy, lumbar region: Secondary | ICD-10-CM | POA: Diagnosis not present

## 2016-10-24 DIAGNOSIS — M48061 Spinal stenosis, lumbar region without neurogenic claudication: Secondary | ICD-10-CM | POA: Diagnosis not present

## 2016-11-07 DIAGNOSIS — Z9889 Other specified postprocedural states: Secondary | ICD-10-CM | POA: Diagnosis not present

## 2016-12-01 DIAGNOSIS — M47816 Spondylosis without myelopathy or radiculopathy, lumbar region: Secondary | ICD-10-CM | POA: Diagnosis not present

## 2016-12-01 DIAGNOSIS — M545 Low back pain: Secondary | ICD-10-CM | POA: Diagnosis not present

## 2016-12-01 DIAGNOSIS — M48061 Spinal stenosis, lumbar region without neurogenic claudication: Secondary | ICD-10-CM | POA: Diagnosis not present

## 2017-01-13 DIAGNOSIS — G4733 Obstructive sleep apnea (adult) (pediatric): Secondary | ICD-10-CM | POA: Diagnosis not present

## 2017-03-04 ENCOUNTER — Other Ambulatory Visit: Payer: Self-pay | Admitting: Pharmacy Technician

## 2017-03-04 NOTE — Patient Outreach (Signed)
[name]) Care Management  03/04/2017  Gavin Maxwell 12/01/1945 [id number]   Incoming [employer] Emmi call in reference to medication adherence. HIPAA identifiers verified and verbal consent received. I spoke to [name] about his Atorvastatin 80 mg and he states that he takes all of his  medication's daily as prescribed. I followed up with [facility] and the patient had his Atorvastatin filled 5/2, 8/6, and 11/6. The patient does not have any barriers or concerns at this time.  Doreene Burke, [place] [phone]

## 2017-03-26 DIAGNOSIS — R7309 Other abnormal glucose: Secondary | ICD-10-CM | POA: Diagnosis not present

## 2017-03-26 DIAGNOSIS — C8590 Non-Hodgkin lymphoma, unspecified, unspecified site: Secondary | ICD-10-CM | POA: Diagnosis not present

## 2017-03-26 DIAGNOSIS — Z125 Encounter for screening for malignant neoplasm of prostate: Secondary | ICD-10-CM | POA: Diagnosis not present

## 2017-03-26 DIAGNOSIS — R55 Syncope and collapse: Secondary | ICD-10-CM | POA: Diagnosis not present

## 2017-03-26 DIAGNOSIS — D649 Anemia, unspecified: Secondary | ICD-10-CM | POA: Diagnosis not present

## 2017-03-26 DIAGNOSIS — Z79899 Other long term (current) drug therapy: Secondary | ICD-10-CM | POA: Diagnosis not present

## 2017-03-26 DIAGNOSIS — Z1211 Encounter for screening for malignant neoplasm of colon: Secondary | ICD-10-CM | POA: Diagnosis not present

## 2017-03-26 DIAGNOSIS — R002 Palpitations: Secondary | ICD-10-CM | POA: Diagnosis not present

## 2017-03-26 DIAGNOSIS — N183 Chronic kidney disease, stage 3 (moderate): Secondary | ICD-10-CM | POA: Diagnosis not present

## 2017-03-26 DIAGNOSIS — M79602 Pain in left arm: Secondary | ICD-10-CM | POA: Diagnosis not present

## 2017-03-26 DIAGNOSIS — E7800 Pure hypercholesterolemia, unspecified: Secondary | ICD-10-CM | POA: Diagnosis not present

## 2017-03-27 DIAGNOSIS — R55 Syncope and collapse: Secondary | ICD-10-CM | POA: Diagnosis not present

## 2017-03-27 DIAGNOSIS — E7800 Pure hypercholesterolemia, unspecified: Secondary | ICD-10-CM | POA: Diagnosis not present

## 2017-04-06 DIAGNOSIS — Z1211 Encounter for screening for malignant neoplasm of colon: Secondary | ICD-10-CM | POA: Diagnosis not present

## 2017-04-08 DIAGNOSIS — R55 Syncope and collapse: Secondary | ICD-10-CM | POA: Diagnosis not present

## 2017-04-09 DIAGNOSIS — I6523 Occlusion and stenosis of bilateral carotid arteries: Secondary | ICD-10-CM | POA: Diagnosis not present

## 2017-04-09 DIAGNOSIS — R55 Syncope and collapse: Secondary | ICD-10-CM | POA: Diagnosis not present

## 2017-04-14 DIAGNOSIS — R55 Syncope and collapse: Secondary | ICD-10-CM | POA: Diagnosis not present

## 2017-04-14 DIAGNOSIS — N183 Chronic kidney disease, stage 3 (moderate): Secondary | ICD-10-CM | POA: Diagnosis not present

## 2017-04-14 DIAGNOSIS — I10 Essential (primary) hypertension: Secondary | ICD-10-CM | POA: Insufficient documentation

## 2017-04-14 DIAGNOSIS — E7800 Pure hypercholesterolemia, unspecified: Secondary | ICD-10-CM | POA: Diagnosis not present

## 2017-05-25 DIAGNOSIS — G5601 Carpal tunnel syndrome, right upper limb: Secondary | ICD-10-CM | POA: Diagnosis not present

## 2017-06-03 DIAGNOSIS — G4733 Obstructive sleep apnea (adult) (pediatric): Secondary | ICD-10-CM | POA: Diagnosis not present

## 2017-06-24 DIAGNOSIS — G5601 Carpal tunnel syndrome, right upper limb: Secondary | ICD-10-CM | POA: Diagnosis not present

## 2017-06-30 DIAGNOSIS — C8590 Non-Hodgkin lymphoma, unspecified, unspecified site: Secondary | ICD-10-CM | POA: Diagnosis not present

## 2017-06-30 DIAGNOSIS — Z79899 Other long term (current) drug therapy: Secondary | ICD-10-CM | POA: Diagnosis not present

## 2017-06-30 DIAGNOSIS — Z0000 Encounter for general adult medical examination without abnormal findings: Secondary | ICD-10-CM | POA: Diagnosis not present

## 2017-06-30 DIAGNOSIS — N183 Chronic kidney disease, stage 3 (moderate): Secondary | ICD-10-CM | POA: Diagnosis not present

## 2017-06-30 DIAGNOSIS — R7309 Other abnormal glucose: Secondary | ICD-10-CM | POA: Diagnosis not present

## 2017-06-30 DIAGNOSIS — E7800 Pure hypercholesterolemia, unspecified: Secondary | ICD-10-CM | POA: Diagnosis not present

## 2017-06-30 DIAGNOSIS — D649 Anemia, unspecified: Secondary | ICD-10-CM | POA: Diagnosis not present

## 2017-06-30 DIAGNOSIS — I10 Essential (primary) hypertension: Secondary | ICD-10-CM | POA: Diagnosis not present

## 2017-07-14 DIAGNOSIS — G4733 Obstructive sleep apnea (adult) (pediatric): Secondary | ICD-10-CM | POA: Diagnosis not present

## 2017-07-19 DIAGNOSIS — J069 Acute upper respiratory infection, unspecified: Secondary | ICD-10-CM | POA: Diagnosis not present

## 2017-08-06 DIAGNOSIS — H2511 Age-related nuclear cataract, right eye: Secondary | ICD-10-CM | POA: Diagnosis not present

## 2017-08-11 ENCOUNTER — Emergency Department: Payer: PPO

## 2017-08-11 ENCOUNTER — Other Ambulatory Visit: Payer: Self-pay

## 2017-08-11 ENCOUNTER — Inpatient Hospital Stay
Admission: EM | Admit: 2017-08-11 | Discharge: 2017-08-21 | DRG: 163 | Disposition: A | Discharge status: Home / Self Care | Payer: PPO | Attending: Cardiothoracic Surgery | Admitting: Cardiothoracic Surgery

## 2017-08-11 ENCOUNTER — Encounter: Payer: Self-pay | Admitting: *Deleted

## 2017-08-11 DIAGNOSIS — J9601 Acute respiratory failure with hypoxia: Secondary | ICD-10-CM | POA: Diagnosis present

## 2017-08-11 DIAGNOSIS — Z888 Allergy status to other drugs, medicaments and biological substances status: Secondary | ICD-10-CM | POA: Diagnosis not present

## 2017-08-11 DIAGNOSIS — Z87891 Personal history of nicotine dependence: Secondary | ICD-10-CM

## 2017-08-11 DIAGNOSIS — Z6832 Body mass index (BMI) 32.0-32.9, adult: Secondary | ICD-10-CM

## 2017-08-11 DIAGNOSIS — Z4682 Encounter for fitting and adjustment of non-vascular catheter: Secondary | ICD-10-CM | POA: Diagnosis not present

## 2017-08-11 DIAGNOSIS — Z885 Allergy status to narcotic agent status: Secondary | ICD-10-CM

## 2017-08-11 DIAGNOSIS — J930 Spontaneous tension pneumothorax: Secondary | ICD-10-CM | POA: Diagnosis not present

## 2017-08-11 DIAGNOSIS — K219 Gastro-esophageal reflux disease without esophagitis: Secondary | ICD-10-CM | POA: Diagnosis not present

## 2017-08-11 DIAGNOSIS — I959 Hypotension, unspecified: Secondary | ICD-10-CM | POA: Diagnosis not present

## 2017-08-11 DIAGNOSIS — J9311 Primary spontaneous pneumothorax: Secondary | ICD-10-CM | POA: Diagnosis not present

## 2017-08-11 DIAGNOSIS — N183 Chronic kidney disease, stage 3 (moderate): Secondary | ICD-10-CM | POA: Diagnosis not present

## 2017-08-11 DIAGNOSIS — J939 Pneumothorax, unspecified: Secondary | ICD-10-CM

## 2017-08-11 DIAGNOSIS — I471 Supraventricular tachycardia: Secondary | ICD-10-CM | POA: Diagnosis not present

## 2017-08-11 DIAGNOSIS — N179 Acute kidney failure, unspecified: Secondary | ICD-10-CM | POA: Diagnosis not present

## 2017-08-11 DIAGNOSIS — R7989 Other specified abnormal findings of blood chemistry: Secondary | ICD-10-CM

## 2017-08-11 DIAGNOSIS — J439 Emphysema, unspecified: Secondary | ICD-10-CM | POA: Diagnosis not present

## 2017-08-11 DIAGNOSIS — R0789 Other chest pain: Secondary | ICD-10-CM | POA: Diagnosis not present

## 2017-08-11 DIAGNOSIS — J95811 Postprocedural pneumothorax: Secondary | ICD-10-CM

## 2017-08-11 DIAGNOSIS — I129 Hypertensive chronic kidney disease with stage 1 through stage 4 chronic kidney disease, or unspecified chronic kidney disease: Secondary | ICD-10-CM | POA: Diagnosis not present

## 2017-08-11 DIAGNOSIS — F329 Major depressive disorder, single episode, unspecified: Secondary | ICD-10-CM | POA: Diagnosis present

## 2017-08-11 DIAGNOSIS — R0602 Shortness of breath: Secondary | ICD-10-CM

## 2017-08-11 DIAGNOSIS — R079 Chest pain, unspecified: Secondary | ICD-10-CM | POA: Diagnosis not present

## 2017-08-11 DIAGNOSIS — G629 Polyneuropathy, unspecified: Secondary | ICD-10-CM | POA: Diagnosis present

## 2017-08-11 DIAGNOSIS — G4733 Obstructive sleep apnea (adult) (pediatric): Secondary | ICD-10-CM | POA: Diagnosis present

## 2017-08-11 DIAGNOSIS — Z96611 Presence of right artificial shoulder joint: Secondary | ICD-10-CM | POA: Diagnosis present

## 2017-08-11 DIAGNOSIS — Z9049 Acquired absence of other specified parts of digestive tract: Secondary | ICD-10-CM | POA: Diagnosis not present

## 2017-08-11 DIAGNOSIS — Z8572 Personal history of non-Hodgkin lymphomas: Secondary | ICD-10-CM

## 2017-08-11 DIAGNOSIS — R000 Tachycardia, unspecified: Secondary | ICD-10-CM | POA: Diagnosis not present

## 2017-08-11 DIAGNOSIS — Z96651 Presence of right artificial knee joint: Secondary | ICD-10-CM | POA: Diagnosis not present

## 2017-08-11 DIAGNOSIS — J9382 Other air leak: Secondary | ICD-10-CM | POA: Diagnosis present

## 2017-08-11 DIAGNOSIS — R531 Weakness: Secondary | ICD-10-CM | POA: Diagnosis present

## 2017-08-11 DIAGNOSIS — I10 Essential (primary) hypertension: Secondary | ICD-10-CM | POA: Diagnosis not present

## 2017-08-11 DIAGNOSIS — E669 Obesity, unspecified: Secondary | ICD-10-CM | POA: Diagnosis present

## 2017-08-11 DIAGNOSIS — Z79891 Long term (current) use of opiate analgesic: Secondary | ICD-10-CM

## 2017-08-11 DIAGNOSIS — J9312 Secondary spontaneous pneumothorax: Secondary | ICD-10-CM | POA: Diagnosis not present

## 2017-08-11 DIAGNOSIS — J9383 Other pneumothorax: Secondary | ICD-10-CM

## 2017-08-11 DIAGNOSIS — F411 Generalized anxiety disorder: Secondary | ICD-10-CM | POA: Diagnosis not present

## 2017-08-11 DIAGNOSIS — Z79899 Other long term (current) drug therapy: Secondary | ICD-10-CM

## 2017-08-11 DIAGNOSIS — Z7982 Long term (current) use of aspirin: Secondary | ICD-10-CM

## 2017-08-11 DIAGNOSIS — E785 Hyperlipidemia, unspecified: Secondary | ICD-10-CM | POA: Diagnosis present

## 2017-08-11 DIAGNOSIS — Z09 Encounter for follow-up examination after completed treatment for conditions other than malignant neoplasm: Secondary | ICD-10-CM

## 2017-08-11 LAB — CBC
HCT: 38.6 % — ABNORMAL LOW (ref 40.0–52.0)
Hemoglobin: 13.4 g/dL (ref 13.0–18.0)
MCH: 32.1 pg (ref 26.0–34.0)
MCHC: 34.7 g/dL (ref 32.0–36.0)
MCV: 92.4 fL (ref 80.0–100.0)
PLATELETS: 224 10*3/uL (ref 150–440)
RBC: 4.18 MIL/uL — AB (ref 4.40–5.90)
RDW: 13.7 % (ref 11.5–14.5)
WBC: 5.3 10*3/uL (ref 3.8–10.6)

## 2017-08-11 LAB — BASIC METABOLIC PANEL
Anion gap: 9 (ref 5–15)
BUN: 31 mg/dL — ABNORMAL HIGH (ref 6–20)
CALCIUM: 9.5 mg/dL (ref 8.9–10.3)
CO2: 25 mmol/L (ref 22–32)
CREATININE: 1.61 mg/dL — AB (ref 0.61–1.24)
Chloride: 103 mmol/L (ref 101–111)
GFR calc non Af Amer: 40 mL/min — ABNORMAL LOW (ref >60)
GFR, EST AFRICAN AMERICAN: 47 mL/min — AB (ref >60)
Glucose, Bld: 103 mg/dL — ABNORMAL HIGH (ref 65–99)
Potassium: 4.1 mmol/L (ref 3.5–5.1)
Sodium: 137 mmol/L (ref 135–145)

## 2017-08-11 LAB — TROPONIN I: Troponin I: <0.03 ng/mL (ref <0.03)

## 2017-08-11 MED ORDER — GABAPENTIN 300 MG PO CAPS
300.0000 mg | ORAL_CAPSULE | Freq: Two times a day (BID) | ORAL | Status: DC
  Administered 2017-08-11 – 2017-08-19 (×15): 300 mg via ORAL
  Filled 2017-08-11 (×15): qty 1

## 2017-08-11 MED ORDER — ONDANSETRON HCL 4 MG/2ML IJ SOLN: 4.0000 mg | Freq: Four times a day (QID) | INTRAMUSCULAR | Status: DC | PRN

## 2017-08-11 MED ORDER — ENOXAPARIN SODIUM 40 MG/0.4ML ~~LOC~~ SOLN
40.0000 mg | SUBCUTANEOUS | Status: DC
  Administered 2017-08-11: 40 mg via SUBCUTANEOUS
  Filled 2017-08-11: qty 0.4

## 2017-08-11 MED ORDER — ACETAMINOPHEN 650 MG RE SUPP: 650.0000 mg | Freq: Four times a day (QID) | RECTAL | Status: DC | PRN

## 2017-08-11 MED ORDER — HYDROCODONE-ACETAMINOPHEN 5-325 MG PO TABS
1.0000 | ORAL_TABLET | Freq: Four times a day (QID) | ORAL | Status: DC | PRN
  Administered 2017-08-11 – 2017-08-18 (×6): 1 via ORAL
  Filled 2017-08-11 (×6): qty 1

## 2017-08-11 MED ORDER — SERTRALINE HCL 50 MG PO TABS
50.0000 mg | ORAL_TABLET | Freq: Every day | ORAL | Status: DC
  Administered 2017-08-11 – 2017-08-20 (×10): 50 mg via ORAL
  Filled 2017-08-11 (×10): qty 1

## 2017-08-11 MED ORDER — OXYCODONE HCL 5 MG PO TABS: 5.0000 mg | ORAL_TABLET | ORAL | Status: DC | PRN

## 2017-08-11 MED ORDER — ACETAMINOPHEN 325 MG PO TABS: 650.0000 mg | ORAL_TABLET | Freq: Four times a day (QID) | ORAL | Status: DC | PRN

## 2017-08-11 MED ORDER — HYDRALAZINE HCL 20 MG/ML IJ SOLN: 10.0000 mg | Freq: Four times a day (QID) | INTRAMUSCULAR | Status: DC | PRN

## 2017-08-11 MED ORDER — BUPIVACAINE HCL 0.5 % IJ SOLN
50.0000 mL | Freq: Once | INTRAMUSCULAR | Status: AC
  Administered 2017-08-11: 50 mL
  Filled 2017-08-11: qty 50

## 2017-08-11 MED ORDER — PANTOPRAZOLE SODIUM 40 MG PO TBEC
40.0000 mg | DELAYED_RELEASE_TABLET | Freq: Every day | ORAL | Status: DC
  Administered 2017-08-12 – 2017-08-21 (×9): 40 mg via ORAL
  Filled 2017-08-11 (×9): qty 1

## 2017-08-11 MED ORDER — ALBUTEROL SULFATE (2.5 MG/3ML) 0.083% IN NEBU
2.5000 mg | INHALATION_SOLUTION | RESPIRATORY_TRACT | Status: DC | PRN
  Administered 2017-08-19: 2.5 mg via RESPIRATORY_TRACT
  Filled 2017-08-11: qty 3

## 2017-08-11 MED ORDER — FERROUS SULFATE 325 (65 FE) MG PO TABS
325.0000 mg | ORAL_TABLET | Freq: Every day | ORAL | Status: DC
  Administered 2017-08-12 – 2017-08-21 (×9): 325 mg via ORAL
  Filled 2017-08-11 (×9): qty 1

## 2017-08-11 MED ORDER — ASPIRIN EC 81 MG PO TBEC: 81.0000 mg | DELAYED_RELEASE_TABLET | Freq: Every morning | ORAL | Status: DC

## 2017-08-11 MED ORDER — SENNOSIDES-DOCUSATE SODIUM 8.6-50 MG PO TABS
2.0000 | ORAL_TABLET | Freq: Every day | ORAL | Status: DC
  Administered 2017-08-12 – 2017-08-21 (×9): 2 via ORAL
  Filled 2017-08-11 (×9): qty 2

## 2017-08-11 MED ORDER — POLYETHYLENE GLYCOL 3350 17 G PO PACK
17.0000 g | PACK | Freq: Every day | ORAL | Status: DC | PRN
Start: 2017-08-11 — End: 2017-08-21

## 2017-08-11 MED ORDER — FENTANYL CITRATE (PF) 100 MCG/2ML IJ SOLN
50.0000 ug | Freq: Once | INTRAMUSCULAR | Status: AC
  Administered 2017-08-11: 50 ug via INTRAVENOUS
  Filled 2017-08-11: qty 2

## 2017-08-11 MED ORDER — ATORVASTATIN CALCIUM 20 MG PO TABS
80.0000 mg | ORAL_TABLET | Freq: Every day | ORAL | Status: DC
  Administered 2017-08-12 – 2017-08-20 (×9): 80 mg via ORAL
  Filled 2017-08-11 (×9): qty 4

## 2017-08-11 MED ORDER — FENOFIBRATE 160 MG PO TABS
160.0000 mg | ORAL_TABLET | Freq: Every morning | ORAL | Status: DC
  Administered 2017-08-12 – 2017-08-21 (×9): 160 mg via ORAL
  Filled 2017-08-11 (×10): qty 1

## 2017-08-11 MED ORDER — BUPIVACAINE HCL (PF) 0.5 % IJ SOLN
INTRAMUSCULAR | Status: AC
  Administered 2017-08-11: 50 mL
  Filled 2017-08-11: qty 30

## 2017-08-11 MED ORDER — ONDANSETRON HCL 4 MG PO TABS: 4.0000 mg | ORAL_TABLET | Freq: Four times a day (QID) | ORAL | Status: DC | PRN

## 2017-08-11 MED ORDER — FLUTICASONE PROPIONATE 50 MCG/ACT NA SUSP
2.0000 | Freq: Every day | NASAL | Status: DC
  Administered 2017-08-12 – 2017-08-21 (×9): 2 via NASAL
  Filled 2017-08-11: qty 16

## 2017-08-11 MED ORDER — METOPROLOL SUCCINATE ER 25 MG PO TB24
25.0000 mg | ORAL_TABLET | Freq: Every evening | ORAL | Status: DC
  Administered 2017-08-12 – 2017-08-19 (×8): 25 mg via ORAL
  Filled 2017-08-11 (×9): qty 1

## 2017-08-11 NOTE — H&P (Signed)
[place] [facility] [place] NAME: Gavin Maxwell    MR#:  [id number]  DATE OF BIRTH:  [birth year]  DATE OF ADMISSION:  08/11/2017  PRIMARY CARE PHYSICIAN: Idelle Crouch, MD   REQUESTING/REFERRING PHYSICIAN: Dr. Archie Balboa  CHIEF COMPLAINT:   Chief Complaint  Patient presents with  . Shortness of Breath    HISTORY OF PRESENT ILLNESS:  Gavin Maxwell  is a 72 y.o. male with a known history of hypertension, non-Hodgkin's lymphoma, past tobacco use presents to the emergency room with shortness of breath and chest pain.  Patient's chest x-ray showed large right-sided pneumothorax.  He has chest tube placed.  With good response.  Continues to be on 2 L oxygen.  Now has right-sided chest pain along with pain at the site of chest tube.  Patient is being admitted.  No history of pneumothorax in the past.  PAST MEDICAL HISTORY:   Past Medical History:  Diagnosis Date  . Anxiety   . Cancer ([place])    non hodgins  lymphoma  . Depression   . GERD (gastroesophageal reflux disease)   . History of hiatal hernia   . Hypertension   . Primary localized osteoarthritis of knee 09/26/2014  . Primary localized osteoarthrosis, shoulder region, rotator cuff arthropathy 10/04/2013  . Sleep apnea    cpap    PAST SURGICAL HISTORY:   Past Surgical History:  Procedure Laterality Date  . CARDIAC CATHETERIZATION     20 yrs. ago  . CHOLECYSTECTOMY    . EYE SURGERY    . JOINT REPLACEMENT     hip  . NASAL SINUS SURGERY     x2  . REVERSE SHOULDER ARTHROPLASTY Right 10/04/2013   Procedure: REVERSE SHOULDER ARTHROPLASTY;  Surgeon: Johnny Bridge, MD;  Location: [place];  Service: Orthopedics;  Laterality: Right;  . SHOULDER ACROMIOPLASTY     x 5 shoulder surgeries  . TOTAL KNEE ARTHROPLASTY Right 09/26/2014  . TOTAL KNEE ARTHROPLASTY Right 09/26/2014   Procedure: RIGHT TOTAL KNEE ARTHROPLASTY;  Surgeon: Marchia Bond, MD;  Location: [place];  Service: Orthopedics;   Laterality: Right;    SOCIAL HISTORY:   Social History   Tobacco Use  . Smoking status: Former Smoker    Packs/day: 2.00    Years: 20.00    Pack years: 40.00    Types: Cigarettes  . Smokeless tobacco: Never Used  Substance Use Topics  . Alcohol use: No    FAMILY HISTORY:  History reviewed. No pertinent family history.  DRUG ALLERGIES:   Allergies  Allergen Reactions  . Augmentin [Amoxicillin-Pot Clavulanate] Nausea And Vomiting  . Celebrex [Celecoxib] Nausea And Vomiting    REVIEW OF SYSTEMS:   Review of Systems  Constitutional: Positive for malaise/fatigue. Negative for chills, fever and weight loss.  HENT: Negative for hearing loss and nosebleeds.   Eyes: Negative for blurred vision, double vision and pain.  Respiratory: Positive for cough and shortness of breath. Negative for hemoptysis, sputum production and wheezing.   Cardiovascular: Positive for chest pain. Negative for palpitations, orthopnea and leg swelling.  Gastrointestinal: Negative for abdominal pain, constipation, diarrhea, nausea and vomiting.  Genitourinary: Negative for dysuria and hematuria.  Musculoskeletal: Negative for back pain, falls and myalgias.  Skin: Negative for rash.  Neurological: Negative for dizziness, tremors, sensory change, speech change, focal weakness, seizures and headaches.  Endo/Heme/Allergies: Does not bruise/bleed easily.  Psychiatric/Behavioral: Negative for depression and memory loss. The patient is not nervous/anxious.     MEDICATIONS AT  HOME:   Prior to Admission medications   Medication Sig Start Date End Date Taking? Authorizing Provider  aspirin EC 81 MG tablet Take 81 mg by mouth every morning.   Yes [provider]  atorvastatin (LIPITOR) 80 MG tablet Take 80 mg by mouth daily with supper.   Yes [provider]  Calcium Carbonate (CALCIUM 600 PO) Take 600 mg by mouth daily.   Yes [provider]  fenofibrate 160 MG tablet Take 160 mg by  mouth every morning.   Yes [provider]  ferrous sulfate (IRON SUPPLEMENT) 325 (65 FE) MG tablet Take 325 mg by mouth daily.   Yes [provider]  fluticasone (FLONASE) 50 MCG/ACT nasal spray Place 2 sprays into both nostrils daily.    Yes [provider]  gabapentin (NEURONTIN) 300 MG capsule Take 300 mg by mouth 2 (two) times daily.   Yes [provider]  metoprolol succinate (TOPROL-XL) 25 MG 24 hr tablet Take 25 mg by mouth every evening.    Yes [provider]  pantoprazole (PROTONIX) 40 MG tablet Take 40 mg by mouth daily.    Yes [provider]  sertraline (ZOLOFT) 50 MG tablet Take 50 mg by mouth at bedtime.   Yes [provider]  baclofen (LIORESAL) 10 MG tablet Take 1 tablet (10 mg total) by mouth 3 (three) times daily. As needed for muscle spasm Patient not taking: Reported on 08/11/2017 09/26/14   Marchia Bond, MD  HYDROcodone-acetaminophen [facility]) 10-325 MG per tablet Take 1-2 tablets by mouth every 6 (six) hours as needed. Patient not taking: Reported on 08/11/2017 09/26/14   Marchia Bond, MD  ondansetron (ZOFRAN) 4 MG tablet Take 1 tablet (4 mg total) by mouth every 8 (eight) hours as needed for nausea or vomiting. Patient not taking: Reported on 08/11/2017 09/26/14   Marchia Bond, MD  sennosides-docusate sodium (SENOKOT-S) 8.6-50 MG tablet Take 2 tablets by mouth daily. Patient not taking: Reported on 08/11/2017 09/26/14   Marchia Bond, MD     VITAL SIGNS:  Blood pressure (!) 146/83, pulse 68, temperature 98.4 F (36.9 C), temperature source Oral, resp. rate 20, height 5\' 11"  (1.803 m), weight 107.2 kg (236 lb 4.8 oz), SpO2 98 %.  PHYSICAL EXAMINATION:  Physical Exam  GENERAL:  72 y.o.-year-old patient lying in the bed with no acute distress.  EYES: Pupils equal, round, reactive to light and accommodation. No scleral icterus. Extraocular muscles intact.  HEENT: Head atraumatic, normocephalic. Oropharynx and  nasopharynx clear. No oropharyngeal erythema, moist oral mucosa  NECK:  Supple, no jugular venous distention. No thyroid enlargement, no tenderness.  LUNGS: Normal breath sounds bilaterally, no wheezing, rales, rhonchi. No use of accessory muscles of respiration.  Right chest tube in place CARDIOVASCULAR: S1, S2 normal. No murmurs, rubs, or gallops.  ABDOMEN: Soft, nontender, nondistended. Bowel sounds present. No organomegaly or mass.  EXTREMITIES: No pedal edema, cyanosis, or clubbing. + 2 pedal & radial pulses b/l.   NEUROLOGIC: Cranial nerves II through XII are intact. No focal Motor or sensory deficits appreciated b/l PSYCHIATRIC: The patient is alert and oriented x 3. Good affect.  SKIN: No obvious rash, lesion, or ulcer.   LABORATORY PANEL:   CBC Recent Labs  Lab 08/11/17 1901  WBC 5.3  HGB 13.4  HCT 38.6*  PLT 224   ------------------------------------------------------------------------------------------------------------------  Chemistries  Recent Labs  Lab 08/11/17 1901  NA 137  K 4.1  CL 103  CO2 25  GLUCOSE 103*  BUN 31*  CREATININE 1.61*  CALCIUM 9.5   ------------------------------------------------------------------------------------------------------------------  Cardiac Enzymes Recent Labs  Lab 08/11/17 1901  TROPONINI <0.03   ------------------------------------------------------------------------------------------------------------------  RADIOLOGY:  Dg Chest Portable 1 View  Result Date: 08/11/2017 CLINICAL DATA:  72 year old male chest tube placement. EXAM: PORTABLE CHEST 1 VIEW COMPARISON:  1851 hours today. FINDINGS: Portable AP upright view at 1959 hours. A small caliber chest tube has been placed and courses to the right lung apex. The right pneumothorax appears virtually resolved with perhaps trace residual evident along the right hemidiaphragm surface. More normal appearing lung volumes. Tortuous thoracic aorta. Other mediastinal contours  are within normal limits. Visualized tracheal air column is within normal limits. No pulmonary edema, pleural effusion or confluent opacity. Extensive chronic postoperative and bony changes about both shoulders. No acute osseous abnormality identified. IMPRESSION: 1. Right chest tube placed with essentially resolved right pneumothorax; possible trace residual along the right hemidiaphragm. 2. No other acute cardiopulmonary abnormality. Electronically Signed   By: Genevie Ann M.D.   On: 08/11/2017 20:40   Dg Chest Portable 1 View  Result Date: 08/11/2017 CLINICAL DATA:  Chest pain, shortness of breath. EXAM: PORTABLE CHEST 1 VIEW COMPARISON:  CT scan of July 01, 2016. Radiographs of September 26, 2013. FINDINGS: The heart size and mediastinal contours are within normal limits. Left lung is clear. Large right pneumothorax is noted. Status post bilateral shoulder arthroplasties. IMPRESSION: Large right pneumothorax is noted. Critical Value/emergent results were called by telephone at the time of interpretation on 08/11/2017 at 7:38 pm to Dr. Nance Pear , who verbally acknowledged these results. Electronically Signed   By: Marijo Conception, M.D.   On: 08/11/2017 19:38     IMPRESSION AND PLAN:   *Right spontaneous pneumothorax Patient does have some bullous areas reviewing old CT scan of the chest.  Does have smoking history but no diagnosis of COPD. Status post chest tube placement in the emergency room with resolution of the pneumothorax.  Patient will be admitted to the hospital.  Repeat chest x-ray in the morning.  Will consult thoracic surgery.  Pain medications added.  Nebulizers.  Incentive spirometer.  *Acute hypoxic respiratory failure secondary to right pneumothorax.  Wean oxygen as tolerated.  Nebulizers.  *Hypertension.  Continue home medications.  IV hydralazine.  *CKD stage III stable  DVT prophylaxis with Lovenox  All the records are reviewed and case discussed with ED provider. Management  plans discussed with the patient, family and they are in agreement.  CODE STATUS: FULL CODE  TOTAL TIME TAKING CARE OF THIS PATIENT: 40 minutes.   Neita Carp M.D on 08/11/2017 at 9:12 PM  Between 7am to 6pm - Pager - [phone]  After 6pm go to [email or web address] - password [email or web address] [facility]  Office  [phone]  CC: Primary care physician; Idelle Crouch, MD  Note: This dictation was prepared with Dragon dictation along with smaller phrase technology. Any transcriptional errors that result from this process are unintentional.

## 2017-08-11 NOTE — ED Provider Notes (Signed)
[facility] Emergency Department Provider Note   ____________________________________________   I have reviewed the triage vital signs and the nursing notes.   HISTORY  Chief Complaint Shortness of Breath   History limited by: Not Limited   HPI Gavin Maxwell is a 72 y.o. male who presents to the emergency department today because of concern for chest pain and shortness of breath. The symptoms came on suddenly. He was bending over when he felt discomfort on his right side. Became acutely short of breath.  Patient states that he did spend most of his afternoon mowing the lawn.  States however it is not unusual for him to do this activity.  He denies any trauma today.  Has noticed since the pain or shortness of breath started that it is worse with movement.  He denies similar symptoms in the past.  No history of known lung disease.   Per medical record review patient has a history of non hodgkin's lymphoma  Past Medical History:  Diagnosis Date  . Anxiety   . Cancer ([place])    non hodgins  lymphoma  . Depression   . GERD (gastroesophageal reflux disease)   . History of hiatal hernia   . Hypertension   . Primary localized osteoarthritis of knee 09/26/2014  . Primary localized osteoarthrosis, shoulder region, rotator cuff arthropathy 10/04/2013  . Sleep apnea    cpap    Patient Active Problem List   Diagnosis Date Noted  . Primary localized osteoarthritis of knee 09/26/2014  . Knee osteoarthritis 09/26/2014  . Primary localized osteoarthrosis, shoulder region, rotator cuff arthropathy 10/04/2013    Past Surgical History:  Procedure Laterality Date  . CARDIAC CATHETERIZATION     20 yrs. ago  . CHOLECYSTECTOMY    . EYE SURGERY    . JOINT REPLACEMENT     hip  . NASAL SINUS SURGERY     x2  . REVERSE SHOULDER ARTHROPLASTY Right 10/04/2013   Procedure: REVERSE SHOULDER ARTHROPLASTY;  Surgeon: Johnny Bridge, MD;  Location: [place];  Service:  Orthopedics;  Laterality: Right;  . SHOULDER ACROMIOPLASTY     x 5 shoulder surgeries  . TOTAL KNEE ARTHROPLASTY Right 09/26/2014  . TOTAL KNEE ARTHROPLASTY Right 09/26/2014   Procedure: RIGHT TOTAL KNEE ARTHROPLASTY;  Surgeon: Marchia Bond, MD;  Location: [place];  Service: Orthopedics;  Laterality: Right;    Prior to Admission medications   Medication Sig Start Date End Date Taking? Authorizing Provider  aspirin EC 81 MG tablet Take 81 mg by mouth every morning.    [provider]  atorvastatin (LIPITOR) 80 MG tablet Take 80 mg by mouth daily with supper.    [provider]  baclofen (LIORESAL) 10 MG tablet Take 1 tablet (10 mg total) by mouth 3 (three) times daily. As needed for muscle spasm 09/26/14   Marchia Bond, MD  Calcium Carbonate (CALCIUM 600 PO) Take 600 mg by mouth daily.    [provider]  fenofibrate 160 MG tablet Take 160 mg by mouth every morning.    [provider]  ferrous sulfate (IRON SUPPLEMENT) 325 (65 FE) MG tablet Take 325 mg by mouth daily.    [provider]  fluticasone (FLONASE) 50 MCG/ACT nasal spray Place 2 sprays into both nostrils daily.     [provider]  gabapentin (NEURONTIN) 300 MG capsule Take 300 mg by mouth 2 (two) times daily.    [provider]  HYDROcodone-acetaminophen (NORCO) 10-325 MG per tablet Take 1-2  tablets by mouth every 6 (six) hours as needed. 09/26/14   Marchia Bond, MD  metoprolol succinate (TOPROL-XL) 25 MG 24 hr tablet Take 25 mg by mouth every evening.     [provider]  ondansetron (ZOFRAN) 4 MG tablet Take 1 tablet (4 mg total) by mouth every 8 (eight) hours as needed for nausea or vomiting. 09/26/14   Marchia Bond, MD  pantoprazole (PROTONIX) 40 MG tablet Take 40 mg by mouth daily.     [provider]  sennosides-docusate sodium (SENOKOT-S) 8.6-50 MG tablet Take 2 tablets by mouth daily. 09/26/14   Marchia Bond, MD  sertraline (ZOLOFT) 50 MG  tablet Take 50 mg by mouth at bedtime.    [provider]    Allergies Augmentin [amoxicillin-pot clavulanate] and Celebrex [celecoxib]  History reviewed. No pertinent family history.  Social History Social History   Tobacco Use  . Smoking status: Former Smoker    Packs/day: 2.00    Years: 20.00    Pack years: 40.00    Types: Cigarettes  . Smokeless tobacco: Never Used  Substance Use Topics  . Alcohol use: No  . Drug use: No    Review of Systems Constitutional: No fever/chills Eyes: No visual changes. ENT: No sore throat. Cardiovascular: Positive for chest pain. Respiratory: Positive for shortness of breath. Gastrointestinal: No abdominal pain.  No nausea, no vomiting.  No diarrhea.   Genitourinary: Negative for dysuria. Musculoskeletal: Negative for back pain. Skin: Negative for rash. Neurological: Negative for headaches, focal weakness or numbness.  ____________________________________________   PHYSICAL EXAM:  VITAL SIGNS: ED Triage Vitals [08/11/17 1859]  Enc Vitals Group     BP 131/87     Pulse Rate 92     Resp (!) 23     Temp 98.4 F (36.9 C)     Temp Source Oral     SpO2 97 %     Weight 236 lb 4.8 oz (107.2 kg)     Height 5\' 11"  (1.803 m)     Head Circumference      Peak Flow      Pain Score 2   Constitutional: Alert and oriented. Appears uncomfortable. Eyes: Conjunctivae are normal.  ENT   Head: Normocephalic and atraumatic.   Nose: No congestion/rhinnorhea.   Mouth/Throat: Mucous membranes are moist.   Neck: No stridor. Cardiovascular: Normal rate, regular rhythm.  No murmurs, rubs, or gallops. Respiratory: Slightly increased work of breathing. On non rebreather at the time of my exam. Diminished breath sounds to the right lung. No wheezing appreciated. Gastrointestinal: Soft and non tender. No rebound. No guarding.  Genitourinary: Deferred Musculoskeletal: Normal range of motion in all extremities.  Neurologic:  Normal  speech and language. No gross focal neurologic deficits are appreciated.  Skin:  Skin is warm, dry and intact. No rash noted. Psychiatric: Mood and affect are normal. Speech and behavior are normal. Patient exhibits appropriate insight and judgment.  ____________________________________________    LABS (pertinent positives/negatives)  BMP na 137, k 4.1, glu 103, cr 1.61 CBC wbc 5.3, hgb 13.4, plt 224 Trop <0.03  ____________________________________________   EKG  None  ____________________________________________    RADIOLOGY  CXR Large right pneumothorax  CXR #2  Good resolution of pneumothorax s/p chest tube placement  I, Jermario Kalmar, personally viewed and evaluated these images (plain radiographs) as part of my medical decision making. ____________________________________________   PROCEDURES  Procedures  CHEST TUBE INSERTION Date/Time: 08/11/2017 at 8:08 PM Performed by: Nance Pear Consent: The procedure was  performed in an emergent situation. Imaging studies: imaging studies available Required items: required blood products, implants, devices, and special equipment available Patient identity confirmed  Indications: pneumothorax  Patient sedated: no Anesthesia: no Preparation: skin prepped with chloroprep  Placement location: right mid axillary line  Technique: Trochanter  Tube connected to: water seal  Suture material: silk Dressing: petroleum dressing  Post-insertion x-ray findings: almost complete resolution of pneumothorax Patient tolerance: Patient tolerated the procedure well with no immediate complications  ____________________________________________   INITIAL IMPRESSION / ASSESSMENT AND PLAN / ED COURSE  Pertinent labs & imaging results that were available during my care of the patient were reviewed by me and considered in my medical decision making (see chart for details).  Patient presented to the emergency department  today because of concern for right sided chest pain and shortness of breath.  This did occur suddenly.  Chest x-ray showed large right-sided pneumothorax.  Did discuss risk and benefits of chest tube placement.  Chest tube was placed using trochanter.  A good rush of air was heard and post chest tube placement x-rays showed almost complete resolution of the pneumothorax.  Patient did have some postprocedure pain in that area.  Patient will be admitted to the hospital.   ____________________________________________   FINAL CLINICAL IMPRESSION(S) / ED DIAGNOSES  Final diagnoses:  Shortness of breath  Chest pain, unspecified type  Pneumothorax, unspecified type     Note: This dictation was prepared with Dragon dictation. Any transcriptional errors that result from this process are unintentional     Nance Pear, MD 08/12/17 [phone]

## 2017-08-11 NOTE — ED Triage Notes (Signed)
Pt to ED via EMS after sudden onset of SOB and 2/10 right sided chest pain this afternoon. pT was reportedly working in the yard and after having stopped the pain and SOB started. Pt is on RA at home and arrived to ED on 15L NRB and 2L [place] that kept pt at 95%. Pt has diminished lung sounds on the right side with audible wheezing upon arrival. Pt reports the wheezing and a cough have been present for the past three days but also reports having allergies this time of year. No heart hx or lung hx reported. Pt able to speak in complete sentences but after taking NRB off and working to move from EMS stretcher to ED bed pts oxygen saturation dropped to 82% with noted increased in SOB and WOB.

## 2017-08-11 NOTE — ED Notes (Signed)
Shannon RN, aware of bed assigned  

## 2017-08-11 NOTE — ED Notes (Signed)
Pt reports he can not take oxycodone. RN paged admitting MD and updated the floor RN.

## 2017-08-12 ENCOUNTER — Inpatient Hospital Stay: Payer: PPO

## 2017-08-12 DIAGNOSIS — J930 Spontaneous tension pneumothorax: Secondary | ICD-10-CM

## 2017-08-12 LAB — CBC
HCT: 37.6 % — ABNORMAL LOW (ref 40.0–52.0)
Hemoglobin: 12.9 g/dL — ABNORMAL LOW (ref 13.0–18.0)
MCH: 31.9 pg (ref 26.0–34.0)
MCHC: 34.3 g/dL (ref 32.0–36.0)
MCV: 92.9 fL (ref 80.0–100.0)
PLATELETS: 192 10*3/uL (ref 150–440)
RBC: 4.05 MIL/uL — ABNORMAL LOW (ref 4.40–5.90)
RDW: 13.3 % (ref 11.5–14.5)
WBC: 5.6 10*3/uL (ref 3.8–10.6)

## 2017-08-12 LAB — BASIC METABOLIC PANEL
ANION GAP: 10 (ref 5–15)
BUN: 27 mg/dL — ABNORMAL HIGH (ref 6–20)
CALCIUM: 9.1 mg/dL (ref 8.9–10.3)
CHLORIDE: 105 mmol/L (ref 101–111)
CO2: 23 mmol/L (ref 22–32)
CREATININE: 1.48 mg/dL — AB (ref 0.61–1.24)
GFR calc Af Amer: 52 mL/min — ABNORMAL LOW (ref >60)
GFR calc non Af Amer: 45 mL/min — ABNORMAL LOW (ref >60)
GLUCOSE: 96 mg/dL (ref 65–99)
POTASSIUM: 4 mmol/L (ref 3.5–5.1)
Sodium: 138 mmol/L (ref 135–145)

## 2017-08-12 MED ORDER — SODIUM CHLORIDE 0.9 % IV SOLN
INTRAVENOUS | Status: AC | PRN
  Administered 2017-08-12: 10 mL/h via INTRAVENOUS

## 2017-08-12 MED ORDER — MIDAZOLAM HCL 5 MG/5ML IJ SOLN
INTRAMUSCULAR | Status: AC
  Filled 2017-08-12: qty 5

## 2017-08-12 MED ORDER — FENTANYL CITRATE (PF) 100 MCG/2ML IJ SOLN
INTRAMUSCULAR | Status: AC
  Filled 2017-08-12: qty 4

## 2017-08-12 MED ORDER — LIDOCAINE HCL (PF) 1 % IJ SOLN
INTRAMUSCULAR | Status: AC | PRN
  Administered 2017-08-12: 10 mL

## 2017-08-12 MED ORDER — MIDAZOLAM HCL 5 MG/5ML IJ SOLN
INTRAMUSCULAR | Status: AC | PRN
  Administered 2017-08-12: 1 mg via INTRAVENOUS

## 2017-08-12 MED ORDER — ENOXAPARIN SODIUM 40 MG/0.4ML ~~LOC~~ SOLN
40.0000 mg | SUBCUTANEOUS | Status: DC
  Administered 2017-08-13 – 2017-08-17 (×5): 40 mg via SUBCUTANEOUS
  Filled 2017-08-12 (×5): qty 0.4

## 2017-08-12 MED ORDER — FENTANYL CITRATE (PF) 100 MCG/2ML IJ SOLN
INTRAMUSCULAR | Status: AC | PRN
  Administered 2017-08-12: 50 ug via INTRAVENOUS

## 2017-08-12 NOTE — Progress Notes (Signed)
Received report from radiology that chest tube has come out. Chest tube remains sutured in place. Pt continues to c/o mild shortness of breath. O2 sat 99% on 2 L. RR 22. Dr.Patel notified and in to see pt.

## 2017-08-12 NOTE — Consult Note (Signed)
Chief Complaint: Recurrent PTX  Referring Physician(s): Sudini  Patient Status: [facility] - In-pt  History of Present Illness: Gavin Maxwell is a 72 y.o. male with past medical history significant for non-Hodgkin's lymphoma, hiatal hernia and hypertension who presented to the emergency department yesterday with shortness of breath found to have a large right-sided pneumothorax.  As such, patient underwent bedside placement of a chest tube by the emergency department with resolution of pneumothorax.  Unfortunately, during the evening, the patient's chest tube had been inadvertantly retracted from the right pleural space with recurrent development of a large right-sided mildly symptomatic pneumothorax.  As such, request has been made for image guided placement of a right-sided chest tube.  Patient admits to recurrent mild to moderate shortness of breath.  Is otherwise without complaint.  No chest pain or fever.  Patient only had several sips of water this morning.  Last dose of prophylactic Lovenox was last evening.  Past Medical History:  Diagnosis Date  . Anxiety   . Cancer ([place])    non hodgins  lymphoma  . Depression   . GERD (gastroesophageal reflux disease)   . History of hiatal hernia   . Hypertension   . Primary localized osteoarthritis of knee 09/26/2014  . Primary localized osteoarthrosis, shoulder region, rotator cuff arthropathy 10/04/2013  . Sleep apnea    cpap    Past Surgical History:  Procedure Laterality Date  . CARDIAC CATHETERIZATION     20 yrs. ago  . CHOLECYSTECTOMY    . EYE SURGERY    . JOINT REPLACEMENT     hip  . NASAL SINUS SURGERY     x2  . REVERSE SHOULDER ARTHROPLASTY Right 10/04/2013   Procedure: REVERSE SHOULDER ARTHROPLASTY;  Surgeon: Johnny Bridge, MD;  Location: [facility];  Service: Orthopedics;  Laterality: Right;  . SHOULDER ACROMIOPLASTY     x 5 shoulder surgeries  . TOTAL KNEE ARTHROPLASTY Right 09/26/2014  . TOTAL KNEE ARTHROPLASTY Right  09/26/2014   Procedure: RIGHT TOTAL KNEE ARTHROPLASTY;  Surgeon: Marchia Bond, MD;  Location: [facility];  Service: Orthopedics;  Laterality: Right;    Allergies: Augmentin [amoxicillin-pot clavulanate] and Celebrex [celecoxib]  Medications: Prior to Admission medications   Medication Sig Start Date End Date Taking? Authorizing Provider  aspirin EC 81 MG tablet Take 81 mg by mouth every morning.   Yes [provider]  atorvastatin (LIPITOR) 80 MG tablet Take 80 mg by mouth daily with supper.   Yes [provider]  Calcium Carbonate (CALCIUM 600 PO) Take 600 mg by mouth daily.   Yes [provider]  fenofibrate 160 MG tablet Take 160 mg by mouth every morning.   Yes [provider]  ferrous sulfate (IRON SUPPLEMENT) 325 (65 FE) MG tablet Take 325 mg by mouth daily.   Yes [provider]  fluticasone (FLONASE) 50 MCG/ACT nasal spray Place 2 sprays into both nostrils daily.    Yes [provider]  gabapentin (NEURONTIN) 300 MG capsule Take 300 mg by mouth 2 (two) times daily.   Yes [provider]  metoprolol succinate (TOPROL-XL) 25 MG 24 hr tablet Take 25 mg by mouth every evening.    Yes [provider]  pantoprazole (PROTONIX) 40 MG tablet Take 40 mg by mouth daily.    Yes [provider]  sertraline (ZOLOFT) 50 MG tablet Take 50 mg by mouth at bedtime.   Yes [provider]  baclofen (LIORESAL) 10 MG tablet Take 1 tablet (10 mg  total) by mouth 3 (three) times daily. As needed for muscle spasm Patient not taking: Reported on 08/11/2017 09/26/14   Marchia Bond, MD  HYDROcodone-acetaminophen [facility]) 10-325 MG per tablet Take 1-2 tablets by mouth every 6 (six) hours as needed. Patient not taking: Reported on 08/11/2017 09/26/14   Marchia Bond, MD  ondansetron (ZOFRAN) 4 MG tablet Take 1 tablet (4 mg total) by mouth every 8 (eight) hours as needed for nausea or vomiting. Patient not taking: Reported on 08/11/2017  09/26/14   Marchia Bond, MD  sennosides-docusate sodium (SENOKOT-S) 8.6-50 MG tablet Take 2 tablets by mouth daily. Patient not taking: Reported on 08/11/2017 09/26/14   Marchia Bond, MD     History reviewed. No pertinent family history.  Social History   Socioeconomic History  . Marital status: Married    Spouse name: Not on file  . Number of children: Not on file  . Years of education: Not on file  . Highest education level: Not on file  Occupational History  . Not on file  Social Needs  . Financial resource strain: Not on file  . Food insecurity:    Worry: Not on file    Inability: Not on file  . Transportation needs:    Medical: Not on file    Non-medical: Not on file  Tobacco Use  . Smoking status: Former Smoker    Packs/day: 2.00    Years: 20.00    Pack years: 40.00    Types: Cigarettes  . Smokeless tobacco: Never Used  Substance and Sexual Activity  . Alcohol use: No  . Drug use: No  . Sexual activity: Not on file  Lifestyle  . Physical activity:    Days per week: Not on file    Minutes per session: Not on file  . Stress: Not on file  Relationships  . Social connections:    Talks on phone: Not on file    Gets together: Not on file    Attends religious service: Not on file    Active member of club or organization: Not on file    Attends meetings of clubs or organizations: Not on file    Relationship status: Not on file  Other Topics Concern  . Not on file  Social History Narrative  . Not on file    ECOG Status: 2 - Symptomatic, <50% confined to bed  Review of Systems: A 12 point ROS discussed and pertinent positives are indicated in the HPI above.  All other systems are negative.  Review of Systems  Constitutional: Positive for activity change. Negative for fever.  Respiratory: Positive for shortness of breath.   Cardiovascular: Negative.   Gastrointestinal: Negative.     Vital Signs: BP 136/79   Pulse 74   Temp 98.3 F (36.8 C)   Resp 14    Ht 5\' 11"  (1.803 m)   Wt 206 lb 5.6 oz (93.6 kg)   SpO2 99%   BMI 28.78 kg/m   Physical Exam  Constitutional: He appears well-developed and well-nourished.  HENT:  Head: Normocephalic and atraumatic.  Cardiovascular: Normal rate and regular rhythm.  Pulmonary/Chest: Tachypnea noted.  Decreased right-sided breath sounds.  Psychiatric: He has a normal mood and affect. His behavior is normal.    Imaging: Dg Chest 2 View  Result Date: 08/12/2017 CLINICAL DATA:  Right pneumothorax. EXAM: CHEST - 2 VIEW COMPARISON:  Radiograph of Aug 11, 2017. FINDINGS: The heart size and mediastinal contours are within normal limits. Left lung is clear. Large  right pneumothorax has recurred. Chest tube appears to have been completely withdrawn from the thoracic space. Status post bilateral arthroplasties. IMPRESSION: Large right pneumothorax has recurred. Chest tube appears to have been completely withdrawn from thoracic space. Critical Value/emergent results were called by telephone at the time of interpretation on 08/12/2017 at 8:46 am to Alanson Puls, the patient's nurse, who verbally acknowledged these results and will immediately contact the physician. Electronically Signed   By: Marijo Conception, M.D.   On: 08/12/2017 08:47   Dg Chest Portable 1 View  Result Date: 08/11/2017 CLINICAL DATA:  72 year old male chest tube placement. EXAM: PORTABLE CHEST 1 VIEW COMPARISON:  1851 hours today. FINDINGS: Portable AP upright view at 1959 hours. A small caliber chest tube has been placed and courses to the right lung apex. The right pneumothorax appears virtually resolved with perhaps trace residual evident along the right hemidiaphragm surface. More normal appearing lung volumes. Tortuous thoracic aorta. Other mediastinal contours are within normal limits. Visualized tracheal air column is within normal limits. No pulmonary edema, pleural effusion or confluent opacity. Extensive chronic postoperative and bony  changes about both shoulders. No acute osseous abnormality identified. IMPRESSION: 1. Right chest tube placed with essentially resolved right pneumothorax; possible trace residual along the right hemidiaphragm. 2. No other acute cardiopulmonary abnormality. Electronically Signed   By: Genevie Ann M.D.   On: 08/11/2017 20:40   Dg Chest Portable 1 View  Result Date: 08/11/2017 CLINICAL DATA:  Chest pain, shortness of breath. EXAM: PORTABLE CHEST 1 VIEW COMPARISON:  CT scan of July 01, 2016. Radiographs of September 26, 2013. FINDINGS: The heart size and mediastinal contours are within normal limits. Left lung is clear. Large right pneumothorax is noted. Status post bilateral shoulder arthroplasties. IMPRESSION: Large right pneumothorax is noted. Critical Value/emergent results were called by telephone at the time of interpretation on 08/11/2017 at 7:38 pm to Dr. Nance Pear , who verbally acknowledged these results. Electronically Signed   By: Marijo Conception, M.D.   On: 08/11/2017 19:38    Labs:  CBC: Recent Labs    08/11/17 1901 08/12/17 0534  WBC 5.3 5.6  HGB 13.4 12.9*  HCT 38.6* 37.6*  PLT 224 192    COAGS: No results for input(s): INR, APTT in the last 8760 hours.  BMP: Recent Labs    08/11/17 1901 08/12/17 0534  NA 137 138  K 4.1 4.0  CL 103 105  CO2 25 23  GLUCOSE 103* 96  BUN 31* 27*  CALCIUM 9.5 9.1  CREATININE 1.61* 1.48*  GFRNONAA 40* 45*  GFRAA 47* 52*    LIVER FUNCTION TESTS: No results for input(s): BILITOT, AST, ALT, ALKPHOS, PROT, ALBUMIN in the last 8760 hours.  TUMOR MARKERS: No results for input(s): AFPTM, CEA, CA199, CHROMGRNA in the last 8760 hours.  Assessment and Plan:  JAQUAY POSTHUMUS is a 72 y.o. male with past medical history significant for non-Hodgkin's lymphoma, hiatal hernia and hypertension who presented to the emergency department yesterday with shortness of breath found to have a large right-sided pneumothorax.  As such, patient underwent  bedside placement of a chest tube by the emergency department with resolution of pneumothorax.  Unfortunately, during the evening, the patient's chest tube had been inadvertantly retracted from the right pleural space with recurrent development of a large right-sided mildly symptomatic pneumothorax.  As such, request has been made for image guided placement of a right-sided chest tube.  Risks and benefits of CT guided right chest tube placement  was discussed with the patient including bleeding, infection, damage to adjacent structures, bowel perforation/fistula connection, and sepsis.  All of the patient's questions were answered, patient is agreeable to proceed.  Consent signed and in chart.  Thank you for this interesting consult.  I greatly enjoyed meeting Gavin Maxwell and look forward to participating in their care.  A copy of this report was sent to the requesting provider on this date.  Electronically Signed: Sandi Mariscal, MD 08/12/2017, 9:49 AM   I spent a total of 20 Minutes in face to face in clinical consultation, greater than 50% of which was counseling/coordinating care for CT guided chest tube placement.

## 2017-08-12 NOTE — Progress Notes (Signed)
Pharmacy consulted for standard risk IR procedure restarting of anticoagulation. Previous anticoag: Enoxaparin 40mg  subq q24h    Will restart tomorrow morning at 8am per protocol  Enoxaparin 40mg  subq q24h     Thomasenia Sales, PharmD, MBA, BCGP Clinical Pharmacist

## 2017-08-12 NOTE — Consult Note (Signed)
Patient ID: Gavin Maxwell, male   DOB: 1945-11-24, 72 y.o.   MRN: [id number]  HPI Gavin Maxwell is a 72 y.o. male asked to see in consultation by Dr. Posey Pronto. Our service has d/w him the case in detail.  I presented with shortness of breath and right-sided chest pain while she he was in the shower.  He reports the pain was moderate constant and sharp in nature.  Pain exacerbated when taking a deep breath.  Currently he was the ER that day.  In the emergency room he needed oxygen and was hypoxic.  Chest x-ray showed a significant right pneumothorax and the ER doctor placed a thoracostomy tube.  Symptoms significantly improved.  Today at some point in time apparently the chest tube was dislodged and the patient reaccumulated the pneumothorax.  Interventional radiology was kind enough to put CT-guided chest tube again with very good results.  Currently the patient denies any chest pain or any shortness of breath. He tells me that he was a heavy smoker more than 25 years ago, apparently reports no dyspnea on exertion or evidence of COPD previously. Multiple chest x-ray as well as CT scan personally reviewed initially show evidence of a right pneumothorax with resolution after the chest tube was placed.  CT scan shows evidence of tiny subpleural nodules.  HPI  Past Medical History:  Diagnosis Date  . Anxiety   . Cancer ([place])    non hodgins  lymphoma  . Depression   . GERD (gastroesophageal reflux disease)   . History of hiatal hernia   . Hypertension   . Primary localized osteoarthritis of knee 09/26/2014  . Primary localized osteoarthrosis, shoulder region, rotator cuff arthropathy 10/04/2013  . Sleep apnea    cpap    Past Surgical History:  Procedure Laterality Date  . CARDIAC CATHETERIZATION     20 yrs. ago  . CHOLECYSTECTOMY    . EYE SURGERY    . JOINT REPLACEMENT     hip  . NASAL SINUS SURGERY     x2  . REVERSE SHOULDER ARTHROPLASTY Right 10/04/2013   Procedure: REVERSE SHOULDER  ARTHROPLASTY;  Surgeon: Johnny Bridge, MD;  Location: [facility];  Service: Orthopedics;  Laterality: Right;  . SHOULDER ACROMIOPLASTY     x 5 shoulder surgeries  . TOTAL KNEE ARTHROPLASTY Right 09/26/2014  . TOTAL KNEE ARTHROPLASTY Right 09/26/2014   Procedure: RIGHT TOTAL KNEE ARTHROPLASTY;  Surgeon: Marchia Bond, MD;  Location: [place];  Service: Orthopedics;  Laterality: Right;    History reviewed. No pertinent family history.  Social History Social History   Tobacco Use  . Smoking status: Former Smoker    Packs/day: 2.00    Years: 20.00    Pack years: 40.00    Types: Cigarettes  . Smokeless tobacco: Never Used  Substance Use Topics  . Alcohol use: No  . Drug use: No    Allergies  Allergen Reactions  . Augmentin [Amoxicillin-Pot Clavulanate] Nausea And Vomiting  . Celebrex [Celecoxib] Nausea And Vomiting    Current Facility-Administered Medications  Medication Dose Route Frequency Provider Last Rate Last Dose  . acetaminophen (TYLENOL) tablet 650 mg  650 mg Oral Q6H PRN Hillary Bow, MD       Or  . acetaminophen (TYLENOL) suppository 650 mg  650 mg Rectal Q6H PRN Sudini, Srikar, MD      . albuterol (PROVENTIL) (2.5 MG/3ML) 0.083% nebulizer solution 2.5 mg  2.5 mg Nebulization Q2H PRN Hillary Bow, MD      .  atorvastatin (LIPITOR) tablet 80 mg  80 mg Oral Q supper Hillary Bow, MD   80 mg at 08/12/17 1703  . [START ON 08/13/2017] enoxaparin (LOVENOX) injection 40 mg  40 mg Subcutaneous Q24H Coffee, Donna Christen, [facility]      . fenofibrate tablet 160 mg  160 mg Oral q morning - 10a Hillary Bow, MD   160 mg at 08/12/17 1110  . fentaNYL (SUBLIMAZE) 100 MCG/2ML injection           . ferrous sulfate tablet 325 mg  325 mg Oral Daily Hillary Bow, MD   325 mg at 08/12/17 1110  . fluticasone (FLONASE) 50 MCG/ACT nasal spray 2 spray  2 spray Each Nare Daily Hillary Bow, MD   2 spray at 08/12/17 1111  . gabapentin (NEURONTIN) capsule 300 mg  300 mg Oral BID Hillary Bow, MD   300  mg at 08/12/17 1109  . hydrALAZINE (APRESOLINE) injection 10 mg  10 mg Intravenous Q6H PRN Sudini, Alveta Heimlich, MD      . HYDROcodone-acetaminophen (NORCO/VICODIN) 5-325 MG per tablet 1 tablet  1 tablet Oral Q6H PRN Hillary Bow, MD   1 tablet at 08/12/17 1200  . metoprolol succinate (TOPROL-XL) 24 hr tablet 25 mg  25 mg Oral QPM Hillary Bow, MD   25 mg at 08/12/17 1703  . midazolam (VERSED) 5 MG/5ML injection           . ondansetron (ZOFRAN) tablet 4 mg  4 mg Oral Q6H PRN Hillary Bow, MD       Or  . ondansetron (ZOFRAN) injection 4 mg  4 mg Intravenous Q6H PRN Sudini, Srikar, MD      . pantoprazole (PROTONIX) EC tablet 40 mg  40 mg Oral Daily Hillary Bow, MD   40 mg at 08/12/17 1110  . polyethylene glycol (MIRALAX / GLYCOLAX) packet 17 g  17 g Oral Daily PRN Sudini, Alveta Heimlich, MD      . senna-docusate (Senokot-S) tablet 2 tablet  2 tablet Oral Daily Hillary Bow, MD   2 tablet at 08/12/17 1109  . sertraline (ZOLOFT) tablet 50 mg  50 mg Oral QHS Hillary Bow, MD   50 mg at 08/11/17 2302     Review of Systems Full ROS  was asked and was negative except for the information on the HPI  Physical Exam Blood pressure 121/63, pulse 67, temperature 98.5 F (36.9 C), temperature source Oral, resp. rate 20, height 5\' 11"  (1.803 m), weight 93.6 kg (206 lb 5.6 oz), SpO2 99 %. CONSTITUTIONAL: NAD, obese  EYES: Pupils are equal, round, and reactive to light, Sclera are non-icteric. EARS, NOSE, MOUTH AND THROAT: The oropharynx is clear. The oral mucosa is pink and moist. Hearing is intact to voice. LYMPH NODES:  Lymph nodes in the neck are normal. RESPIRATORY:  Lungs are clear. There is normal respiratory effort, with equal breath sounds bilaterally, and without pathologic use of accessory muscles. Right thoracostomy tube anterior right chest.  No air leak.Previous old incision on right lateral side covered w dressing. CARDIOVASCULAR: Heart is regular without murmurs, gallops, or rubs. GI: The  abdomen is soft, nontender, and nondistended. There are no palpable masses. There is no hepatosplenomegaly. There are normal bowel sounds in all quadrants. GU: Rectal deferred.   MUSCULOSKELETAL: Normal muscle strength and tone. No cyanosis or edema.   SKIN: Turgor is good and there are no pathologic skin lesions or ulcers. NEUROLOGIC: Motor and sensation is grossly normal. Cranial nerves are grossly intact. PSYCH:  Oriented to person,  place and time. Affect is normal.  Data Reviewed  I have personally reviewed the patient's imaging, laboratory findings and medical records.    Assessment/Plan Spontaneous pneumothorax in a patient with heavy smoker but no concrete evidence of COPD or emphysema.  Currently good response to chest tube.  Will recommend keeping the chest tube and suction for 24-30 Xigris since he had reaccumulation of pneumothorax.  Will transition to waterseal and I will order a chest x-ray for the morning as well as repeat labs.  Discussed with the patient and the family in detail about natural course of the disease obviously if he drops his lung again or if he does not progress or continues to improve he may require pleurodesis or VATS and at that time we may have to transfer him to a different facility because Dr. Faith Rogue our thoracic surgeon is on vacation this week. We will continue to follow.    Caroleen Hamman, MD FACS General Surgeon 08/12/2017, 8:56 PM

## 2017-08-12 NOTE — Procedures (Signed)
Pre procedural Dx: Recurrent PTX Post procedural Dx: Same  Technically successful CT guided placed of a 12 Fr drainage catheter placement into the right pleural space.    EBL: Trace  Complications: None immediate  Ronny Bacon, MD Pager #: [phone]

## 2017-08-12 NOTE — Progress Notes (Signed)
[place] at [facility]                                                                                                                                                                                  Patient Demographics   Gavin Maxwell, is a 72 y.o. male, DOB - 1945-09-17, ZES:[id number]  Admit date - 08/11/2017   Admitting Physician Hillary Bow, MD  Outpatient Primary MD for the patient is Doy Hutching Leonie Douglas, MD   LOS - 1  Subjective: Patient admitted with spontaneous pneumothorax, he had a chest tube placed by the ER physician.  Patient had a follow-up chest x-ray today and was noted to have chest tube that had come out.  I was called urgently by the nurse.  I discussed the case with general surgery who recommended interventional radiology.    Review of Systems:   CONSTITUTIONAL: No documented fever. No fatigue, weakness. No weight gain, no weight loss.  EYES: No blurry or double vision.  ENT: No tinnitus. No postnasal drip. No redness of the oropharynx.  RESPIRATORY: No cough, no wheeze, no hemoptysis. + dyspnea.  CARDIOVASCULAR: No chest pain. No orthopnea. No palpitations. No syncope.  GASTROINTESTINAL: No nausea, no vomiting or diarrhea. No abdominal pain. No melena or hematochezia.  GENITOURINARY: No dysuria or hematuria.  ENDOCRINE: No polyuria or nocturia. No heat or cold intolerance.  HEMATOLOGY: No anemia. No bruising. No bleeding.  INTEGUMENTARY: No rashes. No lesions.  MUSCULOSKELETAL: No arthritis. No swelling. No gout.  NEUROLOGIC: No numbness, tingling, or ataxia. No seizure-type activity.  PSYCHIATRIC: No anxiety. No insomnia. No ADD.    Vitals:   Vitals:   08/12/17 0500 08/12/17 0521 08/12/17 0856 08/12/17 0900  BP:  132/81  136/80  Pulse:  65  65  Resp:  18    Temp:  98.3 F (36.8 C)    TempSrc:      SpO2:  99% 99% 99%  Weight: 93.6 kg (206 lb 5.6 oz)     Height:        Wt Readings from Last 3 Encounters:  08/12/17 93.6  kg (206 lb 5.6 oz)  09/28/14 107.5 kg (236 lb 15.9 oz)  09/14/14 107.9 kg (237 lb 14 oz)     Intake/Output Summary (Last 24 hours) at 08/12/2017 0910 Last data filed at 08/12/2017 0521 Gross per 24 hour  Intake 0 ml  Output 950 ml  Net -950 ml    Physical Exam:   GENERAL: Mild distress HEAD, EYES, EARS, NOSE AND THROAT: Atraumatic, normocephalic. Extraocular muscles are intact. Pupils equal and reactive to light. Sclerae anicteric. No conjunctival injection. No oro-pharyngeal erythema.  NECK: Supple. There is  no jugular venous distention. No bruits, no lymphadenopathy, no thyromegaly.  HEART: Regular rate and rhythm,. No murmurs, no rubs, no clicks.  LUNGS: Decreased breath sounds at the right lung base with accessory muscle usage ABDOMEN: Soft, flat, nontender, nondistended. Has good bowel sounds. No hepatosplenomegaly appreciated.  EXTREMITIES: No evidence of any cyanosis, clubbing, or peripheral edema.  +2 pedal and radial pulses bilaterally.  NEUROLOGIC: The patient is alert, awake, and oriented x3 with no focal motor or sensory deficits appreciated bilaterally.  SKIN: Moist and warm with no rashes appreciated.  Psych: Not anxious, depressed LN: No inguinal LN enlargement    Antibiotics   Anti-infectives (From admission, onward)   None      Medications   Scheduled Meds: . atorvastatin  80 mg Oral Q supper  . fenofibrate  160 mg Oral q morning - 10a  . fentaNYL      . ferrous sulfate  325 mg Oral Daily  . fluticasone  2 spray Each Nare Daily  . gabapentin  300 mg Oral BID  . metoprolol succinate  25 mg Oral QPM  . pantoprazole  40 mg Oral Daily  . senna-docusate  2 tablet Oral Daily  . sertraline  50 mg Oral QHS   Continuous Infusions: PRN Meds:.acetaminophen **OR** acetaminophen, albuterol, hydrALAZINE, HYDROcodone-acetaminophen, ondansetron **OR** ondansetron (ZOFRAN) IV, polyethylene glycol   Data Review:   Micro Results No results found for this or any  previous visit (from the past 240 hour(s)).  Radiology Reports Dg Chest 2 View  Result Date: 08/12/2017 CLINICAL DATA:  Right pneumothorax. EXAM: CHEST - 2 VIEW COMPARISON:  Radiograph of Aug 11, 2017. FINDINGS: The heart size and mediastinal contours are within normal limits. Left lung is clear. Large right pneumothorax has recurred. Chest tube appears to have been completely withdrawn from the thoracic space. Status post bilateral arthroplasties. IMPRESSION: Large right pneumothorax has recurred. Chest tube appears to have been completely withdrawn from thoracic space. Critical Value/emergent results were called by telephone at the time of interpretation on 08/12/2017 at 8:46 am to Alanson Puls, the patient's nurse, who verbally acknowledged these results and will immediately contact the physician. Electronically Signed   By: Marijo Conception, M.D.   On: 08/12/2017 08:47   Dg Chest Portable 1 View  Result Date: 08/11/2017 CLINICAL DATA:  72 year old male chest tube placement. EXAM: PORTABLE CHEST 1 VIEW COMPARISON:  1851 hours today. FINDINGS: Portable AP upright view at 1959 hours. A small caliber chest tube has been placed and courses to the right lung apex. The right pneumothorax appears virtually resolved with perhaps trace residual evident along the right hemidiaphragm surface. More normal appearing lung volumes. Tortuous thoracic aorta. Other mediastinal contours are within normal limits. Visualized tracheal air column is within normal limits. No pulmonary edema, pleural effusion or confluent opacity. Extensive chronic postoperative and bony changes about both shoulders. No acute osseous abnormality identified. IMPRESSION: 1. Right chest tube placed with essentially resolved right pneumothorax; possible trace residual along the right hemidiaphragm. 2. No other acute cardiopulmonary abnormality. Electronically Signed   By: Genevie Ann M.D.   On: 08/11/2017 20:40   Dg Chest Portable 1 View  Result  Date: 08/11/2017 CLINICAL DATA:  Chest pain, shortness of breath. EXAM: PORTABLE CHEST 1 VIEW COMPARISON:  CT scan of July 01, 2016. Radiographs of September 26, 2013. FINDINGS: The heart size and mediastinal contours are within normal limits. Left lung is clear. Large right pneumothorax is noted. Status post bilateral shoulder arthroplasties. IMPRESSION: Large right  pneumothorax is noted. Critical Value/emergent results were called by telephone at the time of interpretation on 08/11/2017 at 7:38 pm to Dr. Nance Pear , who verbally acknowledged these results. Electronically Signed   By: Marijo Conception, M.D.   On: 08/11/2017 19:38     CBC Recent Labs  Lab 08/11/17 1901 08/12/17 0534  WBC 5.3 5.6  HGB 13.4 12.9*  HCT 38.6* 37.6*  PLT 224 192  MCV 92.4 92.9  MCH 32.1 31.9  MCHC 34.7 34.3  RDW 13.7 13.3    Chemistries  Recent Labs  Lab 08/11/17 1901 08/12/17 0534  NA 137 138  K 4.1 4.0  CL 103 105  CO2 25 23  GLUCOSE 103* 96  BUN 31* 27*  CREATININE 1.61* 1.48*  CALCIUM 9.5 9.1   ------------------------------------------------------------------------------------------------------------------ estimated creatinine clearance is 51.2 mL/min (A) (by C-G formula based on SCr of 1.48 mg/dL (H)). ------------------------------------------------------------------------------------------------------------------ No results for input(s): HGBA1C in the last 72 hours. ------------------------------------------------------------------------------------------------------------------ No results for input(s): CHOL, HDL, LDLCALC, TRIG, CHOLHDL, LDLDIRECT in the last 72 hours. ------------------------------------------------------------------------------------------------------------------ No results for input(s): TSH, T4TOTAL, T3FREE, THYROIDAB in the last 72 hours.  Invalid input(s):  FREET3 ------------------------------------------------------------------------------------------------------------------ No results for input(s): VITAMINB12, FOLATE, FERRITIN, TIBC, IRON, RETICCTPCT in the last 72 hours.  Coagulation profile No results for input(s): INR, PROTIME in the last 168 hours.  No results for input(s): DDIMER in the last 72 hours.  Cardiac Enzymes Recent Labs  Lab 08/11/17 1901  TROPONINI <0.03   ------------------------------------------------------------------------------------------------------------------ Invalid input(s): POCBNP    Assessment & Plan  Patient is a 72 year old admitted with right sided pneumothorax chest tube has come out,    *Right spontaneous pneumothorax I have discussed the case with general surgery, as well as interventional radiology Interventional radiology stated that they will be putting the chest tube in Have made patient n.p.o. discontinue the Lovenox  *Acute hypoxic respiratory failure secondary to right pneumothorax.  Wean oxygen as tolerated.  Nebulizers.  *Hypertension.  Continue home medications.  IV hydralazine.  *CKD stage III stable         Code Status Orders  (From admission, onward)        Start     Ordered   08/11/17 2110  Full code  Continuous     08/11/17 2110    Code Status History    Date Active Date Inactive Code Status Order ID Comments User Context   09/26/2014 1157 09/28/2014 1524 Full Code [id number]  Marchia Bond, MD Inpatient   10/04/2013 1520 10/05/2013 1650 Full Code [id number]  Johnny Bridge, MD Inpatient    Advance Directive Documentation     Most Recent Value  Type of Advance Directive  Healthcare Power of Attorney  Pre-existing out of facility DNR order (yellow form or pink MOST form)  -  "MOST" Form in Place?  -           Consults General surgery interventional radiology  DVT Prophylaxis SCDs for now until procedure done  Lab Results  Component Value Date    PLT 192 08/12/2017     Time Spent in minutes 45 minutes of critical care time spent greater than 50% of time spent in care coordination and counseling patient regarding the condition and plan of care.   Dustin Flock M.D on 08/12/2017 at 9:10 AM  Between 7am to 6pm - Pager - [phone]  After 6pm go to [email or web address] - Proofreader  [employer]   Office  [phone]

## 2017-08-13 ENCOUNTER — Inpatient Hospital Stay: Payer: PPO

## 2017-08-13 LAB — BASIC METABOLIC PANEL
ANION GAP: 6 (ref 5–15)
BUN: 27 mg/dL — AB (ref 6–20)
CO2: 28 mmol/L (ref 22–32)
Calcium: 9.1 mg/dL (ref 8.9–10.3)
Chloride: 103 mmol/L (ref 101–111)
Creatinine, Ser: 1.43 mg/dL — ABNORMAL HIGH (ref 0.61–1.24)
GFR calc Af Amer: 54 mL/min — ABNORMAL LOW (ref >60)
GFR, EST NON AFRICAN AMERICAN: 47 mL/min — AB (ref >60)
GLUCOSE: 108 mg/dL — AB (ref 65–99)
POTASSIUM: 4.1 mmol/L (ref 3.5–5.1)
Sodium: 137 mmol/L (ref 135–145)

## 2017-08-13 NOTE — Progress Notes (Signed)
[place] at [facility]                                                                                                                                                                                  Patient Demographics   Gavin Maxwell, is a 72 y.o. male, DOB - 07/10/1945, TWS:[id number]  Admit date - 08/11/2017   Admitting Physician Hillary Bow, MD  Outpatient Primary MD for the patient is Sparks, Leonie Douglas, MD   LOS - 2  Subjective: Patient feeling better    Review of Systems:   CONSTITUTIONAL: No documented fever. No fatigue, weakness. No weight gain, no weight loss.  EYES: No blurry or double vision.  ENT: No tinnitus. No postnasal drip. No redness of the oropharynx.  RESPIRATORY: No cough, no wheeze, no hemoptysis. + dyspnea.  CARDIOVASCULAR: No chest pain. No orthopnea. No palpitations. No syncope.  GASTROINTESTINAL: No nausea, no vomiting or diarrhea. No abdominal pain. No melena or hematochezia.  GENITOURINARY: No dysuria or hematuria.  ENDOCRINE: No polyuria or nocturia. No heat or cold intolerance.  HEMATOLOGY: No anemia. No bruising. No bleeding.  INTEGUMENTARY: No rashes. No lesions.  MUSCULOSKELETAL: No arthritis. No swelling. No gout.  NEUROLOGIC: No numbness, tingling, or ataxia. No seizure-type activity.  PSYCHIATRIC: No anxiety. No insomnia. No ADD.    Vitals:   Vitals:   08/12/17 1703 08/12/17 2019 08/13/17 0437 08/13/17 0500  BP: 140/78 121/63 (!) 144/81   Pulse: 68 67 63   Resp:  20 20   Temp:  98.5 F (36.9 C) 97.9 F (36.6 C)   TempSrc:  Oral Oral   SpO2:  99% 96%   Weight:    99 kg (218 lb 4.1 oz)  Height:        Wt Readings from Last 3 Encounters:  08/13/17 99 kg (218 lb 4.1 oz)  09/28/14 107.5 kg (236 lb 15.9 oz)  09/14/14 107.9 kg (237 lb 14 oz)     Intake/Output Summary (Last 24 hours) at 08/13/2017 1455 Last data filed at 08/13/2017 1040 Gross per 24 hour  Intake 720 ml  Output 1555 ml  Net -835 ml     Physical Exam:   GENERAL: Mild distress HEAD, EYES, EARS, NOSE AND THROAT: Atraumatic, normocephalic. Extraocular muscles are intact. Pupils equal and reactive to light. Sclerae anicteric. No conjunctival injection. No oro-pharyngeal erythema.  NECK: Supple. There is no jugular venous distention. No bruits, no lymphadenopathy, no thyromegaly.  HEART: Regular rate and rhythm,. No murmurs, no rubs, no clicks.  LUNGS: Decreased breath sounds at the right lung base with accessory muscle usage ABDOMEN: Soft, flat, nontender, nondistended. Has good bowel sounds. No hepatosplenomegaly  appreciated.  EXTREMITIES: No evidence of any cyanosis, clubbing, or peripheral edema.  +2 pedal and radial pulses bilaterally.  NEUROLOGIC: The patient is alert, awake, and oriented x3 with no focal motor or sensory deficits appreciated bilaterally.  SKIN: Moist and warm with no rashes appreciated.  Psych: Not anxious, depressed LN: No inguinal LN enlargement    Antibiotics   Anti-infectives (From admission, onward)   None      Medications   Scheduled Meds: . atorvastatin  80 mg Oral Q supper  . enoxaparin (LOVENOX) injection  40 mg Subcutaneous Q24H  . fenofibrate  160 mg Oral q morning - 10a  . ferrous sulfate  325 mg Oral Daily  . fluticasone  2 spray Each Nare Daily  . gabapentin  300 mg Oral BID  . metoprolol succinate  25 mg Oral QPM  . pantoprazole  40 mg Oral Daily  . senna-docusate  2 tablet Oral Daily  . sertraline  50 mg Oral QHS   Continuous Infusions: PRN Meds:.acetaminophen **OR** acetaminophen, albuterol, hydrALAZINE, HYDROcodone-acetaminophen, ondansetron **OR** ondansetron (ZOFRAN) IV, polyethylene glycol   Data Review:   Micro Results No results found for this or any previous visit (from the past 240 hour(s)).  Radiology Reports Dg Chest 1 View  Result Date: 08/13/2017 CLINICAL DATA:  Status post right chest tube placement for a pneumothorax. EXAM: CHEST  1 VIEW  COMPARISON:  PA and lateral chest 08/12/2017. FINDINGS: New pigtail catheter is in place in the right chest. The right lung is re-expanded. No pneumothorax is identified. Lungs are clear. Heart size is enlarged. No pleural effusion. IMPRESSION: Resolved right pneumothorax after chest tube placement. No new abnormality. Electronically Signed   By: Inge Rise M.D.   On: 08/13/2017 10:19   Dg Chest 2 View  Result Date: 08/12/2017 CLINICAL DATA:  Right pneumothorax. EXAM: CHEST - 2 VIEW COMPARISON:  Radiograph of Aug 11, 2017. FINDINGS: The heart size and mediastinal contours are within normal limits. Left lung is clear. Large right pneumothorax has recurred. Chest tube appears to have been completely withdrawn from the thoracic space. Status post bilateral arthroplasties. IMPRESSION: Large right pneumothorax has recurred. Chest tube appears to have been completely withdrawn from thoracic space. Critical Value/emergent results were called by telephone at the time of interpretation on 08/12/2017 at 8:46 am to Alanson Puls, the patient's nurse, who verbally acknowledged these results and will immediately contact the physician. Electronically Signed   By: Marijo Conception, M.D.   On: 08/12/2017 08:47   Dg Chest Portable 1 View  Result Date: 08/11/2017 CLINICAL DATA:  72 year old male chest tube placement. EXAM: PORTABLE CHEST 1 VIEW COMPARISON:  1851 hours today. FINDINGS: Portable AP upright view at 1959 hours. A small caliber chest tube has been placed and courses to the right lung apex. The right pneumothorax appears virtually resolved with perhaps trace residual evident along the right hemidiaphragm surface. More normal appearing lung volumes. Tortuous thoracic aorta. Other mediastinal contours are within normal limits. Visualized tracheal air column is within normal limits. No pulmonary edema, pleural effusion or confluent opacity. Extensive chronic postoperative and bony changes about both shoulders. No  acute osseous abnormality identified. IMPRESSION: 1. Right chest tube placed with essentially resolved right pneumothorax; possible trace residual along the right hemidiaphragm. 2. No other acute cardiopulmonary abnormality. Electronically Signed   By: Genevie Ann M.D.   On: 08/11/2017 20:40   Dg Chest Portable 1 View  Result Date: 08/11/2017 CLINICAL DATA:  Chest pain, shortness of breath. EXAM:  PORTABLE CHEST 1 VIEW COMPARISON:  CT scan of July 01, 2016. Radiographs of September 26, 2013. FINDINGS: The heart size and mediastinal contours are within normal limits. Left lung is clear. Large right pneumothorax is noted. Status post bilateral shoulder arthroplasties. IMPRESSION: Large right pneumothorax is noted. Critical Value/emergent results were called by telephone at the time of interpretation on 08/11/2017 at 7:38 pm to Dr. Nance Pear , who verbally acknowledged these results. Electronically Signed   By: Marijo Conception, M.D.   On: 08/11/2017 19:38   Ct Image Guided Drainage By Percutaneous Catheter  Result Date: 08/12/2017 INDICATION: Recurrent symptomatic pneumothorax. EXAM: CT IMAGE GUIDED DRAINAGE BY PERCUTANEOUS CATHETER COMPARISON:  Chest radiograph - earlier same day; 08/11/2017 MEDICATIONS: The patient is currently admitted to the hospital and receiving intravenous antibiotics. The antibiotics were administered within an appropriate time frame prior to the initiation of the procedure. ANESTHESIA/SEDATION: Moderate (conscious) sedation was employed during this procedure. A total of Versed 1 mg and Fentanyl 50 mcg was administered intravenously. Moderate Sedation Time: 23 minutes. The patient's level of consciousness and vital signs were monitored continuously by radiology nursing throughout the procedure under my direct supervision. CONTRAST:  None COMPLICATIONS: None immediate. PROCEDURE: Informed written consent was obtained from the patient after a discussion of the risks, benefits and alternatives to  treatment. The patient was placed supine on the CT gantry and a pre procedural CT was performed re-demonstrating the known large right-sided pneumothorax. The procedure was planned. A timeout was performed prior to the initiation of the procedure. The skin overlying the anterior superior with 1% lidocaine with epinephrine. Appropriate trajectory was planned with the use of a 22 gauge spinal needle. An 18 gauge trocar needle was advanced into the right pleural space and a short Amplatz super stiff wire was coiled within the right pleural space. Appropriate positioning was confirmed with a limited CT scan. The tract was serially dilated allowing placement of a 12 [place] all-purpose drainage catheter. Appropriate positioning was confirmed with a limited postprocedural CT scan. The chest tube was connected to a pleural vac device and wall suction. Chest tube was sutured in place and secured with a Stat Lock device. Dressings were applied. Completion images were obtained. The patient tolerated the procedure well without immediate post procedural complication. IMPRESSION: Successful CT guided placement of a 32 French all purpose drain catheter into the right pleural space with near complete resolution of large right-sided pneumothorax. Electronically Signed   By: Sandi Mariscal M.D.   On: 08/12/2017 11:53     CBC Recent Labs  Lab 08/11/17 1901 08/12/17 0534  WBC 5.3 5.6  HGB 13.4 12.9*  HCT 38.6* 37.6*  PLT 224 192  MCV 92.4 92.9  MCH 32.1 31.9  MCHC 34.7 34.3  RDW 13.7 13.3    Chemistries  Recent Labs  Lab 08/11/17 1901 08/12/17 0534 08/13/17 0416  NA 137 138 137  K 4.1 4.0 4.1  CL 103 105 103  CO2 25 23 28   GLUCOSE 103* 96 108*  BUN 31* 27* 27*  CREATININE 1.61* 1.48* 1.43*  CALCIUM 9.5 9.1 9.1   ------------------------------------------------------------------------------------------------------------------ estimated creatinine clearance is 54.4 mL/min (A) (by C-G formula based on SCr of  1.43 mg/dL (H)). ------------------------------------------------------------------------------------------------------------------ No results for input(s): HGBA1C in the last 72 hours. ------------------------------------------------------------------------------------------------------------------ No results for input(s): CHOL, HDL, LDLCALC, TRIG, CHOLHDL, LDLDIRECT in the last 72 hours. ------------------------------------------------------------------------------------------------------------------ No results for input(s): TSH, T4TOTAL, T3FREE, THYROIDAB in the last 72 hours.  Invalid input(s): FREET3 ------------------------------------------------------------------------------------------------------------------ No  results for input(s): VITAMINB12, FOLATE, FERRITIN, TIBC, IRON, RETICCTPCT in the last 72 hours.  Coagulation profile No results for input(s): INR, PROTIME in the last 168 hours.  No results for input(s): DDIMER in the last 72 hours.  Cardiac Enzymes Recent Labs  Lab 08/11/17 1901  TROPONINI <0.03   ------------------------------------------------------------------------------------------------------------------ Invalid input(s): POCBNP    Assessment & Plan  Patient is a 72 year old admitted with right sided pneumothorax chest tube has come out,    *Right spontaneous pneumothorax Status post new chest tube placement by IR surgery following  *Acute hypoxic respiratory failure secondary to right pneumothorax.  Wean oxygen as tolerated.  Nebulizers.  *Hypertension.  Continue home medications.  IV hydralazine.  *CKD stage III stable  *DVT prophylaxis resume Lovenox       Code Status Orders  (From admission, onward)        Start     Ordered   08/11/17 2110  Full code  Continuous     08/11/17 2110    Code Status History    Date Active Date Inactive Code Status Order ID Comments User Context   09/26/2014 1157 09/28/2014 1524 Full Code [id number]   Marchia Bond, MD Inpatient   10/04/2013 1520 10/05/2013 1650 Full Code [id number]  Johnny Bridge, MD Inpatient    Advance Directive Documentation     Most Recent Value  Type of Advance Directive  Healthcare Power of Attorney  Pre-existing out of facility DNR order (yellow form or pink MOST form)  -  "MOST" Form in Place?  -           Consults General surgery interventional radiology  DVT Prophylaxis SCDs for now until procedure done  Lab Results  Component Value Date   PLT 192 08/12/2017     Time Spent in minutes 35 minutes spent  Dustin Flock M.D on 08/13/2017 at 2:55 PM  Between 7am to 6pm - Pager - [phone]  After 6pm go to [email or web address] - Proofreader  [employer]   Office  [phone]

## 2017-08-13 NOTE — Progress Notes (Signed)
08/13/2017  Subjective: No acute events overnight.  New chest tube placed yesterday by IR.  CXR this morning shows resolved pneumothorax.  However, the patient has an air leak.  Vital signs: Temp:  [97.9 F (36.6 C)-98.5 F (36.9 C)] 97.9 F (36.6 C) (05/09 0437) Pulse Rate:  [63-68] 63 (05/09 0437) Resp:  [20] 20 (05/09 0437) BP: (121-144)/(63-81) 144/81 (05/09 0437) SpO2:  [96 %-99 %] 96 % (05/09 0437) Weight:  [218 lb 4.1 oz (99 kg)] 218 lb 4.1 oz (99 kg) (05/09 0500)   Intake/Output: 05/08 0701 - 05/09 0700 In: 960 [P.O.:960] Out: 2030 [Urine:2025; Chest Tube:5] Last BM Date: 08/13/17  Physical Exam: Constitutional: No acute distress Pulm:  Lungs clear bilaterally.  Chest tube in place anteriorly.  Air leak in system with breathing.    Labs:  Recent Labs    08/11/17 1901 08/12/17 0534  WBC 5.3 5.6  HGB 13.4 12.9*  HCT 38.6* 37.6*  PLT 224 192   Recent Labs    08/12/17 0534 08/13/17 0416  NA 138 137  K 4.0 4.1  CL 105 103  CO2 23 28  GLUCOSE 96 108*  BUN 27* 27*  CREATININE 1.48* 1.43*  CALCIUM 9.1 9.1   No results for input(s): LABPROT, INR in the last 72 hours.  Imaging: Dg Chest 1 View  Result Date: 08/13/2017 CLINICAL DATA:  Status post right chest tube placement for a pneumothorax. EXAM: CHEST  1 VIEW COMPARISON:  PA and lateral chest 08/12/2017. FINDINGS: New pigtail catheter is in place in the right chest. The right lung is re-expanded. No pneumothorax is identified. Lungs are clear. Heart size is enlarged. No pleural effusion. IMPRESSION: Resolved right pneumothorax after chest tube placement. No new abnormality. Electronically Signed   By: Inge Rise M.D.   On: 08/13/2017 10:19    Assessment/Plan: 72 yo male with pneumothorax on right, s/p chest tube placement  --given that there's an airleak, will continue chest tube to suction today.  Will assess tomorrow again with repeat CXR and if there is no more airleak, would consider transitioning to  waterseal.  Patient understands that airleak needs to improve before transitioning.   Melvyn Neth, [place]

## 2017-08-14 ENCOUNTER — Inpatient Hospital Stay: Payer: PPO

## 2017-08-14 DIAGNOSIS — J939 Pneumothorax, unspecified: Secondary | ICD-10-CM

## 2017-08-14 NOTE — Plan of Care (Signed)
Pt on water seal now.

## 2017-08-14 NOTE — Care Management Important Message (Signed)
Copy of signed IM left in patient's room.    

## 2017-08-14 NOTE — Progress Notes (Signed)
08/14/17  Patient's chest tube to waterseal.  Repeat CXR reviewed this afternoon.  There is a small apical right pneumothorax.  However, the patient is asymptomatic.  His airleak has improved and now it's only on forceful coughing and not just on normal expiration.  As patient is asymptomatic, will continue chest tube to waterseal and repeat CXR in AM.  Patient understands that if airleak worsens or he becomes symptomatic, the chest tube will be reconnected to suction.   Olean Ree, MD

## 2017-08-14 NOTE — Progress Notes (Signed)
[place] at [facility]                                                                                                                                                                                  Patient Demographics   Gavin Maxwell, is a 72 y.o. male, DOB - [birth year], TMA:[id number]  Admit date - 08/11/2017   Admitting Physician Hillary Bow, MD  Outpatient Primary MD for the patient is Idelle Crouch, MD   LOS - 3  Subjective: Patient feeling well sitting at the side of the bed.  Denies any complaints    Review of Systems:   CONSTITUTIONAL: No documented fever. No fatigue, weakness. No weight gain, no weight loss.  EYES: No blurry or double vision.  ENT: No tinnitus. No postnasal drip. No redness of the oropharynx.  RESPIRATORY: No cough, no wheeze, no hemoptysis. + dyspnea.  CARDIOVASCULAR: No chest pain. No orthopnea. No palpitations. No syncope.  GASTROINTESTINAL: No nausea, no vomiting or diarrhea. No abdominal pain. No melena or hematochezia.  GENITOURINARY: No dysuria or hematuria.  ENDOCRINE: No polyuria or nocturia. No heat or cold intolerance.  HEMATOLOGY: No anemia. No bruising. No bleeding.  INTEGUMENTARY: No rashes. No lesions.  MUSCULOSKELETAL: No arthritis. No swelling. No gout.  NEUROLOGIC: No numbness, tingling, or ataxia. No seizure-type activity.  PSYCHIATRIC: No anxiety. No insomnia. No ADD.    Vitals:   Vitals:   08/13/17 1710 08/13/17 1933 08/14/17 0500 08/14/17 0504  BP: 133/76 133/76  140/90  Pulse: 76 77  64  Resp:  18  18  Temp:  97.8 F (36.6 C)  97.8 F (36.6 C)  TempSrc:  Oral  Oral  SpO2:  97%  97%  Weight:   102.2 kg (225 lb 5 oz)   Height:        Wt Readings from Last 3 Encounters:  08/14/17 102.2 kg (225 lb 5 oz)  09/28/14 107.5 kg (236 lb 15.9 oz)  09/14/14 107.9 kg (237 lb 14 oz)     Intake/Output Summary (Last 24 hours) at 08/14/2017 0936 Last data filed at 08/14/2017 2563 Gross per 24  hour  Intake 720 ml  Output 1100 ml  Net -380 ml    Physical Exam:   GENERAL: Mild distress HEAD, EYES, EARS, NOSE AND THROAT: Atraumatic, normocephalic. Extraocular muscles are intact. Pupils equal and reactive to light. Sclerae anicteric. No conjunctival injection. No oro-pharyngeal erythema.  NECK: Supple. There is no jugular venous distention. No bruits, no lymphadenopathy, no thyromegaly.  HEART: Regular rate and rhythm,. No murmurs, no rubs, no clicks.  LUNGS: Decreased breath sounds at the right lung base with accessory muscle usage ABDOMEN:  Soft, flat, nontender, nondistended. Has good bowel sounds. No hepatosplenomegaly appreciated.  EXTREMITIES: No evidence of any cyanosis, clubbing, or peripheral edema.  +2 pedal and radial pulses bilaterally.  NEUROLOGIC: The patient is alert, awake, and oriented x3 with no focal motor or sensory deficits appreciated bilaterally.  SKIN: Moist and warm with no rashes appreciated.  Psych: Not anxious, depressed LN: No inguinal LN enlargement    Antibiotics   Anti-infectives (From admission, onward)   None      Medications   Scheduled Meds: . atorvastatin  80 mg Oral Q supper  . enoxaparin (LOVENOX) injection  40 mg Subcutaneous Q24H  . fenofibrate  160 mg Oral q morning - 10a  . ferrous sulfate  325 mg Oral Daily  . fluticasone  2 spray Each Nare Daily  . gabapentin  300 mg Oral BID  . metoprolol succinate  25 mg Oral QPM  . pantoprazole  40 mg Oral Daily  . senna-docusate  2 tablet Oral Daily  . sertraline  50 mg Oral QHS   Continuous Infusions: PRN Meds:.acetaminophen **OR** acetaminophen, albuterol, hydrALAZINE, HYDROcodone-acetaminophen, ondansetron **OR** ondansetron (ZOFRAN) IV, polyethylene glycol   Data Review:   Micro Results No results found for this or any previous visit (from the past 240 hour(s)).  Radiology Reports Dg Chest 1 View  Result Date: 08/13/2017 CLINICAL DATA:  Status post right chest tube  placement for a pneumothorax. EXAM: CHEST  1 VIEW COMPARISON:  PA and lateral chest 08/12/2017. FINDINGS: New pigtail catheter is in place in the right chest. The right lung is re-expanded. No pneumothorax is identified. Lungs are clear. Heart size is enlarged. No pleural effusion. IMPRESSION: Resolved right pneumothorax after chest tube placement. No new abnormality. Electronically Signed   By: Inge Rise M.D.   On: 08/13/2017 10:19   Dg Chest 2 View  Result Date: 08/12/2017 CLINICAL DATA:  Right pneumothorax. EXAM: CHEST - 2 VIEW COMPARISON:  Radiograph of Aug 11, 2017. FINDINGS: The heart size and mediastinal contours are within normal limits. Left lung is clear. Large right pneumothorax has recurred. Chest tube appears to have been completely withdrawn from the thoracic space. Status post bilateral arthroplasties. IMPRESSION: Large right pneumothorax has recurred. Chest tube appears to have been completely withdrawn from thoracic space. Critical Value/emergent results were called by telephone at the time of interpretation on 08/12/2017 at 8:46 am to Alanson Puls, the patient's nurse, who verbally acknowledged these results and will immediately contact the physician. Electronically Signed   By: Marijo Conception, M.D.   On: 08/12/2017 08:47   Dg Chest Port 1 View  Result Date: 08/14/2017 CLINICAL DATA:  Right pneumothorax. EXAM: PORTABLE CHEST 1 VIEW COMPARISON:  Radiograph of Aug 13, 2017. FINDINGS: Stable cardiomediastinal silhouette. Right-sided chest tube is unchanged in position. No definite pneumothorax is noted. No acute pulmonary disease is noted. No significant pleural effusion is noted. Status post bilateral shoulder arthroplasties. IMPRESSION: Stable position of right-sided chest tube. No definite pneumothorax is noted currently. Electronically Signed   By: Marijo Conception, M.D.   On: 08/14/2017 07:49   Dg Chest Portable 1 View  Result Date: 08/11/2017 CLINICAL DATA:  72 year old male  chest tube placement. EXAM: PORTABLE CHEST 1 VIEW COMPARISON:  1851 hours today. FINDINGS: Portable AP upright view at 1959 hours. A small caliber chest tube has been placed and courses to the right lung apex. The right pneumothorax appears virtually resolved with perhaps trace residual evident along the right hemidiaphragm surface. More normal appearing lung  volumes. Tortuous thoracic aorta. Other mediastinal contours are within normal limits. Visualized tracheal air column is within normal limits. No pulmonary edema, pleural effusion or confluent opacity. Extensive chronic postoperative and bony changes about both shoulders. No acute osseous abnormality identified. IMPRESSION: 1. Right chest tube placed with essentially resolved right pneumothorax; possible trace residual along the right hemidiaphragm. 2. No other acute cardiopulmonary abnormality. Electronically Signed   By: Genevie Ann M.D.   On: 08/11/2017 20:40   Dg Chest Portable 1 View  Result Date: 08/11/2017 CLINICAL DATA:  Chest pain, shortness of breath. EXAM: PORTABLE CHEST 1 VIEW COMPARISON:  CT scan of July 01, 2016. Radiographs of September 26, 2013. FINDINGS: The heart size and mediastinal contours are within normal limits. Left lung is clear. Large right pneumothorax is noted. Status post bilateral shoulder arthroplasties. IMPRESSION: Large right pneumothorax is noted. Critical Value/emergent results were called by telephone at the time of interpretation on 08/11/2017 at 7:38 pm to Dr. Nance Pear , who verbally acknowledged these results. Electronically Signed   By: Marijo Conception, M.D.   On: 08/11/2017 19:38   Ct Image Guided Drainage By Percutaneous Catheter  Result Date: 08/12/2017 INDICATION: Recurrent symptomatic pneumothorax. EXAM: CT IMAGE GUIDED DRAINAGE BY PERCUTANEOUS CATHETER COMPARISON:  Chest radiograph - earlier same day; 08/11/2017 MEDICATIONS: The patient is currently admitted to the hospital and receiving intravenous antibiotics.  The antibiotics were administered within an appropriate time frame prior to the initiation of the procedure. ANESTHESIA/SEDATION: Moderate (conscious) sedation was employed during this procedure. A total of Versed 1 mg and Fentanyl 50 mcg was administered intravenously. Moderate Sedation Time: 23 minutes. The patient's level of consciousness and vital signs were monitored continuously by radiology nursing throughout the procedure under my direct supervision. CONTRAST:  None COMPLICATIONS: None immediate. PROCEDURE: Informed written consent was obtained from the patient after a discussion of the risks, benefits and alternatives to treatment. The patient was placed supine on the CT gantry and a pre procedural CT was performed re-demonstrating the known large right-sided pneumothorax. The procedure was planned. A timeout was performed prior to the initiation of the procedure. The skin overlying the anterior superior with 1% lidocaine with epinephrine. Appropriate trajectory was planned with the use of a 22 gauge spinal needle. An 18 gauge trocar needle was advanced into the right pleural space and a short Amplatz super stiff wire was coiled within the right pleural space. Appropriate positioning was confirmed with a limited CT scan. The tract was serially dilated allowing placement of a 12 [place] all-purpose drainage catheter. Appropriate positioning was confirmed with a limited postprocedural CT scan. The chest tube was connected to a pleural vac device and wall suction. Chest tube was sutured in place and secured with a Stat Lock device. Dressings were applied. Completion images were obtained. The patient tolerated the procedure well without immediate post procedural complication. IMPRESSION: Successful CT guided placement of a 82 French all purpose drain catheter into the right pleural space with near complete resolution of large right-sided pneumothorax. Electronically Signed   By: Sandi Mariscal M.D.   On: 08/12/2017  11:53     CBC Recent Labs  Lab 08/11/17 1901 08/12/17 0534  WBC 5.3 5.6  HGB 13.4 12.9*  HCT 38.6* 37.6*  PLT 224 192  MCV 92.4 92.9  MCH 32.1 31.9  MCHC 34.7 34.3  RDW 13.7 13.3    Chemistries  Recent Labs  Lab 08/11/17 1901 08/12/17 0534 08/13/17 0416  NA 137 138 137  K 4.1  4.0 4.1  CL 103 105 103  CO2 25 23 28   GLUCOSE 103* 96 108*  BUN 31* 27* 27*  CREATININE 1.61* 1.48* 1.43*  CALCIUM 9.5 9.1 9.1   ------------------------------------------------------------------------------------------------------------------ estimated creatinine clearance is 55.2 mL/min (A) (by C-G formula based on SCr of 1.43 mg/dL (H)). ------------------------------------------------------------------------------------------------------------------ No results for input(s): HGBA1C in the last 72 hours. ------------------------------------------------------------------------------------------------------------------ No results for input(s): CHOL, HDL, LDLCALC, TRIG, CHOLHDL, LDLDIRECT in the last 72 hours. ------------------------------------------------------------------------------------------------------------------ No results for input(s): TSH, T4TOTAL, T3FREE, THYROIDAB in the last 72 hours.  Invalid input(s): FREET3 ------------------------------------------------------------------------------------------------------------------ No results for input(s): VITAMINB12, FOLATE, FERRITIN, TIBC, IRON, RETICCTPCT in the last 72 hours.  Coagulation profile No results for input(s): INR, PROTIME in the last 168 hours.  No results for input(s): DDIMER in the last 72 hours.  Cardiac Enzymes Recent Labs  Lab 08/11/17 1901  TROPONINI <0.03   ------------------------------------------------------------------------------------------------------------------ Invalid input(s): POCBNP    Assessment & Plan  Patient is a 72 year old admitted with right sided pneumothorax chest tube has come out,     *Right spontaneous pneumothorax Continue chest tube per surgery  *Acute hypoxic respiratory failure secondary to right pneumothorax.  Wean oxygen as tolerated.  Continue Nebulizers.  *Hypertension.  Continue home medications.  Blood pressure stable  *CKD stage III stable  *DVT prophylaxis resume Lovenox   I discussed the case with Dr. Hampton Abbot, patient medically stable I asked him if he can assume the care of the patient he stated that was fine.      Code Status Orders  (From admission, onward)        Start     Ordered   08/11/17 2110  Full code  Continuous     08/11/17 2110    Code Status History    Date Active Date Inactive Code Status Order ID Comments User Context   09/26/2014 1157 09/28/2014 1524 Full Code [id number]  Marchia Bond, MD Inpatient   10/04/2013 1520 10/05/2013 1650 Full Code [id number]  Johnny Bridge, MD Inpatient    Advance Directive Documentation     Most Recent Value  Type of Advance Directive  Healthcare Power of Attorney  Pre-existing out of facility DNR order (yellow form or pink MOST form)  -  "MOST" Form in Place?  -           Consults General surgery interventional radiology  DVT Prophylaxis SCDs for now until procedure done  Lab Results  Component Value Date   PLT 192 08/12/2017     Time Spent in minutes 25 minutes spent  Dustin Flock M.D on 08/14/2017 at 9:36 AM  Between 7am to 6pm - Pager - [phone]  After 6pm go to [email or web address] - Proofreader  [employer]   Office  [phone]

## 2017-08-14 NOTE — Progress Notes (Signed)
08/14/2017  Subjective: No acute distress.  He has had an intermittent air leak on his right chest tube.  No shortness of breath.  His CXR this morning shows no definite pneumothorax.  Vital signs: Temp:  [97.8 F (36.6 C)] 97.8 F (36.6 C) (05/10 0504) Pulse Rate:  [64-77] 64 (05/10 0504) Resp:  [18] 18 (05/10 0504) BP: (133-140)/(76-90) 140/90 (05/10 0504) SpO2:  [97 %] 97 % (05/10 0504) Weight:  [225 lb 5 oz (102.2 kg)] 225 lb 5 oz (102.2 kg) (05/10 0500)   Intake/Output: 05/09 0701 - 05/10 0700 In: 720 [P.O.:720] Out: 1225 [Urine:1225] Last BM Date: 08/13/17  Physical Exam: Constitutional: No acute distress Pulm:  Right chest tube placed anteriorly, well secured.  There is a small air leak on expiration.    Labs:  Recent Labs    08/11/17 1901 08/12/17 0534  WBC 5.3 5.6  HGB 13.4 12.9*  HCT 38.6* 37.6*  PLT 224 192   Recent Labs    08/12/17 0534 08/13/17 0416  NA 138 137  K 4.0 4.1  CL 105 103  CO2 23 28  GLUCOSE 96 108*  BUN 27* 27*  CREATININE 1.48* 1.43*  CALCIUM 9.1 9.1   No results for input(s): LABPROT, INR in the last 72 hours.  Imaging: Dg Chest Port 1 View  Result Date: 08/14/2017 CLINICAL DATA:  Right pneumothorax. EXAM: PORTABLE CHEST 1 VIEW COMPARISON:  Radiograph of Aug 13, 2017. FINDINGS: Stable cardiomediastinal silhouette. Right-sided chest tube is unchanged in position. No definite pneumothorax is noted. No acute pulmonary disease is noted. No significant pleural effusion is noted. Status post bilateral shoulder arthroplasties. IMPRESSION: Stable position of right-sided chest tube. No definite pneumothorax is noted currently. Electronically Signed   By: Marijo Conception, M.D.   On: 08/14/2017 07:49    Assessment/Plan: 72 yo male with right pneumothorax, s/p chest tube placement.  --given that lung remains expanded despite of small air leak, will try to place chest tube on waterseal this morning. --repeat cxr this afternoon to evaluate lung  expansion.  If worse ptx, would have to place back to suction. --continue diet, po pain medication as needed, home medications, and dvt prophylaxis.   Melvyn Neth, [place]

## 2017-08-15 ENCOUNTER — Inpatient Hospital Stay: Payer: PPO

## 2017-08-15 NOTE — Progress Notes (Signed)
08/15/2017  Subjective: No acute events overnight.  Patient denies any shortness of breath.  He reports he has been able to ambulate and sit on the chair without issues.  He has noticed that the air leak has decreased compared to yesterday.  Chest tube remains to waterseal and CXR this morning shows stable 10% apical ptx.  Vital signs: Temp:  [97.7 F (36.5 C)-97.8 F (36.6 C)] 97.8 F (36.6 C) (05/11 0446) Pulse Rate:  [67-90] 67 (05/11 0446) Resp:  [16] 16 (05/11 0446) BP: (123-135)/(64-74) 123/72 (05/11 0446) SpO2:  [96 %-100 %] 96 % (05/11 0949) Weight:  [222 lb 8 oz (100.9 kg)] 222 lb 8 oz (100.9 kg) (05/11 0446)   Intake/Output: 05/10 0701 - 05/11 0700 In: 780 [P.O.:780] Out: 1350 [Urine:1350] Last BM Date: 08/14/17  Physical Exam: Constitutional: No acute distress Pulm:  Right chest tube in place, to waterseal, with small air leak, definitely improved compared to yesterday.  Labs:  No results for input(s): WBC, HGB, HCT, PLT in the last 72 hours. Recent Labs    08/13/17 0416  NA 137  K 4.1  CL 103  CO2 28  GLUCOSE 108*  BUN 27*  CREATININE 1.43*  CALCIUM 9.1   No results for input(s): LABPROT, INR in the last 72 hours.  Imaging: Dg Chest Port 1 View  Result Date: 08/15/2017 CLINICAL DATA:  Pneumothorax. EXAM: PORTABLE CHEST 1 VIEW COMPARISON:  Aug 14, 2017 FINDINGS: The heart size and mediastinal contours are stable. Approximately 10% pneumothorax is identified in the right apex unchanged compared to prior exam. A right chest tube is identified unchanged. The left lung is clear. The visualized skeletal structures are stable. IMPRESSION: Approximately 10% right apical pneumothorax unchanged compared prior exam. These results will be called to the ordering clinician or representative by the Radiologist Assistant, and communication documented in the PACS or zVision Dashboard. Electronically Signed   By: Abelardo Diesel M.D.   On: 08/15/2017 07:15   Dg Chest Port 1  View  Result Date: 08/14/2017 CLINICAL DATA:  Followup right chest tube and pneumothorax. Ex-smoker. EXAM: PORTABLE CHEST 1 VIEW COMPARISON:  Earlier today. FINDINGS: A right chest pigtail pleural catheter is unchanged. There is an approximately 10% right apical pneumothorax. No mediastinal shift. Clear lungs. Normal sized heart. Tortuous and mildly calcified thoracic aorta. 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 IMPRESSION: Approximately 10% right apical pneumothorax. These results will be called to the ordering clinician or representative by the Radiologist Assistant, and communication documented in the PACS or zVision Dashboard. Electronically Signed   By: Claudie Revering M.D.   On: 08/14/2017 16:55    Assessment/Plan: 72 yo male with right pneumothorax  --Discussed with patient the benefit of keeping the chest tube to waterseal as long as the CXR was stable in order to cause less suction force on the air leak.  The hope would be that the air leak closes faster by there being less suction keeping it open.  Since his CXR has been stable, we can continue with this plan.   --Keep CT to waterseal.  Patient understands that if the air leak worsens or if there is increasing O2 requirement, we would place the tube back to suction. --Repeat CXR tomorrow.  If air leak remains but lung is expanding, would consider changing to a Heimlich valve and discharge patient but we'll see how things look in [place], [place]

## 2017-08-16 ENCOUNTER — Inpatient Hospital Stay: Payer: PPO

## 2017-08-16 NOTE — Progress Notes (Addendum)
ADDENDUM: Discussed patient with Dr. Genevive Bi, who advises to make patient NPO after midnight for possible talc pleuradesis, but Dr. Genevive Bi will plan to see patient early tomorrow morning to discuss management options. Patient is not currently scheduled for anything, and all options from further monitoring to Heimlich valve to surgical intervention were discussed with patient.  -- Marilynne Drivers Rosana Hoes, MD, [place]: [facility] - Partnering for exceptional care. Office: [phone]       SURGICAL PROGRESS NOTE (cpt [phone])  Hospital Day(s): 5.   Post op day(s):  Marland Kitchen   Interval History: Patient seen and examined, no acute events or new complaints overnight. Patient reports minimal Right peri-tube pain well-controlled with reportedly somewhat decreased air leak (described as previously with respiration, now only with movement or coughing), has been ambulating, denies fever/chills, CP (except at chest tube site), SOB, or N/V.  Review of Systems:  Constitutional: denies fever, chills  HEENT: denies cough or congestion  Respiratory: shortness of breath as per interval history Cardiovascular: chest pain as per interval history, denies palpitations  Gastrointestinal: denies abdominal pain, N/V, or diarrhea Genitourinary: denies burning with urination or urinary frequency Musculoskeletal: denies pain, decreased motor or sensation Integumentary: denies any other rashes or skin discolorations except post-thoracostomy tube placement Neurological: denies HA or vision/hearing changes   Vital signs in last 24 hours: [min-max] current  Temp:  [97.7 F (36.5 C)-98.1 F (36.7 C)] 97.7 F (36.5 C) (05/12 0237) Pulse Rate:  [68-100] 68 (05/12 0237) Resp:  [16] 16 (05/12 0237) BP: (121-139)/(73-84) 127/73 (05/12 0237) SpO2:  [95 %-98 %] 95 % (05/12 0237) Weight:  [228 lb 3.2 oz (103.5 kg)] 228 lb 3.2 oz (103.5 kg) (05/12 0237)     Height: 5\' 11"  (180.3 cm) Weight: 228 lb  3.2 oz (103.5 kg) BMI (Calculated): 31.84   Intake/Output this shift:  Total I/O In: -  Out: 350 [Urine:350]   Intake/Output last 2 shifts:  @IOLAST2SHIFTS @   Physical Exam:  Constitutional: alert, cooperative and no distress  HENT: normocephalic without obvious abnormality  Eyes: PERRL, EOM's grossly intact and symmetric  Neuro: CN II - XII grossly intact and symmetric without deficit  Respiratory: breathing non-labored at rest, Right thoracostomy tube well-secured with large air leak with movement and cough Cardiovascular: regular rate and sinus rhythm  Gastrointestinal: soft, non-tender, and non-distended Musculoskeletal: UE and LE FROM, no edema or wounds, motor and sensation grossly intact, NT   Labs:  CBC Latest Ref Rng & Units 08/12/2017 08/11/2017 09/28/2014  WBC 3.8 - 10.6 K/uL 5.6 5.3 5.6  Hemoglobin 13.0 - 18.0 g/dL 12.9(L) 13.4 9.5(L)  Hematocrit 40.0 - 52.0 % 37.6(L) 38.6(L) 28.0(L)  Platelets 150 - 440 K/uL 192 224 174   CMP Latest Ref Rng & Units 08/13/2017 08/12/2017 08/11/2017  Glucose 65 - 99 mg/dL 108(H) 96 103(H)  BUN 6 - 20 mg/dL 27(H) 27(H) 31(H)  Creatinine 0.61 - 1.24 mg/dL 1.43(H) 1.48(H) 1.61(H)  Sodium 135 - 145 mmol/L 137 138 137  Potassium 3.5 - 5.1 mmol/L 4.1 4.0 4.1  Chloride 101 - 111 mmol/L 103 105 103  CO2 22 - 32 mmol/L 28 23 25   Calcium 8.9 - 10.3 mg/dL 9.1 9.1 9.5  Total Protein 6.4 - 8.2 g/dL - - -  Total Bilirubin 0.2 - 1.0 mg/dL - - -  Alkaline Phos 50 - 136 Unit/L - - -  AST 15 - 37 Unit/L - - -  ALT 12 - 78 U/L - - -  Imaging studies:  Chest X-ray (08/16/2017) - personally reviewed, compared to prior studies, and discussed with patient Persistent small RIGHT apex pneumothorax despite thoracostomy tube.  Assessment/Plan: (ICD-10's: J93.11) 72 y.o. male with stable persistent small Right pneumothorax and somewhat decreased air leak compared to prior description with chest tube remaining to water seal (off suction, but still to Pleuravac  canister drainage) 4 days s/p IR replaced ED-placed pleural drain/thoracostomy tube for spontaneous Right pneumothorax, complicated by pertinent comorbidities including obesity (BMI 32), HTN, OSA on CPAP qhs, osteoarthritis, GERD with history of hiatal hernia, generalized anxiety disorder, major depression disorder, and a history of non-Hodgkin's lymphoma.   - will continue chest tube to water seal for now  - return to suction if worsening SOB, increasing O2 requirement  - check am CXR, will request Dr. Genevive Bi with thoracic surgery to evaluate tomorrow  - anticipate possible discharge home with Heimlich valve, outpatient follow-up  - medical management of comorbidities (home medications)  - DVT prophylaxis, ambulation encouraged  All of the above findings and recommendations were discussed with the patient, and all of patient's questions were answered to his expressed satisfaction.  -- Marilynne Drivers Rosana Hoes, MD, [place]: [facility] - Partnering for exceptional care. Office: [phone]

## 2017-08-17 LAB — MRSA PCR SCREENING: MRSA BY PCR: NEGATIVE

## 2017-08-17 NOTE — Care Management Important Message (Signed)
Copy of signed IM left in patient's room.    

## 2017-08-17 NOTE — Progress Notes (Signed)
  Patient ID: Gavin Maxwell, male   DOB: 04-22-1945, 72 y.o.   MRN: [id number]  HISTORY: This is a 72 year old gentleman who is admitted several days ago with a right-sided spontaneous pneumothorax.  He has had 2 chest tubes placed and has had a persistent and large air leak since the time of his admission.  He did have a CT scan done about a year ago which shows some bleb disease mostly in the right side.  He states that he was significantly short of breath when he came in the hospital but now feels much better.   Vitals:   08/17/17 0654 08/17/17 1426  BP:  134/83  Pulse: 88 94  Resp:  16  Temp:  97.8 F (36.6 C)  SpO2: 95% 93%     EXAM:    Resp: Lungs are clear on the left but slightly diminished on the right..  No respiratory distress, normal effort. Heart:  Regular without murmurs Abd:  Abdomen is soft, non distended and non tender. No masses are palpable.  There is no rebound and no guarding.  Neurological: Alert and oriented to person, place, and time. Coordination normal.  Skin: Skin is warm and dry. No rash noted. No diaphoretic. No erythema. No pallor.  Psychiatric: Normal mood and affect. Normal behavior. Judgment and thought content normal.  There is a moderate sized air leak with coughing.  Independent review of his chest x-ray shows a small apical pneumothorax.    ASSESSMENT: Spontaneous pneumothorax with a persistent air leak   PLAN:   I had a long discussion with the patient today regarding the options.  We discussed the role of suction versus no suction for management of air leaks.  We also discussed the possibility of a Heimlich valve being placed in him being discharged home.  Finally we discussed the option of thoracoscopic talc insufflation with possible blebectomy.  After an extensive review of the options patient has elected to undergo a preoperative bronchoscopy with right thoracoscopy possible blebectomy possible thoracotomy and talc pleurodesis.  I did  explain to him the indications and risks of the procedure.  Risks of bleeding, infection, persistent air leak or recurrence and death were all reviewed.  He would like [place] to proceed.  We will try to get this placed on the schedule for tomorrow.    Nestor Lewandowsky, MDPatient ID: Gavin Maxwell, male   DOB: [birth year], 72 y.o.   MRN: [id number]

## 2017-08-18 ENCOUNTER — Inpatient Hospital Stay: Payer: PPO | Admitting: Registered Nurse

## 2017-08-18 ENCOUNTER — Inpatient Hospital Stay: Payer: PPO

## 2017-08-18 ENCOUNTER — Encounter
Admission: EM | Disposition: A | Discharge status: Home / Self Care | Payer: Self-pay | Source: Home / Self Care | Attending: Surgery

## 2017-08-18 ENCOUNTER — Encounter: Payer: Self-pay | Admitting: *Deleted

## 2017-08-18 DIAGNOSIS — J9311 Primary spontaneous pneumothorax: Principal | ICD-10-CM

## 2017-08-18 HISTORY — PX: VIDEO ASSISTED THORACOSCOPY (VATS) W/TALC PLEUADESIS: SHX6168

## 2017-08-18 SURGERY — VIDEO ASSISTED THORACOSCOPY (VATS) W/TALC PLEUADESIS
Anesthesia: General | Laterality: Right | Wound class: Clean Contaminated

## 2017-08-18 MED ORDER — BUPIVACAINE HCL (PF) 0.25 % IJ SOLN
INTRAMUSCULAR | Status: DC | PRN
  Administered 2017-08-18: 30 mL

## 2017-08-18 MED ORDER — CEFAZOLIN SODIUM 1 G IJ SOLR
INTRAMUSCULAR | Status: AC
  Filled 2017-08-18: qty 10

## 2017-08-18 MED ORDER — FENTANYL CITRATE (PF) 100 MCG/2ML IJ SOLN
INTRAMUSCULAR | Status: DC | PRN
  Administered 2017-08-18: 100 ug via INTRAVENOUS
  Administered 2017-08-18 (×3): 50 ug via INTRAVENOUS

## 2017-08-18 MED ORDER — FENTANYL CITRATE (PF) 100 MCG/2ML IJ SOLN: 25.0000 ug | INTRAMUSCULAR | Status: DC | PRN

## 2017-08-18 MED ORDER — DEXAMETHASONE SODIUM PHOSPHATE 10 MG/ML IJ SOLN
INTRAMUSCULAR | Status: DC | PRN
  Administered 2017-08-18: 10 mg via INTRAVENOUS

## 2017-08-18 MED ORDER — SUCCINYLCHOLINE CHLORIDE 20 MG/ML IJ SOLN
INTRAMUSCULAR | Status: AC
  Filled 2017-08-18: qty 1

## 2017-08-18 MED ORDER — GLYCOPYRROLATE 0.2 MG/ML IJ SOLN
INTRAMUSCULAR | Status: AC
  Filled 2017-08-18: qty 1

## 2017-08-18 MED ORDER — FENTANYL CITRATE (PF) 100 MCG/2ML IJ SOLN
INTRAMUSCULAR | Status: AC
  Administered 2017-08-18: 25 ug via INTRAVENOUS
  Filled 2017-08-18: qty 2

## 2017-08-18 MED ORDER — MEPERIDINE HCL 50 MG/ML IJ SOLN: 6.2500 mg | INTRAMUSCULAR | Status: DC | PRN

## 2017-08-18 MED ORDER — PHENYLEPHRINE HCL 10 MG/ML IJ SOLN
INTRAMUSCULAR | Status: DC | PRN
  Administered 2017-08-18 (×2): 200 ug via INTRAVENOUS
  Administered 2017-08-18: 100 ug via INTRAVENOUS
  Administered 2017-08-18 (×2): 200 ug via INTRAVENOUS

## 2017-08-18 MED ORDER — EPHEDRINE SULFATE 50 MG/ML IJ SOLN
INTRAMUSCULAR | Status: AC
  Filled 2017-08-18: qty 1

## 2017-08-18 MED ORDER — PROPOFOL 10 MG/ML IV BOLUS
INTRAVENOUS | Status: AC
  Filled 2017-08-18: qty 40

## 2017-08-18 MED ORDER — ACETAMINOPHEN 10 MG/ML IV SOLN
INTRAVENOUS | Status: AC
  Filled 2017-08-18: qty 100

## 2017-08-18 MED ORDER — ONDANSETRON HCL 4 MG/2ML IJ SOLN
INTRAMUSCULAR | Status: AC
  Filled 2017-08-18: qty 2

## 2017-08-18 MED ORDER — LACTATED RINGERS IV SOLN
INTRAVENOUS | Status: DC | PRN
  Administered 2017-08-18: 11:00:00 via INTRAVENOUS

## 2017-08-18 MED ORDER — SUGAMMADEX SODIUM 500 MG/5ML IV SOLN
INTRAVENOUS | Status: DC | PRN
  Administered 2017-08-18: 400 mg via INTRAVENOUS

## 2017-08-18 MED ORDER — PHENYLEPHRINE HCL 10 MG/ML IJ SOLN
INTRAMUSCULAR | Status: DC | PRN
  Administered 2017-08-18: 25 ug/min via INTRAVENOUS

## 2017-08-18 MED ORDER — CEFAZOLIN SODIUM-DEXTROSE 2-4 GM/100ML-% IV SOLN
2.0000 g | Freq: Three times a day (TID) | INTRAVENOUS | Status: AC
Start: 2017-08-18 — End: 2017-08-19
  Administered 2017-08-18 – 2017-08-19 (×2): 2 g via INTRAVENOUS
  Filled 2017-08-18 (×2): qty 100

## 2017-08-18 MED ORDER — MIDAZOLAM HCL 2 MG/2ML IJ SOLN
INTRAMUSCULAR | Status: DC | PRN
  Administered 2017-08-18: 1 mg via INTRAVENOUS

## 2017-08-18 MED ORDER — PHENYLEPHRINE HCL 10 MG/ML IJ SOLN
INTRAMUSCULAR | Status: AC
  Filled 2017-08-18: qty 1

## 2017-08-18 MED ORDER — CEFAZOLIN SODIUM-DEXTROSE 2-3 GM-%(50ML) IV SOLR
INTRAVENOUS | Status: DC | PRN
  Administered 2017-08-18: 2 g via INTRAVENOUS

## 2017-08-18 MED ORDER — CHLORHEXIDINE GLUCONATE CLOTH 2 % EX PADS
6.0000 | MEDICATED_PAD | Freq: Once | CUTANEOUS | Status: AC
  Administered 2017-08-18: 6 via TOPICAL

## 2017-08-18 MED ORDER — LIDOCAINE HCL (PF) 2 % IJ SOLN
INTRAMUSCULAR | Status: AC
  Filled 2017-08-18: qty 10

## 2017-08-18 MED ORDER — DEXAMETHASONE SODIUM PHOSPHATE 10 MG/ML IJ SOLN
INTRAMUSCULAR | Status: AC
  Filled 2017-08-18: qty 1

## 2017-08-18 MED ORDER — HYDROCODONE-ACETAMINOPHEN 5-325 MG PO TABS
1.0000 | ORAL_TABLET | ORAL | Status: DC | PRN
  Administered 2017-08-18: 1 via ORAL
  Administered 2017-08-18 – 2017-08-20 (×7): 2 via ORAL
  Administered 2017-08-20: 1 via ORAL
  Administered 2017-08-20 (×2): 2 via ORAL
  Administered 2017-08-21 (×2): 1 via ORAL
  Filled 2017-08-18 (×3): qty 2
  Filled 2017-08-18: qty 1
  Filled 2017-08-18 (×3): qty 2
  Filled 2017-08-18: qty 1
  Filled 2017-08-18 (×4): qty 2
  Filled 2017-08-18: qty 1

## 2017-08-18 MED ORDER — TALC (STERITALC) POWDER FOR INTRAPLEURAL USE
INTRAPLEURAL | Status: AC
  Filled 2017-08-18: qty 4

## 2017-08-18 MED ORDER — ALBUTEROL SULFATE (2.5 MG/3ML) 0.083% IN NEBU: 2.5000 mg | INHALATION_SOLUTION | RESPIRATORY_TRACT | Status: DC

## 2017-08-18 MED ORDER — ROCURONIUM BROMIDE 50 MG/5ML IV SOLN
INTRAVENOUS | Status: AC
  Filled 2017-08-18: qty 2

## 2017-08-18 MED ORDER — FENTANYL CITRATE (PF) 100 MCG/2ML IJ SOLN
INTRAMUSCULAR | Status: AC
Start: 2017-08-18 — End: 2017-08-18
  Administered 2017-08-18: 25 ug via INTRAVENOUS
  Filled 2017-08-18: qty 2

## 2017-08-18 MED ORDER — ONDANSETRON HCL 4 MG/2ML IJ SOLN
INTRAMUSCULAR | Status: DC | PRN
  Administered 2017-08-18: 4 mg via INTRAVENOUS

## 2017-08-18 MED ORDER — PROMETHAZINE HCL 25 MG/ML IJ SOLN: 6.2500 mg | INTRAMUSCULAR | Status: DC | PRN

## 2017-08-18 MED ORDER — ACETAMINOPHEN 10 MG/ML IV SOLN
INTRAVENOUS | Status: DC | PRN
  Administered 2017-08-18: 1000 mg via INTRAVENOUS

## 2017-08-18 MED ORDER — BUPIVACAINE HCL (PF) 0.25 % IJ SOLN
INTRAMUSCULAR | Status: AC
Start: 2017-08-18 — End: ?
  Filled 2017-08-18: qty 30

## 2017-08-18 MED ORDER — ALBUTEROL SULFATE (2.5 MG/3ML) 0.083% IN NEBU
2.5000 mg | INHALATION_SOLUTION | Freq: Four times a day (QID) | RESPIRATORY_TRACT | Status: DC
  Administered 2017-08-18 – 2017-08-19 (×2): 2.5 mg via RESPIRATORY_TRACT
  Filled 2017-08-18 (×2): qty 3

## 2017-08-18 MED ORDER — ROCURONIUM BROMIDE 100 MG/10ML IV SOLN
INTRAVENOUS | Status: DC | PRN
  Administered 2017-08-18: 50 mg via INTRAVENOUS
  Administered 2017-08-18: 30 mg via INTRAVENOUS

## 2017-08-18 MED ORDER — SUGAMMADEX SODIUM 500 MG/5ML IV SOLN
INTRAVENOUS | Status: AC
  Filled 2017-08-18: qty 5

## 2017-08-18 MED ORDER — DEXTROSE-NACL 5-0.45 % IV SOLN
INTRAVENOUS | Status: DC
  Administered 2017-08-18 – 2017-08-20 (×4): via INTRAVENOUS

## 2017-08-18 MED ORDER — FENTANYL CITRATE (PF) 100 MCG/2ML IJ SOLN
25.0000 ug | INTRAMUSCULAR | Status: AC | PRN
Start: 2017-08-18 — End: 2017-08-18
  Administered 2017-08-18 (×6): 25 ug via INTRAVENOUS

## 2017-08-18 MED ORDER — PROPOFOL 10 MG/ML IV BOLUS
INTRAVENOUS | Status: DC | PRN
  Administered 2017-08-18: 150 mg via INTRAVENOUS

## 2017-08-18 MED ORDER — TALC 5 G PL SUSR
INTRAPLEURAL | Status: DC | PRN
  Administered 2017-08-18: 4 g via INTRAPLEURAL

## 2017-08-18 MED ORDER — FENTANYL CITRATE (PF) 250 MCG/5ML IJ SOLN
INTRAMUSCULAR | Status: AC
  Filled 2017-08-18: qty 5

## 2017-08-18 MED ORDER — MIDAZOLAM HCL 2 MG/2ML IJ SOLN
INTRAMUSCULAR | Status: AC
  Filled 2017-08-18: qty 2

## 2017-08-18 MED ORDER — LIDOCAINE HCL (CARDIAC) PF 100 MG/5ML IV SOSY
PREFILLED_SYRINGE | INTRAVENOUS | Status: DC | PRN
  Administered 2017-08-18: 100 mg via INTRAVENOUS

## 2017-08-18 SURGICAL SUPPLY — 57 items
BNDG COHESIVE 4X5 TAN STRL (GAUZE/BANDAGES/DRESSINGS) ×2 IMPLANT
BRONCHOSCOPE PED SLIM DISP (MISCELLANEOUS) ×2 IMPLANT
CATH THOR STR 28F  SOFT WA (CATHETERS) ×1
CATH THOR STR 28F SOFT WA (CATHETERS) ×1 IMPLANT
CATH URET ROBINSON 16FR STRL (CATHETERS) IMPLANT
CHLORAPREP W/TINT 26ML (MISCELLANEOUS) ×4 IMPLANT
CUTTER ECHEON FLEX ENDO 45 340 (ENDOMECHANICALS) ×2 IMPLANT
DEFOGGER SCOPE WARMER CLEARIFY (MISCELLANEOUS) ×2 IMPLANT
DRAIN CHEST DRY SUCT SGL (MISCELLANEOUS) ×2 IMPLANT
DRAPE C-SECTION (MISCELLANEOUS) ×2 IMPLANT
DRAPE MAG INST 16X20 L/F (DRAPES) ×2 IMPLANT
DRAPE POUCH INSTRU U-SHP 10X18 (DRAPES) ×2 IMPLANT
DRSG OPSITE POSTOP 3X4 (GAUZE/BANDAGES/DRESSINGS) ×6 IMPLANT
ELECT BLADE 6 FLAT ULTRCLN (ELECTRODE) ×2 IMPLANT
ELECT BLADE 6.5 EXT (BLADE) ×2 IMPLANT
ELECT CAUTERY BLADE TIP 2.5 (TIP) ×2
ELECT REM PT RETURN 9FT ADLT (ELECTROSURGICAL) ×2
ELECTRODE CAUTERY BLDE TIP 2.5 (TIP) ×1 IMPLANT
ELECTRODE REM PT RTRN 9FT ADLT (ELECTROSURGICAL) ×1 IMPLANT
GAUZE SPONGE 4X4 12PLY STRL (GAUZE/BANDAGES/DRESSINGS) ×2 IMPLANT
GLOVE SURG SYN 7.5  E (GLOVE) ×2
GLOVE SURG SYN 7.5 E (GLOVE) ×2 IMPLANT
GOWN STRL REUS W/ TWL LRG LVL3 (GOWN DISPOSABLE) ×4 IMPLANT
GOWN STRL REUS W/TWL LRG LVL3 (GOWN DISPOSABLE) ×4
KIT TURNOVER KIT A (KITS) ×2 IMPLANT
LABEL OR SOLS (LABEL) ×2 IMPLANT
LOOP RED MAXI  1X406MM (MISCELLANEOUS) ×1
LOOP VESSEL MAXI 1X406 RED (MISCELLANEOUS) ×1 IMPLANT
MARKER SKIN DUAL TIP RULER LAB (MISCELLANEOUS) ×2 IMPLANT
NEEDLE SPNL 20GX3.5 QUINCKE YW (NEEDLE) ×2 IMPLANT
NS IRRIG 500ML POUR BTL (IV SOLUTION) ×2 IMPLANT
PACK BASIN MAJOR ARMC (MISCELLANEOUS) ×2 IMPLANT
SCISSORS METZENBAUM CVD 33 (INSTRUMENTS) IMPLANT
SPONGE KITTNER 5P (MISCELLANEOUS) ×4 IMPLANT
STAPLE RELOAD 45MM GOLD (STAPLE) ×10 IMPLANT
STAPLER SKIN PROX 35W (STAPLE) ×2 IMPLANT
STAPLER VASCULAR ECHELON 35 (CUTTER) ×2 IMPLANT
STRIP CLOSURE SKIN 1/2X4 (GAUZE/BANDAGES/DRESSINGS) ×2 IMPLANT
SUT ETHILON 4-0 (SUTURE)
SUT ETHILON 4-0 FS2 18XMFL BLK (SUTURE)
SUT MNCRL AB 3-0 PS2 27 (SUTURE) ×4 IMPLANT
SUT SILK 1 SH (SUTURE) ×6 IMPLANT
SUT VIC AB 0 CT1 36 (SUTURE) ×4 IMPLANT
SUT VIC AB 2-0 CT1 27 (SUTURE) ×2
SUT VIC AB 2-0 CT1 TAPERPNT 27 (SUTURE) ×2 IMPLANT
SUT VIC AB 2-0 CT2 27 (SUTURE) ×4 IMPLANT
SUT VICRYL 2 TP 1 (SUTURE) ×8 IMPLANT
SUTURE ETHLN 4-0 FS2 18XMF BLK (SUTURE) IMPLANT
SYR 30ML LL (SYRINGE) ×2 IMPLANT
SYR BULB IRRIG 60ML STRL (SYRINGE) ×2 IMPLANT
TAPE ADH 3 LX (MISCELLANEOUS) ×2 IMPLANT
TAPE TRANSPORE STRL 2 31045 (GAUZE/BANDAGES/DRESSINGS) ×2 IMPLANT
TRAY FOLEY MTR SLVR 16FR STAT (SET/KITS/TRAYS/PACK) ×2 IMPLANT
TROCAR FLEXIPATH 20X80 (ENDOMECHANICALS) ×4 IMPLANT
TROCAR FLEXIPATH THORACIC 15MM (ENDOMECHANICALS) IMPLANT
WATER STERILE IRR 1000ML POUR (IV SOLUTION) ×2 IMPLANT
YANKAUER SUCT BULB TIP FLEX NO (MISCELLANEOUS) ×2 IMPLANT

## 2017-08-18 NOTE — Transfer of Care (Signed)
Immediate Anesthesia Transfer of Care Note  Patient: Gavin Maxwell  Procedure(s) Performed: Procedure(s): VIDEO ASSISTED THORACOSCOPY (VATS)POSSIBLE  W/TALC PLEUADESIS.POSSIBLE BLEBECTOMY (Right)  Patient Location: PACU  Anesthesia Type:General  Level of Consciousness: sedated  Airway & Oxygen Therapy: Patient Spontanous Breathing and Patient connected to face mask oxygen  Post-op Assessment: Report given to RN and Post -op Vital signs reviewed and stable  Post vital signs: Reviewed and stable  Last Vitals:  Vitals:   08/18/17 1004 08/18/17 1315  BP: 136/82 108/68  Pulse: 82 77  Resp: 18 14  Temp: (!) 36.2 C (P) 36.4 C  SpO2: 16% 83%    Complications: No apparent anesthesia complications

## 2017-08-18 NOTE — Op Note (Signed)
  08/18/2017  1:30 PM  PATIENT:  [name]  72 y.o. male  PRE-OPERATIVE DIAGNOSIS: Spontaneous pneumothorax with persistent air leak  POST-OPERATIVE DIAGNOSIS: Spontaneous pneumothorax with persistent air leak  PROCEDURE: Right thoracoscopy with apical blebectomy and talc pleurodesis  SURGEON:  Surgeon(s) and Role:    Nestor Lewandowsky, MD - Primary  ASSISTANTS: None  ANESTHESIA: General  INDICATIONS FOR PROCEDURE this is a 72 year old gentleman who presented with a spontaneous pneumothorax.  He had 2 chest tubes inserted and had a persistent air leak.  The indications and risks of the procedure were explained the patient gave his informed consent  DICTATION: Patient was brought to the operating suite and placed in the supine position.  General endotracheal anesthesia was given through a double-lumen tube.  Patient was positioned for right thoracoscopy.  All pressure points carefully padded.  The patient was prepped and draped in usual sterile fashion.  We began by making a 20 mm thoracoscopy port in the lower aspect of the right hemithorax.  Once the chest was entered we then placed the thoracoscope and could see at the apex of the lung that there is a 2 cm bleb which had a apparent hole in it causing the air leak.  There are no other blebs that were seen and therefore was elected to make 2 additional port sites to excise the bleb.  An anterior port site was created again using a 20 mm trocar posteriorly a single stab wound was made just to accommodate a grasping clamp.  We then excised the apex of the lung using an endoscopic stapler.  The intrapleural space was free of any significant disease.  4 g of sterile talc were then insufflated under direct visualization coating the entire hemithorax.  A single 34 French chest tube was inserted through the most inferior port and brought out through a separate stab wound.  The chest tube was secured with silk.  Thoracoscopy was again carried out  and all port sites were clean and dry.  The wounds were then closed with multiple layers running absorbable sutures.  The skin was closed with nylon.  At the completion of the procedure all sponge needle and instrument counts were correct as reported to me.  Patient was extubated and taken to the recovery room in stable condition.   Nestor Lewandowsky, MD

## 2017-08-18 NOTE — Anesthesia Postprocedure Evaluation (Signed)
Anesthesia Post Note  Patient: Gavin Maxwell  Procedure(s) Performed: VIDEO ASSISTED THORACOSCOPY (VATS)POSSIBLE  W/TALC PLEUADESIS.POSSIBLE BLEBECTOMY (Right )  Patient location during evaluation: PACU Anesthesia Type: General Level of consciousness: awake and alert and oriented Pain management: pain level controlled Vital Signs Assessment: post-procedure vital signs reviewed and stable Respiratory status: spontaneous breathing, nonlabored ventilation and respiratory function stable Cardiovascular status: blood pressure returned to baseline and stable Postop Assessment: no signs of nausea or vomiting Anesthetic complications: no     Last Vitals:  Vitals:   08/18/17 1355 08/18/17 1401  BP: 116/73   Pulse: 70 71  Resp: 11 15  Temp:    SpO2: 98% 100%    Last Pain:  Vitals:   08/18/17 1401  TempSrc:   PainSc: 6                  Khadijah Mastrianni

## 2017-08-18 NOTE — Anesthesia Post-op Follow-up Note (Signed)
Anesthesia QCDR form completed.        

## 2017-08-18 NOTE — Progress Notes (Signed)
  Patient ID: Gavin Maxwell, male   DOB: 15-Jun-1945, 72 y.o.   MRN: [id number]  HISTORY: He had a quiet night.  He states that he continues to have an air leak whenever he moves and is not short of breath at rest.   Vitals:   08/17/17 2051 08/18/17 0427  BP: 121/74 139/79  Pulse: 90 70  Resp: 16 20  Temp: 98 F (36.7 C) 97.7 F (36.5 C)  SpO2: 98% 100%     EXAM:    Resp: Lungs are clear bilaterally.  No respiratory distress, normal effort. Heart:  Regular without murmurs Abd:  Abdomen is soft, non distended and non tender. No masses are palpable.  There is no rebound and no guarding.  Neurological: Alert and oriented to person, place, and time. Coordination normal.  Skin: Skin is warm and dry. No rash noted. No diaphoretic. No erythema. No pallor.  Psychiatric: Normal mood and affect. Normal behavior. Judgment and thought content normal.    ASSESSMENT: He does have a air leak with coughing or moving.   PLAN:   I have again reviewed with him the indications and risks of any surgical intervention.  The air leak may be subjectively slightly smaller today.  After an extensive discussion with him and his family yesterday and again this morning he would like to proceed with thoracoscopic talc insufflation.  We will plan for that later today.    Nestor Lewandowsky, MD

## 2017-08-18 NOTE — Anesthesia Procedure Notes (Signed)
Procedure Name: Intubation Date/Time: 08/18/2017 10:56 AM Performed by: Doreen Salvage, CRNA Pre-anesthesia Checklist: Patient identified, Patient being monitored, Timeout performed, Emergency Drugs available and Suction available Patient Re-evaluated:Patient Re-evaluated prior to induction Oxygen Delivery Method: Circle system utilized Preoxygenation: Pre-oxygenation with 100% oxygen Induction Type: IV induction Ventilation: Mask ventilation without difficulty Laryngoscope Size: Mac, 3 and McGraph Grade View: Grade I Tube type: Oral Endobronchial tube: Left, Double lumen EBT, EBT position confirmed by auscultation and EBT position confirmed by fiberoptic bronchoscope and 39 Fr Number of attempts: 2 Airway Equipment and Method: Stylet Placement Confirmation: ETT inserted through vocal cords under direct vision,  positive ETCO2 and breath sounds checked- equal and bilateral Secured at: 31 cm Tube secured with: Tape Dental Injury: Teeth and Oropharynx as per pre-operative assessment

## 2017-08-18 NOTE — Anesthesia Preprocedure Evaluation (Signed)
Anesthesia Evaluation  Patient identified by MRN, date of birth, ID band Patient awake    Reviewed: Allergy & Precautions, NPO status , Patient's Chart, lab work & pertinent test results  History of Anesthesia Complications Negative for: history of anesthetic complications  Airway Mallampati: II  TM Distance: >3 FB Neck ROM: Full    Dental  (+) Poor Dentition, Chipped, Missing   Pulmonary sleep apnea and Continuous Positive Airway Pressure Ventilation , neg COPD, former smoker,  R pneumothorax    breath sounds clear to auscultation- rhonchi (-) wheezing      Cardiovascular hypertension, Pt. on medications (-) CAD, (-) Past MI, (-) Cardiac Stents and (-) CABG  Rhythm:Regular Rate:Normal - Systolic murmurs and - Diastolic murmurs    Neuro/Psych PSYCHIATRIC DISORDERS Anxiety Depression negative neurological ROS     GI/Hepatic Neg liver ROS, hiatal hernia, GERD  ,  Endo/Other  negative endocrine ROSneg diabetes  Renal/GU Renal InsufficiencyRenal disease     Musculoskeletal  (+) Arthritis ,   Abdominal (+) + obese,   Peds  Hematology negative hematology ROS (+)   Anesthesia Other Findings Past Medical History: No date: Anxiety No date: Cancer [facility])     Comment:  non hodgins  lymphoma No date: Depression No date: GERD (gastroesophageal reflux disease) No date: History of hiatal hernia No date: Hypertension 09/26/2014: Primary localized osteoarthritis of knee 10/04/2013: Primary localized osteoarthrosis, shoulder region, rotator  cuff arthropathy No date: Sleep apnea     Comment:  cpap   Reproductive/Obstetrics                             Anesthesia Physical Anesthesia Plan  ASA: II  Anesthesia Plan: General   Post-op Pain Management:    Induction: Intravenous  PONV Risk Score and Plan: 1 and Ondansetron and Midazolam  Airway Management Planned: Oral ETT  Additional Equipment:    Intra-op Plan:   Post-operative Plan: Extubation in OR  Informed Consent: I have reviewed the patients History and Physical, chart, labs and discussed the procedure including the risks, benefits and alternatives for the proposed anesthesia with the patient or authorized representative who has indicated his/her understanding and acceptance.   Dental advisory given  Plan Discussed with: CRNA and Anesthesiologist  Anesthesia Plan Comments:         Anesthesia Quick Evaluation

## 2017-08-19 ENCOUNTER — Inpatient Hospital Stay: Payer: PPO

## 2017-08-19 ENCOUNTER — Encounter: Payer: Self-pay | Admitting: Radiology

## 2017-08-19 LAB — FIBRIN DERIVATIVES D-DIMER (ARMC ONLY): Fibrin derivatives D-dimer (ARMC): 1261.62 ng/mL (FEU) — ABNORMAL HIGH (ref 0.00–499.00)

## 2017-08-19 MED ORDER — ALBUTEROL SULFATE (2.5 MG/3ML) 0.083% IN NEBU
2.5000 mg | INHALATION_SOLUTION | Freq: Three times a day (TID) | RESPIRATORY_TRACT | Status: DC
  Administered 2017-08-19 – 2017-08-20 (×3): 2.5 mg via RESPIRATORY_TRACT
  Filled 2017-08-19 (×4): qty 3

## 2017-08-19 MED ORDER — CYCLOBENZAPRINE HCL 10 MG PO TABS
5.0000 mg | ORAL_TABLET | Freq: Three times a day (TID) | ORAL | Status: DC
  Administered 2017-08-19 – 2017-08-21 (×6): 5 mg via ORAL
  Filled 2017-08-19 (×6): qty 1

## 2017-08-19 MED ORDER — GABAPENTIN 400 MG PO CAPS
400.0000 mg | ORAL_CAPSULE | Freq: Three times a day (TID) | ORAL | Status: DC
  Administered 2017-08-19 – 2017-08-21 (×6): 400 mg via ORAL
  Filled 2017-08-19 (×6): qty 1

## 2017-08-19 MED ORDER — IOPAMIDOL (ISOVUE-370) INJECTION 76%
75.0000 mL | Freq: Once | INTRAVENOUS | Status: AC | PRN
  Administered 2017-08-19: 75 mL via INTRAVENOUS

## 2017-08-19 MED ORDER — KETOROLAC TROMETHAMINE 15 MG/ML IJ SOLN: 15.0000 mg | Freq: Four times a day (QID) | INTRAMUSCULAR | Status: DC

## 2017-08-19 NOTE — Progress Notes (Signed)
MD was notified of intermittent tachycardic to 150's . Internl medicine consulted.

## 2017-08-19 NOTE — Consult Note (Signed)
Patient seen and examined. Full note to follow.  D/w Dr Dahlia Byes & Nursing.  HR fluctuating - tachycardia is likely due to pain and discomfort. Will check d-dimer to make sure if we need CT Angio (to r/o PE).  Patient's followed by [facility] as an outpt

## 2017-08-19 NOTE — Progress Notes (Signed)
Pt. is having severe chest discomfort. Unable to take normal breathing. Very short breath than in the past. Pt. stated " something "catches" my breath., and severe pain that radiates to my back. Pt. Was educated that there is a chest tube inside his chest and it is normal to feel that. Pt. Stated that bec of that feeling, he is more short of breath than before. Hydrocodone was given but it's not helping. He wants something done. I paged Dr. Genevive Bi several times, but I have not received a call back at this time. So, I called Dr. Dahlia Byes and he gave me some order. I will attempt to call dr. Genevive Bi again later.

## 2017-08-19 NOTE — Progress Notes (Signed)
Patient ID: Gavin Maxwell, male   DOB: 1945-10-22, 72 y.o.   MRN: [id number]  Some discomfort with cough  No air leak seen.  Wounds clean and dry  CXRay from this morning shows very small apical pneumothorax  Will leave small pigtail to water seal and larger tube to suction today Remove Foley Physical Therapy  [employer].

## 2017-08-19 NOTE — Evaluation (Signed)
Physical Therapy Evaluation Patient Details Name: Gavin Maxwell MRN: [id number] DOB: June 05, 1945 Today's Date: 08/19/2017   History of Present Illness  72 yo male with onset of sepsis from PNA with spontaneous pneumothorax with chest tubes and tachycardia was admitted and now referred to PT.  Has had persistent air leak since chest tubes inserted.  PMHx:  anxiety, depression, non hodgkins lymphoma, OA and rotator cuff injury L shoulder, OA knees, sleep apnea  Clinical Impression  Pt was seen for evaluation of mobiltiy with use of chair to give light support to LUE and PT assisting RUE.  Pt is limited by his SOB and chest tubes and has suspected PE but per nsg is permitted to initiate gait.  Follow acutely but ck on his MD permission to continue mobility.  Pt is hoping he is moving well enough to go directly home.  Has chronic fracture and instability on L shoulder and should be able to use RW but with care.    Follow Up Recommendations SNF    Equipment Recommendations  Rolling walker with 5" wheels    Recommendations for Other Services       Precautions / Restrictions Precautions Precautions: Fall(telemetry) Restrictions Weight Bearing Restrictions: No      Mobility  Bed Mobility Overal bed mobility: Needs Assistance Bed Mobility: Supine to Sit;Sit to Supine     Supine to sit: Mod assist Sit to supine: Mod assist   General bed mobility comments: mainly assisted trunk to sit up and legs to return to bed  Transfers Overall transfer level: Needs assistance Equipment used: 1 person hand held assist Transfers: Sit to/from Stand Sit to Stand: Mod assist         General transfer comment: mod assist to power up and steady in standing with LUE support on chair and RUE with PT    Ambulation/Gait Ambulation/Gait assistance: Min assist Ambulation Distance (Feet): 5 Feet Assistive device: 1 person hand held assist   Gait velocity: reduced Gait velocity interpretation: <1.8  ft/sec, indicate of risk for recurrent falls General Gait Details: sidestepping with HHA and care for chest tube lines, unsteady on his knees  Stairs            Wheelchair Mobility    Modified Rankin (Stroke Patients Only)       Balance Overall balance assessment: Needs assistance Sitting-balance support: Feet supported;Bilateral upper extremity supported Sitting balance-Leahy Scale: Fair     Standing balance support: Bilateral upper extremity supported;During functional activity Standing balance-Leahy Scale: Poor                               Pertinent Vitals/Pain Pain Assessment: No/denies pain    Home Living Family/patient expects to be discharged to:: Private residence Living Arrangements: Spouse/significant other;Children Available Help at Discharge: Family;Available PRN/intermittently(wife and daughter work) Type of Home: House Home Access: Stairs to enter     Home Layout: One level Home Equipment: Environmental consultant - 2 wheels;Cane - single point;Shower seat      Prior Function Level of Independence: Independent with assistive device(s)         Comments: was using a SPC at times due to injury to L shoulder     Hand Dominance   Dominant Hand: Right    Extremity/Trunk Assessment   Upper Extremity Assessment Upper Extremity Assessment: LUE deficits/detail LUE Deficits / Details: rotator cuff injury wiht instability    Lower Extremity Assessment Lower Extremity Assessment: Generalized weakness  Cervical / Trunk Assessment Cervical / Trunk Assessment: Normal  Communication   Communication: No difficulties  Cognition Arousal/Alertness: Awake/alert Behavior During Therapy: WFL for tasks assessed/performed Overall Cognitive Status: Within Functional Limits for tasks assessed                                        General Comments      Exercises     Assessment/Plan    PT Assessment Patient needs continued PT services   PT Problem List Decreased strength;Decreased range of motion;Decreased activity tolerance;Decreased balance;Decreased mobility;Decreased coordination;Decreased knowledge of use of DME;Decreased safety awareness;Cardiopulmonary status limiting activity;Obesity;Decreased skin integrity;Pain       PT Treatment Interventions DME instruction;Gait training;Functional mobility training;Therapeutic activities;Therapeutic exercise;Balance training;Neuromuscular re-education;Patient/family education    PT Goals (Current goals can be found in the Care Plan section)  Acute Rehab PT Goals Patient Stated Goal: to walk and go home PT Goal Formulation: With patient Time For Goal Achievement: 09/02/17 Potential to Achieve Goals: Good    Frequency Min 2X/week   Barriers to discharge Inaccessible home environment;Decreased caregiver support home with wife who may not be able to assist him    Co-evaluation               AM-PAC PT "6 Clicks" Daily Activity  Outcome Measure Difficulty turning over in bed (including adjusting bedclothes, sheets and blankets)?: A Little Difficulty moving from lying on back to sitting on the side of the bed? : Unable Difficulty sitting down on and standing up from a chair with arms (e.g., wheelchair, bedside commode, etc,.)?: Unable Help needed moving to and from a bed to chair (including a wheelchair)?: A Little Help needed walking in hospital room?: A Lot Help needed climbing 3-5 steps with a railing? : Total 6 Click Score: 11    End of Session Equipment Utilized During Treatment: Gait belt;Oxygen Activity Tolerance: Patient limited by fatigue;Treatment limited secondary to medical complications (Comment) Patient left: in bed;with call bell/phone within reach;with bed alarm set;with nursing/sitter in room Nurse Communication: Mobility status PT Visit Diagnosis: Unsteadiness on feet (R26.81);Muscle weakness (generalized) (M62.81);Difficulty in walking, not  elsewhere classified (R26.2);Adult, failure to thrive (R62.7)    Time: 9794-8016 PT Time Calculation (min) (ACUTE ONLY): 33 min   Charges:   PT Evaluation $PT Eval Moderate Complexity: 1 Mod PT Treatments $Therapeutic Activity: 8-22 mins   PT G Codes:   PT G-Codes **NOT FOR INPATIENT CLASS** Functional Assessment Tool Used: AM-PAC 6 Clicks Basic Mobility    Ramond Dial 08/19/2017, 8:35 PM   Mee Hives, PT MS Acute Rehab Dept. Number: [place] [facility] [place]

## 2017-08-19 NOTE — Consult Note (Signed)
[facility] [place] NAME: Gavin Maxwell    MR#:  [id number]  DATE OF BIRTH:  1945/05/11  DATE OF ADMISSION:  08/11/2017  PRIMARY CARE PHYSICIAN: Idelle Crouch, MD   REQUESTING/REFERRING PHYSICIAN: Nestor Lewandowsky MD  CHIEF COMPLAINT:   Chief Complaint  Patient presents with  . Shortness of Breath    HISTORY OF PRESENT ILLNESS:  Gavin Maxwell  is a 72 y.o. male with a known history of hypertension, non-Hodgkin's lymphoma, past tobacco use admitted with shortness of breath and chest pain. Noted to have spontaneous pneumothorax requiring chest tube. We are consulted as he's been tachycardic. Patient is dyspneic at rest. HR fluctuating between 150-160s and intermittently drops less than 100s. chest pain with coughing. PAST MEDICAL HISTORY:   Past Medical History:  Diagnosis Date  . Anxiety   . Cancer ([place])    non hodgins  lymphoma  . Depression   . GERD (gastroesophageal reflux disease)   . History of hiatal hernia   . Hypertension   . Primary localized osteoarthritis of knee 09/26/2014  . Primary localized osteoarthrosis, shoulder region, rotator cuff arthropathy 10/04/2013  . Sleep apnea    cpap    PAST SURGICAL HISTOIRY:   Past Surgical History:  Procedure Laterality Date  . CARDIAC CATHETERIZATION     20 yrs. ago  . CHOLECYSTECTOMY    . EYE SURGERY    . JOINT REPLACEMENT     hip  . NASAL SINUS SURGERY     x2  . REVERSE SHOULDER ARTHROPLASTY Right 10/04/2013   Procedure: REVERSE SHOULDER ARTHROPLASTY;  Surgeon: Johnny Bridge, MD;  Location: [facility];  Service: Orthopedics;  Laterality: Right;  . SHOULDER ACROMIOPLASTY     x 5 shoulder surgeries  . TOTAL KNEE ARTHROPLASTY Right 09/26/2014  . TOTAL KNEE ARTHROPLASTY Right 09/26/2014   Procedure: RIGHT TOTAL KNEE ARTHROPLASTY;  Surgeon: Marchia Bond, MD;  Location: [facility];  Service: Orthopedics;  Laterality: Right;  Marland Kitchen VIDEO ASSISTED THORACOSCOPY (VATS) W/TALC PLEUADESIS  Right 08/18/2017   Procedure: VIDEO ASSISTED THORACOSCOPY (VATS)POSSIBLE  W/TALC PLEUADESIS.POSSIBLE BLEBECTOMY;  Surgeon: Nestor Lewandowsky, MD;  Location: [facility];  Service: Thoracic;  Laterality: Right;    SOCIAL HISTORY:   Social History   Tobacco Use  . Smoking status: Former Smoker    Packs/day: 2.00    Years: 20.00    Pack years: 40.00    Types: Cigarettes  . Smokeless tobacco: Never Used  Substance Use Topics  . Alcohol use: No    FAMILY HISTORY:  History reviewed. No pertinent family history.  DRUG ALLERGIES:   Allergies  Allergen Reactions  . Augmentin [Amoxicillin-Pot Clavulanate] Nausea And Vomiting  . Celebrex [Celecoxib] Nausea And Vomiting  . Oxycodone Other (See Comments)    Confusion, makes him "crazy"    REVIEW OF SYSTEMS:  CONSTITUTIONAL: No fever, fatigue or weakness.  EYES: No blurred or double vision.  EARS, NOSE, AND THROAT: No tinnitus or ear pain.  RESPIRATORY: + cough, shortness of breath, no wheezing or hemoptysis.  CARDIOVASCULAR: + chest pain, No orthopnea, edema.  GASTROINTESTINAL: No nausea, vomiting, diarrhea or abdominal pain.  GENITOURINARY: No dysuria, hematuria.  ENDOCRINE: No polyuria, nocturia,  HEMATOLOGY: No anemia, easy bruising or bleeding SKIN: No rash or lesion. MUSCULOSKELETAL: No joint pain or arthritis.   NEUROLOGIC: No tingling, numbness, weakness.  PSYCHIATRY: No anxiety or depression.   MEDICATIONS AT HOME:   Prior to Admission medications   Medication Sig Start Date  End Date Taking? Authorizing Provider  aspirin EC 81 MG tablet Take 81 mg by mouth every morning.   Yes [provider]  atorvastatin (LIPITOR) 80 MG tablet Take 80 mg by mouth daily with supper.   Yes [provider]  Calcium Carbonate (CALCIUM 600 PO) Take 600 mg by mouth daily.   Yes [provider]  fenofibrate 160 MG tablet Take 160 mg by mouth every morning.   Yes [provider]  ferrous sulfate (IRON  SUPPLEMENT) 325 (65 FE) MG tablet Take 325 mg by mouth daily.   Yes [provider]  fluticasone (FLONASE) 50 MCG/ACT nasal spray Place 2 sprays into both nostrils daily.    Yes [provider]  gabapentin (NEURONTIN) 300 MG capsule Take 300 mg by mouth 2 (two) times daily.   Yes [provider]  metoprolol succinate (TOPROL-XL) 25 MG 24 hr tablet Take 25 mg by mouth every evening.    Yes [provider]  pantoprazole (PROTONIX) 40 MG tablet Take 40 mg by mouth daily.    Yes [provider]  sertraline (ZOLOFT) 50 MG tablet Take 50 mg by mouth at bedtime.   Yes [provider]  sennosides-docusate sodium (SENOKOT-S) 8.6-50 MG tablet Take 2 tablets by mouth daily. Patient not taking: Reported on 08/11/2017 09/26/14   Marchia Bond, MD      VITAL SIGNS:  Blood pressure (!) 102/53, pulse 94, temperature (!) 97.5 F (36.4 C), temperature source Oral, resp. rate 20, height 5\' 11"  (1.803 m), weight 101.2 kg (223 lb), SpO2 100 %.  PHYSICAL EXAMINATION:  GENERAL:  72 y.o.-year-old patient lying in the bed with no acute distress.  EYES: Pupils equal, round, reactive to light and accommodation. No scleral icterus. Extraocular muscles intact.  HEENT: Head atraumatic, normocephalic. Oropharynx and nasopharynx clear.  NECK:  Supple, no jugular venous distention. No thyroid enlargement, no tenderness.  LUNGS: Normal breath sounds bilaterally, no wheezing, rales,rhonchi or crepitation. No use of accessory muscles of respiration. Rt chest tube in place (small pigtail to water seal and larger tube to suction) CARDIOVASCULAR: S1, S2 normal. No murmurs, rubs, or gallops. tachycardic ABDOMEN: Soft, nontender, nondistended. Bowel sounds present. No organomegaly or mass.  EXTREMITIES: No pedal edema, cyanosis, or clubbing.  NEUROLOGIC: Cranial nerves II through XII are intact. Muscle strength 5/5 in all extremities. Sensation intact. Gait not checked.    PSYCHIATRIC: The patient is alert and oriented x 3.  SKIN: No obvious rash, lesion, or ulcer.  LABORATORY PANEL:   CBC No results for input(s): WBC, HGB, HCT, PLT in the last 168 hours. ------------------------------------------------------------------------------------------------------------------  Chemistries  Recent Labs  Lab 08/13/17 0416  NA 137  K 4.1  CL 103  CO2 28  GLUCOSE 108*  BUN 27*  CREATININE 1.43*  CALCIUM 9.1   ------------------------------------------------------------------------------------------------------------------  Cardiac Enzymes No results for input(s): TROPONINI in the last 168 hours. ------------------------------------------------------------------------------------------------------------------  RADIOLOGY:  Dg Chest Port 1 View  Result Date: 08/19/2017 CLINICAL DATA:  Right thoracoscopy with apical blebectomy. EXAM: PORTABLE CHEST 1 VIEW COMPARISON:  Radiograph of Aug 18, 2017. FINDINGS: Stable cardiomediastinal silhouette. Left lung is clear. Right-sided large bore chest tube as well as right-sided pigtail drainage catheter are unchanged in position. Minimal right apical pneumothorax is noted. Postsurgical changes are noted in right upper lobe. Bony thorax is unremarkable. IMPRESSION: Stable position of large bore right-sided chest tube as well as right-sided pigtail drainage catheter. Minimal right apical pneumothorax is noted. Electronically Signed   By: Jeneen Rinks  Murlean Caller, M.D.   On: 08/19/2017 07:17   Dg Chest Port 1 View  Result Date: 08/18/2017 CLINICAL DATA:  Status post right chest tube insertion EXAM: PORTABLE CHEST 1 VIEW COMPARISON:  08/16/2017 FINDINGS: Cardiac shadow is stable. Aortic calcifications are again seen. Left lung is clear. Right lung shows a pigtail catheter in place without definitive sizable pneumothorax. A large bore chest tube is also seen in place new from the prior exam. Postsurgical changes in the right apex are  noted. No other focal abnormality is seen. IMPRESSION: Status post right chest tube placement with reduction in right-sided pneumothorax when compared with the prior exam. Electronically Signed   By: Inez Catalina M.D.   On: 08/18/2017 13:50    EKG:   Orders placed or performed during the hospital encounter of 08/11/17  . ED EKG within 10 minutes  . ED EKG within 10 minutes    IMPRESSION AND PLAN:  51 y m seen for tachycardia  * Sinus tachycardia - very well could be due to pain but can't r/o PE, will check d-dimer (if elevated, will get CT Angio) - continue toprol-xl 25 mg daily for rate control, adjust as need  * spontaneous pneumothorax - chest tube in place, further mgmt per surgery  * ARF/hypotension - continue hydration via IVF and monitor  * hyperlipidemia - continue statin    All the records are reviewed and case discussed with Consulting provider. Management plans discussed with the patient, Nursing and they are in agreement.  CODE STATUS: FULL CODE  TOTAL TIME TAKING CARE OF THIS PATIENT: 35 minutes.    Max Sane M.D on 08/19/2017 at 7:42 PM  Between 7am to 6pm - Pager - [phone]  After 6pm go to [email or web address] - Proofreader  [facility]  Office  [phone]  CC: Primary care Physician: Idelle Crouch, MD   Note: This dictation was prepared with Dragon dictation along with smaller phrase technology. Any transcriptional errors that result from this process are unintentional.

## 2017-08-19 NOTE — Care Management Important Message (Signed)
Copy of signed IM left in patient's room.    

## 2017-08-20 ENCOUNTER — Inpatient Hospital Stay: Payer: PPO

## 2017-08-20 LAB — BASIC METABOLIC PANEL
ANION GAP: 9 (ref 5–15)
BUN: 30 mg/dL — ABNORMAL HIGH (ref 6–20)
CO2: 25 mmol/L (ref 22–32)
Calcium: 8.5 mg/dL — ABNORMAL LOW (ref 8.9–10.3)
Chloride: 99 mmol/L — ABNORMAL LOW (ref 101–111)
Creatinine, Ser: 1.72 mg/dL — ABNORMAL HIGH (ref 0.61–1.24)
GFR calc Af Amer: 43 mL/min — ABNORMAL LOW (ref >60)
GFR, EST NON AFRICAN AMERICAN: 37 mL/min — AB (ref >60)
GLUCOSE: 117 mg/dL — AB (ref 65–99)
POTASSIUM: 3.9 mmol/L (ref 3.5–5.1)
Sodium: 133 mmol/L — ABNORMAL LOW (ref 135–145)

## 2017-08-20 LAB — CBC
HEMATOCRIT: 35.3 % — AB (ref 40.0–52.0)
HEMOGLOBIN: 12.4 g/dL — AB (ref 13.0–18.0)
MCH: 32.9 pg (ref 26.0–34.0)
MCHC: 35.3 g/dL (ref 32.0–36.0)
MCV: 93.3 fL (ref 80.0–100.0)
Platelets: 198 10*3/uL (ref 150–440)
RBC: 3.78 MIL/uL — ABNORMAL LOW (ref 4.40–5.90)
RDW: 13.5 % (ref 11.5–14.5)
WBC: 7.9 10*3/uL (ref 3.8–10.6)

## 2017-08-20 LAB — SURGICAL PATHOLOGY

## 2017-08-20 MED ORDER — METOPROLOL TARTRATE 50 MG PO TABS
50.0000 mg | ORAL_TABLET | Freq: Two times a day (BID) | ORAL | Status: DC
  Administered 2017-08-20 – 2017-08-21 (×2): 50 mg via ORAL
  Filled 2017-08-20 (×2): qty 1

## 2017-08-20 MED ORDER — LEVALBUTEROL HCL 1.25 MG/0.5ML IN NEBU
1.2500 mg | INHALATION_SOLUTION | Freq: Three times a day (TID) | RESPIRATORY_TRACT | Status: DC | PRN
  Filled 2017-08-20: qty 0.5

## 2017-08-20 MED ORDER — METOPROLOL TARTRATE 5 MG/5ML IV SOLN
5.0000 mg | INTRAVENOUS | Status: DC | PRN
  Administered 2017-08-20 (×2): 5 mg via INTRAVENOUS
  Filled 2017-08-20 (×2): qty 5

## 2017-08-20 NOTE — Progress Notes (Signed)
At 1457 administered metoprolol IV due to elevated HR for the first time. Pt. is asymptomatic. We will continue to monitor.

## 2017-08-20 NOTE — Progress Notes (Signed)
PT Cancellation Note  Patient Details Name: Gavin Maxwell MRN: [id number] DOB: 1945-06-21   Cancelled Treatment:    Reason Eval/Treat Not Completed: Other (comment).  Per MD note, pt with elevated d-dimer and pt now with orders for B LE [place].  Will hold PT until results of B LE ultrasound are known and pt is appropriate to participate in PT.  Of note, per SW note from today, "Patient has ambulated twice around nurses station this morning and has had only stand by assist from the nurse's aid. Patient utilized a walker.  Patient will need to return home as he is not appropriate for SNF at this time."  Leitha Bleak, PT 08/20/17, 2:14 PM [phone]

## 2017-08-20 NOTE — Progress Notes (Signed)
  Patient ID: BENSON PORCARO, male   DOB: 10/07/1945, 72 y.o.   MRN: [id number]  HISTORY: He states that he slept well last night.  He did have a CT scan done because of his complaints of some chest pain.  This was obtained by the hospitalist.  He did not report any feelings of chest discomfort overnight.   Vitals:   08/20/17 0516 08/20/17 0724  BP: 127/80   Pulse: 74   Resp: (!) 22   Temp: (!) 97.5 F (36.4 C)   SpO2: 100% 99%     EXAM:  I did change all of his wounds.  They are clean dry and intact.  I removed his anterior pigtail catheter since there was no air leak from either tube for the last 2 days.    ASSESSMENT: We will leave the large surgical drain in place for now.  I will obtain a chest x-ray as a baseline today.  I did tell the patient that we would try to remove his chest tube tomorrow with possible discharge tomorrow.   PLAN:   Chest x-ray today.  Possible discharge tomorrow.    Nestor Lewandowsky, MDPatient ID: [name], male   DOB: [birth year], 72 y.o.   MRN: [id number]

## 2017-08-20 NOTE — Progress Notes (Signed)
Ambulated 3 laps around nursing station this am with stand by assist by staff.

## 2017-08-20 NOTE — Clinical Social Work Note (Signed)
CSW noted patient has a PT recommendation for SNF from last evening. Patient has ambulated twice around nurses station this morning and has had only stand by assist from the nurse's aid. Patient utilized a walker. Patient will need to return home as he is not appropriate for SNF at this time.  Shela Leff MSW,LCSW [phone]

## 2017-08-20 NOTE — Progress Notes (Signed)
Patient ID: DEMETRICE COMBES, male   DOB: April 22, 1945, 72 y.o.   MRN: [id number]  [facility] PROGRESS NOTE  OTHEL HOOGENDOORN PIR:[id number] DOB: 08/12/1945 DOA: 08/11/2017 PCP: Idelle Crouch, MD  HPI/Subjective: Patient feeling okay.  Feels a lot better than yesterday.  States one chest tube was taken out already.  Objective: Vitals:   08/20/17 0724 08/20/17 1303  BP:  119/65  Pulse:  89  Resp:    Temp:    SpO2: 99% 97%    Filed Weights   08/18/17 0427 08/18/17 1004 08/20/17 0451  Weight: 101.4 kg (223 lb 8 oz) 101.2 kg (223 lb) 109.1 kg (240 lb 9.6 oz)    ROS: Review of Systems  Constitutional: Negative for chills and fever.  Eyes: Negative for blurred vision.  Respiratory: Positive for shortness of breath. Negative for cough.   Cardiovascular: Negative for chest pain.  Gastrointestinal: Negative for abdominal pain, constipation, diarrhea, nausea and vomiting.  Genitourinary: Negative for dysuria.  Musculoskeletal: Negative for joint pain.  Neurological: Negative for dizziness and headaches.   Exam: Physical Exam  Constitutional: He is oriented to person, place, and time.  HENT:  Nose: No mucosal edema.  Mouth/Throat: No oropharyngeal exudate or posterior oropharyngeal edema.  Eyes: Pupils are equal, round, and reactive to light. Conjunctivae, EOM and lids are normal.  Neck: No JVD present. Carotid bruit is not present. No edema present. No thyroid mass and no thyromegaly present.  Cardiovascular: S1 normal and S2 normal. Exam reveals no gallop.  No murmur heard. Pulses:      Dorsalis pedis pulses are 2+ on the right side, and 2+ on the left side.  Respiratory: No respiratory distress. He has decreased breath sounds in the right lower field. He has no wheezes. He has no rhonchi. He has no rales.  GI: Soft. Bowel sounds are normal. There is no tenderness.  Musculoskeletal:       Right ankle: He exhibits no swelling.       Left ankle: He exhibits no swelling.   Lymphadenopathy:    He has no cervical adenopathy.  Neurological: He is alert and oriented to person, place, and time. No cranial nerve deficit.  Skin: Skin is warm. No rash noted. Nails show no clubbing.  Psychiatric: He has a normal mood and affect.      Data Reviewed: Basic Metabolic Panel: Recent Labs  Lab 08/20/17 0530  NA 133*  K 3.9  CL 99*  CO2 25  GLUCOSE 117*  BUN 30*  CREATININE 1.72*  CALCIUM 8.5*   CBC: Recent Labs  Lab 08/20/17 0530  WBC 7.9  HGB 12.4*  HCT 35.3*  MCV 93.3  PLT 198     Recent Results (from the past 240 hour(s))  MRSA PCR Screening     Status: None   Collection Time: 08/17/17  9:35 PM  Result Value Ref Range Status   MRSA by PCR NEGATIVE NEGATIVE Final    Comment:        The GeneXpert MRSA Assay (FDA approved for NASAL specimens only), is one component of a comprehensive MRSA colonization surveillance program. It is not intended to diagnose MRSA infection nor to guide or monitor treatment for MRSA infections. Performed at [facility], [address] [place], [place] [address]      Studies: Dg Chest 2 View  Result Date: 08/20/2017 CLINICAL DATA:  Postop check.  Spontaneous pneumothorax EXAM: CHEST - 2 VIEW COMPARISON:  Chest CT from yesterday FINDINGS: Trace right  apical pneumothorax. A right chest tube is in place. Postoperative right apex. The left lung is comparatively clear. Normal heart size and stable mediastinal contours. Postoperative left shoulder with similar alignment to 2015. IMPRESSION: 1. Trace right apical pneumothorax. 2. Low lung volumes with right atelectasis. Electronically Signed   By: Monte Fantasia M.D.   On: 08/20/2017 09:26   Ct Angio Chest Pe W Or Wo Contrast  Result Date: 08/19/2017 CLINICAL DATA:  Spontaneous pneumothorax. Intermittent tachycardia. Elevated D-dimer. Severe chest discomfort. EXAM: CT ANGIOGRAPHY CHEST WITH CONTRAST TECHNIQUE: Multidetector CT imaging of the chest was  performed using the standard protocol during bolus administration of intravenous contrast. Multiplanar CT image reconstructions and MIPs were obtained to evaluate the vascular anatomy. CONTRAST:  92mL ISOVUE-370 IOPAMIDOL (ISOVUE-370) INJECTION 76% COMPARISON:  Chest x-ray from earlier same day. Chest CT dated 07/01/2016. FINDINGS: Cardiovascular: There is no pulmonary embolism identified within the main, lobar or segmental pulmonary arteries bilaterally. No thoracic aortic aneurysm or evidence of aortic dissection. Scattered aortic atherosclerosis. Heart size is upper normal. No pericardial effusion. Diffuse coronary artery calcifications. Mediastinum/Nodes: No mass or enlarged lymph nodes seen within the mediastinum or perihilar regions. Esophagus appears normal. Trachea and central bronchi are unremarkable. Lungs/Pleura: RIGHT-sided chest tube in place with tip directed towards the anterior aspects of the RIGHT upper lobe. Trace residual pneumothorax at the RIGHT lung apex. Focal consolidation at the RIGHT lung apex, measuring approximately 4.3 by 1.7 cm, most likely contusion and/or atelectasis. Small RIGHT pleural effusion. Patchy mild atelectasis within the RIGHT lung. Minimal atelectasis at the LEFT lung base. Upper Abdomen: No acute findings. Musculoskeletal: No acute or suspicious osseous finding. Mild degenerative spondylosis throughout the slightly scoliotic thoracic spine. Patchy mild subcutaneous emphysema within the upper RIGHT chest. Probable bilateral gynecomastia. Review of the MIP images confirms the above findings. IMPRESSION: 1. No pulmonary embolism. 2. RIGHT-sided chest tube in place with tip directed towards the RIGHT lung apex. Trace residual pneumothorax at the RIGHT lung apex. Associated mild subcutaneous emphysema within the upper RIGHT chest wall. 3. Focal dense consolidation at the RIGHT lung apex, measuring approximately 4 cm greatest dimension, most likely contusion and/or atelectasis.  4. Small RIGHT pleural effusion.  Mild bibasilar atelectasis. 5. Diffuse coronary artery calcifications. Recommend correlation with any possible associated cardiac symptoms. Heart size is upper normal. Aortic Atherosclerosis (ICD10-I70.0). Electronically Signed   By: Franki Cabot M.D.   On: 08/19/2017 20:53   Dg Chest Port 1 View  Result Date: 08/19/2017 CLINICAL DATA:  Right thoracoscopy with apical blebectomy. EXAM: PORTABLE CHEST 1 VIEW COMPARISON:  Radiograph of Aug 18, 2017. FINDINGS: Stable cardiomediastinal silhouette. Left lung is clear. Right-sided large bore chest tube as well as right-sided pigtail drainage catheter are unchanged in position. Minimal right apical pneumothorax is noted. Postsurgical changes are noted in right upper lobe. Bony thorax is unremarkable. IMPRESSION: Stable position of large bore right-sided chest tube as well as right-sided pigtail drainage catheter. Minimal right apical pneumothorax is noted. Electronically Signed   By: Marijo Conception, M.D.   On: 08/19/2017 07:17   Dg Chest Port 1 View  Result Date: 08/18/2017 CLINICAL DATA:  Status post right chest tube insertion EXAM: PORTABLE CHEST 1 VIEW COMPARISON:  08/16/2017 FINDINGS: Cardiac shadow is stable. Aortic calcifications are again seen. Left lung is clear. Right lung shows a pigtail catheter in place without definitive sizable pneumothorax. A large bore chest tube is also seen in place new from the prior exam. Postsurgical changes in the right apex  are noted. No other focal abnormality is seen. IMPRESSION: Status post right chest tube placement with reduction in right-sided pneumothorax when compared with the prior exam. Electronically Signed   By: Inez Catalina M.D.   On: 08/18/2017 13:50    Scheduled Meds: . albuterol  2.5 mg Nebulization TID  . atorvastatin  80 mg Oral Q supper  . cyclobenzaprine  5 mg Oral TID  . fenofibrate  160 mg Oral q morning - 10a  . ferrous sulfate  325 mg Oral Daily  .  fluticasone  2 spray Each Nare Daily  . gabapentin  400 mg Oral TID  . metoprolol succinate  25 mg Oral QPM  . pantoprazole  40 mg Oral Daily  . senna-docusate  2 tablet Oral Daily  . sertraline  50 mg Oral QHS   Continuous Infusions:  Assessment/Plan:  1. Sinus tachycardia improved with metoprolol.  CT scan of the chest was negative for pulmonary embolism. 2. Elevated d-dimer.  CT scan of the chest negative for pulmonary embolism we will get an ultrasound of the lower extremities. 3. Spontaneous pneumothorax.  Continue chest tube management as per surgery.  Should be also able to get off oxygen. 4. Acute kidney injury on chronic kidney disease stage III.  Check creatinine tomorrow morning. 5. Hyperlipidemia unspecified on atorvastatin. 6. Depression on Zoloft 7. Neuropathy on gabapentin 8. Weakness.  Initial physical therapy evaluation recommended rehab.  Patient walked around the nursing station today as per social workers note.  Hopefully patient can go home with home health once the chest tube is out.  Code Status:     Code Status Orders  (From admission, onward)        Start     Ordered   08/11/17 2110  Full code  Continuous     08/11/17 2110    Code Status History    Date Active Date Inactive Code Status Order ID Comments User Context   09/26/2014 1157 09/28/2014 1524 Full Code [id number]  Marchia Bond, MD Inpatient   10/04/2013 1520 10/05/2013 1650 Full Code [id number]  Johnny Bridge, MD Inpatient    Advance Directive Documentation     Most Recent Value  Type of Advance Directive  Healthcare Power of Attorney  Pre-existing out of facility DNR order (yellow form or pink MOST form)  -  "MOST" Form in Place?  -     Disposition Plan: Home with home health once chest tube is out  Consultants:  Cardiothoracic surgery  Procedures:  Chest tube placement  Time spent: 28 minutes  [place]

## 2017-08-20 NOTE — Progress Notes (Signed)
At 17:10 administered metoprolol IV the second time due to elevated HR of 150;s ,BP was 110/80 while pt was in Ultrasound.

## 2017-08-20 NOTE — Progress Notes (Signed)
Pt. HR is elevated again staring at 160's at while pt. is at Ultrasound. MD aware.

## 2017-08-21 ENCOUNTER — Inpatient Hospital Stay: Payer: PPO

## 2017-08-21 LAB — BASIC METABOLIC PANEL
Anion gap: 8 (ref 5–15)
BUN: 32 mg/dL — AB (ref 6–20)
CHLORIDE: 101 mmol/L (ref 101–111)
CO2: 25 mmol/L (ref 22–32)
Calcium: 8.8 mg/dL — ABNORMAL LOW (ref 8.9–10.3)
Creatinine, Ser: 1.51 mg/dL — ABNORMAL HIGH (ref 0.61–1.24)
GFR calc Af Amer: 51 mL/min — ABNORMAL LOW (ref >60)
GFR calc non Af Amer: 44 mL/min — ABNORMAL LOW (ref >60)
Glucose, Bld: 112 mg/dL — ABNORMAL HIGH (ref 65–99)
POTASSIUM: 4.2 mmol/L (ref 3.5–5.1)
Sodium: 134 mmol/L — ABNORMAL LOW (ref 135–145)

## 2017-08-21 MED ORDER — ALBUTEROL SULFATE HFA 108 (90 BASE) MCG/ACT IN AERS: 2.0000 | INHALATION_SPRAY | Freq: Four times a day (QID) | RESPIRATORY_TRACT | 0 refills | Status: DC | PRN

## 2017-08-21 MED ORDER — HYDROCODONE-ACETAMINOPHEN 5-325 MG PO TABS: 1.0000 | ORAL_TABLET | Freq: Four times a day (QID) | ORAL | 0 refills | Status: DC | PRN

## 2017-08-21 MED ORDER — METOPROLOL TARTRATE 25 MG PO TABS: 25.0000 mg | ORAL_TABLET | Freq: Two times a day (BID) | ORAL | 0 refills | Status: DC

## 2017-08-21 MED ORDER — METOPROLOL TARTRATE 25 MG PO TABS: 25.0000 mg | ORAL_TABLET | Freq: Two times a day (BID) | ORAL | Status: DC

## 2017-08-21 MED ORDER — POLYETHYLENE GLYCOL 3350 17 G PO PACK: 17.0000 g | PACK | Freq: Every day | ORAL | 0 refills | Status: DC | PRN

## 2017-08-21 NOTE — Progress Notes (Signed)
Physical Therapy Treatment Patient Details Name: Gavin Maxwell MRN: [id number] DOB: 1946/03/30 Today's Date: 08/21/2017    History of Present Illness 72 yo male with onset of sepsis from PNA with spontaneous pneumothorax with chest tubes and tachycardia was admitted and now referred to PT.  Has had persistent air leak since chest tubes inserted.  Chest tube since removed. 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 Raden made excellent progress with mobility, ambulating 400 ft with supervision and use of SPC with HR remaining stable throughout.  HR high 80s at rest and up to 100 when ambulating. SpO2 remains in the mid 90s throughout majority of ambulation; however SpO2 does drop to 87% briefly, recovering quickly within seconds with standing rest break. Updated follow up recommendations to home with HHPT as pt's mobility status has improved significantly since evaluation.    Follow Up Recommendations  Home health PT     Equipment Recommendations  Rolling walker with 5" wheels    Recommendations for Other Services       Precautions / Restrictions Precautions Precautions: Fall;Other (comment) Precaution Comments: telemetry, s/p chest tube, monitor O2 and SpO2 Restrictions Weight Bearing Restrictions: No    Mobility  Bed Mobility Overal bed mobility: Needs Assistance;Modified Independent Bed Mobility: Supine to Sit     Supine to sit: Supervision;HOB elevated     General bed mobility comments: Increased time but no physical assist or cues needed  Transfers Overall transfer level: Needs assistance Equipment used: None Transfers: Sit to/from Stand Sit to Stand: Supervision         General transfer comment: No instability noted.  Proper technique and safety demonstration.   Ambulation/Gait Ambulation/Gait assistance: Supervision Ambulation Distance (Feet): 400 Feet Assistive  device: Straight cane Gait Pattern/deviations: Decreased step length - right;Decreased step length - left Gait velocity: slightly decreased   General Gait Details: Slightly decreased gait speed and use of SPC for stability.  No signs of unsteadiness.  SpO2 remains in the mid 90s throughout majority of ambulation; however SpO2 does drop to 87% briefly, recovering quickly within seconds with standing rest break.  HR high 80s at rest and up to 100 whem ambulating.    Stairs             Wheelchair Mobility    Modified Rankin (Stroke Patients Only)       Balance Overall balance assessment: Needs assistance Sitting-balance support: Feet supported;No upper extremity supported Sitting balance-Leahy Scale: Good     Standing balance support: During functional activity;No upper extremity supported Standing balance-Leahy Scale: Fair Standing balance comment: Pt able to stand statically without UE support but does prefer to use SPC when ambulating                            Cognition Arousal/Alertness: Awake/alert Behavior During Therapy: WFL for tasks assessed/performed Overall Cognitive Status: Within Functional Limits for tasks assessed                                        Exercises Other Exercises Other Exercises: Encouraged pt to ambulate again with nursing staff during the day    General Comments        Pertinent Vitals/Pain Pain Assessment: No/denies pain    Home Living  Prior Function            PT Goals (current goals can now be found in the care plan section) Acute Rehab PT Goals Patient Stated Goal: to walk and go home PT Goal Formulation: With patient Time For Goal Achievement: 09/02/17 Potential to Achieve Goals: Good Progress towards PT goals: Progressing toward goals    Frequency    Min 2X/week      PT Plan Discharge plan needs to be updated    Co-evaluation               AM-PAC PT "6 Clicks" Daily Activity  Outcome Measure  Difficulty turning over in bed (including adjusting bedclothes, sheets and blankets)?: A Little Difficulty moving from lying on back to sitting on the side of the bed? : A Little Difficulty sitting down on and standing up from a chair with arms (e.g., wheelchair, bedside commode, etc,.)?: A Little Help needed moving to and from a bed to chair (including a wheelchair)?: A Little Help needed walking in hospital room?: A Little Help needed climbing 3-5 steps with a railing? : A Little 6 Click Score: 18    End of Session Equipment Utilized During Treatment: Gait belt Activity Tolerance: Patient tolerated treatment well Patient left: with call bell/phone within reach;with bed alarm set;in bed;Other (comment)(sitting EOB to eat lunch) Nurse Communication: Mobility status;Other (comment)(HR, SpO2) PT Visit Diagnosis: Unsteadiness on feet (R26.81);Muscle weakness (generalized) (M62.81);Difficulty in walking, not elsewhere classified (R26.2);Adult, failure to thrive (R62.7)     Time: 1212-1230 PT Time Calculation (min) (ACUTE ONLY): 18 min  Charges:  $Gait Training: 8-22 mins                    G Codes:       Collie Siad PT, DPT 08/21/2017, 1:34 PM

## 2017-08-21 NOTE — Progress Notes (Signed)
Patient ID: Gavin Maxwell, male   DOB: 03-Jun-1945, 72 y.o.   MRN: [id number]  [facility] PROGRESS NOTE  TOBIAH Maxwell TIW:[id number] DOB: 1945-04-29 DOA: 08/11/2017 PCP: Idelle Crouch, MD  HPI/Subjective:   Objective: Vitals:   08/20/17 2308 08/21/17 0440  BP: 111/65 108/66  Pulse: 79 77  Resp: 18 20  Temp:  98.4 F (36.9 C)  SpO2: 99% 97%    Filed Weights   08/18/17 1004 08/20/17 0451 08/21/17 0500  Weight: 101.2 kg (223 lb) 109.1 kg (240 lb 9.6 oz) 107.9 kg (237 lb 12.8 oz)    ROS: Review of Systems  Constitutional: Negative for chills and fever.  Eyes: Negative for blurred vision.  Respiratory: Positive for shortness of breath. Negative for cough.   Cardiovascular: Negative for chest pain.  Gastrointestinal: Negative for abdominal pain, constipation, diarrhea, nausea and vomiting.  Genitourinary: Negative for dysuria.  Musculoskeletal: Negative for joint pain.  Neurological: Negative for dizziness and headaches.   Exam: Physical Exam  Constitutional: He is oriented to person, place, and time.  HENT:  Nose: No mucosal edema.  Mouth/Throat: No oropharyngeal exudate or posterior oropharyngeal edema.  Eyes: Pupils are equal, round, and reactive to light. Conjunctivae, EOM and lids are normal.  Neck: No JVD present. Carotid bruit is not present. No edema present. No thyroid mass and no thyromegaly present.  Cardiovascular: S1 normal and S2 normal. Exam reveals no gallop.  No murmur heard. Pulses:      Dorsalis pedis pulses are 2+ on the right side, and 2+ on the left side.  Respiratory: No respiratory distress. He has decreased breath sounds in the right lower field. He has no wheezes. He has no rhonchi. He has no rales.  GI: Soft. Bowel sounds are normal. There is no tenderness.  Musculoskeletal:       Right ankle: He exhibits no swelling.       Left ankle: He exhibits no swelling.  Lymphadenopathy:    He has no cervical adenopathy.  Neurological: He is  alert and oriented to person, place, and time. No cranial nerve deficit.  Skin: Skin is warm. No rash noted. Nails show no clubbing.  Psychiatric: He has a normal mood and affect.      Data Reviewed: Basic Metabolic Panel: Recent Labs  Lab 08/20/17 0530 08/21/17 0549  NA 133* 134*  K 3.9 4.2  CL 99* 101  CO2 25 25  GLUCOSE 117* 112*  BUN 30* 32*  CREATININE 1.72* 1.51*  CALCIUM 8.5* 8.8*   CBC: Recent Labs  Lab 08/20/17 0530  WBC 7.9  HGB 12.4*  HCT 35.3*  MCV 93.3  PLT 198     Recent Results (from the past 240 hour(s))  MRSA PCR Screening     Status: None   Collection Time: 08/17/17  9:35 PM  Result Value Ref Range Status   MRSA by PCR NEGATIVE NEGATIVE Final    Comment:        The GeneXpert MRSA Assay (FDA approved for NASAL specimens only), is one component of a comprehensive MRSA colonization surveillance program. It is not intended to diagnose MRSA infection nor to guide or monitor treatment for MRSA infections. Performed at [facility], [address], [place], [place] [address]      Studies: Dg Chest 2 View  Result Date: 08/20/2017 CLINICAL DATA:  Postop check.  Spontaneous pneumothorax EXAM: CHEST - 2 VIEW COMPARISON:  Chest CT from yesterday FINDINGS: Trace right apical pneumothorax. A right chest  tube is in place. Postoperative right apex. The left lung is comparatively clear. Normal heart size and stable mediastinal contours. Postoperative left shoulder with similar alignment to 2015. IMPRESSION: 1. Trace right apical pneumothorax. 2. Low lung volumes with right atelectasis. Electronically Signed   By: Monte Fantasia M.D.   On: 08/20/2017 09:26   Ct Angio Chest Pe W Or Wo Contrast  Result Date: 08/19/2017 CLINICAL DATA:  Spontaneous pneumothorax. Intermittent tachycardia. Elevated D-dimer. Severe chest discomfort. EXAM: CT ANGIOGRAPHY CHEST WITH CONTRAST TECHNIQUE: Multidetector CT imaging of the chest was performed using the standard  protocol during bolus administration of intravenous contrast. Multiplanar CT image reconstructions and MIPs were obtained to evaluate the vascular anatomy. CONTRAST:  39mL ISOVUE-370 IOPAMIDOL (ISOVUE-370) INJECTION 76% COMPARISON:  Chest x-ray from earlier same day. Chest CT dated 07/01/2016. FINDINGS: Cardiovascular: There is no pulmonary embolism identified within the main, lobar or segmental pulmonary arteries bilaterally. No thoracic aortic aneurysm or evidence of aortic dissection. Scattered aortic atherosclerosis. Heart size is upper normal. No pericardial effusion. Diffuse coronary artery calcifications. Mediastinum/Nodes: No mass or enlarged lymph nodes seen within the mediastinum or perihilar regions. Esophagus appears normal. Trachea and central bronchi are unremarkable. Lungs/Pleura: RIGHT-sided chest tube in place with tip directed towards the anterior aspects of the RIGHT upper lobe. Trace residual pneumothorax at the RIGHT lung apex. Focal consolidation at the RIGHT lung apex, measuring approximately 4.3 by 1.7 cm, most likely contusion and/or atelectasis. Small RIGHT pleural effusion. Patchy mild atelectasis within the RIGHT lung. Minimal atelectasis at the LEFT lung base. Upper Abdomen: No acute findings. Musculoskeletal: No acute or suspicious osseous finding. Mild degenerative spondylosis throughout the slightly scoliotic thoracic spine. Patchy mild subcutaneous emphysema within the upper RIGHT chest. Probable bilateral gynecomastia. Review of the MIP images confirms the above findings. IMPRESSION: 1. No pulmonary embolism. 2. RIGHT-sided chest tube in place with tip directed towards the RIGHT lung apex. Trace residual pneumothorax at the RIGHT lung apex. Associated mild subcutaneous emphysema within the upper RIGHT chest wall. 3. Focal dense consolidation at the RIGHT lung apex, measuring approximately 4 cm greatest dimension, most likely contusion and/or atelectasis. 4. Small RIGHT pleural  effusion.  Mild bibasilar atelectasis. 5. Diffuse coronary artery calcifications. Recommend correlation with any possible associated cardiac symptoms. Heart size is upper normal. Aortic Atherosclerosis (ICD10-I70.0). Electronically Signed   By: Franki Cabot M.D.   On: 08/19/2017 20:53   US Venous Img Lower Bilateral  Result Date: 08/21/2017 CLINICAL DATA:  Elevated D-dimer.  History of lymphoma. EXAM: BILATERAL LOWER EXTREMITY VENOUS DOPPLER ULTRASOUND TECHNIQUE: Gray-scale sonography with graded compression, as well as color Doppler and duplex ultrasound were performed to evaluate the lower extremity deep venous systems from the level of the common femoral vein and including the common femoral, femoral, profunda femoral, popliteal and calf veins including the posterior tibial, peroneal and gastrocnemius veins when visible. The superficial great saphenous vein was also interrogated. Spectral Doppler was utilized to evaluate flow at rest and with distal augmentation maneuvers in the common femoral, femoral and popliteal veins. COMPARISON:  None. FINDINGS: RIGHT LOWER EXTREMITY Common Femoral Vein: No evidence of thrombus. Normal compressibility, respiratory phasicity and response to augmentation. Saphenofemoral Junction: No evidence of thrombus. Normal compressibility and flow on color Doppler imaging. Profunda Femoral Vein: No evidence of thrombus. Normal compressibility and flow on color Doppler imaging. Femoral Vein: No evidence of thrombus. Normal compressibility, respiratory phasicity and response to augmentation. Popliteal Vein: No evidence of thrombus. Normal compressibility, respiratory phasicity and response to augmentation.  Calf Veins: No evidence of thrombus. Normal compressibility and flow on color Doppler imaging. Superficial Great Saphenous Vein: No evidence of thrombus. Normal compressibility. Venous Reflux:  None. Other Findings: No evidence of superficial thrombophlebitis or abnormal fluid  collection. LEFT LOWER EXTREMITY Common Femoral Vein: No evidence of thrombus. Normal compressibility, respiratory phasicity and response to augmentation. Saphenofemoral Junction: No evidence of thrombus. Normal compressibility and flow on color Doppler imaging. Profunda Femoral Vein: No evidence of thrombus. Normal compressibility and flow on color Doppler imaging. Femoral Vein: No evidence of thrombus. Normal compressibility, respiratory phasicity and response to augmentation. Popliteal Vein: No evidence of thrombus. Normal compressibility, respiratory phasicity and response to augmentation. Calf Veins: No evidence of thrombus. Normal compressibility and flow on color Doppler imaging. Superficial Great Saphenous Vein: No evidence of thrombus. Normal compressibility. Venous Reflux:  None. Other Findings: No evidence of superficial thrombophlebitis or abnormal fluid collection. Relatively pulsatile deep venous flow in both lower extremities may reflect underlying cardiac abnormality such as valvular disease. Correlation suggested clinically. IMPRESSION: 1. No evidence of bilateral lower extremity deep venous thrombosis. 2. Relatively pulsatile venous flow in the deep veins of both lower extremities. This is nonspecific but may reflect some type of underlying cardiac abnormality such as valvular disease. Electronically Signed   By: Aletta Edouard M.D.   On: 08/21/2017 07:57    Scheduled Meds: . atorvastatin  80 mg Oral Q supper  . cyclobenzaprine  5 mg Oral TID  . fenofibrate  160 mg Oral q morning - 10a  . ferrous sulfate  325 mg Oral Daily  . fluticasone  2 spray Each Nare Daily  . gabapentin  400 mg Oral TID  . metoprolol tartrate  50 mg Oral BID  . pantoprazole  40 mg Oral Daily  . senna-docusate  2 tablet Oral Daily  . sertraline  50 mg Oral QHS   Continuous Infusions:  Assessment/Plan:  1. SVT needed a few doses of iv metoprolol last night.  On higher dose metoprolol now.  Potentially can  cut back to 25mg  po bid upon discharge depending on patient blood pressure.  CT scan of the chest was negative for pulmonary embolism.  Recent echo reviewed 2. Elevated d-dimer.  CT scan of the chest negative for pulmonary embolism we will get an ultrasound of the lower extremities negative for dvt 3. Spontaneous pneumothorax.  Continue chest tube management as per surgery.  Pulse ox room air 4. Acute kidney injury on chronic kidney disease stage III.   5. Hyperlipidemia unspecified on atorvastatin. 6. Depression on Zoloft 7. Neuropathy on gabapentin 8. Weakness.  Ambulate today  Code Status:     Code Status Orders  (From admission, onward)        Start     Ordered   08/11/17 2110  Full code  Continuous     08/11/17 2110    Code Status History    Date Active Date Inactive Code Status Order ID Comments User Context   09/26/2014 1157 09/28/2014 1524 Full Code [id number]  Marchia Bond, MD Inpatient   10/04/2013 1520 10/05/2013 1650 Full Code [id number]  Johnny Bridge, MD Inpatient    Advance Directive Documentation     Most Recent Value  Type of Advance Directive  Healthcare Power of Attorney  Pre-existing out of facility DNR order (yellow form or pink MOST form)  -  "MOST" Form in Place?  -     Disposition Plan: Home with home health once chest tube is out  Consultants:  Cardiothoracic surgery  Procedures:  Chest tube placement  Time spent: 28 minutes.  Case discussed with DR [facility]

## 2017-08-21 NOTE — Care Management Important Message (Signed)
Important Message  Patient Details  Name: Gavin Maxwell MRN: [id number] Date of Birth: Nov 18, 1945   Medicare Important Message Given:  Yes    Juliann Pulse A Annice Jolly 08/21/2017, 11:01 AM

## 2017-08-21 NOTE — Progress Notes (Signed)
Nutrition Brief Note  Patient identified for LOS day 59  72 y.o. male with a known history of hypertension, non-Hodgkin's lymphoma, past tobacco use admitted with shortness of breath and chest pain. Noted to have spontaneous pneumothorax requiring chest tube.   Wt Readings from Last 15 Encounters:  08/21/17 237 lb 12.8 oz (107.9 kg)  09/28/14 236 lb 15.9 oz (107.5 kg)  09/14/14 237 lb 14 oz (107.9 kg)  10/04/13 249 lb (112.9 kg)  09/26/13 249 lb (112.9 kg)    Body mass index is 33.17 kg/m. Patient meets criteria for obesity based on current BMI.   Current diet order is regular, patient is consuming approximately 100% of meals at this time. Labs and medications reviewed.   No nutrition interventions warranted at this time. If nutrition issues arise, please consult RD.   Koleen Distance MS, RD, LDN Pager #- [phone] Office#- [phone] After Hours Pager: [phone]

## 2017-08-21 NOTE — Care Management Note (Signed)
Case Management Note  Patient Details  Name: Gavin Maxwell MRN: [id number] Date of Birth: Oct 20, 1945   Patient chest tube has been removed.  Patient lives at home with wife.  PCP [place].  Patient states that he has a cane in the home.  PT has assessed patient and recommends home health PT.  Patient declines home health services at this time.  RNCM informed patient that should he change his mind prior to discharge let RNCM know, or his PCP could arrange after discharge. RNCM signing off.   Subjective/Objective:                    Action/Plan:   Expected Discharge Date:                  Expected Discharge Plan:     In-House Referral:     Discharge planning Services     Post Acute Care Choice:    Choice offered to:     DME Arranged:    DME Agency:     HH Arranged:    HH Agency:     Status of Service:     If discussed at [employer] of Stay Meetings, dates discussed:    Additional Comments:  Beverly Sessions, RN 08/21/2017, 3:11 PM

## 2017-08-24 ENCOUNTER — Other Ambulatory Visit: Payer: Self-pay

## 2017-08-24 DIAGNOSIS — E785 Hyperlipidemia, unspecified: Secondary | ICD-10-CM | POA: Insufficient documentation

## 2017-08-24 DIAGNOSIS — C8590 Non-Hodgkin lymphoma, unspecified, unspecified site: Secondary | ICD-10-CM | POA: Insufficient documentation

## 2017-08-24 DIAGNOSIS — J9311 Primary spontaneous pneumothorax: Secondary | ICD-10-CM

## 2017-08-24 DIAGNOSIS — K219 Gastro-esophageal reflux disease without esophagitis: Secondary | ICD-10-CM | POA: Insufficient documentation

## 2017-08-24 NOTE — Progress Notes (Signed)
[id number]  

## 2017-08-28 ENCOUNTER — Ambulatory Visit
Admission: RE | Admit: 2017-08-28 | Discharge: 2017-08-28 | Disposition: A | Discharge status: Home / Self Care | Payer: PPO | Source: Ambulatory Visit | Attending: Cardiothoracic Surgery | Admitting: Cardiothoracic Surgery

## 2017-08-28 ENCOUNTER — Ambulatory Visit (INDEPENDENT_AMBULATORY_CARE_PROVIDER_SITE_OTHER): Payer: PPO | Admitting: Cardiothoracic Surgery

## 2017-08-28 ENCOUNTER — Encounter: Payer: Self-pay | Admitting: Cardiothoracic Surgery

## 2017-08-28 VITALS — BP 130/79 | HR 76 | Temp 98.3°F | Ht 71.0 in | Wt 232.0 lb

## 2017-08-28 DIAGNOSIS — J9383 Other pneumothorax: Secondary | ICD-10-CM | POA: Diagnosis not present

## 2017-08-28 DIAGNOSIS — C8590 Non-Hodgkin lymphoma, unspecified, unspecified site: Secondary | ICD-10-CM | POA: Diagnosis not present

## 2017-08-28 DIAGNOSIS — Z4682 Encounter for fitting and adjustment of non-vascular catheter: Secondary | ICD-10-CM | POA: Insufficient documentation

## 2017-08-28 DIAGNOSIS — N183 Chronic kidney disease, stage 3 (moderate): Secondary | ICD-10-CM | POA: Diagnosis not present

## 2017-08-28 DIAGNOSIS — J431 Panlobular emphysema: Secondary | ICD-10-CM | POA: Diagnosis not present

## 2017-08-28 DIAGNOSIS — J9311 Primary spontaneous pneumothorax: Secondary | ICD-10-CM

## 2017-08-28 DIAGNOSIS — D649 Anemia, unspecified: Secondary | ICD-10-CM | POA: Diagnosis not present

## 2017-08-28 DIAGNOSIS — Z79899 Other long term (current) drug therapy: Secondary | ICD-10-CM | POA: Diagnosis not present

## 2017-08-28 DIAGNOSIS — R05 Cough: Secondary | ICD-10-CM | POA: Diagnosis not present

## 2017-08-28 DIAGNOSIS — J90 Pleural effusion, not elsewhere classified: Secondary | ICD-10-CM | POA: Diagnosis not present

## 2017-08-28 NOTE — Patient Instructions (Signed)
Please give us a call in case you have any questions or concerns.  

## 2017-08-28 NOTE — Progress Notes (Signed)
He comes in today without any specific problems.  He states that his breathing's been okay.  He has no new complaints.  His lungs show diminished breath sounds at the right base.  All of his wounds are clean dry and intact.  His heart is regular.  We did get a chest x-ray today.  I have independently reviewed that.  I see no signs of pleural effusion or pneumothorax.  We removed his sutures and Steri-Stripped his wound.  I did not make a return visit for him but would be happy to see him should the need arise.

## 2017-09-04 DIAGNOSIS — G4733 Obstructive sleep apnea (adult) (pediatric): Secondary | ICD-10-CM | POA: Diagnosis not present

## 2017-09-04 NOTE — Discharge Summary (Signed)
Physician Discharge Summary  Patient ID: Gavin Maxwell MRN: [id number] DOB/AGE: [birth year] 72 y.o.  Admit date: 08/11/2017 Discharge date: 09/04/2017   Discharge Diagnoses:  Active Problems:   Pneumothorax on right   Primary spontaneous pneumothorax   Procedures: Chest tube insertion.  Right thoracoscopy with apical blebectomy and talc pleurodesis  Hospital Course: This patient experienced an acute episode of shortness of breath and presented to the emergency room where he had a right-sided spontaneous pneumothorax.  The patient was treated with a chest tube which promptly reexpanded his lung.  He had a CT scan of the chest revealing apical blebs and because he had a persistent air leak he was offered a thoracoscopy with blebectomy and talc pleurodesis for management of his pneumothorax.  This was accomplished.  His postoperative course was complicated by intermittent episodes of SVT treated with beta-blockers.  After several days the chest tube was removed and he remained in sinus rhythm and he was discharged home to follow-up in 1 week.  Disposition:    Allergies as of 08/21/2017      Reactions   Augmentin [amoxicillin-pot Clavulanate] Nausea And Vomiting   Celebrex [celecoxib] Nausea And Vomiting   Oxycodone Other (See Comments)   Confusion, makes him "crazy"      Medication List    STOP taking these medications   metoprolol succinate 25 MG 24 hr tablet Commonly known as:  TOPROL-XL   sennosides-docusate sodium 8.6-50 MG tablet Commonly known as:  SENOKOT-S     TAKE these medications   albuterol 108 (90 Base) MCG/ACT inhaler Commonly known as:  PROVENTIL HFA;VENTOLIN HFA Inhale 2 puffs into the lungs every 6 (six) hours as needed for wheezing or shortness of breath.   aspirin EC 81 MG tablet Take 81 mg by mouth every morning.   atorvastatin 80 MG tablet Commonly known as:  LIPITOR Take 80 mg by mouth daily with supper.   CALCIUM 600 PO Take 600 mg by mouth  daily.   fenofibrate 160 MG tablet Take 160 mg by mouth every morning.   fluticasone 50 MCG/ACT nasal spray Commonly known as:  FLONASE Place 2 sprays into both nostrils daily.   gabapentin 300 MG capsule Commonly known as:  NEURONTIN Take 300 mg by mouth 2 (two) times daily.   HYDROcodone-acetaminophen 5-325 MG tablet Commonly known as:  NORCO/VICODIN Take 1 tablet by mouth every 6 (six) hours as needed for moderate pain.   IRON SUPPLEMENT 325 (65 FE) MG tablet Generic drug:  ferrous sulfate Take 325 mg by mouth daily.   metoprolol tartrate 25 MG tablet Commonly known as:  LOPRESSOR Take 1 tablet (25 mg total) by mouth 2 (two) times daily.   pantoprazole 40 MG tablet Commonly known as:  PROTONIX Take 40 mg by mouth daily.   polyethylene glycol packet Commonly known as:  MIRALAX / GLYCOLAX Take 17 g by mouth daily as needed for mild constipation.   sertraline 50 MG tablet Commonly known as:  ZOLOFT Take 50 mg by mouth at bedtime.      Follow-up Information    Idelle Crouch, MD In 6 days.   Specialty:  Internal Medicine Why:  Office is closed you will have to call to make your follow up. Contact information: [facility] [place] [address] [phone]        Nestor Lewandowsky, MD In 1 week.   Specialties:  Cardiothoracic Surgery, General Surgery Why:  Office is closed you will have to call to  make your follow up. Contact information: [place] [address] [phone]           Nestor Lewandowsky, MD

## 2017-09-07 ENCOUNTER — Ambulatory Visit (INDEPENDENT_AMBULATORY_CARE_PROVIDER_SITE_OTHER): Payer: PPO | Admitting: Pulmonary Disease

## 2017-09-07 ENCOUNTER — Encounter: Payer: Self-pay | Admitting: Pulmonary Disease

## 2017-09-07 VITALS — BP 132/74 | HR 74 | Ht 71.0 in | Wt 238.0 lb

## 2017-09-07 DIAGNOSIS — Z87891 Personal history of nicotine dependence: Secondary | ICD-10-CM

## 2017-09-07 DIAGNOSIS — R05 Cough: Secondary | ICD-10-CM

## 2017-09-07 DIAGNOSIS — J9383 Other pneumothorax: Secondary | ICD-10-CM | POA: Diagnosis not present

## 2017-09-07 DIAGNOSIS — R053 Chronic cough: Secondary | ICD-10-CM

## 2017-09-07 NOTE — Patient Instructions (Signed)
You may discontinue Singulair (pill prescribed by Dr. Doy Hutching) Continue Advair inhaler-2 inhalations twice a day.  Rinse mouth after use Lung function tests (PFTs) ordered Follow-up in 6 to 8 weeks or sooner as needed

## 2017-09-07 NOTE — Progress Notes (Signed)
PULMONARY CONSULT NOTE  Requesting MD/Service: Doy Hutching Date of initial consultation: 09/07/17 Reason for consultation: Former smoker, recent spont PTX  PT PROFILE: 72 y.o. male former smoker, hospitalized 5/7-5/17/19 with spontaneous right pneumothorax.  Underwent chest tube placement and talc pleurodesis.  DATA: CT chest 07/01/16: Multiple subpleural nodules are noted. The largest of these measures 4 mm in the right upper lobe. No follow-up needed if patient is low-risk (and has no known or suspected primary neoplasm). CT chest 08/19/17: R chest tube in place with tip directed towards the R lung apex. Trace residual pneumothorax at the R lung apex. Associated mild subcutaneous emphysema within the upper R chest wall. Focal dense consolidation at the R lung apex, measuring approximately 4 cm greatest dimension, most likely contusion and/or atelectasis. Small R pleural effusion.  Mild bibasilar atelectasis  INTERVAL:  HPI:  Patient was in his usual state of health until 08/11/2017 when he developed sudden onset of right-sided chest pain and dyspnea.  He presented to the emergency department where chest x-ray revealed severe pneumothorax on the right.  He underwent chest tube placement.  His hospitalization was complicated by recurrence of the pneumothorax.  He underwent VATS with talc pleurodesis.  He was discharged home 5/17 and is recovering back to his former baseline.  He still has some right-sided chest discomfort.  He he reports chronic cough (predating his recent hospitalization).  He saw his primary care physician recently and was started on Advair and montelukast.  His cough is much improved with these medications.  He has mild exertional dyspnea.  He notes this most commonly when walking inclines.  His cough is minimally productive.  His wife notes that he was "wheezing" prior to his recent hospitalization.  She indicates that this occurs commonly in the spring and fall correlating with pollen  season.  He denies fever, purulent sputum, hemoptysis, LE edema and calf tenderness.  Past Medical History:  Diagnosis Date  . Anxiety   . Cancer ([facility])    non hodgins  lymphoma  . Depression   . GERD (gastroesophageal reflux disease)   . History of hiatal hernia   . Hypertension   . Primary localized osteoarthritis of knee 09/26/2014  . Primary localized osteoarthrosis, shoulder region, rotator cuff arthropathy 10/04/2013  . Sleep apnea    cpap    Past Surgical History:  Procedure Laterality Date  . CARDIAC CATHETERIZATION     20 yrs. ago  . CHOLECYSTECTOMY    . EYE SURGERY    . JOINT REPLACEMENT     hip  . NASAL SINUS SURGERY     x2  . REVERSE SHOULDER ARTHROPLASTY Right 10/04/2013   Procedure: REVERSE SHOULDER ARTHROPLASTY;  Surgeon: Johnny Bridge, MD;  Location: [place];  Service: Orthopedics;  Laterality: Right;  . SHOULDER ACROMIOPLASTY     x 5 shoulder surgeries  . TOTAL KNEE ARTHROPLASTY Right 09/26/2014  . TOTAL KNEE ARTHROPLASTY Right 09/26/2014   Procedure: RIGHT TOTAL KNEE ARTHROPLASTY;  Surgeon: Marchia Bond, MD;  Location: [place];  Service: Orthopedics;  Laterality: Right;  Marland Kitchen VIDEO ASSISTED THORACOSCOPY (VATS) W/TALC PLEUADESIS Right 08/18/2017   Procedure: VIDEO ASSISTED THORACOSCOPY (VATS)POSSIBLE  W/TALC PLEUADESIS.POSSIBLE BLEBECTOMY;  Surgeon: Nestor Lewandowsky, MD;  Location: [facility];  Service: Thoracic;  Laterality: Right;    MEDICATIONS: I have reviewed all medications and confirmed regimen as documented  Social History   Socioeconomic History  . Marital status: Married    Spouse name: Not on file  . Number of children: Not on  file  . Years of education: Not on file  . Highest education level: Not on file  Occupational History  . Not on file  Social Needs  . Financial resource strain: Not on file  . Food insecurity:    Worry: Not on file    Inability: Not on file  . Transportation needs:    Medical: Not on file    Non-medical: Not on file   Tobacco Use  . Smoking status: Former Smoker    Packs/day: 2.00    Years: 20.00    Pack years: 40.00    Types: Cigarettes  . Smokeless tobacco: Never Used  Substance and Sexual Activity  . Alcohol use: No  . Drug use: No  . Sexual activity: Not on file  Lifestyle  . Physical activity:    Days per week: Not on file    Minutes per session: Not on file  . Stress: Not on file  Relationships  . Social connections:    Talks on phone: Not on file    Gets together: Not on file    Attends religious service: Not on file    Active member of club or organization: Not on file    Attends meetings of clubs or organizations: Not on file    Relationship status: Not on file  . Intimate partner violence:    Fear of current or ex partner: Not on file    Emotionally abused: Not on file    Physically abused: Not on file    Forced sexual activity: Not on file  Other Topics Concern  . Not on file  Social History Narrative  . Not on file    History reviewed. No pertinent family history.  ROS: No fever, myalgias/arthralgias, unexplained weight loss or weight gain No new focal weakness or sensory deficits No otalgia, hearing loss, visual changes, nasal and sinus symptoms, mouth and throat problems No neck pain or adenopathy No abdominal pain, N/V/D, diarrhea, change in bowel pattern No dysuria, change in urinary pattern   Vitals:   09/07/17 1009 09/07/17 1014  BP:  132/74  Pulse:  74  SpO2:  98%  Weight: 238 lb (108 kg)   Height: 5\' 11"  (1.803 m)      EXAM:  Gen: WDWN, No overt respiratory distress HEENT: NCAT, sclera white, oropharynx normal Neck: Supple without LAN, thyromegaly, JVD Lungs: breath sounds full with coarse right basilar crackles, no wheezes, percussion normal Cardiovascular: RRR, no murmurs noted Abdomen: Soft, nontender, normal BS Ext: LUE in arm brace, no clubbing, cyanosis, edema Neuro: CNs grossly intact, motor and sensory intact Skin: Limited exam, no  lesions noted  DATA:   BMP Latest Ref Rng & Units 08/21/2017 08/20/2017 08/13/2017  Glucose 65 - 99 mg/dL 112(H) 117(H) 108(H)  BUN 6 - 20 mg/dL 32(H) 30(H) 27(H)  Creatinine 0.61 - 1.24 mg/dL 1.51(H) 1.72(H) 1.43(H)  Sodium 135 - 145 mmol/L 134(L) 133(L) 137  Potassium 3.5 - 5.1 mmol/L 4.2 3.9 4.1  Chloride 101 - 111 mmol/L 101 99(L) 103  CO2 22 - 32 mmol/L 25 25 28   Calcium 8.9 - 10.3 mg/dL 8.8(L) 8.5(L) 9.1    CBC Latest Ref Rng & Units 08/20/2017 08/12/2017 08/11/2017  WBC 3.8 - 10.6 K/uL 7.9 5.6 5.3  Hemoglobin 13.0 - 18.0 g/dL 12.4(L) 12.9(L) 13.4  Hematocrit 40.0 - 52.0 % 35.3(L) 37.6(L) 38.6(L)  Platelets 150 - 440 K/uL 198 192 224    CXR 08/28/17: Blunting of right costophrenic angle and mild thickening of pleura  on the right.  No acute findings  I have personally reviewed all chest radiographs reported above including CXRs and CT chest unless otherwise indicated  IMPRESSION:     ICD-10-CM   1. Recent R spontaneous pneumothorax - s/p chest tubes and pleurodesis J93.83   2. Former smoker Z87.891 Pulmonary Function Test [facility] Only  3. Chronic cough, improved after initiation of Advair R05 Pulmonary Function Test [facility] Only   I doubt that Singulair is providing much benefit.  His improved cough is likely due to the Advair.  We discussed the process of spontaneous pneumothorax and the low risk of recurrence after pleurodesis.  Together, we reviewed all of his films in the office today.  PLAN:  He may discontinue Singulair Continue Advair HFA inhaler-2 inhalations twice a day.  Rinse mouth after use PFTs ordered Follow-up in 6 to 8 weeks or sooner as needed   Merton Border, MD PCCM service Mobile [phone] Pager [phone] 09/07/2017 1:31 PM

## 2017-09-14 DIAGNOSIS — M25512 Pain in left shoulder: Secondary | ICD-10-CM | POA: Diagnosis not present

## 2017-09-14 DIAGNOSIS — M25511 Pain in right shoulder: Secondary | ICD-10-CM | POA: Diagnosis not present

## 2017-09-14 DIAGNOSIS — M25561 Pain in right knee: Secondary | ICD-10-CM | POA: Diagnosis not present

## 2017-09-14 DIAGNOSIS — G5601 Carpal tunnel syndrome, right upper limb: Secondary | ICD-10-CM | POA: Diagnosis not present

## 2017-10-04 DIAGNOSIS — G4733 Obstructive sleep apnea (adult) (pediatric): Secondary | ICD-10-CM | POA: Diagnosis not present

## 2017-10-05 DIAGNOSIS — I10 Essential (primary) hypertension: Secondary | ICD-10-CM | POA: Diagnosis not present

## 2017-10-05 DIAGNOSIS — R7309 Other abnormal glucose: Secondary | ICD-10-CM | POA: Diagnosis not present

## 2017-10-05 DIAGNOSIS — E7800 Pure hypercholesterolemia, unspecified: Secondary | ICD-10-CM | POA: Diagnosis not present

## 2017-10-05 DIAGNOSIS — Z79899 Other long term (current) drug therapy: Secondary | ICD-10-CM | POA: Diagnosis not present

## 2017-10-23 DIAGNOSIS — R55 Syncope and collapse: Secondary | ICD-10-CM | POA: Diagnosis not present

## 2017-10-23 DIAGNOSIS — E7800 Pure hypercholesterolemia, unspecified: Secondary | ICD-10-CM | POA: Diagnosis not present

## 2017-10-23 DIAGNOSIS — N183 Chronic kidney disease, stage 3 (moderate): Secondary | ICD-10-CM | POA: Diagnosis not present

## 2017-10-23 DIAGNOSIS — I10 Essential (primary) hypertension: Secondary | ICD-10-CM | POA: Diagnosis not present

## 2017-10-26 DIAGNOSIS — E7800 Pure hypercholesterolemia, unspecified: Secondary | ICD-10-CM | POA: Diagnosis not present

## 2017-10-26 DIAGNOSIS — I10 Essential (primary) hypertension: Secondary | ICD-10-CM | POA: Diagnosis not present

## 2017-10-26 DIAGNOSIS — N183 Chronic kidney disease, stage 3 (moderate): Secondary | ICD-10-CM | POA: Diagnosis not present

## 2017-10-26 DIAGNOSIS — D649 Anemia, unspecified: Secondary | ICD-10-CM | POA: Diagnosis not present

## 2017-10-27 ENCOUNTER — Ambulatory Visit: Payer: PPO | Attending: Pulmonary Disease

## 2017-10-27 DIAGNOSIS — R05 Cough: Secondary | ICD-10-CM | POA: Insufficient documentation

## 2017-10-27 DIAGNOSIS — Z87891 Personal history of nicotine dependence: Secondary | ICD-10-CM | POA: Diagnosis not present

## 2017-10-27 DIAGNOSIS — R053 Chronic cough: Secondary | ICD-10-CM

## 2017-11-03 ENCOUNTER — Ambulatory Visit (INDEPENDENT_AMBULATORY_CARE_PROVIDER_SITE_OTHER): Payer: PPO | Admitting: Pulmonary Disease

## 2017-11-03 ENCOUNTER — Encounter: Payer: Self-pay | Admitting: Pulmonary Disease

## 2017-11-03 VITALS — BP 100/60 | HR 85 | Ht 71.0 in | Wt 240.0 lb

## 2017-11-03 DIAGNOSIS — Z87891 Personal history of nicotine dependence: Secondary | ICD-10-CM | POA: Diagnosis not present

## 2017-11-03 DIAGNOSIS — R05 Cough: Secondary | ICD-10-CM | POA: Diagnosis not present

## 2017-11-03 DIAGNOSIS — R053 Chronic cough: Secondary | ICD-10-CM

## 2017-11-03 DIAGNOSIS — J9383 Other pneumothorax: Secondary | ICD-10-CM | POA: Diagnosis not present

## 2017-11-03 MED ORDER — ALBUTEROL SULFATE HFA 108 (90 BASE) MCG/ACT IN AERS: 2.0000 | INHALATION_SPRAY | Freq: Four times a day (QID) | RESPIRATORY_TRACT | 3 refills | Status: AC | PRN

## 2017-11-03 NOTE — Patient Instructions (Signed)
Continue Advair HFA inhaler, 2 actuations twice a day.  Rinse mouth after use  Continue albuterol inhaler as needed for increased cough, shortness of breath, wheezing, chest tightness  Follow-up as needed for any chest, breathing or lung symptoms

## 2017-11-04 DIAGNOSIS — G4733 Obstructive sleep apnea (adult) (pediatric): Secondary | ICD-10-CM | POA: Diagnosis not present

## 2017-11-05 NOTE — Progress Notes (Signed)
PULMONARY OFFICE FOLLOW UP NOTE  Requesting MD/Service: Doy Hutching Date of initial consultation: 09/07/17 Reason for consultation: Former smoker, recent spont PTX  PT PROFILE: 72 y.o. male former smoker, hospitalized 5/7-5/17/19 with spontaneous right pneumothorax.  Underwent chest tube placement and talc pleurodesis.  DATA: CT chest 07/01/16: Multiple subpleural nodules are noted. The largest of these measures 4 mm in the right upper lobe. No follow-up needed if patient is low-risk (and has no known or suspected primary neoplasm). CT chest 08/19/17: R chest tube in place with tip directed towards the R lung apex. Trace residual pneumothorax at the R lung apex. Associated mild subcutaneous emphysema within the upper R chest wall. Focal dense consolidation at the R lung apex, measuring approximately 4 cm greatest dimension, most likely contusion and/or atelectasis. Small R pleural effusion.  Mild bibasilar atelectasis PFT 10/27/17: no definite obstruction, possible small airways disease, lung volumes normal, DLCO 72%, DLCO/VA 94%  INTERVAL: No major events  SUBJ:  This is a scheduled follow up to review PFTs. Last visit, I advised that he could stop Singulair which he has with no worsening of symptoms. He continues to have mild NP cough which he believes is relieved by Advair HFA. Otherwise, he has no new complaints. He rarely uses his rescue inhaler and does not feel significantly limited by DOE. He denies CP, fever, purulent sputum, hemoptysis, LE edema and calf tenderness.     OBJ: Vitals:   11/03/17 0953 11/03/17 0957  BP:  100/60  Pulse:  85  SpO2:  98%  Weight: 240 lb (108.9 kg)   Height: 5\' 11"  (1.803 m)   RA   EXAM:  Gen: NAD HEENT: NCAT, sclera white Neck: No JVD Lungs: breath sounds full, no wheezes or other adventitious sounds Cardiovascular: RRR, no murmurs Abdomen: Soft, nontender, normal BS Ext: without clubbing, cyanosis, edema Neuro: grossly intact Skin: Limited  exam, no lesions noted   DATA:   BMP Latest Ref Rng & Units 08/21/2017 08/20/2017 08/13/2017  Glucose 65 - 99 mg/dL 112(H) 117(H) 108(H)  BUN 6 - 20 mg/dL 32(H) 30(H) 27(H)  Creatinine 0.61 - 1.24 mg/dL 1.51(H) 1.72(H) 1.43(H)  Sodium 135 - 145 mmol/L 134(L) 133(L) 137  Potassium 3.5 - 5.1 mmol/L 4.2 3.9 4.1  Chloride 101 - 111 mmol/L 101 99(L) 103  CO2 22 - 32 mmol/L 25 25 28   Calcium 8.9 - 10.3 mg/dL 8.8(L) 8.5(L) 9.1    CBC Latest Ref Rng & Units 08/20/2017 08/12/2017 08/11/2017  WBC 3.8 - 10.6 K/uL 7.9 5.6 5.3  Hemoglobin 13.0 - 18.0 g/dL 12.4(L) 12.9(L) 13.4  Hematocrit 40.0 - 52.0 % 35.3(L) 37.6(L) 38.6(L)  Platelets 150 - 440 K/uL 198 192 224    CXR: No new film  I have personally reviewed all chest radiographs reported above including CXRs and CT chest unless otherwise indicated  IMPRESSION:     ICD-10-CM   1. Former smoker, remote Z87.891   2. Recent spontaneous pneumothorax, status post pleurodesis J93.83   3. Chronic cough, much improved with Advair HFA inhaler R05      PLAN:  Continue Advair HFA inhaler, 2 actuations twice a day.  Rinse mouth after use  Continue albuterol inhaler as needed for increased cough, shortness of breath, wheezing, chest tightness  Follow-up as needed for any chest, breathing or lung symptoms   Merton Border, MD PCCM service Mobile [phone] Pager [phone] 11/05/2017 11:13 PM

## 2017-12-04 DIAGNOSIS — G4733 Obstructive sleep apnea (adult) (pediatric): Secondary | ICD-10-CM | POA: Diagnosis not present

## 2017-12-05 DIAGNOSIS — G4733 Obstructive sleep apnea (adult) (pediatric): Secondary | ICD-10-CM | POA: Diagnosis not present

## 2017-12-10 DIAGNOSIS — S42495K Other nondisplaced fracture of lower end of left humerus, subsequent encounter for fracture with nonunion: Secondary | ICD-10-CM | POA: Diagnosis not present

## 2017-12-10 DIAGNOSIS — Z9889 Other specified postprocedural states: Secondary | ICD-10-CM | POA: Diagnosis not present

## 2018-01-04 DIAGNOSIS — G4733 Obstructive sleep apnea (adult) (pediatric): Secondary | ICD-10-CM | POA: Diagnosis not present

## 2018-01-06 DIAGNOSIS — G4733 Obstructive sleep apnea (adult) (pediatric): Secondary | ICD-10-CM | POA: Diagnosis not present

## 2018-01-08 DIAGNOSIS — Z23 Encounter for immunization: Secondary | ICD-10-CM | POA: Diagnosis not present

## 2018-01-12 DIAGNOSIS — H903 Sensorineural hearing loss, bilateral: Secondary | ICD-10-CM | POA: Diagnosis not present

## 2018-01-12 DIAGNOSIS — G4733 Obstructive sleep apnea (adult) (pediatric): Secondary | ICD-10-CM | POA: Diagnosis not present

## 2018-01-20 DIAGNOSIS — I10 Essential (primary) hypertension: Secondary | ICD-10-CM | POA: Diagnosis not present

## 2018-01-20 DIAGNOSIS — E7800 Pure hypercholesterolemia, unspecified: Secondary | ICD-10-CM | POA: Diagnosis not present

## 2018-01-20 DIAGNOSIS — J9311 Primary spontaneous pneumothorax: Secondary | ICD-10-CM | POA: Diagnosis not present

## 2018-01-20 DIAGNOSIS — R55 Syncope and collapse: Secondary | ICD-10-CM | POA: Diagnosis not present

## 2018-02-04 DIAGNOSIS — G4733 Obstructive sleep apnea (adult) (pediatric): Secondary | ICD-10-CM | POA: Diagnosis not present

## 2018-02-18 DIAGNOSIS — Z79899 Other long term (current) drug therapy: Secondary | ICD-10-CM | POA: Diagnosis not present

## 2018-02-18 DIAGNOSIS — E7800 Pure hypercholesterolemia, unspecified: Secondary | ICD-10-CM | POA: Diagnosis not present

## 2018-02-18 DIAGNOSIS — D649 Anemia, unspecified: Secondary | ICD-10-CM | POA: Diagnosis not present

## 2018-02-18 DIAGNOSIS — C8590 Non-Hodgkin lymphoma, unspecified, unspecified site: Secondary | ICD-10-CM | POA: Diagnosis not present

## 2018-02-18 DIAGNOSIS — N183 Chronic kidney disease, stage 3 (moderate): Secondary | ICD-10-CM | POA: Diagnosis not present

## 2018-02-18 DIAGNOSIS — I10 Essential (primary) hypertension: Secondary | ICD-10-CM | POA: Diagnosis not present

## 2018-02-18 DIAGNOSIS — Z1211 Encounter for screening for malignant neoplasm of colon: Secondary | ICD-10-CM | POA: Diagnosis not present

## 2018-02-18 DIAGNOSIS — R7309 Other abnormal glucose: Secondary | ICD-10-CM | POA: Diagnosis not present

## 2018-02-18 DIAGNOSIS — Z0000 Encounter for general adult medical examination without abnormal findings: Secondary | ICD-10-CM | POA: Diagnosis not present

## 2018-02-18 DIAGNOSIS — Z125 Encounter for screening for malignant neoplasm of prostate: Secondary | ICD-10-CM | POA: Diagnosis not present

## 2018-02-19 ENCOUNTER — Other Ambulatory Visit: Payer: Self-pay | Admitting: Orthopedic Surgery

## 2018-02-19 DIAGNOSIS — C866 Primary cutaneous CD30-positive T-cell proliferations: Secondary | ICD-10-CM

## 2018-02-25 DIAGNOSIS — Z1211 Encounter for screening for malignant neoplasm of colon: Secondary | ICD-10-CM | POA: Diagnosis not present

## 2018-03-02 ENCOUNTER — Other Ambulatory Visit: Payer: Self-pay | Admitting: Internal Medicine

## 2018-03-02 DIAGNOSIS — C866 Primary cutaneous CD30-positive T-cell proliferations: Secondary | ICD-10-CM

## 2018-03-06 DIAGNOSIS — G4733 Obstructive sleep apnea (adult) (pediatric): Secondary | ICD-10-CM | POA: Diagnosis not present

## 2018-03-17 ENCOUNTER — Ambulatory Visit
Admission: RE | Admit: 2018-03-17 | Discharge: 2018-03-17 | Disposition: A | Discharge status: Home / Self Care | Payer: PPO | Source: Ambulatory Visit | Attending: Orthopedic Surgery | Admitting: Orthopedic Surgery

## 2018-03-17 DIAGNOSIS — C866 Primary cutaneous CD30-positive T-cell proliferations: Secondary | ICD-10-CM | POA: Diagnosis not present

## 2018-03-17 DIAGNOSIS — R918 Other nonspecific abnormal finding of lung field: Secondary | ICD-10-CM | POA: Diagnosis not present

## 2018-03-23 DIAGNOSIS — H903 Sensorineural hearing loss, bilateral: Secondary | ICD-10-CM | POA: Diagnosis not present

## 2018-03-24 DIAGNOSIS — I10 Essential (primary) hypertension: Secondary | ICD-10-CM | POA: Diagnosis not present

## 2018-03-24 DIAGNOSIS — Z79899 Other long term (current) drug therapy: Secondary | ICD-10-CM | POA: Diagnosis not present

## 2018-03-24 DIAGNOSIS — R7309 Other abnormal glucose: Secondary | ICD-10-CM | POA: Diagnosis not present

## 2018-03-24 DIAGNOSIS — R972 Elevated prostate specific antigen [PSA]: Secondary | ICD-10-CM | POA: Diagnosis not present

## 2018-03-24 DIAGNOSIS — E7800 Pure hypercholesterolemia, unspecified: Secondary | ICD-10-CM | POA: Diagnosis not present

## 2018-04-06 DIAGNOSIS — G4733 Obstructive sleep apnea (adult) (pediatric): Secondary | ICD-10-CM | POA: Diagnosis not present

## 2018-05-03 DIAGNOSIS — G4733 Obstructive sleep apnea (adult) (pediatric): Secondary | ICD-10-CM | POA: Diagnosis not present

## 2018-05-07 DIAGNOSIS — G4733 Obstructive sleep apnea (adult) (pediatric): Secondary | ICD-10-CM | POA: Diagnosis not present

## 2018-06-05 DIAGNOSIS — G4733 Obstructive sleep apnea (adult) (pediatric): Secondary | ICD-10-CM | POA: Diagnosis not present

## 2018-06-23 DIAGNOSIS — Z79899 Other long term (current) drug therapy: Secondary | ICD-10-CM | POA: Diagnosis not present

## 2018-06-23 DIAGNOSIS — Z0000 Encounter for general adult medical examination without abnormal findings: Secondary | ICD-10-CM | POA: Diagnosis not present

## 2018-06-23 DIAGNOSIS — C8590 Non-Hodgkin lymphoma, unspecified, unspecified site: Secondary | ICD-10-CM | POA: Diagnosis not present

## 2018-06-23 DIAGNOSIS — N183 Chronic kidney disease, stage 3 (moderate): Secondary | ICD-10-CM | POA: Diagnosis not present

## 2018-06-23 DIAGNOSIS — I10 Essential (primary) hypertension: Secondary | ICD-10-CM | POA: Diagnosis not present

## 2018-06-23 DIAGNOSIS — E7800 Pure hypercholesterolemia, unspecified: Secondary | ICD-10-CM | POA: Diagnosis not present

## 2018-07-12 DIAGNOSIS — G4733 Obstructive sleep apnea (adult) (pediatric): Secondary | ICD-10-CM | POA: Diagnosis not present

## 2018-07-12 DIAGNOSIS — J301 Allergic rhinitis due to pollen: Secondary | ICD-10-CM | POA: Diagnosis not present

## 2018-07-16 DIAGNOSIS — G4733 Obstructive sleep apnea (adult) (pediatric): Secondary | ICD-10-CM | POA: Diagnosis not present

## 2018-07-26 DIAGNOSIS — J9311 Primary spontaneous pneumothorax: Secondary | ICD-10-CM | POA: Diagnosis not present

## 2018-07-26 DIAGNOSIS — N183 Chronic kidney disease, stage 3 (moderate): Secondary | ICD-10-CM | POA: Diagnosis not present

## 2018-07-26 DIAGNOSIS — I10 Essential (primary) hypertension: Secondary | ICD-10-CM | POA: Diagnosis not present

## 2018-07-26 DIAGNOSIS — E7800 Pure hypercholesterolemia, unspecified: Secondary | ICD-10-CM | POA: Diagnosis not present

## 2018-07-26 DIAGNOSIS — C8590 Non-Hodgkin lymphoma, unspecified, unspecified site: Secondary | ICD-10-CM | POA: Diagnosis not present

## 2018-07-26 DIAGNOSIS — R55 Syncope and collapse: Secondary | ICD-10-CM | POA: Diagnosis not present

## 2018-08-23 DIAGNOSIS — G4733 Obstructive sleep apnea (adult) (pediatric): Secondary | ICD-10-CM | POA: Diagnosis not present

## 2018-09-09 DIAGNOSIS — M545 Low back pain: Secondary | ICD-10-CM | POA: Diagnosis not present

## 2018-09-09 DIAGNOSIS — M5416 Radiculopathy, lumbar region: Secondary | ICD-10-CM | POA: Diagnosis not present

## 2018-09-09 DIAGNOSIS — M48061 Spinal stenosis, lumbar region without neurogenic claudication: Secondary | ICD-10-CM | POA: Diagnosis not present

## 2018-09-14 DIAGNOSIS — R21 Rash and other nonspecific skin eruption: Secondary | ICD-10-CM | POA: Diagnosis not present

## 2018-09-14 DIAGNOSIS — R51 Headache: Secondary | ICD-10-CM | POA: Diagnosis not present

## 2018-09-16 DIAGNOSIS — M5416 Radiculopathy, lumbar region: Secondary | ICD-10-CM | POA: Diagnosis not present

## 2018-09-16 DIAGNOSIS — M48061 Spinal stenosis, lumbar region without neurogenic claudication: Secondary | ICD-10-CM | POA: Diagnosis not present

## 2018-09-16 DIAGNOSIS — M545 Low back pain: Secondary | ICD-10-CM | POA: Diagnosis not present

## 2018-09-17 DIAGNOSIS — S42495K Other nondisplaced fracture of lower end of left humerus, subsequent encounter for fracture with nonunion: Secondary | ICD-10-CM | POA: Diagnosis not present

## 2018-09-17 DIAGNOSIS — M1711 Unilateral primary osteoarthritis, right knee: Secondary | ICD-10-CM | POA: Diagnosis not present

## 2018-09-17 DIAGNOSIS — M25511 Pain in right shoulder: Secondary | ICD-10-CM | POA: Diagnosis not present

## 2018-09-30 DIAGNOSIS — M545 Low back pain: Secondary | ICD-10-CM | POA: Diagnosis not present

## 2018-09-30 DIAGNOSIS — M48061 Spinal stenosis, lumbar region without neurogenic claudication: Secondary | ICD-10-CM | POA: Diagnosis not present

## 2018-10-07 DIAGNOSIS — M48061 Spinal stenosis, lumbar region without neurogenic claudication: Secondary | ICD-10-CM | POA: Diagnosis not present

## 2018-10-07 DIAGNOSIS — M545 Low back pain: Secondary | ICD-10-CM | POA: Diagnosis not present

## 2018-10-25 DIAGNOSIS — R002 Palpitations: Secondary | ICD-10-CM | POA: Diagnosis not present

## 2018-10-25 DIAGNOSIS — N183 Chronic kidney disease, stage 3 (moderate): Secondary | ICD-10-CM | POA: Diagnosis not present

## 2018-10-25 DIAGNOSIS — E7800 Pure hypercholesterolemia, unspecified: Secondary | ICD-10-CM | POA: Diagnosis not present

## 2018-10-25 DIAGNOSIS — Z79899 Other long term (current) drug therapy: Secondary | ICD-10-CM | POA: Diagnosis not present

## 2018-10-25 DIAGNOSIS — C8590 Non-Hodgkin lymphoma, unspecified, unspecified site: Secondary | ICD-10-CM | POA: Diagnosis not present

## 2018-10-25 DIAGNOSIS — R7309 Other abnormal glucose: Secondary | ICD-10-CM | POA: Diagnosis not present

## 2018-10-25 DIAGNOSIS — I10 Essential (primary) hypertension: Secondary | ICD-10-CM | POA: Diagnosis not present

## 2018-10-25 DIAGNOSIS — R8290 Unspecified abnormal findings in urine: Secondary | ICD-10-CM | POA: Diagnosis not present

## 2018-10-27 DIAGNOSIS — J9311 Primary spontaneous pneumothorax: Secondary | ICD-10-CM | POA: Diagnosis not present

## 2018-10-27 DIAGNOSIS — N183 Chronic kidney disease, stage 3 (moderate): Secondary | ICD-10-CM | POA: Diagnosis not present

## 2018-10-27 DIAGNOSIS — I10 Essential (primary) hypertension: Secondary | ICD-10-CM | POA: Diagnosis not present

## 2018-10-27 DIAGNOSIS — E7800 Pure hypercholesterolemia, unspecified: Secondary | ICD-10-CM | POA: Diagnosis not present

## 2018-11-01 DIAGNOSIS — M48061 Spinal stenosis, lumbar region without neurogenic claudication: Secondary | ICD-10-CM | POA: Diagnosis not present

## 2018-11-01 DIAGNOSIS — M545 Low back pain: Secondary | ICD-10-CM | POA: Diagnosis not present

## 2018-11-26 DIAGNOSIS — Z79899 Other long term (current) drug therapy: Secondary | ICD-10-CM | POA: Diagnosis not present

## 2018-12-06 DIAGNOSIS — M545 Low back pain: Secondary | ICD-10-CM | POA: Diagnosis not present

## 2018-12-06 DIAGNOSIS — M48061 Spinal stenosis, lumbar region without neurogenic claudication: Secondary | ICD-10-CM | POA: Diagnosis not present

## 2018-12-08 DIAGNOSIS — G4733 Obstructive sleep apnea (adult) (pediatric): Secondary | ICD-10-CM | POA: Diagnosis not present

## 2018-12-13 DIAGNOSIS — I471 Supraventricular tachycardia: Secondary | ICD-10-CM | POA: Insufficient documentation

## 2018-12-13 NOTE — Progress Notes (Signed)
Cardiology Office Note  Date:  12/14/2018   ID:  Gavin Maxwell, DOB Jun 21, 1945, MRN [id number]  PCP:  Idelle Crouch, MD   Chief Complaint  Patient presents with  . other    .Syncope, plpatations. Medications reviewed verbally     HPI:  Gavin Maxwell is a 73 year old gentleman with past medical history of Spontaneous pneumothorax 2019 Non-Hodgkin's lymphoma Chronic anemia Hypertension Chronic kidney disease stage III Hyperlipidemia Former smoker quit 25 years ago Presenting by referral from Dr. Doy Hutching for syncope, heart palpitations, tachycardia  In the hospital May 2019 acute shortness of breath primary spontaneous pneumothorax on the right Underwent chest tube insertion, right thoracoscopy and apical blebectomy and talc pleurodesis  Noted to have intermittent episodes of SVT treated with beta-blockers Followed by pulmonary  Prior CTs personally reviewed by myself  Rare episodes of near syncope, typically when sitting down One day sitting at computer, "almost went out" on 03/2017 Seen by [facility]:  Echo:  NORMAL LEFT VENTRICULAR SYSTOLIC FUNCTION   WITH MILD LVH NORMAL RIGHT VENTRICULAR SYSTOLIC FUNCTION MILD VALVULAR REGURGITATION (See above) NO VALVULAR STENOSIS MILD TR, PR TRIVIAL MR EF 50%  Carotid u/s 04/09/2017 which revealed minimal atherosclerotic plaque bilaterally without hemodynamically significant stenosis.  Seen in [place], back doctor "abn heart rhythm, irregular, did not feel well":  EKG personally reviewed by myself on todays visit Shows normal sinus rhythm with rate 78 bpm no significant ST-T wave changes  Prior event monitor 24 hours done through [facility] showing rare short episodes atrial tachycardia.  Did not have a " spell" when he had the monitor in place   PMH:   has a past medical history of Anxiety, Cancer ([place]), Depression, GERD (gastroesophageal reflux disease), History of hiatal hernia, Hypertension, Primary localized  osteoarthritis of knee (09/26/2014), Primary localized osteoarthrosis, shoulder region, rotator cuff arthropathy (10/04/2013), and Sleep apnea.  PSH:    Past Surgical History:  Procedure Laterality Date  . CARDIAC CATHETERIZATION     20 yrs. ago  . CHOLECYSTECTOMY    . EYE SURGERY    . JOINT REPLACEMENT     hip  . NASAL SINUS SURGERY     x2  . REVERSE SHOULDER ARTHROPLASTY Right 10/04/2013   Procedure: REVERSE SHOULDER ARTHROPLASTY;  Surgeon: Johnny Bridge, MD;  Location: [place];  Service: Orthopedics;  Laterality: Right;  . SHOULDER ACROMIOPLASTY     x 5 shoulder surgeries  . TOTAL KNEE ARTHROPLASTY Right 09/26/2014  . TOTAL KNEE ARTHROPLASTY Right 09/26/2014   Procedure: RIGHT TOTAL KNEE ARTHROPLASTY;  Surgeon: Marchia Bond, MD;  Location: [place];  Service: Orthopedics;  Laterality: Right;  Marland Kitchen VIDEO ASSISTED THORACOSCOPY (VATS) W/TALC PLEUADESIS Right 08/18/2017   Procedure: VIDEO ASSISTED THORACOSCOPY (VATS)POSSIBLE  W/TALC PLEUADESIS.POSSIBLE BLEBECTOMY;  Surgeon: Nestor Lewandowsky, MD;  Location: [facility];  Service: Thoracic;  Laterality: Right;    Current Outpatient Medications  Medication Sig Dispense Refill  . albuterol (PROVENTIL HFA;VENTOLIN HFA) 108 (90 Base) MCG/ACT inhaler Inhale 2 puffs into the lungs every 6 (six) hours as needed for wheezing or shortness of breath. 1 Inhaler 3  . aspirin EC 81 MG tablet Take 81 mg by mouth every morning.    Marland Kitchen atorvastatin (LIPITOR) 80 MG tablet Take 80 mg by mouth daily with supper.    . Calcium Carbonate (CALCIUM 600 PO) Take 600 mg by mouth daily.    . clotrimazole-betamethasone (LOTRISONE) cream APPLY TO AFFECTED AREA TWICE A DAY  0  . fenofibrate 160 MG tablet Take 160  mg by mouth every morning.    . ferrous sulfate (IRON SUPPLEMENT) 325 (65 FE) MG tablet Take 325 mg by mouth daily.    . fluticasone (FLONASE) 50 MCG/ACT nasal spray Place 2 sprays into both nostrils daily.     . fluticasone-salmeterol (ADVAIR HFA) 115-21 MCG/ACT inhaler  Inhale 2 puffs into the lungs 2 (two) times daily.    Marland Kitchen gabapentin (NEURONTIN) 300 MG capsule Take 300 mg by mouth 2 (two) times daily.    Marland Kitchen HYDROcodone-acetaminophen (NORCO/VICODIN) 5-325 MG tablet Take 1 tablet by mouth every 6 (six) hours as needed for moderate pain. 12 tablet 0  . metoprolol succinate (TOPROL-XL) 25 MG 24 hr tablet Take 25 mg by mouth daily.    . Multiple Vitamins-Minerals (MULTIVITAMIN ADULT PO) Take 1 tablet by mouth 1 day or 1 dose.    . pantoprazole (PROTONIX) 40 MG tablet Take 40 mg by mouth daily.     . sertraline (ZOLOFT) 50 MG tablet Take 50 mg by mouth at bedtime.    . metoprolol tartrate (LOPRESSOR) 25 MG tablet Take 1 tablet (25 mg total) by mouth 2 (two) times daily. (Patient not taking: Reported on 12/14/2018) 60 tablet 0  . montelukast (SINGULAIR) 10 MG tablet Take 10 mg by mouth at bedtime.     No current facility-administered medications for this visit.      Allergies:   Augmentin [amoxicillin-pot clavulanate], Celebrex [celecoxib], and Oxycodone   Social History:  The patient  reports that he has quit smoking. His smoking use included cigarettes. He has a 40.00 pack-year smoking history. He has never used smokeless tobacco. He reports that he does not drink alcohol or use drugs.   Family History:   family history is not on file.    Review of Systems: Review of Systems  Constitutional: Negative.   HENT: Negative.   Respiratory: Negative.   Cardiovascular: Negative.   Gastrointestinal: Negative.   Musculoskeletal: Negative.   Neurological: Positive for dizziness.       Near syncope episodes  Psychiatric/Behavioral: Negative.   All other systems reviewed and are negative.    PHYSICAL EXAM: VS:  BP 118/78 (BP Location: Right Arm, Patient Position: Sitting, Cuff Size: Large)   Pulse 78   Ht 5\' 11"  (1.803 m)   Wt 233 lb (105.7 kg)   SpO2 98%   BMI 32.50 kg/m  , BMI Body mass index is 32.5 kg/m. GEN: Well nourished, well developed, in no acute  distress HEENT: normal Neck: no JVD, carotid bruits, or masses Cardiac: RRR; no murmurs, rubs, or gallops,no edema  Respiratory:  clear to auscultation bilaterally, normal work of breathing GI: soft, nontender, nondistended, + BS MS: no deformity or atrophy Skin: warm and dry, no rash Neuro:  Strength and sensation are intact Psych: euthymic mood, full affect   Recent Labs: No results found for requested labs within last 8760 hours.    Lipid Panel No results found for: CHOL, HDL, LDLCALC, TRIG    Wt Readings from Last 3 Encounters:  12/14/18 233 lb (105.7 kg)  11/03/17 240 lb (108.9 kg)  09/07/17 238 lb (108 kg)       ASSESSMENT AND PLAN:  Problem List Items Addressed This Visit      Cardiology Problems   SVT (supraventricular tachycardia) (HCC)   Relevant Medications   metoprolol succinate (TOPROL-XL) 25 MG 24 hr tablet   Other Relevant Orders   EKG 12-Lead   LONG TERM MONITOR (3-14 DAYS)     Other  CKD (chronic kidney disease) stage 3, GFR 30-59 ml/min (HCC) - Primary   Primary spontaneous pneumothorax    Other Visit Diagnoses    Near syncope       Relevant Medications   metoprolol succinate (TOPROL-XL) 25 MG 24 hr tablet     Reports that he did not appreciate SVT episodes in the past Has episodes of near syncope unclear etiology Typically happens when he is sitting down, less likely orthostasis despite borderline low blood pressure We clarified his medication with him, he is taking metoprolol succinate 25 daily in the evening Stays hydrated Discussed various processes that could contribute to near syncope Concern for arrhythmia given acute onset from a sitting position Recommended a ZIO two-week monitor May need a repeat if unable to capture an event Typically happens every 2 to 3 weeks  Prior carotid ultrasound echocardiogram reviewed with him in detail  Disposition:   F/U as needed   Total encounter time more than 25 minutes  Greater than 50%  was spent in counseling and coordination of care with the patient    Signed, Esmond Plants, M.D., Ph.D. [place], [place]

## 2018-12-14 ENCOUNTER — Ambulatory Visit (INDEPENDENT_AMBULATORY_CARE_PROVIDER_SITE_OTHER): Payer: PPO | Admitting: Cardiovascular Disease

## 2018-12-14 ENCOUNTER — Encounter: Payer: Self-pay | Admitting: Cardiovascular Disease

## 2018-12-14 ENCOUNTER — Other Ambulatory Visit: Payer: Self-pay

## 2018-12-14 ENCOUNTER — Ambulatory Visit (INDEPENDENT_AMBULATORY_CARE_PROVIDER_SITE_OTHER): Payer: PPO

## 2018-12-14 VITALS — BP 118/78 | HR 78 | Ht 71.0 in | Wt 233.0 lb

## 2018-12-14 DIAGNOSIS — I471 Supraventricular tachycardia: Secondary | ICD-10-CM

## 2018-12-14 DIAGNOSIS — R55 Syncope and collapse: Secondary | ICD-10-CM

## 2018-12-14 DIAGNOSIS — N183 Chronic kidney disease, stage 3 unspecified: Secondary | ICD-10-CM

## 2018-12-14 DIAGNOSIS — J9311 Primary spontaneous pneumothorax: Secondary | ICD-10-CM | POA: Diagnosis not present

## 2018-12-14 NOTE — Patient Instructions (Addendum)
Medication Instructions:  No changes  If you need a refill on your cardiac medications before your next appointment, please call your pharmacy.    Lab work: No new labs needed   If you have labs (blood work) drawn today and your tests are completely normal, you will receive your results only by: Marland Kitchen MyChart Message (if you have MyChart) OR . A paper copy in the mail If you have any lab test that is abnormal or we need to change your treatment, we will call you to review the results.   Testing/Procedures: Zio monitor for near syncope/dizzy episodes Your physician has recommended that you wear a Zio monitor. This monitor is a medical device that records the heart's electrical activity. Doctors most often use these monitors to diagnose arrhythmias. Arrhythmias are problems with the speed or rhythm of the heartbeat. The monitor is a small device applied to your chest. You can wear one while you do your normal daily activities. While wearing this monitor if you have any symptoms to push the button and record what you felt. Once you have worn this monitor for the period of time provider prescribed (Usually 14 days), you will return the monitor device in the postage paid box. Once it is returned they will download the data collected and provide [place] with a report which the provider will then review and we will call you with those results. Important tips:  1. Avoid showering during the first 24 hours of wearing the monitor. 2. Avoid excessive sweating to help maximize wear time. 3. Do not submerge the device, no hot tubs, and no swimming pools. 4. Keep any lotions or oils away from the patch. 5. After 24 hours you may shower with the patch on. Take brief showers with your back facing the shower head.  6. Do not remove patch once it has been placed because that will interrupt data and decrease adhesive wear time. 7. Push the button when you have any symptoms and write down what you were feeling. 8. Once  you have completed wearing your monitor, remove and place into box which has postage paid and place in your outgoing mailbox.  9. If for some reason you have misplaced your box then call our office and we can provide another box and/or mail it off for you.         Follow-Up: At [facility], you and your health needs are our priority.  As part of our continuing mission to provide you with exceptional heart care, we have created designated Provider Care Teams.  These Care Teams include your primary Cardiologist (physician) and Advanced Practice Providers (APPs -  Physician Assistants and Nurse Practitioners) who all work together to provide you with the care you need, when you need it.  . You will need a follow up appointment as needed  . Providers on your designated Care Team:   . Murray Hodgkins, NP . Christell Faith, PA-C . Marrianne Mood, PA-C  Any Other Special Instructions Will Be Listed Below (If Applicable).  For educational health videos Log in to : [email or web address] Or : [email or web address], password : [email or web address]

## 2018-12-27 DIAGNOSIS — M48061 Spinal stenosis, lumbar region without neurogenic claudication: Secondary | ICD-10-CM | POA: Diagnosis not present

## 2018-12-27 DIAGNOSIS — M545 Low back pain: Secondary | ICD-10-CM | POA: Diagnosis not present

## 2018-12-27 DIAGNOSIS — N183 Chronic kidney disease, stage 3 (moderate): Secondary | ICD-10-CM | POA: Diagnosis not present

## 2018-12-27 DIAGNOSIS — I10 Essential (primary) hypertension: Secondary | ICD-10-CM | POA: Diagnosis not present

## 2018-12-27 DIAGNOSIS — Z79899 Other long term (current) drug therapy: Secondary | ICD-10-CM | POA: Diagnosis not present

## 2018-12-27 DIAGNOSIS — Z23 Encounter for immunization: Secondary | ICD-10-CM | POA: Diagnosis not present

## 2018-12-28 DIAGNOSIS — I471 Supraventricular tachycardia: Secondary | ICD-10-CM | POA: Diagnosis not present

## 2019-01-04 DIAGNOSIS — M545 Low back pain: Secondary | ICD-10-CM | POA: Diagnosis not present

## 2019-01-11 DIAGNOSIS — M5416 Radiculopathy, lumbar region: Secondary | ICD-10-CM | POA: Diagnosis not present

## 2019-01-11 DIAGNOSIS — M545 Low back pain: Secondary | ICD-10-CM | POA: Diagnosis not present

## 2019-01-21 DIAGNOSIS — M5416 Radiculopathy, lumbar region: Secondary | ICD-10-CM | POA: Diagnosis not present

## 2019-01-21 DIAGNOSIS — M545 Low back pain: Secondary | ICD-10-CM | POA: Diagnosis not present

## 2019-01-25 ENCOUNTER — Telehealth: Payer: Self-pay | Admitting: Cardiovascular Disease

## 2019-01-25 NOTE — Telephone Encounter (Signed)
Patient calling to check the status of monitor results  Please call to discuss - try both home and cell numbers

## 2019-01-25 NOTE — Telephone Encounter (Signed)
Unable to locate patient's ZIO registration on ZIO [email or web address] Imaging reports tab does not have patient's info undated with the info either. Called ZIO and they have the patient's name in the system with the associated ZIO ID [id number]. I verified the number and name again. Representative advised that if I go ahead register patient on ZIO with the ID then they will process the results.  Attempted to call patient to given him update. Left message to call back.

## 2019-01-26 NOTE — Telephone Encounter (Signed)
Patient returning call.  Patient advised of note below and understands we will call when zio results available to relay.

## 2019-01-30 DIAGNOSIS — I471 Supraventricular tachycardia: Secondary | ICD-10-CM | POA: Diagnosis not present

## 2019-01-31 ENCOUNTER — Telehealth: Payer: Self-pay | Admitting: Cardiovascular Disease

## 2019-01-31 NOTE — Telephone Encounter (Signed)
[employer] calling with abnormal ZIO results Transferred to [employer]

## 2019-01-31 NOTE — Telephone Encounter (Signed)
  Stat triage called received from [facility]. Patient Zio monitor demonstrated 86 runs of SVT some symptomatic. Fastest event lastin 47min 19 sec with a max HR of 200bpm. Longest lasting 12 min with an average HR of 147bpm. Final report is available for MD review.  Final report printed and given to Dr. Rockey Situ to review and advise

## 2019-02-04 NOTE — Telephone Encounter (Signed)
Dr. Rockey Situ, his monitor has been uploaded and in your in basket to review. Thanks!

## 2019-02-08 ENCOUNTER — Telehealth: Payer: Self-pay | Admitting: *Deleted

## 2019-02-08 DIAGNOSIS — I471 Supraventricular tachycardia, unspecified: Secondary | ICD-10-CM

## 2019-02-08 NOTE — Telephone Encounter (Signed)
Spoke with patient and reviewed results and recommendations by Dr. Rockey Situ. He verbalized understanding and advised that [facility] office should be calling to set up appointment for him to meet with one of there providers. He verbalized understanding with no further questions at this time. Will route message to providers nurse as well.

## 2019-02-17 DIAGNOSIS — M48061 Spinal stenosis, lumbar region without neurogenic claudication: Secondary | ICD-10-CM | POA: Diagnosis not present

## 2019-02-17 DIAGNOSIS — M5416 Radiculopathy, lumbar region: Secondary | ICD-10-CM | POA: Diagnosis not present

## 2019-02-17 DIAGNOSIS — M545 Low back pain: Secondary | ICD-10-CM | POA: Diagnosis not present

## 2019-02-21 ENCOUNTER — Other Ambulatory Visit: Payer: Self-pay

## 2019-02-21 DIAGNOSIS — Z20822 Contact with and (suspected) exposure to covid-19: Secondary | ICD-10-CM

## 2019-02-22 ENCOUNTER — Telehealth: Payer: Self-pay | Admitting: Cardiology

## 2019-02-22 LAB — NOVEL CORONAVIRUS, NAA: SARS-CoV-2, NAA: NOT DETECTED

## 2019-02-22 NOTE — Telephone Encounter (Signed)
New message   Patient requesting spouse to accompany him during 11/20 appointment to hear information given. Ok to [employer] vcml

## 2019-02-23 NOTE — Telephone Encounter (Signed)
Left detailed message informing pt ok for wife to accompany him to Friday's appt.

## 2019-02-23 NOTE — Telephone Encounter (Signed)
Please review patients chart in if patients spouse can come to appointment with patient.  Patient seeing Dr. Curt Bears 02/25/2019. Thanks !

## 2019-02-24 ENCOUNTER — Other Ambulatory Visit: Payer: Self-pay | Admitting: Internal Medicine

## 2019-02-24 DIAGNOSIS — Z0000 Encounter for general adult medical examination without abnormal findings: Secondary | ICD-10-CM | POA: Diagnosis not present

## 2019-02-24 DIAGNOSIS — Z79899 Other long term (current) drug therapy: Secondary | ICD-10-CM | POA: Diagnosis not present

## 2019-02-24 DIAGNOSIS — I10 Essential (primary) hypertension: Secondary | ICD-10-CM | POA: Diagnosis not present

## 2019-02-24 DIAGNOSIS — Z125 Encounter for screening for malignant neoplasm of prostate: Secondary | ICD-10-CM | POA: Diagnosis not present

## 2019-02-24 DIAGNOSIS — N1832 Chronic kidney disease, stage 3b: Secondary | ICD-10-CM | POA: Diagnosis not present

## 2019-02-24 DIAGNOSIS — D649 Anemia, unspecified: Secondary | ICD-10-CM | POA: Diagnosis not present

## 2019-02-24 DIAGNOSIS — R972 Elevated prostate specific antigen [PSA]: Secondary | ICD-10-CM | POA: Diagnosis not present

## 2019-02-24 DIAGNOSIS — C8590 Non-Hodgkin lymphoma, unspecified, unspecified site: Secondary | ICD-10-CM

## 2019-02-24 DIAGNOSIS — Z1211 Encounter for screening for malignant neoplasm of colon: Secondary | ICD-10-CM | POA: Diagnosis not present

## 2019-02-24 DIAGNOSIS — R7309 Other abnormal glucose: Secondary | ICD-10-CM | POA: Diagnosis not present

## 2019-02-24 DIAGNOSIS — E7800 Pure hypercholesterolemia, unspecified: Secondary | ICD-10-CM | POA: Diagnosis not present

## 2019-02-25 ENCOUNTER — Ambulatory Visit (INDEPENDENT_AMBULATORY_CARE_PROVIDER_SITE_OTHER): Payer: PPO | Admitting: Cardiology

## 2019-02-25 ENCOUNTER — Other Ambulatory Visit: Payer: Self-pay

## 2019-02-25 ENCOUNTER — Encounter: Payer: Self-pay | Admitting: Cardiology

## 2019-02-25 VITALS — BP 122/74 | HR 77 | Ht 71.0 in | Wt 235.0 lb

## 2019-02-25 DIAGNOSIS — I471 Supraventricular tachycardia: Secondary | ICD-10-CM

## 2019-02-25 DIAGNOSIS — M545 Low back pain: Secondary | ICD-10-CM | POA: Diagnosis not present

## 2019-02-25 DIAGNOSIS — M48061 Spinal stenosis, lumbar region without neurogenic claudication: Secondary | ICD-10-CM | POA: Diagnosis not present

## 2019-02-25 DIAGNOSIS — M5116 Intervertebral disc disorders with radiculopathy, lumbar region: Secondary | ICD-10-CM | POA: Diagnosis not present

## 2019-02-25 DIAGNOSIS — Z01812 Encounter for preprocedural laboratory examination: Secondary | ICD-10-CM

## 2019-02-25 DIAGNOSIS — M5416 Radiculopathy, lumbar region: Secondary | ICD-10-CM | POA: Diagnosis not present

## 2019-02-25 NOTE — Addendum Note (Signed)
Addended by: Stanton Kidney on: 02/25/2019 12:17 PM   Modules accepted: Orders

## 2019-02-25 NOTE — Progress Notes (Signed)
Electrophysiology Office Note   Date:  02/25/2019   ID:  Jeston, Stathis [birth year], MRN [id number]  PCP:  Idelle Crouch, MD  Cardiologist:  Rockey Situ Primary Electrophysiologist:  Anoop Hemmer Meredith Leeds, MD    Chief Complaint: SVT   History of Present Illness: Gavin Maxwell is a 73 y.o. male who is being seen today for the evaluation of SVT at the request of Gollan, Kathlene November, MD. Presenting today for electrophysiology evaluation.  He has a history of non-Hodgkin's lymphoma, chronic anemia, hypertension, stage III CKD.  He presented to cardiology clinic for syncope, palpitations, and tachycardia.  He is currently on beta-blockers for his intermittent episodes of SVT.  He did have a spontaneous pneumothorax May 2019.  He does have rare episodes of syncope.  They occur mostly when sitting down.  He wore a cardiac monitor that showed episodes of SVT that occur at the time of his episodes of near syncope.  Today, he denies symptoms of palpitations, chest pain, shortness of breath, orthopnea, PND, lower extremity edema, claudication, dizziness, presyncope, syncope, bleeding, or neurologic sequela. The patient is tolerating medications without difficulties.    Past Medical History:  Diagnosis Date   Anxiety    Cancer ([place])    non hodgins  lymphoma   Depression    GERD (gastroesophageal reflux disease)    History of hiatal hernia    Hypertension    Primary localized osteoarthritis of knee 09/26/2014   Primary localized osteoarthrosis, shoulder region, rotator cuff arthropathy 10/04/2013   Sleep apnea    cpap   Past Surgical History:  Procedure Laterality Date   CARDIAC CATHETERIZATION     20 yrs. ago   CHOLECYSTECTOMY     EYE SURGERY     JOINT REPLACEMENT     hip   NASAL SINUS SURGERY     x2   REVERSE SHOULDER ARTHROPLASTY Right 10/04/2013   Procedure: REVERSE SHOULDER ARTHROPLASTY;  Surgeon: Johnny Bridge, MD;  Location: [facility];  Service: Orthopedics;   Laterality: Right;   SHOULDER ACROMIOPLASTY     x 5 shoulder surgeries   TOTAL KNEE ARTHROPLASTY Right 09/26/2014   TOTAL KNEE ARTHROPLASTY Right 09/26/2014   Procedure: RIGHT TOTAL KNEE ARTHROPLASTY;  Surgeon: Marchia Bond, MD;  Location: [facility];  Service: Orthopedics;  Laterality: Right;   VIDEO ASSISTED THORACOSCOPY (VATS) W/TALC PLEUADESIS Right 08/18/2017   Procedure: VIDEO ASSISTED THORACOSCOPY (VATS)POSSIBLE  W/TALC PLEUADESIS.POSSIBLE BLEBECTOMY;  Surgeon: Nestor Lewandowsky, MD;  Location: [facility];  Service: Thoracic;  Laterality: Right;     Current Outpatient Medications  Medication Sig Dispense Refill   albuterol (PROVENTIL HFA;VENTOLIN HFA) 108 (90 Base) MCG/ACT inhaler Inhale 2 puffs into the lungs every 6 (six) hours as needed for wheezing or shortness of breath. 1 Inhaler 3   aspirin EC 81 MG tablet Take 81 mg by mouth every morning.     atorvastatin (LIPITOR) 80 MG tablet Take 80 mg by mouth daily with supper.     Calcium Carbonate (CALCIUM 600 PO) Take 600 mg by mouth daily.     clotrimazole-betamethasone (LOTRISONE) cream APPLY TO AFFECTED AREA TWICE A DAY  0   fenofibrate 160 MG tablet Take 160 mg by mouth every morning.     ferrous sulfate (IRON SUPPLEMENT) 325 (65 FE) MG tablet Take 325 mg by mouth daily.     fluticasone (FLONASE) 50 MCG/ACT nasal spray Place 2 sprays into both nostrils daily.      gabapentin (NEURONTIN) 300 MG capsule Take 300  mg by mouth 2 (two) times daily.     HYDROcodone-acetaminophen (NORCO/VICODIN) 5-325 MG tablet Take 1 tablet by mouth every 6 (six) hours as needed for moderate pain. 12 tablet 0   magnesium oxide (MAG-OX) 400 MG tablet Take 1 tablet by mouth 2 (two) times daily.     metoprolol succinate (TOPROL-XL) 25 MG 24 hr tablet Take 25 mg by mouth daily.     metoprolol tartrate (LOPRESSOR) 25 MG tablet Take 1 tablet (25 mg total) by mouth 2 (two) times daily. 60 tablet 0   Multiple Vitamins-Minerals (MULTIVITAMIN ADULT PO) Take  1 tablet by mouth 1 day or 1 dose.     pantoprazole (PROTONIX) 40 MG tablet Take 40 mg by mouth daily.      sertraline (ZOLOFT) 50 MG tablet Take 50 mg by mouth at bedtime.     fluticasone-salmeterol (ADVAIR HFA) 115-21 MCG/ACT inhaler Inhale 2 puffs into the lungs 2 (two) times daily.     montelukast (SINGULAIR) 10 MG tablet Take 10 mg by mouth at bedtime.     No current facility-administered medications for this visit.     Allergies:   Augmentin [amoxicillin-pot clavulanate], Celebrex [celecoxib], and Oxycodone   Social History:  The patient  reports that he has quit smoking. His smoking use included cigarettes. He has a 40.00 pack-year smoking history. He has never used smokeless tobacco. He reports that he does not drink alcohol or use drugs.   Family History:  The patient's family history includes Breast cancer in his mother; Cancer in his father; Hypertension in his mother; Lung cancer in his brother; Rheum arthritis in his father; Stroke in his paternal grandfather.    ROS:  Please see the history of present illness.   Otherwise, review of systems is positive for none.   All other systems are reviewed and negative.    PHYSICAL EXAM: VS:  BP 122/74    Pulse 77    Ht 5\' 11"  (1.803 m)    Wt 235 lb (106.6 kg)    SpO2 97%    BMI 32.78 kg/m  , BMI Body mass index is 32.78 kg/m. GEN: Well nourished, well developed, in no acute distress  HEENT: normal  Neck: no JVD, carotid bruits, or masses Cardiac: RRR; no murmurs, rubs, or gallops,no edema  Respiratory:  clear to auscultation bilaterally, normal work of breathing GI: soft, nontender, nondistended, + BS MS: no deformity or atrophy  Skin: warm and dry Neuro:  Strength and sensation are intact Psych: euthymic mood, full affect  EKG:  EKG is not ordered today. Personal review of the ekg ordered 12/14/18 shows sinus rhythm, left axis deviation, rate 78  Recent Labs: No results found for requested labs within last 8760 hours.     Lipid Panel  No results found for: CHOL, TRIG, HDL, CHOLHDL, VLDL, LDLCALC, LDLDIRECT   Wt Readings from Last 3 Encounters:  02/25/19 235 lb (106.6 kg)  12/14/18 233 lb (105.7 kg)  11/03/17 240 lb (108.9 kg)      Other studies Reviewed: Additional studies/ records that were reviewed today include: Monitor 02/06/19 personally reviewed  Review of the above records today demonstrates:  Normal sinus rhythm avg HR of 80 bpm. 86 Supraventricular Tachycardia runs occurred, the run with the fastest interval lasting 11 mins 19 secs with a max rate of 200 bpm,  the longest lasting 12 mins 30 secs with an avg rate of 147 bpm.  03/2017 Echo:  NORMAL LEFT VENTRICULAR SYSTOLIC FUNCTION   WITH  MILD LVH NORMAL RIGHT VENTRICULAR SYSTOLIC FUNCTION MILD VALVULAR REGURGITATION (See above) NO VALVULAR STENOSIS MILD TR, PR TRIVIAL MR EF 50%  ASSESSMENT AND PLAN:  1.  SVT: Only on metoprolol.  Is an average heart rate of 80 bpm and thus metoprolol dose could be increased.  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  I discussed with him further options of therapy including ablation versus medications.  Risks and benefits of ablation were discussed.  Risks include bleeding, tamponade, heart block, stroke, among others.  He understands these risks and is agreed to the procedure.  Case discussed with referring cardiologist  Current medicines are reviewed at length with the patient today.   The patient does not have concerns regarding his medicines.  The following changes were made today:  none  Labs/ tests ordered today include:  Orders Placed This Encounter  Procedures   Basic metabolic panel   CBC     Disposition:   FU with Amaurie Wandel 3 months  Signed, Mannie Ohlin Meredith Leeds, MD  02/25/2019 12:10 PM     [place] [address] [place] [address] [phone] (office) [phone] (fax)

## 2019-02-25 NOTE — Patient Instructions (Addendum)
Medication Instructions:  Your physician recommends that you continue on your current medications as directed. Please refer to the Current Medication list given to you today.  Labwork: None ordered  Testing/Procedures: Your physician has recommended that you have an ablation. Catheter ablation is a medical procedure used to treat some cardiac arrhythmias (irregular heartbeats). During catheter ablation, a long, thin, flexible tube is put into a blood vessel in your groin (upper thigh), or neck. This tube is called an ablation catheter. It is then guided to your heart through the blood vessel. Radio frequency waves destroy small areas of heart tissue where abnormal heartbeats may cause an arrhythmia to start.  Follow-Up: Your physician recommends that you schedule a follow-up appointment in: 4 weeks, after your procedure on 03/07/19, with Dr. Curt Bears.  * If you need a refill on your cardiac medications before your next appointment, please call your pharmacy.   Thank you for choosing [facility]!!   Trinidad Curet, RN [phone]  Any Other Special Instructions Will Be Listed Below (If Applicable).    Electrophysiology/Ablation Procedure Instructions   You are scheduled for a(n)  ablation on 03/07/19 with Dr. Allegra Lai.   1.   Pre procedure testing-             A.  LAB WORK --- On 02/25/19 for your pre procedure blood work.                 B. COVID TEST-- On 03/04/19 @ 11:30 at [facility].  -   Be sure to share with the checkpoint that you are there for pre-procedure/surgery testing.     2. On the day of your procedure 03/07/19 you will go to [facility] [phone]) at 8:00 am.  Dennis Bast will go to the main entrance [address] [employer]) and enter where the [employer] are.  Your driver will drop you off and you will head down the hallway to ADMITTING.  You may have one support person come in to the hospital with you.  They will be asked to wait in the waiting  room.   3.   Do not eat or drink after midnight prior to your procedure.   4.   Do NOT take any medications the morning of your procedure.   5.  Plan for an overnight stay.  If you use your phone frequently bring your phone charger.   * If you have ANY questions please call the office [phone] and ask for Ebubechukwu Jedlicka RN or send me a MyChart message   * Occasionally, EP Studies and ablations can become lengthy.  Please make your family aware of this before your procedure starts.  Average time ranges from 2-8 hours for EP studies/ablations.  Your physician will call your family after the procedure with the results.                                    Cardiac Ablation Cardiac ablation is a procedure to disable (ablate) a small amount of heart tissue in very specific places. The heart has many electrical connections. Sometimes these connections are abnormal and can cause the heart to beat very fast or irregularly. Ablating some of the problem areas can improve the heart rhythm or return it to normal. Ablation may be done for people who:  Have Wolff-Parkinson-White syndrome.  Have fast heart rhythms (tachycardia).  Have taken  medicines for an abnormal heart rhythm (arrhythmia) that were not effective or caused side effects.  Have a high-risk heartbeat that may be life-threatening.  During the procedure, a small incision is made in the neck or the groin, and a long, thin, flexible tube (catheter) is inserted into the incision and moved to the heart. Small devices (electrodes) on the tip of the catheter will send out electrical currents. A type of X-ray (fluoroscopy) will be used to help guide the catheter and to provide images of the heart. Tell a health care provider about:  Any allergies you have.  All medicines you are taking, including vitamins, herbs, eye drops, creams, and over-the-counter medicines.  Any problems you or family members have had with anesthetic medicines.  Any blood  disorders you have.  Any surgeries you have had.  Any medical conditions you have, such as kidney failure.  Whether you are pregnant or may be pregnant. What are the risks? Generally, this is a safe procedure. However, problems may occur, including:  Infection.  Bruising and bleeding at the catheter insertion site.  Bleeding into the chest, especially into the sac that surrounds the heart. This is a serious complication.  Stroke or blood clots.  Damage to other structures or organs.  Allergic reaction to medicines or dyes.  Need for a permanent pacemaker if the normal electrical system is damaged. A pacemaker is a small computer that sends electrical signals to the heart and helps your heart beat normally.  The procedure not being fully effective. This may not be recognized until months later. Repeat ablation procedures are sometimes required.  What happens before the procedure?  Follow instructions from your health care provider about eating or drinking restrictions.  Ask your health care provider about: ? Changing or stopping your regular medicines. This is especially important if you are taking diabetes medicines or blood thinners. ? Taking medicines such as aspirin and ibuprofen. These medicines can thin your blood. Do not take these medicines before your procedure if your health care provider instructs you not to.  Plan to have someone take you home from the hospital or clinic.  If you will be going home right after the procedure, plan to have someone with you for 24 hours. What happens during the procedure?  To lower your risk of infection: ? Your health care team will wash or sanitize their hands. ? Your skin will be washed with soap. ? Hair may be removed from the incision area.  An IV tube will be inserted into one of your veins.  You will be given a medicine to help you relax (sedative).  The skin on your neck or groin will be numbed.  An incision will be  made in your neck or your groin.  A needle will be inserted through the incision and into a large vein in your neck or groin.  A catheter will be inserted into the needle and moved to your heart.  Dye may be injected through the catheter to help your surgeon see the area of the heart that needs treatment.  Electrical currents will be sent from the catheter to ablate heart tissue in desired areas. There are three types of energy that may be used to ablate heart tissue: ? Heat (radiofrequency energy). ? Laser energy. ? Extreme cold (cryoablation).  When the necessary tissue has been ablated, the catheter will be removed.  Pressure will be held on the catheter insertion area to prevent excessive bleeding.  A bandage (dressing)  will be placed over the catheter insertion area. The procedure may vary among health care providers and hospitals. What happens after the procedure?  Your blood pressure, heart rate, breathing rate, and blood oxygen level will be monitored until the medicines you were given have worn off.  Your catheter insertion area will be monitored for bleeding. You will need to lie still for a few hours to ensure that you do not bleed from the catheter insertion area.  Do not drive for 5-7 days or as long as directed by your health care provider. Summary  Cardiac ablation is a procedure to disable (ablate) a small amount of heart tissue in very specific places. Ablating some of the problem areas can improve the heart rhythm or return it to normal.  During the procedure, electrical currents will be sent from the catheter to ablate heart tissue in desired areas. This information is not intended to replace advice given to you by your health care provider. Make sure you discuss any questions you have with your health care provider. Document Released: 08/10/2008 Document Revised: 02/11/2016 Document Reviewed: 02/11/2016 Elsevier Interactive Patient Education  [employer].

## 2019-02-28 IMAGING — CT CT ANGIO CHEST
2 of 6 series · 17 of 46 positions shown · IV contrast (APPLIED)
Comparison: Chest x-ray from earlier same day. Chest CT dated
07/01/2016.

CLINICAL DATA: Spontaneous pneumothorax. Intermittent tachycardia.
Elevated D-dimer. Severe chest discomfort.

EXAM:
CT ANGIOGRAPHY CHEST WITH CONTRAST
TECHNIQUE: Multidetector CT imaging of the chest was performed using the
standard protocol during bolus administration of intravenous
contrast. Multiplanar CT image reconstructions and MIPs were
obtained to evaluate the vascular anatomy.
CONTRAST:  75mL CXTDG3-C1U IOPAMIDOL (CXTDG3-C1U) INJECTION 76%

[Series 5: thins · axial · 0.89mm/px · z∈[-308,-55]mm · 14 of 279 slices shown]
[im 13/279  lung]
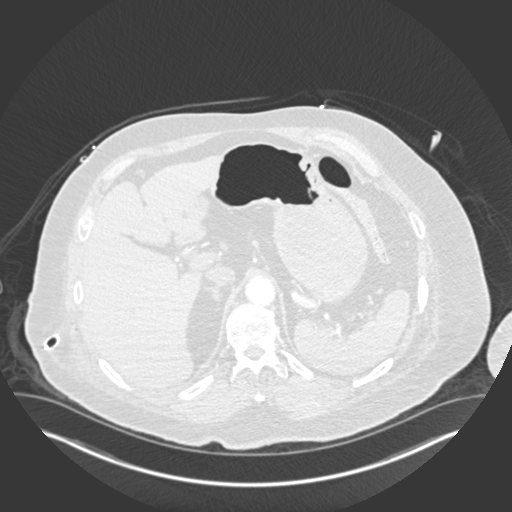
[im 37/279  soft-tissue]
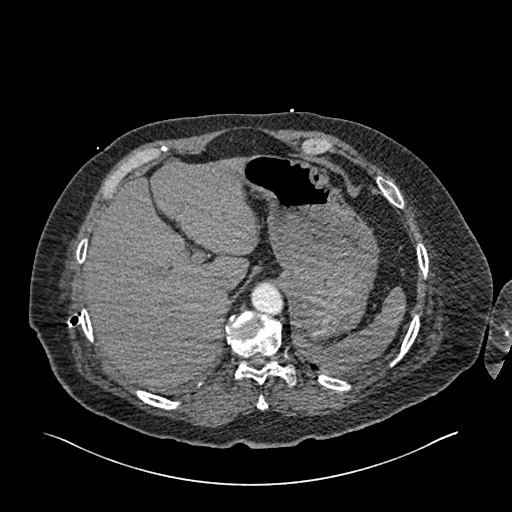
[im 49/279  lung]
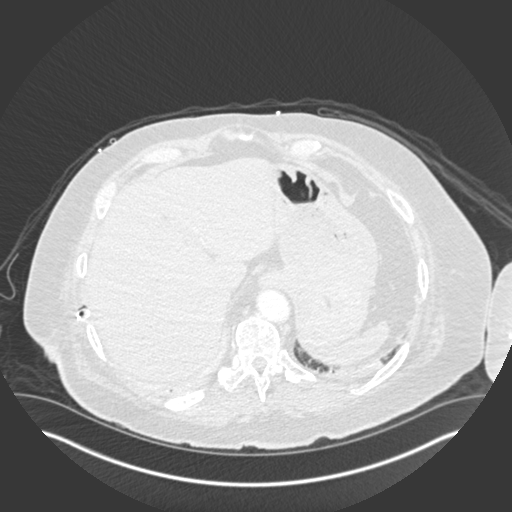
[im 73/279  soft-tissue]
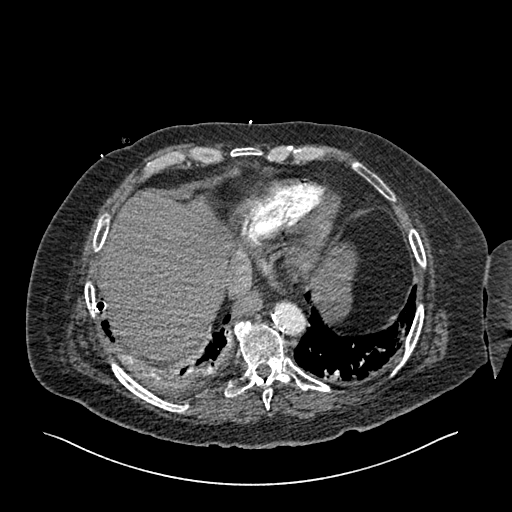
[im 97/279  lung]
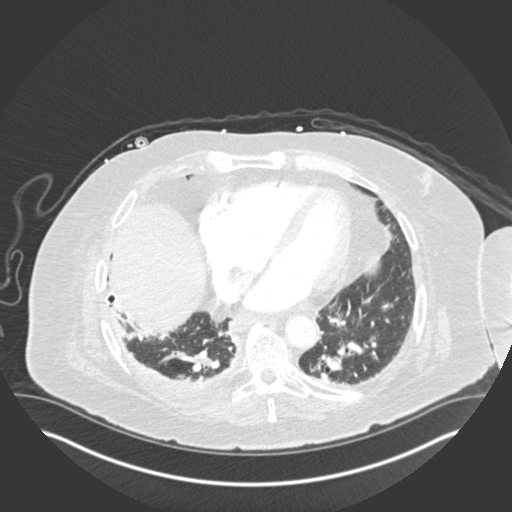
[im 109/279  soft-tissue]
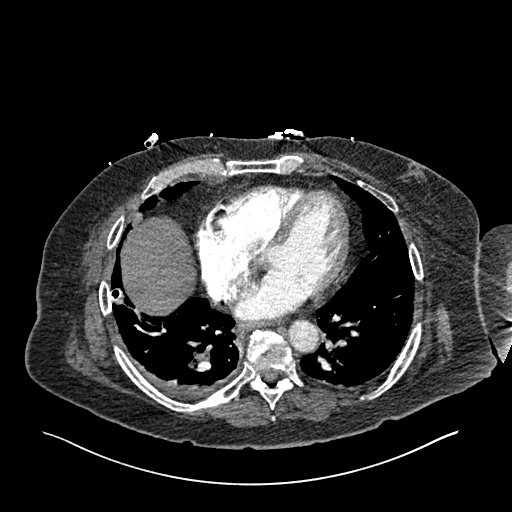
[im 133/279  lung]
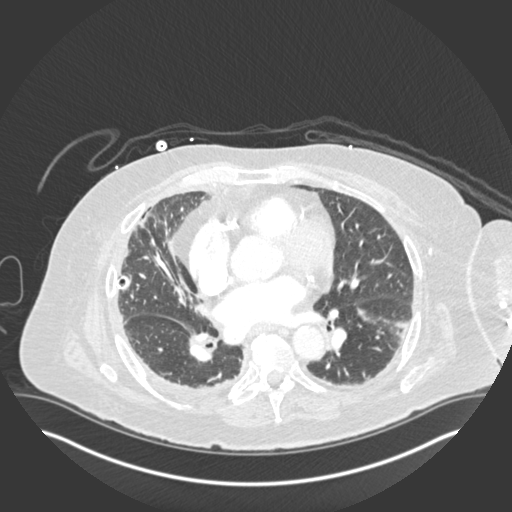
[im 146/279  soft-tissue]
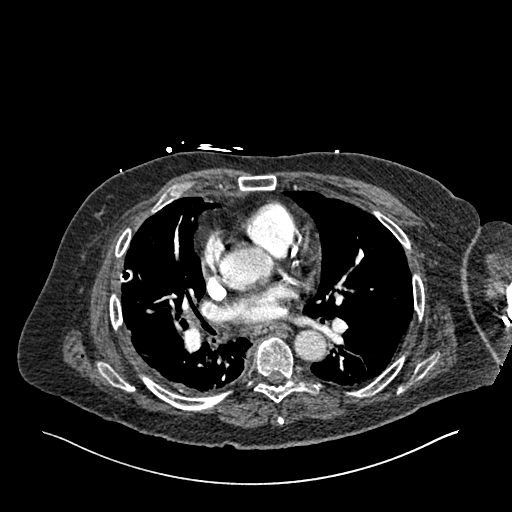
[im 170/279  lung]
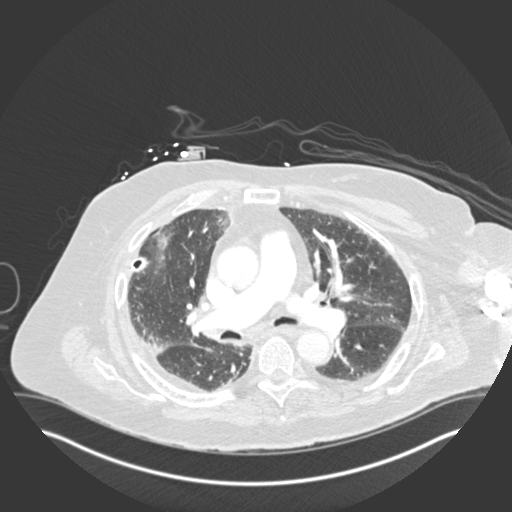
[im 182/279  soft-tissue]
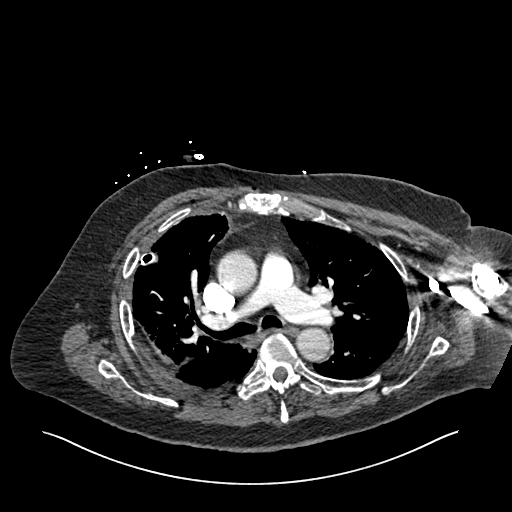
[im 206/279  lung]
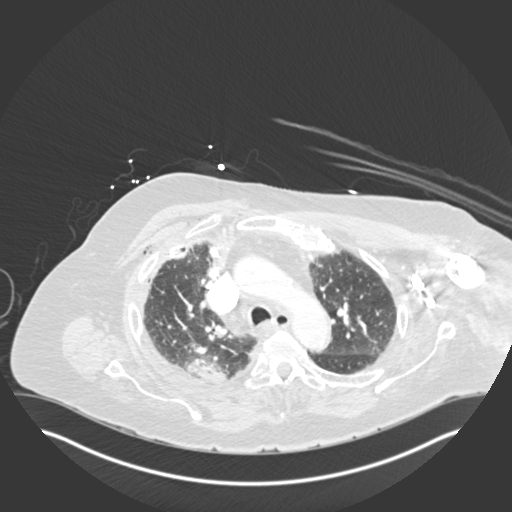
[im 230/279  soft-tissue]
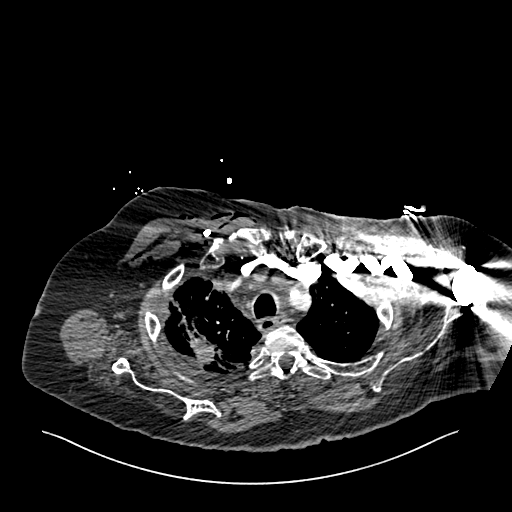
[im 242/279  lung]
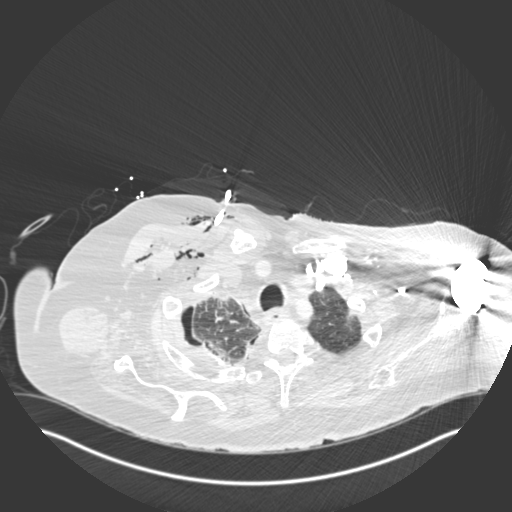
[im 266/279  soft-tissue]
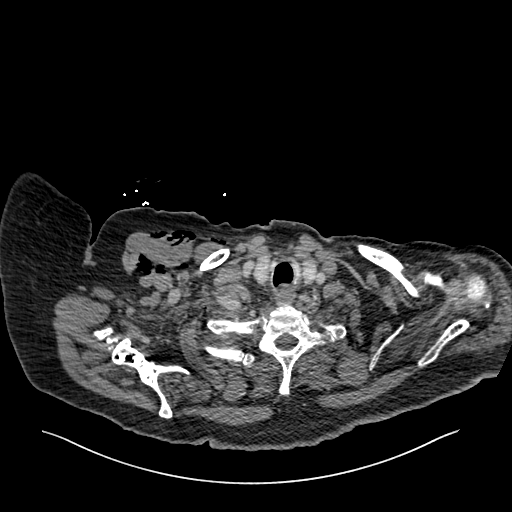

[Series 7: coronal mpr · coronal · 0.54mm/px · 3 of 94 slices shown]
[im 24/94  soft-tissue]
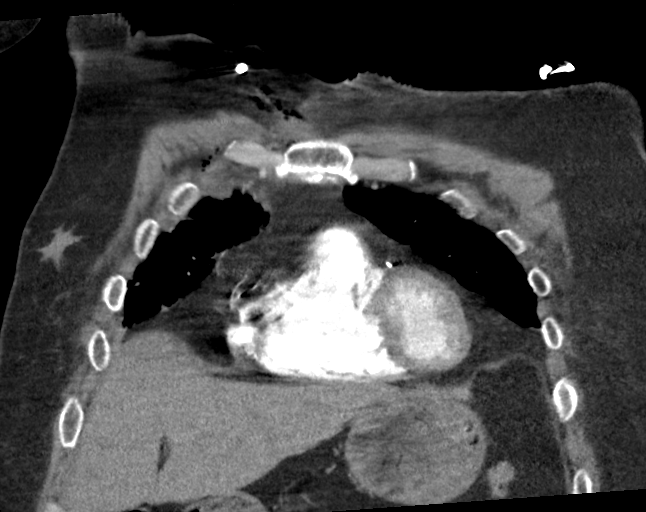
[im 47/94  soft-tissue]
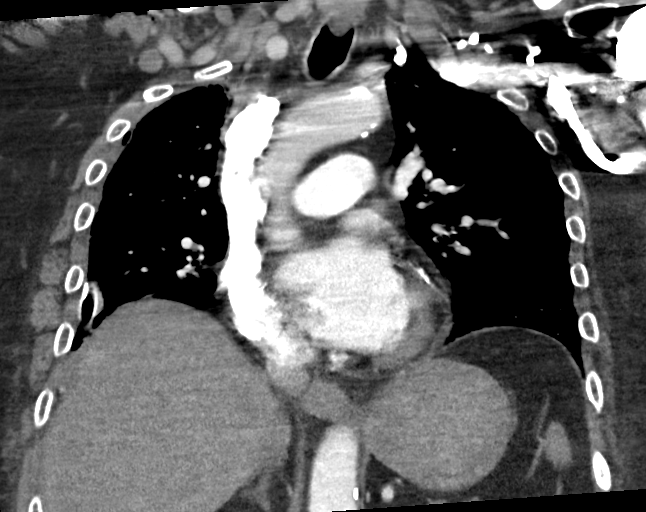
[im 70/94  soft-tissue]
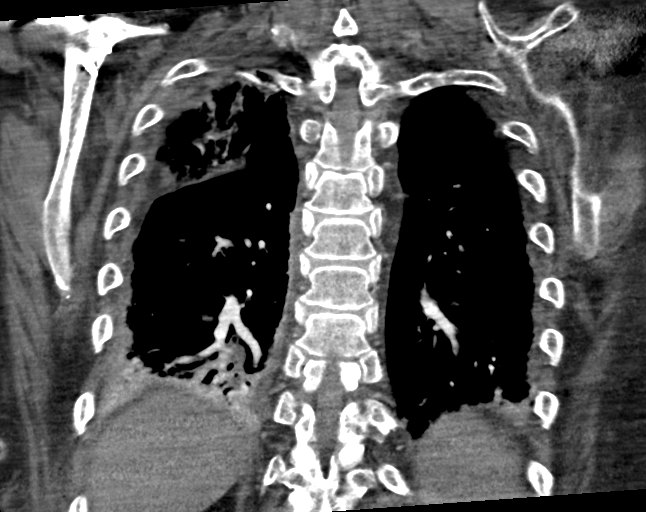

[17 of 46 positions shown; findings below may reference images not displayed]

FINDINGS: Cardiovascular: There is no pulmonary embolism identified within the
main, lobar or segmental pulmonary arteries bilaterally. No thoracic
aortic aneurysm or evidence of aortic dissection. Scattered aortic
atherosclerosis. Heart size is upper normal. No pericardial
effusion. Diffuse coronary artery calcifications.

Mediastinum/Nodes: No mass or enlarged lymph nodes seen within the
mediastinum or perihilar regions. Esophagus appears normal. Trachea
and central bronchi are unremarkable.

Lungs/Pleura: RIGHT-sided chest tube in place with tip directed
towards the anterior aspects of the RIGHT upper lobe. Trace residual
pneumothorax at the RIGHT lung apex.

Focal consolidation at the RIGHT lung apex, measuring approximately
4.3 by 1.7 cm, most likely contusion and/or atelectasis. Small RIGHT
pleural effusion. Patchy mild atelectasis within the RIGHT lung.
Minimal atelectasis at the LEFT lung base.

Upper Abdomen: No acute findings.

Musculoskeletal: No acute or suspicious osseous finding. Mild
degenerative spondylosis throughout the slightly scoliotic thoracic
spine.

Patchy mild subcutaneous emphysema within the upper RIGHT chest.
Probable bilateral gynecomastia.

Review of the MIP images confirms the above findings.
IMPRESSION: 1. No pulmonary embolism.
2. RIGHT-sided chest tube in place with tip directed towards the
RIGHT lung apex. Trace residual pneumothorax at the RIGHT lung apex.
Associated mild subcutaneous emphysema within the upper RIGHT chest
wall.
3. Focal dense consolidation at the RIGHT lung apex, measuring
approximately 4 cm greatest dimension, most likely contusion and/or
atelectasis.
4. Small RIGHT pleural effusion.  Mild bibasilar atelectasis.
5. Diffuse coronary artery calcifications. Recommend correlation
with any possible associated cardiac symptoms. Heart size is upper
normal.

Aortic Atherosclerosis ([id number]-B47.7).

## 2019-03-01 DIAGNOSIS — M5416 Radiculopathy, lumbar region: Secondary | ICD-10-CM | POA: Diagnosis not present

## 2019-03-01 DIAGNOSIS — M5116 Intervertebral disc disorders with radiculopathy, lumbar region: Secondary | ICD-10-CM | POA: Diagnosis not present

## 2019-03-01 DIAGNOSIS — M48061 Spinal stenosis, lumbar region without neurogenic claudication: Secondary | ICD-10-CM | POA: Diagnosis not present

## 2019-03-01 DIAGNOSIS — M545 Low back pain: Secondary | ICD-10-CM | POA: Diagnosis not present

## 2019-03-02 IMAGING — CR DG CHEST 2V
1 series · 2 of 2 positions shown · non-contrast
Comparison: 08/20/2017 and earlier.

CLINICAL DATA: 72-year-old male with spontaneous pneumothorax.
Postop day 3 right thoracoscopy with apical bleb resection and talc
pleurodesis. Right chest tube in place.

EXAM:
CHEST - 2 VIEW

[Series 1: dg chest 2 view · 0.14mm/px · 2 of 2 slices shown]
[im 1/2]
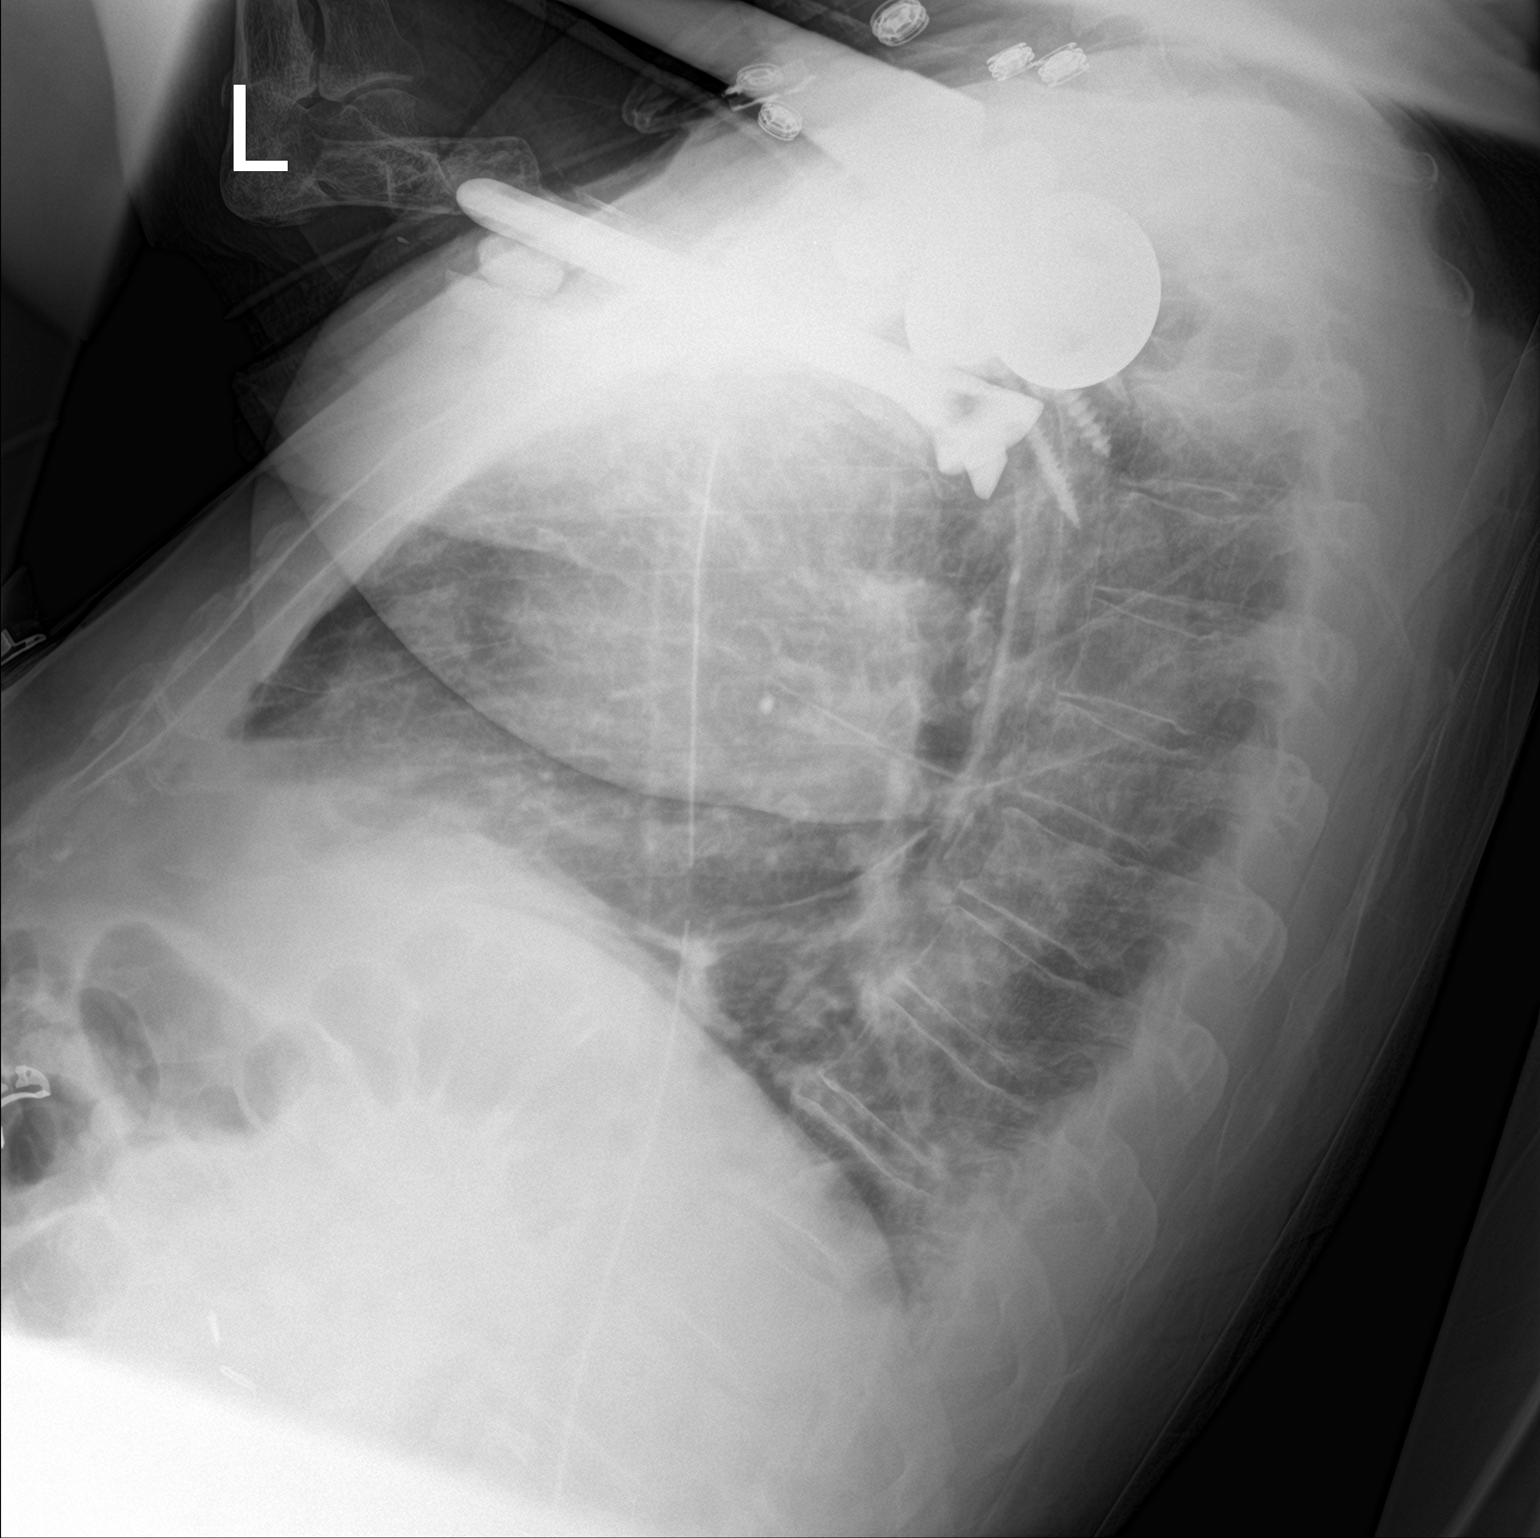
[im 2/2]
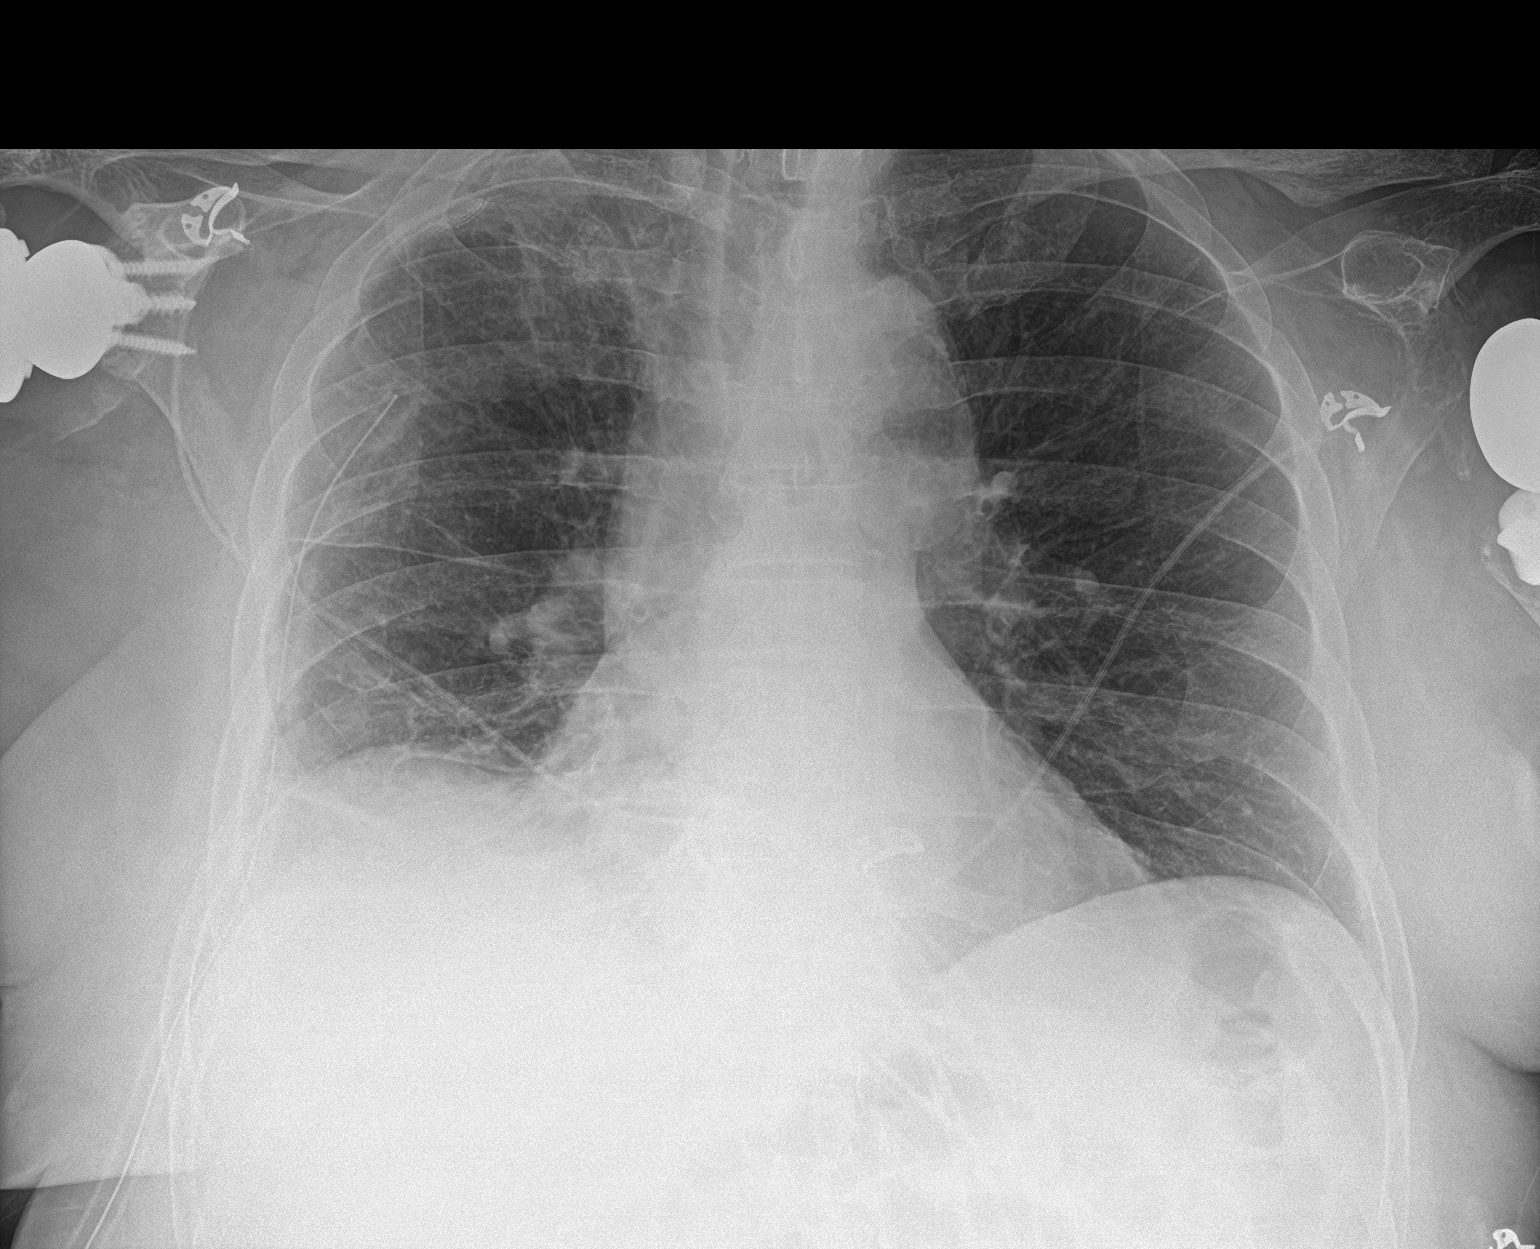

[2 of 2 positions shown; findings below may reference images not displayed]

FINDINGS: Stable right chest tube. Staple line along the peripheral right
upper lobe. Trace if any residual pneumothorax, stable since
yesterday. Mildly improved lung volumes. Tortuous thoracic aortic
arch. Stable mediastinal contours. Mild residual right lung base
opacity is stable. No new pulmonary opacity. Bilateral shoulder
arthroplasty. Stable visualized osseous structures. Negative visible
bowel gas pattern.
IMPRESSION: 1. Postoperative changes to the right upper lobe and stable right
chest tube. Trace if any right apical pneumothorax is stable.
2. No new cardiopulmonary abnormality.

## 2019-03-04 ENCOUNTER — Other Ambulatory Visit
Admission: RE | Admit: 2019-03-04 | Discharge: 2019-03-04 | Disposition: A | Discharge status: Home / Self Care | Payer: PPO | Source: Ambulatory Visit | Attending: Cardiology | Admitting: Cardiology

## 2019-03-04 DIAGNOSIS — Z20828 Contact with and (suspected) exposure to other viral communicable diseases: Secondary | ICD-10-CM | POA: Insufficient documentation

## 2019-03-04 DIAGNOSIS — Z01812 Encounter for preprocedural laboratory examination: Secondary | ICD-10-CM | POA: Diagnosis not present

## 2019-03-04 LAB — SARS CORONAVIRUS 2 (TAT 6-24 HRS): SARS Coronavirus 2: NEGATIVE

## 2019-03-07 ENCOUNTER — Encounter (HOSPITAL_COMMUNITY)
Admission: RE | Disposition: A | Discharge status: Home / Self Care | Payer: Self-pay | Source: Home / Self Care | Attending: Cardiology

## 2019-03-07 ENCOUNTER — Other Ambulatory Visit: Payer: Self-pay

## 2019-03-07 ENCOUNTER — Ambulatory Visit (HOSPITAL_COMMUNITY)
Admission: RE | Admit: 2019-03-07 | Discharge: 2019-03-07 | Disposition: A | Discharge status: Home / Self Care | Payer: PPO | Attending: Cardiology | Admitting: Cardiology

## 2019-03-07 DIAGNOSIS — Z87891 Personal history of nicotine dependence: Secondary | ICD-10-CM | POA: Diagnosis not present

## 2019-03-07 DIAGNOSIS — I120 Hypertensive chronic kidney disease with stage 5 chronic kidney disease or end stage renal disease: Secondary | ICD-10-CM | POA: Diagnosis not present

## 2019-03-07 DIAGNOSIS — I471 Supraventricular tachycardia: Secondary | ICD-10-CM

## 2019-03-07 DIAGNOSIS — Z1211 Encounter for screening for malignant neoplasm of colon: Secondary | ICD-10-CM | POA: Diagnosis not present

## 2019-03-07 DIAGNOSIS — Z8572 Personal history of non-Hodgkin lymphomas: Secondary | ICD-10-CM | POA: Insufficient documentation

## 2019-03-07 DIAGNOSIS — D649 Anemia, unspecified: Secondary | ICD-10-CM | POA: Diagnosis not present

## 2019-03-07 DIAGNOSIS — K219 Gastro-esophageal reflux disease without esophagitis: Secondary | ICD-10-CM | POA: Insufficient documentation

## 2019-03-07 DIAGNOSIS — Z79899 Other long term (current) drug therapy: Secondary | ICD-10-CM | POA: Diagnosis not present

## 2019-03-07 DIAGNOSIS — F419 Anxiety disorder, unspecified: Secondary | ICD-10-CM | POA: Insufficient documentation

## 2019-03-07 DIAGNOSIS — G4730 Sleep apnea, unspecified: Secondary | ICD-10-CM | POA: Insufficient documentation

## 2019-03-07 DIAGNOSIS — F329 Major depressive disorder, single episode, unspecified: Secondary | ICD-10-CM | POA: Insufficient documentation

## 2019-03-07 DIAGNOSIS — N183 Chronic kidney disease, stage 3 unspecified: Secondary | ICD-10-CM | POA: Diagnosis not present

## 2019-03-07 DIAGNOSIS — Z7982 Long term (current) use of aspirin: Secondary | ICD-10-CM | POA: Diagnosis not present

## 2019-03-07 HISTORY — PX: SVT ABLATION: EP1225

## 2019-03-07 LAB — CBC
HCT: 33.5 % — ABNORMAL LOW (ref 39.0–52.0)
Hemoglobin: 11 g/dL — ABNORMAL LOW (ref 13.0–17.0)
MCH: 31.6 pg (ref 26.0–34.0)
MCHC: 32.8 g/dL (ref 30.0–36.0)
MCV: 96.3 fL (ref 80.0–100.0)
Platelets: 231 10*3/uL (ref 150–400)
RBC: 3.48 MIL/uL — ABNORMAL LOW (ref 4.22–5.81)
RDW: 12.8 % (ref 11.5–15.5)
WBC: 4 10*3/uL (ref 4.0–10.5)
nRBC: 0 % (ref 0.0–0.2)

## 2019-03-07 LAB — BASIC METABOLIC PANEL
Anion gap: 8 (ref 5–15)
BUN: 32 mg/dL — ABNORMAL HIGH (ref 8–23)
CO2: 23 mmol/L (ref 22–32)
Calcium: 9.2 mg/dL (ref 8.9–10.3)
Chloride: 108 mmol/L (ref 98–111)
Creatinine, Ser: 1.77 mg/dL — ABNORMAL HIGH (ref 0.61–1.24)
GFR calc Af Amer: 43 mL/min — ABNORMAL LOW (ref >60)
GFR calc non Af Amer: 37 mL/min — ABNORMAL LOW (ref >60)
Glucose, Bld: 93 mg/dL (ref 70–99)
Potassium: 4.7 mmol/L (ref 3.5–5.1)
Sodium: 139 mmol/L (ref 135–145)

## 2019-03-07 SURGERY — SVT ABLATION

## 2019-03-07 MED ORDER — FENTANYL CITRATE (PF) 100 MCG/2ML IJ SOLN
INTRAMUSCULAR | Status: DC | PRN
  Administered 2019-03-07 (×3): 25 ug via INTRAVENOUS

## 2019-03-07 MED ORDER — HEPARIN (PORCINE) IN NACL 1000-0.9 UT/500ML-% IV SOLN
INTRAVENOUS | Status: DC | PRN
  Administered 2019-03-07 (×2): 500 mL

## 2019-03-07 MED ORDER — SODIUM CHLORIDE 0.9% FLUSH: 3.0000 mL | Freq: Two times a day (BID) | INTRAVENOUS | Status: DC

## 2019-03-07 MED ORDER — FENTANYL CITRATE (PF) 100 MCG/2ML IJ SOLN
INTRAMUSCULAR | Status: AC
  Filled 2019-03-07: qty 2

## 2019-03-07 MED ORDER — SODIUM CHLORIDE 0.9% FLUSH: 3.0000 mL | INTRAVENOUS | Status: DC | PRN

## 2019-03-07 MED ORDER — ONDANSETRON HCL 4 MG/2ML IJ SOLN: 4.0000 mg | Freq: Four times a day (QID) | INTRAMUSCULAR | Status: DC | PRN

## 2019-03-07 MED ORDER — BUPIVACAINE HCL (PF) 0.25 % IJ SOLN
INTRAMUSCULAR | Status: AC
  Filled 2019-03-07: qty 60

## 2019-03-07 MED ORDER — SODIUM CHLORIDE 0.9 % IV SOLN
INTRAVENOUS | Status: DC
  Administered 2019-03-07: 09:00:00 via INTRAVENOUS

## 2019-03-07 MED ORDER — ISOPROTERENOL HCL 0.2 MG/ML IJ SOLN
INTRAMUSCULAR | Status: AC
  Filled 2019-03-07: qty 5

## 2019-03-07 MED ORDER — SODIUM CHLORIDE 0.9 % IV SOLN: 250.0000 mL | INTRAVENOUS | Status: DC | PRN

## 2019-03-07 MED ORDER — ACETAMINOPHEN 325 MG PO TABS
650.0000 mg | ORAL_TABLET | ORAL | Status: DC | PRN
  Filled 2019-03-07: qty 2

## 2019-03-07 MED ORDER — HEPARIN (PORCINE) IN NACL 1000-0.9 UT/500ML-% IV SOLN
INTRAVENOUS | Status: AC
  Filled 2019-03-07: qty 500

## 2019-03-07 MED ORDER — BUPIVACAINE HCL (PF) 0.25 % IJ SOLN
INTRAMUSCULAR | Status: DC | PRN
  Administered 2019-03-07: 45 mL

## 2019-03-07 MED ORDER — ISOPROTERENOL HCL 0.2 MG/ML IJ SOLN
INTRAVENOUS | Status: DC | PRN
  Administered 2019-03-07: 2 ug/min via INTRAVENOUS

## 2019-03-07 MED ORDER — MIDAZOLAM HCL 5 MG/5ML IJ SOLN
INTRAMUSCULAR | Status: AC
  Filled 2019-03-07: qty 5

## 2019-03-07 MED ORDER — MIDAZOLAM HCL 5 MG/5ML IJ SOLN
INTRAMUSCULAR | Status: DC | PRN
  Administered 2019-03-07 (×3): 1 mg via INTRAVENOUS
  Administered 2019-03-07: 2 mg via INTRAVENOUS

## 2019-03-07 SURGICAL SUPPLY — 12 items
BAG SNAP BAND KOVER 36X36 (MISCELLANEOUS) ×3 IMPLANT
CATH EZ STEER NAV 4MM F-J CUR (ABLATOR) ×3 IMPLANT
CATH JOSEPH QUAD ALLRED 6F REP (CATHETERS) ×6 IMPLANT
CATH WEBSTER BI DIR CS D-F CRV (CATHETERS) ×3 IMPLANT
PACK EP LATEX FREE (CUSTOM PROCEDURE TRAY) ×2
PACK EP LF (CUSTOM PROCEDURE TRAY) ×1 IMPLANT
PAD PRO RADIOLUCENT 2001M-C (PAD) ×3 IMPLANT
PATCH CARTO3 (PAD) ×3 IMPLANT
SHEATH PINNACLE 6F 10CM (SHEATH) ×6 IMPLANT
SHEATH PINNACLE 7F 10CM (SHEATH) ×3 IMPLANT
SHEATH PINNACLE 8F 10CM (SHEATH) ×3 IMPLANT
SHEATH PROBE COVER 6X72 (BAG) ×3 IMPLANT

## 2019-03-07 NOTE — H&P (Signed)
Gavin Maxwell has presented today for surgery, with the diagnosis of SVT.  The various methods of treatment have been discussed with the patient and family. After consideration of risks, benefits and other options for treatment, the patient has consented to  Procedure(s): Catheter ablation as a surgical intervention .  Risks include but not limited to bleeding, tamponade, heart block, stroke, damage to surrounding organs, among others. The patient's history has been reviewed, patient examined, no change in status, stable for surgery.  I have reviewed the patient's chart and labs.  Questions were answered to the patient's satisfaction.    Carla Rashad Curt Bears, MD 03/07/2019 9:32 AM

## 2019-03-07 NOTE — Discharge Instructions (Signed)

## 2019-03-07 NOTE — Progress Notes (Signed)
Voided 400 cc of amber urine without difficulty. To room 2 in [facility] with RN. Tele, bilateral groins in stable condition without hematoma, ecchymosis, or bleeding.

## 2019-03-07 NOTE — Progress Notes (Signed)
6 fr and 8 fr rfv sheath removed with manual pressure held x 150 min and hemostasis achieved .  A dry sterile dressing applied to rfv- pt given instructions if he laughs, cough, sneezed, bears down to hold pressure to rfv- pt hand guided with verbal acknowledgement and repeated statements.

## 2019-03-07 NOTE — Progress Notes (Signed)
6 fr and 7 fr lfv sheath removed with manual pressure held x 150 min and hemostasis achieved .  A dry sterile dressing applied to lfv- pt given instructions if he laughs, cough, sneezed, bears down to hold pressure to rfv- pt hand guided with verbal acknowledgement and repeated statements.

## 2019-03-08 ENCOUNTER — Encounter (HOSPITAL_COMMUNITY): Payer: Self-pay | Admitting: Cardiology

## 2019-03-17 DIAGNOSIS — M48061 Spinal stenosis, lumbar region without neurogenic claudication: Secondary | ICD-10-CM | POA: Diagnosis not present

## 2019-03-17 DIAGNOSIS — M5416 Radiculopathy, lumbar region: Secondary | ICD-10-CM | POA: Diagnosis not present

## 2019-03-17 DIAGNOSIS — M5116 Intervertebral disc disorders with radiculopathy, lumbar region: Secondary | ICD-10-CM | POA: Diagnosis not present

## 2019-03-17 DIAGNOSIS — M545 Low back pain: Secondary | ICD-10-CM | POA: Diagnosis not present

## 2019-03-25 ENCOUNTER — Ambulatory Visit
Admission: RE | Admit: 2019-03-25 | Discharge: 2019-03-25 | Disposition: A | Discharge status: Home / Self Care | Payer: PPO | Source: Ambulatory Visit | Attending: Internal Medicine | Admitting: Internal Medicine

## 2019-03-25 ENCOUNTER — Other Ambulatory Visit: Payer: Self-pay

## 2019-03-25 DIAGNOSIS — C8590 Non-Hodgkin lymphoma, unspecified, unspecified site: Secondary | ICD-10-CM

## 2019-03-28 DIAGNOSIS — M1611 Unilateral primary osteoarthritis, right hip: Secondary | ICD-10-CM | POA: Diagnosis not present

## 2019-03-28 DIAGNOSIS — M545 Low back pain: Secondary | ICD-10-CM | POA: Diagnosis not present

## 2019-03-28 DIAGNOSIS — M25551 Pain in right hip: Secondary | ICD-10-CM | POA: Diagnosis not present

## 2019-04-11 DIAGNOSIS — M1611 Unilateral primary osteoarthritis, right hip: Secondary | ICD-10-CM | POA: Diagnosis not present

## 2019-04-11 DIAGNOSIS — M25551 Pain in right hip: Secondary | ICD-10-CM | POA: Diagnosis not present

## 2019-04-12 ENCOUNTER — Ambulatory Visit: Payer: PPO | Admitting: Cardiology

## 2019-04-12 ENCOUNTER — Encounter: Payer: Self-pay | Admitting: Cardiology

## 2019-04-12 ENCOUNTER — Other Ambulatory Visit: Payer: Self-pay

## 2019-04-12 VITALS — BP 106/58 | HR 52 | Ht 71.0 in | Wt 243.2 lb

## 2019-04-12 DIAGNOSIS — I471 Supraventricular tachycardia: Secondary | ICD-10-CM

## 2019-04-12 NOTE — Progress Notes (Signed)
Electrophysiology Office Note   Date:  04/12/2019   ID:  Gavin Maxwell, DOB 19-Jul-1945, MRN [id number]  PCP:  Idelle Crouch, MD  Cardiologist:  Rockey Situ Primary Electrophysiologist:  Asheton Scheffler Meredith Leeds, MD    Chief Complaint: SVT   History of Present Illness: Gavin Maxwell is a 74 y.o. male who is being seen today for the evaluation of SVT at the request of Idelle Crouch, MD. Presenting today for electrophysiology evaluation.  He has a history of non-Hodgkin's lymphoma, chronic anemia, hypertension, stage III CKD.  He presented to cardiology clinic for syncope, palpitations, and tachycardia.  He is currently on beta-blockers for his intermittent episodes of SVT.  He did have a spontaneous pneumothorax May 2019.  He does have rare episodes of syncope.  They occur mostly when sitting down.  He wore a cardiac monitor that showed episodes of SVT that occur at the time of his episodes of near syncope.  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  Today, denies symptoms of palpitations, chest pain, shortness of breath, orthopnea, PND, lower extremity edema, claudication, dizziness, presyncope, syncope, bleeding, or neurologic sequela. The patient is tolerating medications without difficulties.  He has noted no further episodes of SVT since his ablation.  He is also had no further episodes of syncope.   Past Medical History:  Diagnosis Date  . Anxiety   . Cancer ([place])    non hodgins  lymphoma  . Depression   . GERD (gastroesophageal reflux disease)   . History of hiatal hernia   . Hypertension   . Primary localized osteoarthritis of knee 09/26/2014  . Primary localized osteoarthrosis, shoulder region, rotator cuff arthropathy 10/04/2013  . Sleep apnea    cpap   Past Surgical History:  Procedure Laterality Date  . CARDIAC CATHETERIZATION     20 yrs. ago  . CHOLECYSTECTOMY    . EYE SURGERY    . JOINT REPLACEMENT     hip  . NASAL SINUS SURGERY     x2  . REVERSE  SHOULDER ARTHROPLASTY Right 10/04/2013   Procedure: REVERSE SHOULDER ARTHROPLASTY;  Surgeon: Johnny Bridge, MD;  Location: [facility];  Service: Orthopedics;  Laterality: Right;  . SHOULDER ACROMIOPLASTY     x 5 shoulder surgeries  . SVT ABLATION N/A 03/07/2019   Procedure: SVT ABLATION;  Surgeon: Constance Haw, MD;  Location: [facility];  Service: Cardiovascular;  Laterality: N/A;  . TOTAL KNEE ARTHROPLASTY Right 09/26/2014  . TOTAL KNEE ARTHROPLASTY Right 09/26/2014   Procedure: RIGHT TOTAL KNEE ARTHROPLASTY;  Surgeon: Marchia Bond, MD;  Location: [facility];  Service: Orthopedics;  Laterality: Right;  Marland Kitchen VIDEO ASSISTED THORACOSCOPY (VATS) W/TALC PLEUADESIS Right 08/18/2017   Procedure: VIDEO ASSISTED THORACOSCOPY (VATS)POSSIBLE  W/TALC PLEUADESIS.POSSIBLE BLEBECTOMY;  Surgeon: Nestor Lewandowsky, MD;  Location: [facility];  Service: Thoracic;  Laterality: Right;     Current Outpatient Medications  Medication Sig Dispense Refill  . albuterol (PROVENTIL HFA;VENTOLIN HFA) 108 (90 Base) MCG/ACT inhaler Inhale 2 puffs into the lungs every 6 (six) hours as needed for wheezing or shortness of breath. 1 Inhaler 3  . aspirin EC 81 MG tablet Take 81 mg by mouth daily.     Marland Kitchen atorvastatin (LIPITOR) 80 MG tablet Take 80 mg by mouth daily with supper.    . Calcium Carbonate (CALCIUM 600 PO) Take 600 mg by mouth 2 (two) times daily.     . fenofibrate 160 MG tablet Take 160 mg by mouth daily.     Marland Kitchen  ferrous sulfate (IRON SUPPLEMENT) 325 (65 FE) MG tablet Take 325 mg by mouth daily.    . fluticasone (FLONASE) 50 MCG/ACT nasal spray Place 2 sprays into both nostrils daily.     . fluticasone-salmeterol (ADVAIR HFA) 115-21 MCG/ACT inhaler Inhale 2 puffs into the lungs 2 (two) times daily.    Marland Kitchen gabapentin (NEURONTIN) 300 MG capsule Take 300 mg by mouth 2 (two) times daily.    Marland Kitchen HYDROcodone-acetaminophen (NORCO/VICODIN) 5-325 MG tablet Take 1 tablet by mouth every 6 (six) hours as needed for moderate pain. 12  tablet 0  . magnesium oxide (MAG-OX) 400 MG tablet Take 400 mg by mouth 2 (two) times daily.     . metoprolol succinate (TOPROL-XL) 25 MG 24 hr tablet Take 25 mg by mouth every evening.     . Multiple Vitamin (MULTIVITAMIN WITH MINERALS) TABS tablet Take 1 tablet by mouth daily.    . pantoprazole (PROTONIX) 40 MG tablet Take 40 mg by mouth daily.     . sertraline (ZOLOFT) 50 MG tablet Take 50 mg by mouth at bedtime.     No current facility-administered medications for this visit.    Allergies:   Augmentin [amoxicillin-pot clavulanate], Celebrex [celecoxib], and Oxycodone   Social History:  The patient  reports that he has quit smoking. His smoking use included cigarettes. He has a 40.00 pack-year smoking history. He has never used smokeless tobacco. He reports that he does not drink alcohol or use drugs.   Family History:  The patient's family history includes Breast cancer in his mother; Cancer in his father; Hypertension in his mother; Lung cancer in his brother; Rheum arthritis in his father; Stroke in his paternal grandfather.    ROS:  Please see the history of present illness.   Otherwise, review of systems is positive for none.   All other systems are reviewed and negative.   PHYSICAL EXAM: VS:  BP (!) 106/58   Pulse (!) 52   Ht 5\' 11"  (1.803 m)   Wt 243 lb 3.2 oz (110.3 kg)   SpO2 99%   BMI 33.92 kg/m  , BMI Body mass index is 33.92 kg/m. GEN: Well nourished, well developed, in no acute distress  HEENT: normal  Neck: no JVD, carotid bruits, or masses Cardiac: RRR; no murmurs, rubs, or gallops,no edema  Respiratory:  clear to auscultation bilaterally, normal work of breathing GI: soft, nontender, nondistended, + BS MS: no deformity or atrophy  Skin: warm and dry Neuro:  Strength and sensation are intact Psych: euthymic mood, full affect  EKG:  EKG is ordered today. Personal review of the ekg ordered shows sinus rhythm, rate 52  Recent Labs: 03/07/2019: BUN 32;  Creatinine, Ser 1.77; Hemoglobin 11.0; Platelets 231; Potassium 4.7; Sodium 139    Lipid Panel  No results found for: CHOL, TRIG, HDL, CHOLHDL, VLDL, LDLCALC, LDLDIRECT   Wt Readings from Last 3 Encounters:  04/12/19 243 lb 3.2 oz (110.3 kg)  03/07/19 235 lb (106.6 kg)  02/25/19 235 lb (106.6 kg)      Other studies Reviewed: Additional studies/ records that were reviewed today include: Monitor 02/06/19 personally reviewed  Review of the above records today demonstrates:  Normal sinus rhythm avg HR of 80 bpm. 86 Supraventricular Tachycardia runs occurred, the run with the fastest interval lasting 11 mins 19 secs with a max rate of 200 bpm,  the longest lasting 12 mins 30 secs with an avg rate of 147 bpm.  03/2017 Echo:  NORMAL LEFT VENTRICULAR SYSTOLIC  FUNCTION   WITH MILD LVH NORMAL RIGHT VENTRICULAR SYSTOLIC FUNCTION MILD VALVULAR REGURGITATION (See above) NO VALVULAR STENOSIS MILD TR, PR TRIVIAL MR EF 50%  ASSESSMENT AND PLAN:  1.  Status post ablation 03/08/2019.  He has had no further episodes of SVT.  We Gavin Maxwell continue his current medications at his current doses, but we Gavin Maxwell make no changes.  We Gavin Maxwell see him back on an as-needed basis.   Current medicines are reviewed at length with the patient today.   The patient does not have concerns regarding his medicines.  The following changes were made today: None  Labs/ tests ordered today include:  Orders Placed This Encounter  Procedures  . EKG 12-Lead     Disposition:   FU with Gavin Maxwell as needed months  Signed, Xylan Sheils Meredith Leeds, MD  04/12/2019 3:23 PM     [facility] [address] [place] [address] [phone] (office) [phone] (fax)

## 2019-04-15 ENCOUNTER — Other Ambulatory Visit: Payer: Self-pay

## 2019-04-15 ENCOUNTER — Encounter: Payer: Self-pay | Admitting: Urology

## 2019-04-15 ENCOUNTER — Ambulatory Visit: Payer: PPO | Admitting: Urology

## 2019-04-15 VITALS — BP 122/76 | HR 79 | Ht 71.0 in | Wt 239.2 lb

## 2019-04-15 DIAGNOSIS — R310 Gross hematuria: Secondary | ICD-10-CM

## 2019-04-15 LAB — URINALYSIS, COMPLETE
Bilirubin, UA: NEGATIVE
Glucose, UA: NEGATIVE
Ketones, UA: NEGATIVE
Leukocytes,UA: NEGATIVE
Nitrite, UA: NEGATIVE
Protein,UA: NEGATIVE
RBC, UA: NEGATIVE
Specific Gravity, UA: 1.02 (ref 1.005–1.030)
Urobilinogen, Ur: 0.2 mg/dL (ref 0.2–1.0)
pH, UA: 5.5 (ref 5.0–7.5)

## 2019-04-15 LAB — MICROSCOPIC EXAMINATION
Bacteria, UA: NONE SEEN
RBC, Urine: NONE SEEN /hpf (ref 0–2)
WBC, UA: NONE SEEN /hpf (ref 0–5)

## 2019-04-15 NOTE — Progress Notes (Signed)
04/15/2019 8:29 AM   Gavin Maxwell 1945-12-30 [id number]  Referring provider: Idelle Crouch, MD [facility] [place],  [place] [address]  Chief Complaint  Patient presents with  . Hematuria    HPI: Gavin Maxwell is a 74 y.o. male seen at request of Dr. Doy Hutching for evaluation of gross hematuria.  In November 2020 he had an episode of total gross painless hematuria.  He states it was present for 3 voids then resolved.  He is on a baby aspirin daily but no other anticoagulation.  He had no dysuria or change in his lower urinary tract symptoms.  He has mild urinary frequency and urgency.  He states he saw a urologist several years ago but does not remember why he was seen.  He apparently had a cystoscopy at that visit.  His PSA has been checked regularly and is low at 0.07. No prior history prostate cancer.  He has chronic back pain with occasional radiation to the bilateral groin regions.  He had pyuria July 2020 and urine culture positive for Klebsiella.  He was asymptomatic and was treated with Cipro.   PMH: Past Medical History:  Diagnosis Date  . Anxiety   . Cancer ([place])    non hodgins  lymphoma  . Depression   . GERD (gastroesophageal reflux disease)   . History of hiatal hernia   . Hypertension   . Primary localized osteoarthritis of knee 09/26/2014  . Primary localized osteoarthrosis, shoulder region, rotator cuff arthropathy 10/04/2013  . Sleep apnea    cpap    Surgical History: Past Surgical History:  Procedure Laterality Date  . CARDIAC CATHETERIZATION     20 yrs. ago  . CHOLECYSTECTOMY    . EYE SURGERY    . JOINT REPLACEMENT     hip  . NASAL SINUS SURGERY     x2  . REVERSE SHOULDER ARTHROPLASTY Right 10/04/2013   Procedure: REVERSE SHOULDER ARTHROPLASTY;  Surgeon: Johnny Bridge, MD;  Location: [facility];  Service: Orthopedics;  Laterality: Right;  . SHOULDER ACROMIOPLASTY     x 5 shoulder surgeries  . SVT ABLATION N/A  03/07/2019   Procedure: SVT ABLATION;  Surgeon: Constance Haw, MD;  Location: [facility];  Service: Cardiovascular;  Laterality: N/A;  . TOTAL KNEE ARTHROPLASTY Right 09/26/2014  . TOTAL KNEE ARTHROPLASTY Right 09/26/2014   Procedure: RIGHT TOTAL KNEE ARTHROPLASTY;  Surgeon: Marchia Bond, MD;  Location: [facility];  Service: Orthopedics;  Laterality: Right;  Marland Kitchen VIDEO ASSISTED THORACOSCOPY (VATS) W/TALC PLEUADESIS Right 08/18/2017   Procedure: VIDEO ASSISTED THORACOSCOPY (VATS)POSSIBLE  W/TALC PLEUADESIS.POSSIBLE BLEBECTOMY;  Surgeon: Nestor Lewandowsky, MD;  Location: [facility];  Service: Thoracic;  Laterality: Right;    Home Medications:  Allergies as of 04/15/2019      Reactions   Augmentin [amoxicillin-pot Clavulanate] Nausea Only   Did it involve swelling of the face/tongue/throat, SOB, or low BP? No Did it involve sudden or severe rash/hives, skin peeling, or any reaction on the inside of your mouth or nose? No Did you need to seek medical attention at a hospital or doctor's office? No When did it last happen? More than 10 years If all above answers are "NO", may proceed with cephalosporin use.   Celebrex [celecoxib] Nausea Only   Oxycodone Other (See Comments)   Confusion, makes him "crazy"      Medication List       Accurate as of April 15, 2019  8:29 AM. If you  have any questions, ask your nurse or doctor.        Advair HFA 115-21 MCG/ACT inhaler Generic drug: fluticasone-salmeterol Inhale 2 puffs into the lungs 2 (two) times daily.   albuterol 108 (90 Base) MCG/ACT inhaler Commonly known as: VENTOLIN HFA Inhale 2 puffs into the lungs every 6 (six) hours as needed for wheezing or shortness of breath.   aspirin EC 81 MG tablet Take 81 mg by mouth daily.   atorvastatin 80 MG tablet Commonly known as: LIPITOR Take 80 mg by mouth daily with supper.   CALCIUM 600 PO Take 600 mg by mouth 2 (two) times daily.   fenofibrate 160 MG tablet Take 160 mg by mouth daily.    fluticasone 50 MCG/ACT nasal spray Commonly known as: FLONASE Place 2 sprays into both nostrils daily.   gabapentin 300 MG capsule Commonly known as: NEURONTIN Take 300 mg by mouth 2 (two) times daily.   HYDROcodone-acetaminophen 5-325 MG tablet Commonly known as: NORCO/VICODIN Take 1 tablet by mouth every 6 (six) hours as needed for moderate pain.   Iron Supplement 325 (65 FE) MG tablet Generic drug: ferrous sulfate Take 325 mg by mouth daily.   magnesium oxide 400 MG tablet Commonly known as: MAG-OX Take 400 mg by mouth 2 (two) times daily.   metoprolol succinate 25 MG 24 hr tablet Commonly known as: TOPROL-XL Take 25 mg by mouth every evening.   multivitamin with minerals Tabs tablet Take 1 tablet by mouth daily.   pantoprazole 40 MG tablet Commonly known as: PROTONIX Take 40 mg by mouth daily.   sertraline 50 MG tablet Commonly known as: ZOLOFT Take 50 mg by mouth at bedtime.       Allergies:  Allergies  Allergen Reactions  . Augmentin [Amoxicillin-Pot Clavulanate] Nausea Only    Did it involve swelling of the face/tongue/throat, SOB, or low BP? No Did it involve sudden or severe rash/hives, skin peeling, or any reaction on the inside of your mouth or nose? No Did you need to seek medical attention at a hospital or doctor's office? No When did it last happen? More than 10 years If all above answers are "NO", may proceed with cephalosporin use.   . Celebrex [Celecoxib] Nausea Only  . Oxycodone Other (See Comments)    Confusion, makes him "crazy"    Family History: Family History  Problem Relation Age of Onset  . Breast cancer Mother   . Hypertension Mother   . Rheum arthritis Father   . Cancer Father   . Lung cancer Brother   . Stroke Paternal Grandfather     Social History:  reports that he has quit smoking. His smoking use included cigarettes. He has a 40.00 pack-year smoking history. He has never used smokeless tobacco. He reports that he does  not drink alcohol or use drugs.  ROS: UROLOGY Frequent Urination?: Yes Hard to postpone urination?: No Burning/pain with urination?: No Get up at night to urinate?: Yes Leakage of urine?: No Urine stream starts and stops?: No Trouble starting stream?: No Do you have to strain to urinate?: No Blood in urine?: Yes Urinary tract infection?: No Sexually transmitted disease?: No Injury to kidneys or bladder?: No Painful intercourse?: No Weak stream?: No Erection problems?: No Penile pain?: No  Gastrointestinal Nausea?: No Vomiting?: No Indigestion/heartburn?: No Diarrhea?: No Constipation?: No  Constitutional Fever: No Night sweats?: No Weight loss?: No Fatigue?: No  Skin Skin rash/lesions?: No Itching?: No  Eyes Blurred vision?: No Double vision?: Yes  Ears/Nose/Throat Sore throat?: No Sinus problems?: No  Hematologic/Lymphatic Swollen glands?: No Easy bruising?: No  Cardiovascular Leg swelling?: No Chest pain?: No  Respiratory Cough?: No Shortness of breath?: No  Endocrine Excessive thirst?: No  Musculoskeletal Back pain?: Yes Joint pain?: No  Neurological Headaches?: No Dizziness?: No  Psychologic Depression?: No Anxiety?: No  Physical Exam: BP 122/76 (BP Location: Left Arm, Patient Position: Sitting, Cuff Size: Large)   Pulse 79   Ht 5\' 11"  (1.803 m)   Wt 239 lb 3.2 oz (108.5 kg)   BMI 33.36 kg/m   Constitutional:  Alert and oriented, No acute distress. HEENT: [place] AT, moist mucus membranes.  Trachea midline, no masses. Cardiovascular: No clubbing, cyanosis, or edema. Respiratory: Normal respiratory effort, no increased work of breathing. GI: Abdomen is soft, nontender, nondistended, no abdominal masses GU: No CVA tenderness.  Phallus uncircumcised without lesions.  Testes retractile without masses or tenderness.  Anterior rectal wall is flat and smooth with barely palpable prostate Lymph: No cervical or inguinal  lymphadenopathy. Skin: No rashes, bruises or suspicious lesions. Neurologic: Grossly intact, no focal deficits, moving all 4 extremities. Psychiatric: Normal mood and affect.  Laboratory Data:  Urinalysis Dipstick/microscopy negative   Assessment & Plan:    - Gross hematuria 74 y.o. male with high risk hematuria.  We discussed the standard recommendations for evaluation of high risk hematuria which include CT urogram and cystoscopy.  All questions were answered and he desires to schedule.  Abbie Sons, [facility] [address], [place], [place] [address] [phone]

## 2019-04-18 DIAGNOSIS — G4733 Obstructive sleep apnea (adult) (pediatric): Secondary | ICD-10-CM | POA: Diagnosis not present

## 2019-04-29 ENCOUNTER — Ambulatory Visit
Admission: RE | Admit: 2019-04-29 | Discharge: 2019-04-29 | Disposition: A | Discharge status: Home / Self Care | Payer: PPO | Source: Ambulatory Visit | Attending: Urology | Admitting: Urology

## 2019-04-29 ENCOUNTER — Other Ambulatory Visit: Payer: Self-pay

## 2019-04-29 DIAGNOSIS — C8590 Non-Hodgkin lymphoma, unspecified, unspecified site: Secondary | ICD-10-CM | POA: Diagnosis not present

## 2019-04-29 DIAGNOSIS — R310 Gross hematuria: Secondary | ICD-10-CM | POA: Insufficient documentation

## 2019-04-29 LAB — POCT I-STAT CREATININE: Creatinine, Ser: 1.9 mg/dL — ABNORMAL HIGH (ref 0.61–1.24)

## 2019-04-29 MED ORDER — IOHEXOL 300 MG/ML  SOLN
75.0000 mL | Freq: Once | INTRAMUSCULAR | Status: AC | PRN
  Administered 2019-04-29: 13:00:00 75 mL via INTRAVENOUS

## 2019-05-05 ENCOUNTER — Ambulatory Visit: Payer: PPO | Admitting: Urology

## 2019-05-05 ENCOUNTER — Other Ambulatory Visit: Payer: Self-pay

## 2019-05-05 ENCOUNTER — Encounter: Payer: Self-pay | Admitting: Urology

## 2019-05-05 VITALS — BP 145/80 | HR 94 | Ht 71.0 in | Wt 239.0 lb

## 2019-05-05 DIAGNOSIS — R310 Gross hematuria: Secondary | ICD-10-CM | POA: Diagnosis not present

## 2019-05-06 ENCOUNTER — Encounter: Payer: Self-pay | Admitting: Urology

## 2019-05-06 LAB — URINALYSIS, COMPLETE
Bilirubin, UA: NEGATIVE
Glucose, UA: NEGATIVE
Ketones, UA: NEGATIVE
Leukocytes,UA: NEGATIVE
Nitrite, UA: NEGATIVE
Protein,UA: NEGATIVE
RBC, UA: NEGATIVE
Specific Gravity, UA: 1.02 (ref 1.005–1.030)
Urobilinogen, Ur: 1 mg/dL (ref 0.2–1.0)
pH, UA: 7 (ref 5.0–7.5)

## 2019-05-06 LAB — MICROSCOPIC EXAMINATION
Bacteria, UA: NONE SEEN
RBC, Urine: NONE SEEN /hpf (ref 0–2)

## 2019-05-06 NOTE — Progress Notes (Signed)
   05/06/19  CC:  Chief Complaint  Patient presents with  . Cysto   Indications: High risk hematuria  HPI: Mr. Hagle has no complaints today.  Denies recurrent gross hematuria  Blood pressure (!) 145/80, pulse 94, height 5\' 11"  (1.803 m), weight 239 lb (108.4 kg). NED. A&Ox3.   No respiratory distress   Abd soft, NT, ND Normal phallus with bilateral descended testicles  Imaging: CTU showed bilateral, simple renal cyst.  No solid masses, calculi or hydronephrosis noted.  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  Severe DJD and chronic orthopedic findings  Cystoscopy Procedure Note  Patient identification was confirmed, informed consent was obtained, and patient was prepped using Betadine solution.  Lidocaine jelly was administered per urethral meatus.     Pre-Procedure: - Inspection reveals a normal caliber urethral meatus.  Procedure: The flexible cystoscope was introduced without difficulty - No urethral strictures/lesions are present. - Moderate enlargement prostate with prominent hypervascularity - Normal bladder neck - Bilateral ureteral orifices identified - Bladder mucosa  reveals no ulcers, tumors, or lesions - No bladder stones -Mild trabeculation  Retroflexion shows no intravesical median lobe or tumor   Post-Procedure: - Patient tolerated the procedure well  Assessment/ Plan: -No significant findings on cystoscopy.  Moderate BPH with hypervascularity -No significant upper tract abnormalities on CTU -Urine cytology was sent -Follow-up 6 months   Abbie Sons, MD

## 2019-05-09 DIAGNOSIS — M48061 Spinal stenosis, lumbar region without neurogenic claudication: Secondary | ICD-10-CM | POA: Diagnosis not present

## 2019-05-09 DIAGNOSIS — M545 Low back pain: Secondary | ICD-10-CM | POA: Diagnosis not present

## 2019-05-09 DIAGNOSIS — M5416 Radiculopathy, lumbar region: Secondary | ICD-10-CM | POA: Diagnosis not present

## 2019-05-12 ENCOUNTER — Other Ambulatory Visit: Payer: Self-pay | Admitting: Urology

## 2019-05-16 ENCOUNTER — Telehealth: Payer: Self-pay | Admitting: Urology

## 2019-05-16 NOTE — Telephone Encounter (Signed)
Urine cytology showed no abnormal cells 

## 2019-05-16 NOTE — Telephone Encounter (Signed)
Notified patient as instructed, patient pleased. Discussed follow-up appointments, patient agrees  

## 2019-05-18 DIAGNOSIS — M5416 Radiculopathy, lumbar region: Secondary | ICD-10-CM | POA: Diagnosis not present

## 2019-05-18 DIAGNOSIS — M545 Low back pain: Secondary | ICD-10-CM | POA: Diagnosis not present

## 2019-05-18 DIAGNOSIS — M48061 Spinal stenosis, lumbar region without neurogenic claudication: Secondary | ICD-10-CM | POA: Diagnosis not present

## 2019-06-02 DIAGNOSIS — M545 Low back pain: Secondary | ICD-10-CM | POA: Diagnosis not present

## 2019-06-02 DIAGNOSIS — M48061 Spinal stenosis, lumbar region without neurogenic claudication: Secondary | ICD-10-CM | POA: Diagnosis not present

## 2019-06-02 DIAGNOSIS — M25551 Pain in right hip: Secondary | ICD-10-CM | POA: Diagnosis not present

## 2019-06-02 DIAGNOSIS — M1611 Unilateral primary osteoarthritis, right hip: Secondary | ICD-10-CM | POA: Diagnosis not present

## 2019-06-15 DIAGNOSIS — M25551 Pain in right hip: Secondary | ICD-10-CM | POA: Diagnosis not present

## 2019-06-24 DIAGNOSIS — C8590 Non-Hodgkin lymphoma, unspecified, unspecified site: Secondary | ICD-10-CM | POA: Diagnosis not present

## 2019-06-24 DIAGNOSIS — N1832 Chronic kidney disease, stage 3b: Secondary | ICD-10-CM | POA: Diagnosis not present

## 2019-06-24 DIAGNOSIS — Z79899 Other long term (current) drug therapy: Secondary | ICD-10-CM | POA: Diagnosis not present

## 2019-06-24 DIAGNOSIS — R7309 Other abnormal glucose: Secondary | ICD-10-CM | POA: Diagnosis not present

## 2019-06-24 DIAGNOSIS — Z0000 Encounter for general adult medical examination without abnormal findings: Secondary | ICD-10-CM | POA: Diagnosis not present

## 2019-06-24 DIAGNOSIS — I10 Essential (primary) hypertension: Secondary | ICD-10-CM | POA: Diagnosis not present

## 2019-06-24 DIAGNOSIS — E7800 Pure hypercholesterolemia, unspecified: Secondary | ICD-10-CM | POA: Diagnosis not present

## 2019-06-27 DIAGNOSIS — M25551 Pain in right hip: Secondary | ICD-10-CM | POA: Diagnosis not present

## 2019-07-15 NOTE — Patient Instructions (Addendum)
DUE TO COVID-19 ONLY ONE VISITOR IS ALLOWED TO COME WITH YOU AND STAY IN THE WAITING ROOM ONLY DURING PRE OP AND PROCEDURE DAY OF SURGERY. TWO VISITOR MAY VISIT WITH YOU AFTER SURGERY IN YOUR PRIVATE ROOM DURING VISITING HOURS ONLY!  10 a 8 p  YOU NEED TO HAVE A COVID 19 TEST ON_4-16-21______ @_10 :00______, THIS TEST MUST BE DONE BEFORE SURGERY, COME  To [facility]  PLEASE BEGIN THE QUARANTINE INSTRUCTIONS AS OUTLINED IN YOUR HANDOUT.                Gavin Maxwell  07/15/2019   Your procedure is scheduled on: 07-26-19   Report to [facility] Main  Entrance                                                   BRING MASK ANd TUBING ONLY FOR CPAP   Report to admitting at         0815 AM     Call this number if you have problems the morning of surgery [phone]    Remember:  NO SOLID FOOD AFTER MIDNIGHT THE NIGHT PRIOR TO SURGERY. NOTHING BY MOUTH EXCEPT CLEAR LIQUIDS UNTIL    0745 am. PLEASE FINISH ENSURE DRINK PER SURGEON ORDER  WHICH NEEDS TO BE COMPLETED AT     0745 am then nothing by mouth .    CLEAR LIQUID DIET   Foods Allowed                                                                                     Foods Excluded  Coffee and tea, regular and decaf   No creamer                                       liquids that you cannot  Plain Jell-O any favor except red or purple                                           see through such as: Fruit ices (not with fruit  )                                                                   milk, soups, orange juice  Iced Popsicles                                                       All solid  food Carbonated beverages, regular and diet                                    Cranberry, grape and apple juices Sports drinks like Gatorade Lightly seasoned clear broth or consume(fat free) Sugar, honey syrup     BRUSH YOUR TEETH MORNING OF SURGERY AND RINSE YOUR MOUTH OUT, NO CHEWING GUM CANDY OR MINTS.     Take these medicines the  morning of surgery with A SIP OF WATER: protonix,  hydrocodone if needed,inhalers and bring with you, flonase , fenofibrate,                You may not have any metal on your body including hair pins and              piercings  Do not wear jewelry, , lotions, powders or perfumes, deodorant              Men may shave face and neck.   Do not bring valuables to the hospital. [place].  Contacts, dentures or bridgework may not be worn into surgery.  Leave suitcase in the car. After surgery it may be brought to your room.  _____________________________________________________________________           [facility] - Preparing for Surgery Before surgery, you can play an important role.  Because skin is not sterile, your skin needs to be as free of germs as possible.  You can reduce the number of germs on your skin by washing with CHG (chlorahexidine gluconate) soap before surgery.  CHG is an antiseptic cleaner which kills germs and bonds with the skin to continue killing germs even after washing. Please DO NOT use if you have an allergy to CHG or antibacterial soaps.  If your skin becomes reddened/irritated stop using the CHG and inform your nurse when you arrive at Short Stay. Do not shave (including legs and underarms) for at least 48 hours prior to the first CHG shower.  You may shave your face/neck. Please follow these instructions carefully:  1.  Shower with CHG Soap the night before surgery and the  morning of Surgery.  2.  If you choose to wash your hair, wash your hair first as usual with your  normal  shampoo.  3.  After you shampoo, rinse your hair and body thoroughly to remove the  shampoo.                           4.  Use CHG as you would any other liquid soap.  You can apply chg directly  to the skin and wash                       Gently with a scrungie or clean washcloth.  5.  Apply the CHG Soap to your body ONLY FROM THE NECK DOWN.    Do not use on face/ open                           Wound or open sores. Avoid contact with eyes, ears mouth and genitals (private parts).  Wash face,  Genitals (private parts) with your normal soap.             6.  Wash thoroughly, paying special attention to the area where your surgery  will be performed.  7.  Thoroughly rinse your body with warm water from the neck down.  8.  DO NOT shower/wash with your normal soap after using and rinsing off  the CHG Soap.                9.  Pat yourself dry with a clean towel.            10.  Wear clean pajamas.            11.  Place clean sheets on your bed the night of your first shower and do not  sleep with pets. Day of Surgery : Do not apply any lotions/deodorants the morning of surgery.  Please wear clean clothes to the hospital/surgery center.  FAILURE TO FOLLOW THESE INSTRUCTIONS MAY RESULT IN THE CANCELLATION OF YOUR SURGERY PATIENT SIGNATURE_________________________________  NURSE SIGNATURE__________________________________  ________________________________________________________________________   Gavin Maxwell  An incentive spirometer is a tool that can help keep your lungs clear and active. This tool measures how well you are filling your lungs with each breath. Taking long deep breaths may help reverse or decrease the chance of developing breathing (pulmonary) problems (especially infection) following:  A long period of time when you are unable to move or be active. BEFORE THE PROCEDURE   If the spirometer includes an indicator to show your best effort, your nurse or respiratory therapist will set it to a desired goal.  If possible, sit up straight or lean slightly forward. Try not to slouch.  Hold the incentive spirometer in an upright position. INSTRUCTIONS FOR USE  1. Sit on the edge of your bed if possible, or sit up as far as you can in bed or on a chair. 2. Hold the incentive spirometer in an  upright position. 3. Breathe out normally. 4. Place the mouthpiece in your mouth and seal your lips tightly around it. 5. Breathe in slowly and as deeply as possible, raising the piston or the ball toward the top of the column. 6. Hold your breath for 3-5 seconds or for as long as possible. Allow the piston or ball to fall to the bottom of the column. 7. Remove the mouthpiece from your mouth and breathe out normally. 8. Rest for a few seconds and repeat Steps 1 through 7 at least 10 times every 1-2 hours when you are awake. Take your time and take a few normal breaths between deep breaths. 9. The spirometer may include an indicator to show your best effort. Use the indicator as a goal to work toward during each repetition. 10. After each set of 10 deep breaths, practice coughing to be sure your lungs are clear. If you have an incision (the cut made at the time of surgery), support your incision when coughing by placing a pillow or rolled up towels firmly against it. Once you are able to get out of bed, walk around indoors and cough well. You may stop using the incentive spirometer when instructed by your caregiver.  RISKS AND COMPLICATIONS  Take your time so you do not get dizzy or light-headed.  If you are in pain, you may need to take or ask for pain medication before doing incentive spirometry. It is harder to take a deep breath if you are having pain.  AFTER USE  Rest and breathe slowly and easily.  It can be helpful to keep track of a log of your progress. Your caregiver can provide you with a simple table to help with this. If you are using the spirometer at home, follow these instructions: [place] IF:   You are having difficultly using the spirometer.  You have trouble using the spirometer as often as instructed.  Your pain medication is not giving enough relief while using the spirometer.  You develop fever of 100.5 F (38.1 C) or higher. SEEK IMMEDIATE MEDICAL CARE  IF:   You cough up bloody sputum that had not been present before.  You develop fever of 102 F (38.9 C) or greater.  You develop worsening pain at or near the incision site. MAKE SURE YOU:   Understand these instructions.  Will watch your condition.  Will get help right away if you are not doing well or get worse. Document Released: 08/04/2006 Document Revised: 06/16/2011 Document Reviewed: 10/05/2006 [facility] Patient Information 2014 [place], [place].   ________________________________________________________________________

## 2019-07-15 NOTE — Progress Notes (Signed)
PCP - Dr. Doyle [facility]  lov 06-24-19 Cardiologist - Dr. Barnie Del 04-12-19 epic  Chest x-ray - CT chest 03-25-19 epic EKG - 04-12-19 epic Stress Test -  ECHO -  Cardiac Cath -   Sleep Study -  CPAP -   Fasting Blood Sugar -  Checks Blood Sugar _____ times a day  Blood Thinner Instructions: Aspirin Instructions: Last Dose:  Anesthesia review: BUN 39, Creatine 2.05  Patient denies shortness of breath, fever, cough and chest pain at PAT appointment  NONE   Patient verbalized understanding of instructions that were given to them at the PAT appointment. Patient was also instructed that they will need to review over the PAT instructions again at home before surgery.

## 2019-07-18 ENCOUNTER — Other Ambulatory Visit: Payer: Self-pay

## 2019-07-18 ENCOUNTER — Encounter (HOSPITAL_COMMUNITY): Payer: Self-pay

## 2019-07-18 ENCOUNTER — Encounter (HOSPITAL_COMMUNITY)
Admission: RE | Admit: 2019-07-18 | Discharge: 2019-07-18 | Disposition: A | Discharge status: Home / Self Care | Payer: PPO | Source: Ambulatory Visit | Attending: Orthopedic Surgery | Admitting: Orthopedic Surgery

## 2019-07-18 DIAGNOSIS — Z01812 Encounter for preprocedural laboratory examination: Secondary | ICD-10-CM | POA: Insufficient documentation

## 2019-07-18 HISTORY — DX: Cardiac arrhythmia, unspecified: I49.9

## 2019-07-18 HISTORY — DX: Pneumonia, unspecified organism: J18.9

## 2019-07-18 LAB — CBC
HCT: 35.9 % — ABNORMAL LOW (ref 39.0–52.0)
Hemoglobin: 12 g/dL — ABNORMAL LOW (ref 13.0–17.0)
MCH: 32.3 pg (ref 26.0–34.0)
MCHC: 33.4 g/dL (ref 30.0–36.0)
MCV: 96.8 fL (ref 80.0–100.0)
Platelets: 230 10*3/uL (ref 150–400)
RBC: 3.71 MIL/uL — ABNORMAL LOW (ref 4.22–5.81)
RDW: 12.8 % (ref 11.5–15.5)
WBC: 4.1 10*3/uL (ref 4.0–10.5)
nRBC: 0 % (ref 0.0–0.2)

## 2019-07-18 LAB — BASIC METABOLIC PANEL
Anion gap: 12 (ref 5–15)
BUN: 39 mg/dL — ABNORMAL HIGH (ref 8–23)
CO2: 24 mmol/L (ref 22–32)
Calcium: 9.3 mg/dL (ref 8.9–10.3)
Chloride: 102 mmol/L (ref 98–111)
Creatinine, Ser: 2.05 mg/dL — ABNORMAL HIGH (ref 0.61–1.24)
GFR calc Af Amer: 35 mL/min — ABNORMAL LOW (ref >60)
GFR calc non Af Amer: 31 mL/min — ABNORMAL LOW (ref >60)
Glucose, Bld: 101 mg/dL — ABNORMAL HIGH (ref 70–99)
Potassium: 5.1 mmol/L (ref 3.5–5.1)
Sodium: 138 mmol/L (ref 135–145)

## 2019-07-18 LAB — SURGICAL PCR SCREEN
MRSA, PCR: NEGATIVE
Staphylococcus aureus: NEGATIVE

## 2019-07-21 DIAGNOSIS — H2511 Age-related nuclear cataract, right eye: Secondary | ICD-10-CM | POA: Diagnosis not present

## 2019-07-22 ENCOUNTER — Other Ambulatory Visit
Admission: RE | Admit: 2019-07-22 | Discharge: 2019-07-22 | Disposition: A | Discharge status: Home / Self Care | Payer: PPO | Source: Ambulatory Visit | Attending: Orthopedic Surgery | Admitting: Orthopedic Surgery

## 2019-07-22 ENCOUNTER — Other Ambulatory Visit: Payer: Self-pay

## 2019-07-22 DIAGNOSIS — Z20822 Contact with and (suspected) exposure to covid-19: Secondary | ICD-10-CM | POA: Insufficient documentation

## 2019-07-22 DIAGNOSIS — Z01812 Encounter for preprocedural laboratory examination: Secondary | ICD-10-CM | POA: Diagnosis not present

## 2019-07-22 LAB — SARS CORONAVIRUS 2 (TAT 6-24 HRS): SARS Coronavirus 2: NEGATIVE

## 2019-07-26 ENCOUNTER — Observation Stay (HOSPITAL_COMMUNITY): Payer: PPO

## 2019-07-26 ENCOUNTER — Ambulatory Visit (HOSPITAL_COMMUNITY): Payer: PPO | Admitting: Anesthesiology

## 2019-07-26 ENCOUNTER — Observation Stay (HOSPITAL_COMMUNITY)
Admission: RE | Admit: 2019-07-26 | Discharge: 2019-07-27 | Disposition: A | Discharge status: Home / Self Care | Payer: PPO | Source: Ambulatory Visit | Attending: Orthopedic Surgery | Admitting: Orthopedic Surgery

## 2019-07-26 ENCOUNTER — Encounter (HOSPITAL_COMMUNITY)
Admission: RE | Disposition: A | Discharge status: Home / Self Care | Payer: Self-pay | Source: Ambulatory Visit | Attending: Orthopedic Surgery

## 2019-07-26 ENCOUNTER — Ambulatory Visit (HOSPITAL_COMMUNITY): Payer: PPO | Admitting: Physician Assistant

## 2019-07-26 ENCOUNTER — Encounter (HOSPITAL_COMMUNITY): Payer: Self-pay | Admitting: Orthopedic Surgery

## 2019-07-26 DIAGNOSIS — Z79899 Other long term (current) drug therapy: Secondary | ICD-10-CM | POA: Diagnosis not present

## 2019-07-26 DIAGNOSIS — Z6833 Body mass index (BMI) 33.0-33.9, adult: Secondary | ICD-10-CM | POA: Diagnosis not present

## 2019-07-26 DIAGNOSIS — G4730 Sleep apnea, unspecified: Secondary | ICD-10-CM | POA: Diagnosis not present

## 2019-07-26 DIAGNOSIS — M879 Osteonecrosis, unspecified: Secondary | ICD-10-CM | POA: Diagnosis not present

## 2019-07-26 DIAGNOSIS — M1611 Unilateral primary osteoarthritis, right hip: Secondary | ICD-10-CM | POA: Diagnosis present

## 2019-07-26 DIAGNOSIS — Z8261 Family history of arthritis: Secondary | ICD-10-CM | POA: Insufficient documentation

## 2019-07-26 DIAGNOSIS — Z471 Aftercare following joint replacement surgery: Secondary | ICD-10-CM | POA: Diagnosis not present

## 2019-07-26 DIAGNOSIS — I129 Hypertensive chronic kidney disease with stage 1 through stage 4 chronic kidney disease, or unspecified chronic kidney disease: Secondary | ICD-10-CM | POA: Diagnosis not present

## 2019-07-26 DIAGNOSIS — N183 Chronic kidney disease, stage 3 unspecified: Secondary | ICD-10-CM | POA: Diagnosis not present

## 2019-07-26 DIAGNOSIS — F329 Major depressive disorder, single episode, unspecified: Secondary | ICD-10-CM | POA: Insufficient documentation

## 2019-07-26 DIAGNOSIS — F419 Anxiety disorder, unspecified: Secondary | ICD-10-CM | POA: Insufficient documentation

## 2019-07-26 DIAGNOSIS — E785 Hyperlipidemia, unspecified: Secondary | ICD-10-CM | POA: Insufficient documentation

## 2019-07-26 DIAGNOSIS — Z880 Allergy status to penicillin: Secondary | ICD-10-CM | POA: Insufficient documentation

## 2019-07-26 DIAGNOSIS — Z87891 Personal history of nicotine dependence: Secondary | ICD-10-CM | POA: Insufficient documentation

## 2019-07-26 DIAGNOSIS — Z7982 Long term (current) use of aspirin: Secondary | ICD-10-CM | POA: Insufficient documentation

## 2019-07-26 DIAGNOSIS — K219 Gastro-esophageal reflux disease without esophagitis: Secondary | ICD-10-CM | POA: Insufficient documentation

## 2019-07-26 DIAGNOSIS — Z7951 Long term (current) use of inhaled steroids: Secondary | ICD-10-CM | POA: Insufficient documentation

## 2019-07-26 DIAGNOSIS — I471 Supraventricular tachycardia: Secondary | ICD-10-CM | POA: Diagnosis not present

## 2019-07-26 DIAGNOSIS — E669 Obesity, unspecified: Secondary | ICD-10-CM | POA: Diagnosis not present

## 2019-07-26 DIAGNOSIS — Z96641 Presence of right artificial hip joint: Secondary | ICD-10-CM | POA: Diagnosis not present

## 2019-07-26 DIAGNOSIS — I10 Essential (primary) hypertension: Secondary | ICD-10-CM | POA: Diagnosis not present

## 2019-07-26 DIAGNOSIS — M87051 Idiopathic aseptic necrosis of right femur: Secondary | ICD-10-CM | POA: Diagnosis not present

## 2019-07-26 DIAGNOSIS — Z8249 Family history of ischemic heart disease and other diseases of the circulatory system: Secondary | ICD-10-CM | POA: Insufficient documentation

## 2019-07-26 DIAGNOSIS — Z8572 Personal history of non-Hodgkin lymphomas: Secondary | ICD-10-CM | POA: Diagnosis not present

## 2019-07-26 DIAGNOSIS — Z96649 Presence of unspecified artificial hip joint: Secondary | ICD-10-CM

## 2019-07-26 DIAGNOSIS — Z881 Allergy status to other antibiotic agents status: Secondary | ICD-10-CM | POA: Diagnosis not present

## 2019-07-26 DIAGNOSIS — D649 Anemia, unspecified: Secondary | ICD-10-CM | POA: Diagnosis not present

## 2019-07-26 DIAGNOSIS — Z96642 Presence of left artificial hip joint: Secondary | ICD-10-CM | POA: Diagnosis not present

## 2019-07-26 DIAGNOSIS — Z885 Allergy status to narcotic agent status: Secondary | ICD-10-CM | POA: Diagnosis not present

## 2019-07-26 DIAGNOSIS — Z886 Allergy status to analgesic agent status: Secondary | ICD-10-CM | POA: Diagnosis not present

## 2019-07-26 HISTORY — PX: TOTAL HIP ARTHROPLASTY: SHX124

## 2019-07-26 SURGERY — ARTHROPLASTY, HIP, TOTAL,POSTERIOR APPROACH
Anesthesia: Spinal | Site: Hip | Laterality: Right

## 2019-07-26 MED ORDER — STERILE WATER FOR IRRIGATION IR SOLN
Status: DC | PRN
  Administered 2019-07-26: 2000 mL

## 2019-07-26 MED ORDER — PROPOFOL 10 MG/ML IV BOLUS
INTRAVENOUS | Status: AC
  Filled 2019-07-26: qty 20

## 2019-07-26 MED ORDER — PHENYLEPHRINE 40 MCG/ML (10ML) SYRINGE FOR IV PUSH (FOR BLOOD PRESSURE SUPPORT)
PREFILLED_SYRINGE | INTRAVENOUS | Status: DC | PRN
  Administered 2019-07-26: 120 ug via INTRAVENOUS
  Administered 2019-07-26: 200 ug via INTRAVENOUS

## 2019-07-26 MED ORDER — HYDROCODONE-ACETAMINOPHEN 5-325 MG PO TABS
1.0000 | ORAL_TABLET | ORAL | Status: DC | PRN
  Administered 2019-07-26: 2 via ORAL
  Filled 2019-07-26: qty 2

## 2019-07-26 MED ORDER — SERTRALINE HCL 50 MG PO TABS
50.0000 mg | ORAL_TABLET | Freq: Every day | ORAL | Status: DC
  Administered 2019-07-26: 50 mg via ORAL
  Filled 2019-07-26: qty 1

## 2019-07-26 MED ORDER — FENTANYL CITRATE (PF) 100 MCG/2ML IJ SOLN: 25.0000 ug | INTRAMUSCULAR | Status: DC | PRN

## 2019-07-26 MED ORDER — HYDROCODONE-ACETAMINOPHEN 10-325 MG PO TABS: 1.0000 | ORAL_TABLET | Freq: Four times a day (QID) | ORAL | 0 refills | Status: DC | PRN

## 2019-07-26 MED ORDER — ASPIRIN EC 325 MG PO TBEC
325.0000 mg | DELAYED_RELEASE_TABLET | Freq: Every day | ORAL | Status: DC
  Administered 2019-07-27: 325 mg via ORAL
  Filled 2019-07-26: qty 1

## 2019-07-26 MED ORDER — ACETAMINOPHEN 500 MG PO TABS
1000.0000 mg | ORAL_TABLET | Freq: Once | ORAL | Status: AC
  Administered 2019-07-26: 1000 mg via ORAL
  Filled 2019-07-26: qty 2

## 2019-07-26 MED ORDER — METHOCARBAMOL 500 MG PO TABS
500.0000 mg | ORAL_TABLET | Freq: Four times a day (QID) | ORAL | Status: DC | PRN
  Administered 2019-07-26 (×2): 500 mg via ORAL
  Filled 2019-07-26 (×2): qty 1

## 2019-07-26 MED ORDER — PROPOFOL 500 MG/50ML IV EMUL
INTRAVENOUS | Status: DC | PRN
  Administered 2019-07-26: 50 ug/kg/min via INTRAVENOUS

## 2019-07-26 MED ORDER — LIDOCAINE 2% (20 MG/ML) 5 ML SYRINGE
INTRAMUSCULAR | Status: AC
  Filled 2019-07-26: qty 5

## 2019-07-26 MED ORDER — ONDANSETRON HCL 4 MG/2ML IJ SOLN
INTRAMUSCULAR | Status: AC
  Filled 2019-07-26: qty 2

## 2019-07-26 MED ORDER — ATORVASTATIN CALCIUM 40 MG PO TABS
80.0000 mg | ORAL_TABLET | Freq: Every day | ORAL | Status: DC
  Administered 2019-07-26: 80 mg via ORAL
  Filled 2019-07-26: qty 2

## 2019-07-26 MED ORDER — CALCIUM POLYCARBOPHIL 625 MG PO TABS
625.0000 mg | ORAL_TABLET | Freq: Every day | ORAL | Status: DC
  Administered 2019-07-27: 625 mg via ORAL
  Filled 2019-07-26: qty 1

## 2019-07-26 MED ORDER — PROPOFOL 10 MG/ML IV BOLUS
INTRAVENOUS | Status: DC | PRN
  Administered 2019-07-26: 20 mg via INTRAVENOUS
  Administered 2019-07-26 (×3): 10 mg via INTRAVENOUS
  Administered 2019-07-26 (×2): 20 mg via INTRAVENOUS

## 2019-07-26 MED ORDER — CEFAZOLIN SODIUM-DEXTROSE 2-4 GM/100ML-% IV SOLN
2.0000 g | Freq: Four times a day (QID) | INTRAVENOUS | Status: AC
  Administered 2019-07-26 (×2): 2 g via INTRAVENOUS
  Filled 2019-07-26 (×2): qty 100

## 2019-07-26 MED ORDER — PHENYLEPHRINE HCL-NACL 10-0.9 MG/250ML-% IV SOLN
INTRAVENOUS | Status: DC | PRN
  Administered 2019-07-26: 50 ug/min via INTRAVENOUS

## 2019-07-26 MED ORDER — FERROUS SULFATE 325 (65 FE) MG PO TABS
325.0000 mg | ORAL_TABLET | Freq: Every day | ORAL | Status: DC
  Administered 2019-07-27: 325 mg via ORAL
  Filled 2019-07-26: qty 1

## 2019-07-26 MED ORDER — METOCLOPRAMIDE HCL 5 MG/ML IJ SOLN: 5.0000 mg | Freq: Three times a day (TID) | INTRAMUSCULAR | Status: DC | PRN

## 2019-07-26 MED ORDER — POVIDONE-IODINE 10 % EX SWAB
2.0000 "application " | Freq: Once | CUTANEOUS | Status: AC
  Administered 2019-07-26: 2 via TOPICAL

## 2019-07-26 MED ORDER — BUPIVACAINE HCL (PF) 0.25 % IJ SOLN
INTRAMUSCULAR | Status: DC | PRN
  Administered 2019-07-26: 30 mL

## 2019-07-26 MED ORDER — ONDANSETRON HCL 4 MG/2ML IJ SOLN: 4.0000 mg | Freq: Once | INTRAMUSCULAR | Status: DC | PRN

## 2019-07-26 MED ORDER — FENOFIBRATE 160 MG PO TABS
160.0000 mg | ORAL_TABLET | Freq: Every day | ORAL | Status: DC
  Administered 2019-07-27: 160 mg via ORAL
  Filled 2019-07-26: qty 1

## 2019-07-26 MED ORDER — GABAPENTIN 300 MG PO CAPS
300.0000 mg | ORAL_CAPSULE | Freq: Every day | ORAL | Status: DC
  Administered 2019-07-26: 300 mg via ORAL
  Filled 2019-07-26: qty 1

## 2019-07-26 MED ORDER — KETOROLAC TROMETHAMINE 30 MG/ML IJ SOLN
INTRAMUSCULAR | Status: DC | PRN
  Administered 2019-07-26: 30 mg via INTRAVENOUS

## 2019-07-26 MED ORDER — EPHEDRINE 5 MG/ML INJ
INTRAVENOUS | Status: AC
  Filled 2019-07-26: qty 10

## 2019-07-26 MED ORDER — MAGNESIUM OXIDE 400 (241.3 MG) MG PO TABS
400.0000 mg | ORAL_TABLET | Freq: Two times a day (BID) | ORAL | Status: DC
  Administered 2019-07-26 – 2019-07-27 (×2): 400 mg via ORAL
  Filled 2019-07-26 (×2): qty 1

## 2019-07-26 MED ORDER — FENTANYL CITRATE (PF) 100 MCG/2ML IJ SOLN
INTRAMUSCULAR | Status: AC
  Filled 2019-07-26: qty 2

## 2019-07-26 MED ORDER — ONDANSETRON HCL 4 MG/2ML IJ SOLN
INTRAMUSCULAR | Status: DC | PRN
  Administered 2019-07-26: 4 mg via INTRAVENOUS

## 2019-07-26 MED ORDER — CEFAZOLIN SODIUM-DEXTROSE 2-4 GM/100ML-% IV SOLN
2.0000 g | INTRAVENOUS | Status: AC
  Administered 2019-07-26: 11:00:00 2 g via INTRAVENOUS
  Filled 2019-07-26: qty 100

## 2019-07-26 MED ORDER — BUPIVACAINE IN DEXTROSE 0.75-8.25 % IT SOLN
INTRATHECAL | Status: DC | PRN
  Administered 2019-07-26: 2 mL via INTRATHECAL

## 2019-07-26 MED ORDER — MOMETASONE FURO-FORMOTEROL FUM 200-5 MCG/ACT IN AERO
2.0000 | INHALATION_SPRAY | Freq: Two times a day (BID) | RESPIRATORY_TRACT | Status: DC
  Administered 2019-07-26 – 2019-07-27 (×2): 2 via RESPIRATORY_TRACT
  Filled 2019-07-26: qty 8.8

## 2019-07-26 MED ORDER — METOPROLOL SUCCINATE ER 25 MG PO TB24
25.0000 mg | ORAL_TABLET | Freq: Every day | ORAL | Status: DC
  Administered 2019-07-26: 25 mg via ORAL
  Filled 2019-07-26: qty 1

## 2019-07-26 MED ORDER — ONDANSETRON HCL 4 MG/2ML IJ SOLN: 4.0000 mg | Freq: Four times a day (QID) | INTRAMUSCULAR | Status: DC | PRN

## 2019-07-26 MED ORDER — SENNA-DOCUSATE SODIUM 8.6-50 MG PO TABS: 2.0000 | ORAL_TABLET | Freq: Every day | ORAL | 1 refills | Status: DC

## 2019-07-26 MED ORDER — POLYETHYLENE GLYCOL 3350 17 G PO PACK: 17.0000 g | PACK | Freq: Every day | ORAL | Status: DC | PRN

## 2019-07-26 MED ORDER — MAGNESIUM CITRATE PO SOLN: 1.0000 | Freq: Once | ORAL | Status: DC | PRN

## 2019-07-26 MED ORDER — CALCIUM CARBONATE 1250 (500 CA) MG PO TABS
1250.0000 mg | ORAL_TABLET | Freq: Two times a day (BID) | ORAL | Status: DC
  Administered 2019-07-26 – 2019-07-27 (×2): 1250 mg via ORAL
  Filled 2019-07-26 (×2): qty 1

## 2019-07-26 MED ORDER — FLUTICASONE PROPIONATE 50 MCG/ACT NA SUSP
2.0000 | Freq: Every day | NASAL | Status: DC
  Administered 2019-07-26 – 2019-07-27 (×2): 2 via NASAL
  Filled 2019-07-26: qty 16

## 2019-07-26 MED ORDER — BACLOFEN 10 MG PO TABS: 10.0000 mg | ORAL_TABLET | Freq: Three times a day (TID) | ORAL | 0 refills | Status: DC

## 2019-07-26 MED ORDER — PANTOPRAZOLE SODIUM 40 MG PO TBEC
40.0000 mg | DELAYED_RELEASE_TABLET | Freq: Every day | ORAL | Status: DC
  Administered 2019-07-27: 40 mg via ORAL
  Filled 2019-07-26: qty 1

## 2019-07-26 MED ORDER — HYDROCODONE-ACETAMINOPHEN 7.5-325 MG PO TABS
1.0000 | ORAL_TABLET | ORAL | Status: DC | PRN
  Administered 2019-07-26 – 2019-07-27 (×3): 2 via ORAL
  Filled 2019-07-26 (×3): qty 2

## 2019-07-26 MED ORDER — ONDANSETRON HCL 4 MG PO TABS: 4.0000 mg | ORAL_TABLET | Freq: Four times a day (QID) | ORAL | Status: DC | PRN

## 2019-07-26 MED ORDER — DIPHENHYDRAMINE HCL 12.5 MG/5ML PO ELIX: 12.5000 mg | ORAL_SOLUTION | ORAL | Status: DC | PRN

## 2019-07-26 MED ORDER — ALBUTEROL SULFATE (2.5 MG/3ML) 0.083% IN NEBU: 2.5000 mg | INHALATION_SOLUTION | Freq: Four times a day (QID) | RESPIRATORY_TRACT | Status: DC | PRN

## 2019-07-26 MED ORDER — METOCLOPRAMIDE HCL 5 MG PO TABS: 5.0000 mg | ORAL_TABLET | Freq: Three times a day (TID) | ORAL | Status: DC | PRN

## 2019-07-26 MED ORDER — LACTATED RINGERS IV SOLN: INTRAVENOUS | Status: DC

## 2019-07-26 MED ORDER — ONDANSETRON HCL 4 MG PO TABS: 4.0000 mg | ORAL_TABLET | Freq: Three times a day (TID) | ORAL | 0 refills | Status: DC | PRN

## 2019-07-26 MED ORDER — POTASSIUM CHLORIDE IN NACL 20-0.45 MEQ/L-% IV SOLN
INTRAVENOUS | Status: DC
  Filled 2019-07-26 (×2): qty 1000

## 2019-07-26 MED ORDER — TRANEXAMIC ACID-NACL 1000-0.7 MG/100ML-% IV SOLN
1000.0000 mg | Freq: Once | INTRAVENOUS | Status: AC
  Administered 2019-07-26: 1000 mg via INTRAVENOUS
  Filled 2019-07-26: qty 100

## 2019-07-26 MED ORDER — 0.9 % SODIUM CHLORIDE (POUR BTL) OPTIME
TOPICAL | Status: DC | PRN
  Administered 2019-07-26: 1000 mL

## 2019-07-26 MED ORDER — METHOCARBAMOL 500 MG IVPB - SIMPLE MED
500.0000 mg | Freq: Four times a day (QID) | INTRAVENOUS | Status: DC | PRN
  Filled 2019-07-26: qty 50

## 2019-07-26 MED ORDER — FENTANYL CITRATE (PF) 100 MCG/2ML IJ SOLN
INTRAMUSCULAR | Status: DC | PRN
  Administered 2019-07-26: 50 ug via INTRAVENOUS
  Administered 2019-07-26 (×2): 25 ug via INTRAVENOUS
  Administered 2019-07-26: 50 ug via INTRAVENOUS
  Administered 2019-07-26 (×2): 25 ug via INTRAVENOUS

## 2019-07-26 MED ORDER — ACETAMINOPHEN 325 MG PO TABS: 325.0000 mg | ORAL_TABLET | Freq: Four times a day (QID) | ORAL | Status: DC | PRN

## 2019-07-26 MED ORDER — PHENOL 1.4 % MT LIQD: 1.0000 | OROMUCOSAL | Status: DC | PRN

## 2019-07-26 MED ORDER — EPHEDRINE SULFATE-NACL 50-0.9 MG/10ML-% IV SOSY
PREFILLED_SYRINGE | INTRAVENOUS | Status: DC | PRN
  Administered 2019-07-26 (×3): 5 mg via INTRAVENOUS

## 2019-07-26 MED ORDER — ALUM & MAG HYDROXIDE-SIMETH 200-200-20 MG/5ML PO SUSP: 30.0000 mL | ORAL | Status: DC | PRN

## 2019-07-26 MED ORDER — KETOROLAC TROMETHAMINE 30 MG/ML IJ SOLN
INTRAMUSCULAR | Status: AC
  Filled 2019-07-26: qty 1

## 2019-07-26 MED ORDER — BUPIVACAINE HCL 0.25 % IJ SOLN
INTRAMUSCULAR | Status: AC
  Filled 2019-07-26: qty 1

## 2019-07-26 MED ORDER — MENTHOL 3 MG MT LOZG: 1.0000 | LOZENGE | OROMUCOSAL | Status: DC | PRN

## 2019-07-26 MED ORDER — DEXAMETHASONE SODIUM PHOSPHATE 10 MG/ML IJ SOLN
10.0000 mg | Freq: Once | INTRAMUSCULAR | Status: AC
  Administered 2019-07-27: 10 mg via INTRAVENOUS
  Filled 2019-07-26: qty 1

## 2019-07-26 MED ORDER — BISACODYL 10 MG RE SUPP: 10.0000 mg | Freq: Every day | RECTAL | Status: DC | PRN

## 2019-07-26 MED ORDER — ADULT MULTIVITAMIN W/MINERALS CH
1.0000 | ORAL_TABLET | Freq: Every day | ORAL | Status: DC
  Administered 2019-07-27: 1 via ORAL
  Filled 2019-07-26: qty 1

## 2019-07-26 MED ORDER — MORPHINE SULFATE (PF) 2 MG/ML IV SOLN: 0.5000 mg | INTRAVENOUS | Status: DC | PRN

## 2019-07-26 MED ORDER — TRANEXAMIC ACID-NACL 1000-0.7 MG/100ML-% IV SOLN
1000.0000 mg | INTRAVENOUS | Status: AC
  Administered 2019-07-26: 1000 mg via INTRAVENOUS
  Filled 2019-07-26: qty 100

## 2019-07-26 MED ORDER — ASPIRIN EC 325 MG PO TBEC: 325.0000 mg | DELAYED_RELEASE_TABLET | Freq: Two times a day (BID) | ORAL | 0 refills | Status: DC

## 2019-07-26 MED ORDER — DOCUSATE SODIUM 100 MG PO CAPS
100.0000 mg | ORAL_CAPSULE | Freq: Two times a day (BID) | ORAL | Status: DC
  Administered 2019-07-26 – 2019-07-27 (×2): 100 mg via ORAL
  Filled 2019-07-26 (×2): qty 1

## 2019-07-26 MED ORDER — PROPOFOL 1000 MG/100ML IV EMUL
INTRAVENOUS | Status: AC
  Filled 2019-07-26: qty 100

## 2019-07-26 MED ORDER — PHENYLEPHRINE HCL (PRESSORS) 10 MG/ML IV SOLN
INTRAVENOUS | Status: AC
  Filled 2019-07-26: qty 1

## 2019-07-26 MED ORDER — ACETAMINOPHEN 500 MG PO TABS
500.0000 mg | ORAL_TABLET | Freq: Four times a day (QID) | ORAL | Status: DC
  Administered 2019-07-26 – 2019-07-27 (×2): 500 mg via ORAL
  Filled 2019-07-26 (×2): qty 1

## 2019-07-26 SURGICAL SUPPLY — 61 items
ARTICULEZE HEAD (Hips) ×3 IMPLANT
BIT DRILL 2.0X128 (BIT) ×2 IMPLANT
BIT DRILL 2.0X128MM (BIT) ×1
BLADE SAW SAG 73X25 THK (BLADE) ×1
BLADE SAW SGTL 73X25 THK (BLADE) ×2 IMPLANT
CLOSURE STERI-STRIP 1/2X4 (GAUZE/BANDAGES/DRESSINGS) ×2
CLSR STERI-STRIP ANTIMIC 1/2X4 (GAUZE/BANDAGES/DRESSINGS) ×4 IMPLANT
COVER SURGICAL LIGHT HANDLE (MISCELLANEOUS) ×3 IMPLANT
COVER WAND RF STERILE (DRAPES) IMPLANT
DRAPE INCISE IOBAN 66X45 STRL (DRAPES) ×3 IMPLANT
DRAPE ORTHO SPLIT 77X108 STRL (DRAPES) ×4
DRAPE POUCH INSTRU U-SHP 10X18 (DRAPES) ×3 IMPLANT
DRAPE SHEET LG 3/4 BI-LAMINATE (DRAPES) ×3 IMPLANT
DRAPE SURG 17X11 SM STRL (DRAPES) ×3 IMPLANT
DRAPE SURG ORHT 6 SPLT 77X108 (DRAPES) ×2 IMPLANT
DRAPE U-SHAPE 47X51 STRL (DRAPES) ×3 IMPLANT
DRSG MEPILEX BORDER 4X12 (GAUZE/BANDAGES/DRESSINGS) ×3 IMPLANT
DRSG MEPILEX BORDER 4X8 (GAUZE/BANDAGES/DRESSINGS) ×3 IMPLANT
DURAPREP 26ML APPLICATOR (WOUND CARE) ×6 IMPLANT
ELECT BLADE TIP CTD 4 INCH (ELECTRODE) ×3 IMPLANT
ELECT REM PT RETURN 15FT ADLT (MISCELLANEOUS) ×3 IMPLANT
ELIMINATOR HOLE APEX DEPUY (Hips) ×3 IMPLANT
FACESHIELD WRAPAROUND (MASK) ×3 IMPLANT
GLOVE BIO SURGEON STRL SZ7 (GLOVE) ×3 IMPLANT
GLOVE BIO SURGEON STRL SZ7.5 (GLOVE) ×3 IMPLANT
GLOVE BIOGEL PI IND STRL 7.0 (GLOVE) ×1 IMPLANT
GLOVE BIOGEL PI IND STRL 8 (GLOVE) ×1 IMPLANT
GLOVE BIOGEL PI INDICATOR 7.0 (GLOVE) ×2
GLOVE BIOGEL PI INDICATOR 8 (GLOVE) ×2
GOWN STRL REUS W/TWL LRG LVL3 (GOWN DISPOSABLE) ×6 IMPLANT
HEAD ARTICULEZE (Hips) ×1 IMPLANT
HOOD PEEL AWAY FLYTE STAYCOOL (MISCELLANEOUS) ×9 IMPLANT
KIT BASIN OR (CUSTOM PROCEDURE TRAY) ×3 IMPLANT
KIT TURNOVER KIT A (KITS) ×3 IMPLANT
LINER NEUTRAL 52X36MM PLUS 4 (Liner) ×3 IMPLANT
MANIFOLD NEPTUNE II (INSTRUMENTS) ×3 IMPLANT
NDL SAFETY ECLIPSE 18X1.5 (NEEDLE) ×2 IMPLANT
NEEDLE HYPO 18GX1.5 SHARP (NEEDLE) ×4
NS IRRIG 1000ML POUR BTL (IV SOLUTION) ×3 IMPLANT
PACK TOTAL JOINT (CUSTOM PROCEDURE TRAY) ×3 IMPLANT
PENCIL SMOKE EVACUATOR (MISCELLANEOUS) IMPLANT
PIN SECTOR W/GRIP ACE CUP 52MM (Hips) ×3 IMPLANT
PROTECTOR NERVE ULNAR (MISCELLANEOUS) ×3 IMPLANT
RETRIEVER SUT HEWSON (MISCELLANEOUS) ×3 IMPLANT
SCREW 6.5MMX25MM (Screw) ×3 IMPLANT
STEM OFFSET DUOFIX SZ6 STD (Stem) ×3 IMPLANT
SUCTION FRAZIER HANDLE 12FR (TUBING) ×2
SUCTION TUBE FRAZIER 12FR DISP (TUBING) ×1 IMPLANT
SUT FIBERWIRE #2 38 REV NDL BL (SUTURE) ×9
SUT VIC AB 0 CT1 36 (SUTURE) ×3 IMPLANT
SUT VIC AB 1 CT1 36 (SUTURE) ×6 IMPLANT
SUT VIC AB 2-0 CT1 27 (SUTURE) ×6
SUT VIC AB 2-0 CT1 TAPERPNT 27 (SUTURE) ×3 IMPLANT
SUT VIC AB 3-0 SH 8-18 (SUTURE) ×3 IMPLANT
SUTURE FIBERWR#2 38 REV NDL BL (SUTURE) ×3 IMPLANT
SYR CONTROL 10ML LL (SYRINGE) ×6 IMPLANT
TAPE STRIPS DRAPE STRL (GAUZE/BANDAGES/DRESSINGS) ×6 IMPLANT
TOWEL OR 17X26 10 PK STRL BLUE (TOWEL DISPOSABLE) ×3 IMPLANT
TRAY FOLEY MTR SLVR 16FR STAT (SET/KITS/TRAYS/PACK) ×3 IMPLANT
WATER STERILE IRR 1000ML POUR (IV SOLUTION) ×6 IMPLANT
YANKAUER SUCT BULB TIP 10FT TU (MISCELLANEOUS) ×3 IMPLANT

## 2019-07-26 NOTE — Anesthesia Postprocedure Evaluation (Signed)
Anesthesia Post Note  Patient: Gavin Maxwell  Procedure(s) Performed: TOTAL HIP ARTHROPLASTY (Right Hip)     Patient location during evaluation: PACU Anesthesia Type: Spinal Level of consciousness: oriented and awake and alert Pain management: pain level controlled Vital Signs Assessment: post-procedure vital signs reviewed and stable Respiratory status: spontaneous breathing, respiratory function stable and nonlabored ventilation Cardiovascular status: blood pressure returned to baseline and stable Postop Assessment: no headache, no backache, no apparent nausea or vomiting and spinal receding Anesthetic complications: no    Last Vitals:  Vitals:   07/26/19 1430 07/26/19 1440  BP: 101/60 107/66  Pulse: 62 64  Resp: 10   Temp: (!) 36.4 C   SpO2: 99% 100%    Last Pain:  Vitals:   07/26/19 1430  TempSrc:   PainSc: 0-No pain                 Cayce Quezada A.

## 2019-07-26 NOTE — Transfer of Care (Signed)
Immediate Anesthesia Transfer of Care Note  Patient: Gavin Maxwell  Procedure(s) Performed: TOTAL HIP ARTHROPLASTY (Right Hip)  Patient Location: PACU  Anesthesia Type:Spinal  Level of Consciousness: awake, alert , oriented and patient cooperative  Airway & Oxygen Therapy: Patient Spontanous Breathing and Patient connected to face mask oxygen  Post-op Assessment: Report given to RN and Post -op Vital signs reviewed and stable  Post vital signs: Reviewed and stable  Last Vitals:  Vitals Value Taken Time  BP 93/65 07/26/19 1330  Temp    Pulse 82 07/26/19 1331  Resp 16 07/26/19 1331  SpO2 96 % 07/26/19 1331  Vitals shown include unvalidated device data.  Last Pain:  Vitals:   07/26/19 0816  TempSrc:   PainSc: 5          Complications: No apparent anesthesia complications

## 2019-07-26 NOTE — Anesthesia Procedure Notes (Signed)
Spinal  Patient location during procedure: OR Start time: 07/26/2019 10:30 AM End time: 07/26/2019 10:35 AM Staffing Performed: anesthesiologist  Anesthesiologist: Josephine Igo, MD Preanesthetic Checklist Completed: patient identified, IV checked, site marked, risks and benefits discussed, surgical consent, monitors and equipment checked, pre-op evaluation and timeout performed Spinal Block Patient position: sitting Prep: DuraPrep and site prepped and draped Patient monitoring: heart rate, cardiac monitor, continuous pulse ox and blood pressure Approach: midline Location: L3-4 Injection technique: single-shot Needle Needle type: Pencan  Needle gauge: 24 G Needle length: 9 cm Needle insertion depth: 7 cm Assessment Sensory level: T4 Additional Notes Patient tolerated procedure well. Adequate sensory level.

## 2019-07-26 NOTE — Evaluation (Signed)
Physical Therapy Evaluation Patient Details Name: Gavin Maxwell MRN: [id number] DOB: 1945/06/23 Today's Date: 07/26/2019   History of Present Illness  R posterior THA; PMH of R reverse TSA 2015, R TKA 2016, lymphoma  Clinical Impression  Pt is s/p THA resulting in the deficits listed below (see PT Problem List). Pt ambulated 40' with RW, no loss of balance. Initiated THA HEP and instructed pt in posterior hip precautions. Good progress expected. Pt will benefit from skilled PT to increase their independence and safety with mobility to allow discharge to the venue listed below.      Follow Up Recommendations Follow surgeon's recommendation for DC plan and follow-up therapies    Equipment Recommendations  None recommended by PT    Recommendations for Other Services       Precautions / Restrictions Precautions Precautions: Posterior Hip Precaution Booklet Issued: Yes (comment) Precaution Comments: instructed pt in posterior hip precautions; sign hung in room Restrictions Weight Bearing Restrictions: No Other Position/Activity Restrictions: WBAT      Mobility  Bed Mobility Overal bed mobility: Needs Assistance Bed Mobility: Supine to Sit     Supine to sit: Mod assist     General bed mobility comments: mod A to raise trunk  Transfers Overall transfer level: Needs assistance Equipment used: Rolling walker (2 wheeled) Transfers: Sit to/from Stand Sit to Stand: Min assist;From elevated surface         General transfer comment: VCs hand placement, min A to power up  Ambulation/Gait Ambulation/Gait assistance: Min guard Gait Distance (Feet): 40 Feet Assistive device: Rolling walker (2 wheeled) Gait Pattern/deviations: Step-to pattern;Decreased stride length Gait velocity: decr   General Gait Details: VCs sequencing, no loss of balance  Stairs            Wheelchair Mobility    Modified Rankin (Stroke Patients Only)       Balance Overall balance  assessment: Modified Independent                                           Pertinent Vitals/Pain Pain Assessment: 0-10 Pain Score: 3  Pain Location: R hip Pain Descriptors / Indicators: Sore Pain Intervention(s): Limited activity within patient's tolerance;Monitored during session;Premedicated before session;Ice applied    Home Living Family/patient expects to be discharged to:: (P) Private residence Living Arrangements: (P) Spouse/significant other Available Help at Discharge: (P) Family;Available 24 hours/day   Home Access: (P) Stairs to enter Entrance Stairs-Rails: (P) None Entrance Stairs-Number of Steps: (P) 2 Home Layout: (P) One level Home Equipment: (P) Walker - 2 wheels;Cane - single point;Bedside commode Additional Comments: (P) wife works part time    Prior Function Level of Independence: (P) Independent with assistive device(s)         Comments: (P) used SPC prn     Hand Dominance        Extremity/Trunk Assessment   Upper Extremity Assessment Upper Extremity Assessment: LUE deficits/detail LUE Deficits / Details: h/o fx (pt pointed to upper humerus), wears a brace prn (pt stated he doesn't need it for using RW), L triceps and biceps -3/5    Lower Extremity Assessment Lower Extremity Assessment: RLE deficits/detail RLE Deficits / Details: R hip -3/5, knee ext +3/5 RLE Sensation: WNL RLE Coordination: WNL    Cervical / Trunk Assessment Cervical / Trunk Assessment: Normal  Communication   Communication: (P) No difficulties  Cognition Arousal/Alertness: Awake/alert Behavior  During Therapy: WFL for tasks assessed/performed Overall Cognitive Status: Within Functional Limits for tasks assessed                                        General Comments      Exercises Total Joint Exercises Ankle Circles/Pumps: AAROM;Both;15 reps;Supine Heel Slides: AROM;Right;10 reps;Supine   Assessment/Plan    PT Assessment  Patient needs continued PT services  PT Problem List Decreased strength;Decreased range of motion;Decreased activity tolerance;Decreased mobility;Pain       PT Treatment Interventions Gait training;Functional mobility training;Stair training;Therapeutic exercise;Therapeutic activities;Patient/family education    PT Goals (Current goals can be found in the Care Plan section)  Acute Rehab PT Goals Patient Stated Goal: to walk PT Goal Formulation: With patient Time For Goal Achievement: 08/02/19 Potential to Achieve Goals: Good    Frequency 7X/week   Barriers to discharge        Co-evaluation               AM-PAC PT "6 Clicks" Mobility  Outcome Measure Help needed turning from your back to your side while in a flat bed without using bedrails?: A Little Help needed moving from lying on your back to sitting on the side of a flat bed without using bedrails?: A Lot Help needed moving to and from a bed to a chair (including a wheelchair)?: A Little Help needed standing up from a chair using your arms (e.g., wheelchair or bedside chair)?: A Lot Help needed to walk in hospital room?: A Little Help needed climbing 3-5 steps with a railing? : A Lot 6 Click Score: 15    End of Session Equipment Utilized During Treatment: Gait belt Activity Tolerance: Patient tolerated treatment well Patient left: in chair;with call bell/phone within reach;with chair alarm set Nurse Communication: Mobility status PT Visit Diagnosis: Difficulty in walking, not elsewhere classified (R26.2);Pain Pain - Right/Left: Right Pain - part of body: Hip    Time: [id number] PT Time Calculation (min) (ACUTE ONLY): 22 min   Charges:   PT Evaluation $PT Eval Low Complexity: 1 Low          Blondell Reveal Kistler PT 07/26/2019  Acute Rehabilitation Services Pager [phone] Office [phone]

## 2019-07-26 NOTE — Anesthesia Preprocedure Evaluation (Addendum)
Anesthesia Evaluation  Patient identified by MRN, date of birth, ID band Patient awake    Reviewed: Allergy & Precautions, NPO status , Patient's Chart, lab work & pertinent test results, reviewed documented beta blocker date and time   Airway Mallampati: II  TM Distance: >3 FB Neck ROM: Full    Dental no notable dental hx. (+) Teeth Intact, Caps   Pulmonary sleep apnea and Continuous Positive Airway Pressure Ventilation , pneumonia, resolved, former smoker,    Pulmonary exam normal breath sounds clear to auscultation       Cardiovascular hypertension, Pt. on medications and Pt. on home beta blockers + dysrhythmias Supra Ventricular Tachycardia  Rhythm:Regular     Neuro/Psych PSYCHIATRIC DISORDERS Anxiety Depression negative neurological ROS     GI/Hepatic hiatal hernia, GERD  Medicated and Controlled,  Endo/Other  negative endocrine ROSObesity Hyperlipidemia  Renal/GU Renal InsufficiencyRenal disease  negative genitourinary   Musculoskeletal  (+) Arthritis , Osteoarthritis,  DJD right hip   Abdominal (+) + obese,   Peds  Hematology  (+) anemia , Hx/o Non Hodgkin's lymphoma 2004 S/P ChemoRx   Anesthesia Other Findings   Reproductive/Obstetrics                            Anesthesia Physical Anesthesia Plan  ASA: III  Anesthesia Plan: Spinal   Post-op Pain Management:    Induction:   PONV Risk Score and Plan: 2 and Ondansetron, Propofol infusion and Treatment may vary due to age or medical condition  Airway Management Planned: Natural Airway, Simple Face Mask and Nasal Cannula  Additional Equipment:   Intra-op Plan:   Post-operative Plan:   Informed Consent: I have reviewed the patients History and Physical, chart, labs and discussed the procedure including the risks, benefits and alternatives for the proposed anesthesia with the patient or authorized representative who has  indicated his/her understanding and acceptance.     Dental advisory given  Plan Discussed with: CRNA and Surgeon  Anesthesia Plan Comments:         Anesthesia Quick Evaluation

## 2019-07-26 NOTE — Plan of Care (Signed)
  Problem: Activity: Goal: Ability to avoid complications of mobility impairment will improve Outcome: Progressing   Problem: Activity: Goal: Ability to tolerate increased activity will improve Outcome: Progressing   Problem: Clinical Measurements: Goal: Postoperative complications will be avoided or minimized Outcome: Progressing   Problem: Pain Management: Goal: Pain level will decrease with appropriate interventions Outcome: Progressing   Problem: Skin Integrity: Goal: Will show signs of wound healing Outcome: Progressing

## 2019-07-26 NOTE — Discharge Instructions (Signed)
INSTRUCTIONS AFTER JOINT REPLACEMENT   o Remove items at home which could result in a fall. This includes throw rugs or furniture in walking pathways o ICE to the affected joint every three hours while awake for 30 minutes at a time, for at least the first 3-5 days, and then as needed for pain and swelling.  Continue to use ice for pain and swelling. You may notice swelling that will progress down to the foot and ankle.  This is normal after surgery.  Elevate your leg when you are not up walking on it.   o Continue to use the breathing machine you got in the hospital (incentive spirometer) which will help keep your temperature down.  It is common for your temperature to cycle up and down following surgery, especially at night when you are not up moving around and exerting yourself.  The breathing machine keeps your lungs expanded and your temperature down.   DIET:  As you were doing prior to hospitalization, we recommend a well-balanced diet.  DRESSING / WOUND CARE / SHOWERING  You may change your dressing 3-5 days after surgery.  Then change the dressing every day with sterile gauze.  Please use good hand washing techniques before changing the dressing.  Do not use any lotions or creams on the incision until instructed by your surgeon.  ACTIVITY  o Increase activity slowly as tolerated, but follow the weight bearing instructions below.   o No driving for 6 weeks or until further direction given by your physician.  You cannot drive while taking narcotics.  o No lifting or carrying greater than 10 lbs. until further directed by your surgeon. o Avoid periods of inactivity such as sitting longer than an hour when not asleep. This helps prevent blood clots.  o You may return to work once you are authorized by your doctor.     WEIGHT BEARING   Weight bearing as tolerated with assist device (walker, cane, etc) as directed, use it as long as suggested by your surgeon or therapist, typically at  least 4-6 weeks.   EXERCISES  Results after joint replacement surgery are often greatly improved when you follow the exercise, range of motion and muscle strengthening exercises prescribed by your doctor. Safety measures are also important to protect the joint from further injury. Any time any of these exercises cause you to have increased pain or swelling, decrease what you are doing until you are comfortable again and then slowly increase them. If you have problems or questions, call your caregiver or physical therapist for advice.   Rehabilitation is important following a joint replacement. After just a few days of immobilization, the muscles of the leg can become weakened and shrink (atrophy).  These exercises are designed to build up the tone and strength of the thigh and leg muscles and to improve motion. Often times heat used for twenty to thirty minutes before working out will loosen up your tissues and help with improving the range of motion but do not use heat for the first two weeks following surgery (sometimes heat can increase post-operative swelling).   These exercises can be done on a training (exercise) mat, on the floor, on a table or on a bed. Use whatever works the best and is most comfortable for you.    Use music or television while you are exercising so that the exercises are a pleasant break in your day. This will make your life better with the exercises acting as a break   in your routine that you can look forward to.   Perform all exercises about fifteen times, three times per day or as directed.  You should exercise both the operative leg and the other leg as well.  Exercises include:   . Quad Sets - Tighten up the muscle on the front of the thigh (Quad) and hold for 5-10 seconds.   . Straight Leg Raises - With your knee straight (if you were given a brace, keep it on), lift the leg to 60 degrees, hold for 3 seconds, and slowly lower the leg.  Perform this exercise against  resistance later as your leg gets stronger.  . Leg Slides: Lying on your back, slowly slide your foot toward your buttocks, bending your knee up off the floor (only go as far as is comfortable). Then slowly slide your foot back down until your leg is flat on the floor again.  . Angel Wings: Lying on your back spread your legs to the side as far apart as you can without causing discomfort.  . Hamstring Strength:  Lying on your back, push your heel against the floor with your leg straight by tightening up the muscles of your buttocks.  Repeat, but this time bend your knee to a comfortable angle, and push your heel against the floor.  You may put a pillow under the heel to make it more comfortable if necessary.   A rehabilitation program following joint replacement surgery can speed recovery and prevent re-injury in the future due to weakened muscles. Contact your doctor or a physical therapist for more information on knee rehabilitation.    CONSTIPATION  Constipation is defined medically as fewer than three stools per week and severe constipation as less than one stool per week.  Even if you have a regular bowel pattern at home, your normal regimen is likely to be disrupted due to multiple reasons following surgery.  Combination of anesthesia, postoperative narcotics, change in appetite and fluid intake all can affect your bowels.   YOU MUST use at least one of the following options; they are listed in order of increasing strength to get the job done.  They are all available over the counter, and you may need to use some, POSSIBLY even all of these options:    Drink plenty of fluids (prune juice may be helpful) and high fiber foods Colace 100 mg by mouth twice a day  Senokot for constipation as directed and as needed Dulcolax (bisacodyl), take with full glass of water  Miralax (polyethylene glycol) once or twice a day as needed.  If you have tried all these things and are unable to have a bowel  movement in the first 3-4 days after surgery call either your surgeon or your primary doctor.    If you experience loose stools or diarrhea, hold the medications until you stool forms back up.  If your symptoms do not get better within 1 week or if they get worse, check with your doctor.  If you experience "the worst abdominal pain ever" or develop nausea or vomiting, please contact the office immediately for further recommendations for treatment.   ITCHING:  If you experience itching with your medications, try taking only a single pain pill, or even half a pain pill at a time.  You can also use Benadryl over the counter for itching or also to help with sleep.   TED HOSE STOCKINGS:  Use stockings on both legs until for at least 2 weeks or as   directed by physician office. They may be removed at night for sleeping.  MEDICATIONS:  See your medication summary on the "After Visit Summary" that nursing will review with you.  You may have some home medications which will be placed on hold until you complete the course of blood thinner medication.  It is important for you to complete the blood thinner medication as prescribed.  PRECAUTIONS:  If you experience chest pain or shortness of breath - call 911 immediately for transfer to the hospital emergency department.   If you develop a fever greater that 101 F, purulent drainage from wound, increased redness or drainage from wound, foul odor from the wound/dressing, or calf pain - CONTACT YOUR SURGEON.                                                   FOLLOW-UP APPOINTMENTS:  If you do not already have a post-op appointment, please call the office for an appointment to be seen by your surgeon.  Guidelines for how soon to be seen are listed in your "After Visit Summary", but are typically between 1-4 weeks after surgery.  DENTAL ANTIBIOTICS:  In most cases prophylactic antibiotics for Dental procdeures after total joint surgery are not  necessary.  Exceptions are as follows:  1. History of prior total joint infection  2. Severely immunocompromised (Organ Transplant, cancer chemotherapy, Rheumatoid biologic meds such as Humera)  3. Poorly controlled diabetes (A1C &gt; 8.0, blood glucose over 200)  If you have one of these conditions, contact your surgeon for an antibiotic prescription, prior to your dental procedure.   MAKE SURE YOU:  . Understand these instructions.  . Get help right away if you are not doing well or get worse.    Thank you for letting us be a part of your medical care team.  It is a privilege we respect greatly.  We hope these instructions will help you stay on track for a fast and full recovery!   

## 2019-07-26 NOTE — H&P (Signed)
PREOPERATIVE H&P  Chief Complaint: Right hip pain  HPI: Gavin Maxwell is a 74 y.o. male who presents for preoperative history and physical with a diagnosis of right hip primary localized osteoarthritis. Symptoms are rated as moderate to severe, and have been worsening.  This is significantly impairing activities of daily living.  He has elected for surgical management.   He has failed injections, activity modification, anti-inflammatories, and assistive devices.  Preoperative X-rays demonstrate end stage degenerative changes with osteophyte formation, loss of joint space, subchondral sclerosis.   He has had a preoperative MRI arthrogram that demonstrated avascular necrosis with collapse.  He also reports burning numbness and tingling going all the way down of his leg to the foot.  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  Past Medical History:  Diagnosis Date  . Anxiety   . Cancer [facility])    non hodgins  lymphoma   2004 in remission  Left arm  wears a brace there is a bone broken  . Depression   . Dysrhythmia    SVT  . GERD (gastroesophageal reflux disease)   . History of hiatal hernia   . Hypertension   . Pneumonia   . Primary localized osteoarthritis of knee 09/26/2014  . Primary localized osteoarthrosis, shoulder region, rotator cuff arthropathy 10/04/2013  . Sleep apnea    cpap   Past Surgical History:  Procedure Laterality Date  . CARDIAC CATHETERIZATION     20 yrs. ago  . CHOLECYSTECTOMY    . EYE SURGERY    . JOINT REPLACEMENT     hip  . NASAL SINUS SURGERY     x2  . REVERSE SHOULDER ARTHROPLASTY Right 10/04/2013   Procedure: REVERSE SHOULDER ARTHROPLASTY;  Surgeon: Johnny Bridge, MD;  Location: [facility];  Service: Orthopedics;  Laterality: Right;  . SHOULDER ACROMIOPLASTY     x 5 shoulder surgeries  . SVT ABLATION N/A 03/07/2019   Procedure: SVT ABLATION;  Surgeon: Constance Haw, MD;  Location: [facility];  Service: Cardiovascular;  Laterality: N/A;  . TOTAL  KNEE ARTHROPLASTY Right 09/26/2014  . TOTAL KNEE ARTHROPLASTY Right 09/26/2014   Procedure: RIGHT TOTAL KNEE ARTHROPLASTY;  Surgeon: Marchia Bond, MD;  Location: [facility];  Service: Orthopedics;  Laterality: Right;  Marland Kitchen VIDEO ASSISTED THORACOSCOPY (VATS) W/TALC PLEUADESIS Right 08/18/2017   Procedure: VIDEO ASSISTED THORACOSCOPY (VATS)POSSIBLE  W/TALC PLEUADESIS.POSSIBLE BLEBECTOMY;  Surgeon: Nestor Lewandowsky, MD;  Location: [facility];  Service: Thoracic;  Laterality: Right;   Social History   Socioeconomic History  . Marital status: Married    Spouse name: Not on file  . Number of children: Not on file  . Years of education: Not on file  . Highest education level: Not on file  Occupational History  . Not on file  Tobacco Use  . Smoking status: Former Smoker    Packs/day: 2.00    Years: 20.00    Pack years: 40.00    Types: Cigarettes  . Smokeless tobacco: Never Used  . Tobacco comment: quit 25 years ago  Substance and Sexual Activity  . Alcohol use: No  . Drug use: No  . Sexual activity: Not Currently    Birth control/protection: None  Other Topics Concern  . Not on file  Social History Narrative  . Not on file   Social Determinants of Health   Financial Resource Strain:   . Difficulty of Paying Living Expenses:   Food Insecurity:   . Worried About Charity fundraiser in the  Last Year:   . [place] in the Last Year:   Transportation Needs:   . Film/video editor (Medical):   Marland Kitchen Lack of Transportation (Non-Medical):   Physical Activity:   . Days of Exercise per Week:   . Minutes of Exercise per Session:   Stress:   . Feeling of Stress :   Social Connections:   . Frequency of Communication with Friends and Family:   . Frequency of Social Gatherings with Friends and Family:   . Attends Religious Services:   . Active Member of Clubs or Organizations:   . Attends Archivist Meetings:   Marland Kitchen Marital Status:    Family History  Problem Relation Age of Onset   . Breast cancer Mother   . Hypertension Mother   . Rheum arthritis Father   . Cancer Father   . Lung cancer Brother   . Stroke Paternal Grandfather    Allergies  Allergen Reactions  . Augmentin [Amoxicillin-Pot Clavulanate] Nausea Only    Did it involve swelling of the face/tongue/throat, SOB, or low BP? No Did it involve sudden or severe rash/hives, skin peeling, or any reaction on the inside of your mouth or nose? No Did you need to seek medical attention at a hospital or doctor's office? No When did it last happen? More than 10 years If all above answers are "NO", may proceed with cephalosporin use.   . Celebrex [Celecoxib] Nausea Only  . Oxycodone Other (See Comments)    Confusion, makes him "crazy"   Prior to Admission medications   Medication Sig Start Date End Date Taking? Authorizing Provider  acetaminophen (TYLENOL) 500 MG tablet Take 1,000 mg by mouth every 6 (six) hours as needed for moderate pain.   Yes [provider]  albuterol (PROVENTIL HFA;VENTOLIN HFA) 108 (90 Base) MCG/ACT inhaler Inhale 2 puffs into the lungs every 6 (six) hours as needed for wheezing or shortness of breath. 11/03/17  Yes Wilhelmina Mcardle, MD  aspirin EC 81 MG tablet Take 81 mg by mouth daily.    Yes [provider]  atorvastatin (LIPITOR) 80 MG tablet Take 80 mg by mouth daily with supper.   Yes [provider]  Calcium Carbonate (CALCIUM 600 PO) Take 600 mg by mouth 2 (two) times daily.    Yes [provider]  fenofibrate 160 MG tablet Take 160 mg by mouth daily.    Yes [provider]  ferrous sulfate (IRON SUPPLEMENT) 325 (65 FE) MG tablet Take 325 mg by mouth daily.   Yes [provider]  fluticasone (FLONASE) 50 MCG/ACT nasal spray Place 2 sprays into both nostrils daily.    Yes [provider]  fluticasone-salmeterol (ADVAIR HFA) 115-21 MCG/ACT inhaler Inhale 2 puffs into the lungs 2 (two) times daily. 08/28/17 02/25/20 Yes  [provider]  gabapentin (NEURONTIN) 300 MG capsule Take 300 mg by mouth at bedtime.    Yes [provider]  HYDROcodone-acetaminophen (NORCO/VICODIN) 5-325 MG tablet Take 1 tablet by mouth every 6 (six) hours as needed for moderate pain. 08/21/17  Yes Wieting, Richard, MD  magnesium oxide (MAG-OX) 400 MG tablet Take 400 mg by mouth 2 (two) times daily.  01/23/19  Yes [provider]  metoprolol succinate (TOPROL-XL) 25 MG 24 hr tablet Take 25 mg by mouth every evening.    Yes [provider]  Multiple Vitamin (MULTIVITAMIN WITH MINERALS) TABS tablet Take 1 tablet by mouth daily.   Yes [provider]  naproxen sodium (ALEVE) 220 MG tablet Take 220 mg by mouth 2 (two) times daily with a meal.   Yes [provider]  pantoprazole (PROTONIX) 40 MG tablet Take 40 mg by mouth daily.    Yes [provider]  polycarbophil (FIBER-LAX) 625 MG tablet Take 625 mg by mouth daily.   Yes [provider]  sertraline (ZOLOFT) 50 MG tablet Take 50 mg by mouth at bedtime.   Yes [provider]     Positive ROS: All other systems have been reviewed and were otherwise negative with the exception of those mentioned in the HPI and as above.  Physical Exam: General: Alert, no acute distress Cardiovascular: No pedal edema Respiratory: No cyanosis, no use of accessory musculature GI: No organomegaly, abdomen is soft and non-tender Skin: No lesions in the area of chief complaint Neurologic: Sensation intact distally Psychiatric: Patient is competent for consent with normal mood and affect Lymphatic: No axillary or cervical lymphadenopathy  MUSCULOSKELETAL: Right hip has a painful arc of motion 0 to 80 degrees, no internal rotation, EHL is present.  Assessment: Right hip avascular necrosis with collapse   Plan: Plan for Procedure(s): TOTAL HIP ARTHROPLASTY  The risks benefits and alternatives were discussed with the patient  including but not limited to the risks of nonoperative treatment, versus surgical intervention including infection, bleeding, nerve injury,  blood clots, cardiopulmonary complications, morbidity, mortality, among others, and they were willing to proceed.    Patient's anticipated LOS is less than 2 midnights, meeting these requirements: - Younger than 90 - Lives within 1 hour of care - Has a competent adult at home to recover with post-op recover - NO history of  - Chronic pain requiring opiods  - Diabetes  - Coronary Artery Disease  - Heart failure  - Heart attack  - Stroke  - DVT/VTE  - Cardiac arrhythmia  - Respiratory Failure/COPD  - Renal failure  - Anemia  - Advanced Liver disease        Johnny Bridge, MD Cell [phone]   07/26/2019 9:36 AM

## 2019-07-26 NOTE — Op Note (Signed)
07/26/2019  12:47 PM  PATIENT:  Gavin Maxwell   MRN: [id number]  PRE-OPERATIVE DIAGNOSIS: Right hip avascular necrosis with collapse  POST-OPERATIVE DIAGNOSIS:  same  PROCEDURE:  Procedure(s): TOTAL HIP ARTHROPLASTY  PREOPERATIVE INDICATIONS:    Gavin Maxwell is an 74 y.o. male who has a diagnosis of right hip avascular necrosis with collapse and elected for surgical management after failing conservative treatment.  The risks benefits and alternatives were discussed with the patient including but not limited to the risks of nonoperative treatment, versus surgical intervention including infection, bleeding, nerve injury, periprosthetic fracture, the need for revision surgery, dislocation, leg length discrepancy, blood clots, cardiopulmonary complications, morbidity, mortality, among others, and they were willing to proceed.     OPERATIVE REPORT     SURGEON:  Marchia Bond, MD    ASSISTANT:  Merlene Pulling, PA-C, (Present throughout the entire procedure,  necessary for completion of procedure in a timely manner, assisting with retraction, instrumentation, and closure)     ANESTHESIA: Spinal  ESTIMATED BLOOD LOSS: 027 mL    COMPLICATIONS:  None.     UNIQUE ASPECTS OF THE CASE: Femoral head collapsed during dislocation, and fragmented, and fell apart.  He was extremely short preoperatively.  I had about 2 or 3 mm of native anterior wall exposed after acetabular implantation, reflecting appropriate anteversion.  The femoral head measured 49, I put in a 52 cup.  I had reasonably good bleeding bone.  The acetabular screw did not have substantial purchase, but it was adequate, and my press-fit was actually fairly good.  I could possibly have gotten up to a 54, but did not feel that it was necessary.  On the femoral side, I did not really get any broach restriction until I got to the 5, and this was still loose, and then I reamed up to a 6.  I could not ream higher than the 6, and the 6  trial had excellent rotational stability.  COMPONENTS:  [employer] fit femur size 6 with a 36 mm +5 metallic head ball and a Gription Acetabular shell size 52, with a single cancellous screw for backup fixation, with an apex hole eliminator and a +4 neutral polyethylene liner.    PROCEDURE IN DETAIL:   The patient was met in the holding area and  identified.  The appropriate hip was identified and marked at the operative site.  The patient was then transported to the OR  and  placed under anesthesia.  At that point, the patient was  placed in the lateral decubitus position with the operative side up and  secured to the operating room table and all bony prominences padded.     The operative lower extremity was prepped from the iliac crest to the distal leg.  Sterile draping was performed.  Time out was performed prior to incision.      A routine posterolateral approach was utilized via sharp dissection  carried down to the subcutaneous tissue.  Gross bleeders were Bovie coagulated.  The iliotibial band was identified and incised along the length of the skin incision.  Self-retaining retractors were  inserted.  With the hip internally rotated, the short external rotators  were identified. The piriformis and capsule was tagged with FiberWire, and the hip capsule released in a T-type fashion.  The femoral neck was exposed, and I resected the femoral neck using the appropriate jig. This was performed at approximately a thumb's breadth above the lesser trochanter.    I  then exposed the deep acetabulum, cleared out any tissue including the ligamentum teres.  A wing retractor was placed.  After adequate visualization, I excised the labrum, and then sequentially reamed.  I placed the trial acetabulum, which seated nicely, and then impacted the real cup into place.  Appropriate version and inclination was confirmed clinically matching their bony anatomy, and also with the use of the jig.  I placed  a cancellous screw to augment fixation.  A trial polyethylene liner was placed and the wing retractor removed.    I then prepared the proximal femur using the cookie-cutter, the lateralizing reamer, and then sequentially reamed and broached.  A trial broach, neck, and head was utilized, and I reduced the hip and it was found to have excellent stability with functional range of motion. The trial components were then removed, and the real polyethylene liner was placed.  I then impacted the real femoral prosthesis into place into the appropriate version, slightly anteverted to the normal anatomy, and I impacted the real head ball into place. The hip was then reduced and taken through functional range of motion and found to have excellent stability. Leg lengths were restored.  I then used a 2 mm drill bits to pass the FiberWire suture from the capsule and piriformis through the greater trochanter, and secured this. Excellent posterior capsular repair was achieved. I also closed the T in the capsule.  I then irrigated the hip copiously again with pulse lavage, and repaired the fascia with Vicryl, followed by Vicryl for the subcutaneous tissue, Monocryl for the skin, Steri-Strips and sterile gauze. The wounds were injected. The patient was then awakened and returned to PACU in stable and satisfactory condition. There were no complications.  Marchia Bond, MD Orthopedic Surgeon [phone]   07/26/2019 12:47 PM

## 2019-07-27 ENCOUNTER — Encounter: Payer: Self-pay | Admitting: *Deleted

## 2019-07-27 ENCOUNTER — Other Ambulatory Visit: Payer: Self-pay

## 2019-07-27 DIAGNOSIS — M879 Osteonecrosis, unspecified: Secondary | ICD-10-CM | POA: Diagnosis not present

## 2019-07-27 LAB — CBC
HCT: 30.4 % — ABNORMAL LOW (ref 39.0–52.0)
Hemoglobin: 9.9 g/dL — ABNORMAL LOW (ref 13.0–17.0)
MCH: 32.4 pg (ref 26.0–34.0)
MCHC: 32.6 g/dL (ref 30.0–36.0)
MCV: 99.3 fL (ref 80.0–100.0)
Platelets: 178 10*3/uL (ref 150–400)
RBC: 3.06 MIL/uL — ABNORMAL LOW (ref 4.22–5.81)
RDW: 12.6 % (ref 11.5–15.5)
WBC: 6.3 10*3/uL (ref 4.0–10.5)
nRBC: 0 % (ref 0.0–0.2)

## 2019-07-27 LAB — BASIC METABOLIC PANEL
Anion gap: 10 (ref 5–15)
BUN: 40 mg/dL — ABNORMAL HIGH (ref 8–23)
CO2: 22 mmol/L (ref 22–32)
Calcium: 8.4 mg/dL — ABNORMAL LOW (ref 8.9–10.3)
Chloride: 103 mmol/L (ref 98–111)
Creatinine, Ser: 2.09 mg/dL — ABNORMAL HIGH (ref 0.61–1.24)
GFR calc Af Amer: 35 mL/min — ABNORMAL LOW (ref >60)
GFR calc non Af Amer: 30 mL/min — ABNORMAL LOW (ref >60)
Glucose, Bld: 124 mg/dL — ABNORMAL HIGH (ref 70–99)
Potassium: 4.2 mmol/L (ref 3.5–5.1)
Sodium: 135 mmol/L (ref 135–145)

## 2019-07-27 NOTE — Progress Notes (Signed)
Physical Therapy Treatment Patient Details Name: Gavin Maxwell MRN: [id number] DOB: 07-12-1945 Today's Date: 07/27/2019    History of Present Illness R posterior THA; PMH of R reverse TSA 2015, R TKA 2016, lymphoma    PT Comments    Pt ambulated 50' with RW, distance limited by pain/fatigue. Instructed pt in HEP and reviewed posterior hip precautions. Will plan second session to do stair training.   Follow Up Recommendations  Follow surgeon's recommendation for DC plan and follow-up therapies     Equipment Recommendations  None recommended by PT    Recommendations for Other Services       Precautions / Restrictions Precautions Precautions: Posterior Hip Precaution Booklet Issued: Yes (comment) Precaution Comments: pt able to recall 2 of 3 posterior hip precautions; sign hung in room; reviewed precautions in detail Restrictions Weight Bearing Restrictions: No Other Position/Activity Restrictions: WBAT    Mobility  Bed Mobility Overal bed mobility: Needs Assistance Bed Mobility: Supine to Sit     Supine to sit: Min assist     General bed mobility comments: min A to raise trunk  Transfers Overall transfer level: Needs assistance Equipment used: Rolling walker (2 wheeled) Transfers: Sit to/from Stand Sit to Stand: From elevated surface;Min guard         General transfer comment: VCs hand placement  Ambulation/Gait Ambulation/Gait assistance: Min guard Gait Distance (Feet): 50 Feet Assistive device: Rolling walker (2 wheeled) Gait Pattern/deviations: Step-to pattern;Decreased stride length Gait velocity: decr   General Gait Details: VCs sequencing, no loss of balance, distance limited by pain   Stairs             Wheelchair Mobility    Modified Rankin (Stroke Patients Only)       Balance Overall balance assessment: Modified Independent                                          Cognition Arousal/Alertness:  Awake/alert Behavior During Therapy: WFL for tasks assessed/performed Overall Cognitive Status: Within Functional Limits for tasks assessed                                        Exercises Total Joint Exercises Ankle Circles/Pumps: AAROM;Both;15 reps;Supine Quad Sets: AROM;Right;5 reps;Supine Short Arc Quad: AROM;Right;10 reps;Supine Heel Slides: Right;10 reps;Supine;AAROM Hip ABduction/ADduction: AAROM;Right;5 reps;Supine    General Comments        Pertinent Vitals/Pain Pain Score: 7  Pain Location: R hip Pain Descriptors / Indicators: Sore Pain Intervention(s): Limited activity within patient's tolerance;Monitored during session;Patient requesting pain meds-RN notified;RN gave pain meds during session;Ice applied    Home Living                      Prior Function            PT Goals (current goals can now be found in the care plan section) Acute Rehab PT Goals Patient Stated Goal: to walk PT Goal Formulation: With patient Time For Goal Achievement: 08/02/19 Potential to Achieve Goals: Good Progress towards PT goals: Progressing toward goals    Frequency    7X/week      PT Plan Current plan remains appropriate    Co-evaluation              AM-PAC PT "6 Clicks" Mobility  Outcome Measure  Help needed turning from your back to your side while in a flat bed without using bedrails?: A Little Help needed moving from lying on your back to sitting on the side of a flat bed without using bedrails?: A Little Help needed moving to and from a bed to a chair (including a wheelchair)?: A Little Help needed standing up from a chair using your arms (e.g., wheelchair or bedside chair)?: A Little Help needed to walk in hospital room?: A Little Help needed climbing 3-5 steps with a railing? : A Lot 6 Click Score: 17    End of Session Equipment Utilized During Treatment: Gait belt Activity Tolerance: Patient tolerated treatment well Patient  left: in chair;with call bell/phone within reach;with chair alarm set Nurse Communication: Mobility status PT Visit Diagnosis: Difficulty in walking, not elsewhere classified (R26.2);Pain Pain - Right/Left: Right Pain - part of body: Hip     Time: [id number] PT Time Calculation (min) (ACUTE ONLY): 27 min  Charges:  $Gait Training: 8-22 mins $Therapeutic Exercise: 8-22 mins                    Blondell Reveal Kistler PT 07/27/2019  Acute Rehabilitation Services Pager [phone] Office [phone]

## 2019-07-27 NOTE — Discharge Summary (Signed)
Discharge Summary  Patient ID: Gavin Maxwell MRN: [id number] DOB/AGE: 11-30-1945 74 y.o.  Admit date: 07/26/2019 Discharge date: 07/27/2019  Admission Diagnoses:  Osteoarthritis of right hip  Discharge Diagnoses:  Principal Problem:   Osteoarthritis of right hip Active Problems:   S/P total hip arthroplasty   Past Medical History:  Diagnosis Date  . Anxiety   . Cancer [facility])    non hodgins  lymphoma   2004 in remission  Left arm  wears a brace there is a bone broken  . Depression   . Dysrhythmia    SVT  . GERD (gastroesophageal reflux disease)   . History of hiatal hernia   . Hypertension   . Pneumonia   . Primary localized osteoarthritis of knee 09/26/2014  . Primary localized osteoarthrosis, shoulder region, rotator cuff arthropathy 10/04/2013  . Sleep apnea    cpap    Surgeries: Procedure(s): TOTAL HIP ARTHROPLASTY on 07/26/2019   Consultants (if any):   Discharged Condition: Improved  Hospital Course: Gavin Maxwell is an 74 y.o. male who was admitted 07/26/2019 with a diagnosis of Osteoarthritis of right hip and went to the operating room on 07/26/2019 and underwent the above named procedures.    He was given perioperative antibiotics:  Anti-infectives (From admission, onward)   Start     Dose/Rate Route Frequency Ordered Stop   07/26/19 1700  ceFAZolin (ANCEF) IVPB 2g/100 mL premix     2 g 200 mL/hr over 30 Minutes Intravenous Every 6 hours 07/26/19 1445 07/26/19 2350   07/26/19 0800  ceFAZolin (ANCEF) IVPB 2g/100 mL premix     2 g 200 mL/hr over 30 Minutes Intravenous On call to O.R. 07/26/19 0757 07/26/19 1037    .  He was given sequential compression devices, early ambulation, and aspirin for DVT prophylaxis.  He benefited maximally from the hospital stay and there were no complications.    Recent vital signs:  Vitals:   07/27/19 0829 07/27/19 0942  BP:  (!) 104/58  Pulse:  73  Resp:  16  Temp:  98.1 F (36.7 C)  SpO2: 95% 100%    Recent  laboratory studies:  Lab Results  Component Value Date   HGB 9.9 (L) 07/27/2019   HGB 12.0 (L) 07/18/2019   HGB 11.0 (L) 03/07/2019   Lab Results  Component Value Date   WBC 6.3 07/27/2019   PLT 178 07/27/2019   No results found for: INR Lab Results  Component Value Date   NA 135 07/27/2019   K 4.2 07/27/2019   CL 103 07/27/2019   CO2 22 07/27/2019   BUN 40 (H) 07/27/2019   CREATININE 2.09 (H) 07/27/2019   GLUCOSE 124 (H) 07/27/2019    Discharge Medications:   Allergies as of 07/27/2019      Reactions   Augmentin [amoxicillin-pot Clavulanate] Nausea Only   Did it involve swelling of the face/tongue/throat, SOB, or low BP? No Did it involve sudden or severe rash/hives, skin peeling, or any reaction on the inside of your mouth or nose? No Did you need to seek medical attention at a hospital or doctor's office? No When did it last happen? More than 10 years If all above answers are "NO", may proceed with cephalosporin use.   Celebrex [celecoxib] Nausea Only   Oxycodone Other (See Comments)   Confusion, makes him "crazy"      Medication List    STOP taking these medications   acetaminophen 500 MG tablet Commonly known as: TYLENOL   HYDROcodone-acetaminophen  5-325 MG tablet Commonly known as: NORCO/VICODIN Replaced by: HYDROcodone-acetaminophen 10-325 MG tablet   naproxen sodium 220 MG tablet Commonly known as: ALEVE     TAKE these medications   Advair HFA 115-21 MCG/ACT inhaler Generic drug: fluticasone-salmeterol Inhale 2 puffs into the lungs 2 (two) times daily.   albuterol 108 (90 Base) MCG/ACT inhaler Commonly known as: VENTOLIN HFA Inhale 2 puffs into the lungs every 6 (six) hours as needed for wheezing or shortness of breath.   aspirin EC 325 MG tablet Take 1 tablet (325 mg total) by mouth 2 (two) times daily. What changed:   medication strength  how much to take  when to take this   atorvastatin 80 MG tablet Commonly known as: LIPITOR Take 80  mg by mouth daily with supper.   baclofen 10 MG tablet Commonly known as: LIORESAL Take 1 tablet (10 mg total) by mouth 3 (three) times daily. As needed for muscle spasm   CALCIUM 600 PO Take 600 mg by mouth 2 (two) times daily.   fenofibrate 160 MG tablet Take 160 mg by mouth daily.   Fiber-Lax 625 MG tablet Generic drug: polycarbophil Take 625 mg by mouth daily.   fluticasone 50 MCG/ACT nasal spray Commonly known as: FLONASE Place 2 sprays into both nostrils daily.   gabapentin 300 MG capsule Commonly known as: NEURONTIN Take 300 mg by mouth at bedtime.   HYDROcodone-acetaminophen 10-325 MG tablet Commonly known as: Norco Take 1 tablet by mouth every 6 (six) hours as needed. Replaces: HYDROcodone-acetaminophen 5-325 MG tablet   Iron Supplement 325 (65 FE) MG tablet Generic drug: ferrous sulfate Take 325 mg by mouth daily.   magnesium oxide 400 MG tablet Commonly known as: MAG-OX Take 400 mg by mouth 2 (two) times daily.   metoprolol succinate 25 MG 24 hr tablet Commonly known as: TOPROL-XL Take 25 mg by mouth every evening.   multivitamin with minerals Tabs tablet Take 1 tablet by mouth daily.   ondansetron 4 MG tablet Commonly known as: Zofran Take 1 tablet (4 mg total) by mouth every 8 (eight) hours as needed for nausea or vomiting.   pantoprazole 40 MG tablet Commonly known as: PROTONIX Take 40 mg by mouth daily.   sennosides-docusate sodium 8.6-50 MG tablet Commonly known as: SENOKOT-S Take 2 tablets by mouth daily.   sertraline 50 MG tablet Commonly known as: ZOLOFT Take 50 mg by mouth at bedtime.       Diagnostic Studies: DG Pelvis Portable  Result Date: 07/26/2019 CLINICAL DATA:  74 year old male status post right hip arthroplasty. EXAM: PORTABLE PELVIS 1-2 VIEWS; DG HIP (WITH OR WITHOUT PELVIS) 1V PORT RIGHT COMPARISON:  Pelvic radiograph dated 03/28/2019. FINDINGS: There are bilateral total hip arthroplasties. The arthroplasty components  appear intact and in anatomic alignment. No acute fracture or dislocation identified. Postsurgical changes in the soft tissues of the right hip. IMPRESSION: Bilateral total hip arthroplasties without complication. Electronically Signed   By: Anner Crete M.D.   On: 07/26/2019 15:50   DG Hip Port Unilat With Pelvis 1V Right  Result Date: 07/26/2019 CLINICAL DATA:  74 year old male status post right hip arthroplasty. EXAM: PORTABLE PELVIS 1-2 VIEWS; DG HIP (WITH OR WITHOUT PELVIS) 1V PORT RIGHT COMPARISON:  Pelvic radiograph dated 03/28/2019. FINDINGS: There are bilateral total hip arthroplasties. The arthroplasty components appear intact and in anatomic alignment. No acute fracture or dislocation identified. Postsurgical changes in the soft tissues of the right hip. IMPRESSION: Bilateral total hip arthroplasties without complication. Electronically Signed  By: Anner Crete M.D.   On: 07/26/2019 15:50    Disposition: Discharge disposition: 01-Home or Self Care         Follow-up Information    Marchia Bond, MD. Schedule an appointment as soon as possible for a visit in 2 weeks.   Specialty: Orthopedic Surgery Contact information: [address] [place] [address] [phone]        [facility] Follow up.   Specialty: Home Health Services Why: Agency will you call you before the first visit.  Contact information: [address] [place] [address] [phone]            Signed: Ventura Bruns PA-C 07/27/2019, 1:51 PM

## 2019-07-27 NOTE — Progress Notes (Addendum)
Physical Therapy Treatment Patient Details Name: Gavin Maxwell MRN: [id number] DOB: 14-Aug-1945 Today's Date: 07/27/2019    History of Present Illness R posterior THA; PMH of R reverse TSA 2015, R TKA 2016, lymphoma    PT Comments    Pt is ready to DC home from PT standpoint. He ambulated 120', completed stair training, and demonstrates good understanding of HEP.  Follow Up Recommendations  Follow surgeon's recommendation for DC plan and follow-up therapies     Equipment Recommendations  None recommended by PT    Recommendations for Other Services       Precautions / Restrictions Precautions Precautions: Posterior Hip Precaution Booklet Issued: Yes (comment) Precaution Comments: pt able to recall 2 of 3 posterior hip precautions; sign hung in room; reviewed precautions in detail Restrictions Weight Bearing Restrictions: No Other Position/Activity Restrictions: WBAT    Mobility  Bed Mobility Overal bed mobility: Needs Assistance Bed Mobility: Supine to Sit     Supine to sit: Min assist     General bed mobility comments: up in recliner  Transfers Overall transfer level: Needs assistance Equipment used: Rolling walker (2 wheeled) Transfers: Sit to/from Stand Sit to Stand: From elevated surface;Supervision         General transfer comment: VCs hand placement  Ambulation/Gait Ambulation/Gait assistance: Min guard Gait Distance (Feet): 120 Feet Assistive device: Rolling walker (2 wheeled) Gait Pattern/deviations: Step-to pattern;Decreased stride length Gait velocity: decr   General Gait Details: VCs sequencing, no loss of balance, distance limited by pain   Stairs Stairs: Yes Stairs assistance: Min assist Stair Management: No rails;Backwards;With walker Number of Stairs: 2 General stair comments: VCs sequencing, min A to manage RW   Wheelchair Mobility    Modified Rankin (Stroke Patients Only)       Balance Overall balance assessment: Modified  Independent                                          Cognition Arousal/Alertness: Awake/alert Behavior During Therapy: WFL for tasks assessed/performed Overall Cognitive Status: Within Functional Limits for tasks assessed                                           General Comments        Pertinent Vitals/Pain Pain Score: 5  Pain Location: R hip Pain Descriptors / Indicators: Sore Pain Intervention(s): Limited activity within patient's tolerance;Monitored during session;Premedicated before session;Ice applied    Home Living                      Prior Function            PT Goals (current goals can now be found in the care plan section) Acute Rehab PT Goals Patient Stated Goal: to walk PT Goal Formulation: With patient Time For Goal Achievement: 08/02/19 Potential to Achieve Goals: Good Progress towards PT goals: Progressing toward goals    Frequency    7X/week      PT Plan Current plan remains appropriate    Co-evaluation              AM-PAC PT "6 Clicks" Mobility   Outcome Measure  Help needed turning from your back to your side while in a flat bed without using bedrails?: A Little Help needed  moving from lying on your back to sitting on the side of a flat bed without using bedrails?: A Little Help needed moving to and from a bed to a chair (including a wheelchair)?: A Little Help needed standing up from a chair using your arms (e.g., wheelchair or bedside chair)?: A Little Help needed to walk in hospital room?: A Little Help needed climbing 3-5 steps with a railing? : A Lot 6 Click Score: 17    End of Session Equipment Utilized During Treatment: Gait belt Activity Tolerance: Patient tolerated treatment well Patient left: in chair;with call bell/phone within reach;with chair alarm set Nurse Communication: Mobility status PT Visit Diagnosis: Difficulty in walking, not elsewhere classified (R26.2);Pain Pain  - Right/Left: Right Pain - part of body: Hip     Time: [id number] PT Time Calculation (min) (ACUTE ONLY): 21 min  Charges:  $Gait Training: 8-22 mins                     Blondell Reveal Kistler PT 07/27/2019  Acute Rehabilitation Services Pager [phone] Office [phone]

## 2019-07-27 NOTE — Progress Notes (Signed)
Subjective: 1 Day Post-Op s/p Procedure(s): TOTAL HIP ARTHROPLASTY   Patient is alert, oriented, sitting up comfortably in bed. Patient reports pain as mild. Denies chest pain, SOB, Calf pain. No nausea/vomiting. No other complaints, tolerating PO well. Ambulated 14' with therapy yesterday.   Objective:  PE: VITALS:   Vitals:   07/26/19 1940 07/26/19 2131 07/27/19 0129 07/27/19 0514  BP:  (!) 111/59 109/61 116/64  Pulse:  80 78 71  Resp:  19 19 18   Temp:  97.8 F (36.6 C) 97.7 F (36.5 C) (!) 97.5 F (36.4 C)  TempSrc:  Oral Oral   SpO2: 98% 100% 99% 100%  Weight:      Height:        ABD soft Neurovascular intact Sensation intact distally Intact pulses distally Dorsiflexion/Plantar flexion intact Incision: scant drainage  LABS  Results for orders placed or performed during the hospital encounter of 07/26/19 (from the past 24 hour(s))  CBC     Status: Abnormal   Collection Time: 07/27/19  3:16 AM  Result Value Ref Range   WBC 6.3 4.0 - 10.5 K/uL   RBC 3.06 (L) 4.22 - 5.81 MIL/uL   Hemoglobin 9.9 (L) 13.0 - 17.0 g/dL   HCT 30.4 (L) 39.0 - 52.0 %   MCV 99.3 80.0 - 100.0 fL   MCH 32.4 26.0 - 34.0 pg   MCHC 32.6 30.0 - 36.0 g/dL   RDW 12.6 11.5 - 15.5 %   Platelets 178 150 - 400 K/uL   nRBC 0.0 0.0 - 0.2 %  Basic metabolic panel     Status: Abnormal   Collection Time: 07/27/19  3:16 AM  Result Value Ref Range   Sodium 135 135 - 145 mmol/L   Potassium 4.2 3.5 - 5.1 mmol/L   Chloride 103 98 - 111 mmol/L   CO2 22 22 - 32 mmol/L   Glucose, Bld 124 (H) 70 - 99 mg/dL   BUN 40 (H) 8 - 23 mg/dL   Creatinine, Ser 2.09 (H) 0.61 - 1.24 mg/dL   Calcium 8.4 (L) 8.9 - 10.3 mg/dL   GFR calc non Af Amer 30 (L) >60 mL/min   GFR calc Af Amer 35 (L) >60 mL/min   Anion gap 10 5 - 15    DG Pelvis Portable  Result Date: 07/26/2019 CLINICAL DATA:  74 year old male status post right hip arthroplasty. EXAM: PORTABLE PELVIS 1-2 VIEWS; DG HIP (WITH OR WITHOUT PELVIS) 1V PORT  RIGHT COMPARISON:  Pelvic radiograph dated 03/28/2019. FINDINGS: There are bilateral total hip arthroplasties. The arthroplasty components appear intact and in anatomic alignment. No acute fracture or dislocation identified. Postsurgical changes in the soft tissues of the right hip. IMPRESSION: Bilateral total hip arthroplasties without complication. Electronically Signed   By: Anner Crete M.D.   On: 07/26/2019 15:50   DG Hip Port Unilat With Pelvis 1V Right  Result Date: 07/26/2019 CLINICAL DATA:  74 year old male status post right hip arthroplasty. EXAM: PORTABLE PELVIS 1-2 VIEWS; DG HIP (WITH OR WITHOUT PELVIS) 1V PORT RIGHT COMPARISON:  Pelvic radiograph dated 03/28/2019. FINDINGS: There are bilateral total hip arthroplasties. The arthroplasty components appear intact and in anatomic alignment. No acute fracture or dislocation identified. Postsurgical changes in the soft tissues of the right hip. IMPRESSION: Bilateral total hip arthroplasties without complication. Electronically Signed   By: Anner Crete M.D.   On: 07/26/2019 15:50    Assessment/Plan: Principal Problem:   Osteoarthritis of right hip Active Problems:   S/P total hip  arthroplasty  1 Day Post-Op s/p Procedure(s): TOTAL HIP ARTHROPLASTY - overall doing well, ready to go home today - will plan for one more PT visit prior to discharge  Weightbearing: WBAT RLE Insicional and dressing care: ok to leave current dressing intact at discharge VTE prophylaxis: Aspirin 325mg  BID x 30 days Pain control: will discharge with norco 10/325 Follow - up plan: Follow up with Dr. Mardelle Matte in 2 weeks Dispo: Plan to discharge home today if continue good progress with PT session this morning  Contact information:   Weekdays 8-5 Merlene Pulling, PA-C [phone] A fter hours and holidays please check [email or web address] for group call information for [facility]  Ventura Bruns 07/27/2019, 8:17 AM

## 2019-07-27 NOTE — Discharge Planning (Deleted)
Discharge Summary  Patient ID: UTAH WELDEN MRN: [id number] DOB/AGE: [birth year] 74 y.o.  Admit date: 07/26/2019 Discharge date: 07/27/2019  Admission Diagnoses:  Osteoarthritis of right hip  Discharge Diagnoses:  Principal Problem:   Osteoarthritis of right hip Active Problems:   S/P total hip arthroplasty   Past Medical History:  Diagnosis Date  . Anxiety   . Cancer [facility])    non hodgins  lymphoma   2004 in remission  Left arm  wears a brace there is a bone broken  . Depression   . Dysrhythmia    SVT  . GERD (gastroesophageal reflux disease)   . History of hiatal hernia   . Hypertension   . Pneumonia   . Primary localized osteoarthritis of knee 09/26/2014  . Primary localized osteoarthrosis, shoulder region, rotator cuff arthropathy 10/04/2013  . Sleep apnea    cpap    Surgeries: Procedure(s): TOTAL HIP ARTHROPLASTY on 07/26/2019   Consultants (if any):   Discharged Condition: Improved  Hospital Course: Gavin Maxwell is an 74 y.o. male who was admitted 07/26/2019 with a diagnosis of Osteoarthritis of right hip and went to the operating room on 07/26/2019 and underwent the above named procedures.    He was given perioperative antibiotics:  Anti-infectives (From admission, onward)   Start     Dose/Rate Route Frequency Ordered Stop   07/26/19 1700  ceFAZolin (ANCEF) IVPB 2g/100 mL premix     2 g 200 mL/hr over 30 Minutes Intravenous Every 6 hours 07/26/19 1445 07/26/19 2350   07/26/19 0800  ceFAZolin (ANCEF) IVPB 2g/100 mL premix     2 g 200 mL/hr over 30 Minutes Intravenous On call to O.R. 07/26/19 0757 07/26/19 1037    .  He was given sequential compression devices, early ambulation, and aspirin for DVT prophylaxis.  He benefited maximally from the hospital stay and there were no complications.    Recent vital signs:  Vitals:   07/27/19 0129 07/27/19 0514  BP: 109/61 116/64  Pulse: 78 71  Resp: 19 18  Temp: 97.7 F (36.5 C) (!) 97.5 F (36.4 C)  SpO2:  99% 100%    Recent laboratory studies:  Lab Results  Component Value Date   HGB 9.9 (L) 07/27/2019   HGB 12.0 (L) 07/18/2019   HGB 11.0 (L) 03/07/2019   Lab Results  Component Value Date   WBC 6.3 07/27/2019   PLT 178 07/27/2019   No results found for: INR Lab Results  Component Value Date   NA 135 07/27/2019   K 4.2 07/27/2019   CL 103 07/27/2019   CO2 22 07/27/2019   BUN 40 (H) 07/27/2019   CREATININE 2.09 (H) 07/27/2019   GLUCOSE 124 (H) 07/27/2019    Discharge Medications:   Allergies as of 07/27/2019      Reactions   Augmentin [amoxicillin-pot Clavulanate] Nausea Only   Did it involve swelling of the face/tongue/throat, SOB, or low BP? No Did it involve sudden or severe rash/hives, skin peeling, or any reaction on the inside of your mouth or nose? No Did you need to seek medical attention at a hospital or doctor's office? No When did it last happen? More than 10 years If all above answers are "NO", may proceed with cephalosporin use.   Celebrex [celecoxib] Nausea Only   Oxycodone Other (See Comments)   Confusion, makes him "crazy"      Medication List    STOP taking these medications   acetaminophen 500 MG tablet Commonly known as: TYLENOL  HYDROcodone-acetaminophen 5-325 MG tablet Commonly known as: NORCO/VICODIN Replaced by: HYDROcodone-acetaminophen 10-325 MG tablet   naproxen sodium 220 MG tablet Commonly known as: ALEVE     TAKE these medications   Advair HFA 115-21 MCG/ACT inhaler Generic drug: fluticasone-salmeterol Inhale 2 puffs into the lungs 2 (two) times daily.   albuterol 108 (90 Base) MCG/ACT inhaler Commonly known as: VENTOLIN HFA Inhale 2 puffs into the lungs every 6 (six) hours as needed for wheezing or shortness of breath.   aspirin EC 325 MG tablet Take 1 tablet (325 mg total) by mouth 2 (two) times daily. What changed:   medication strength  how much to take  when to take this   atorvastatin 80 MG tablet Commonly  known as: LIPITOR Take 80 mg by mouth daily with supper.   baclofen 10 MG tablet Commonly known as: LIORESAL Take 1 tablet (10 mg total) by mouth 3 (three) times daily. As needed for muscle spasm   CALCIUM 600 PO Take 600 mg by mouth 2 (two) times daily.   fenofibrate 160 MG tablet Take 160 mg by mouth daily.   Fiber-Lax 625 MG tablet Generic drug: polycarbophil Take 625 mg by mouth daily.   fluticasone 50 MCG/ACT nasal spray Commonly known as: FLONASE Place 2 sprays into both nostrils daily.   gabapentin 300 MG capsule Commonly known as: NEURONTIN Take 300 mg by mouth at bedtime.   HYDROcodone-acetaminophen 10-325 MG tablet Commonly known as: Norco Take 1 tablet by mouth every 6 (six) hours as needed. Replaces: HYDROcodone-acetaminophen 5-325 MG tablet   Iron Supplement 325 (65 FE) MG tablet Generic drug: ferrous sulfate Take 325 mg by mouth daily.   magnesium oxide 400 MG tablet Commonly known as: MAG-OX Take 400 mg by mouth 2 (two) times daily.   metoprolol succinate 25 MG 24 hr tablet Commonly known as: TOPROL-XL Take 25 mg by mouth every evening.   multivitamin with minerals Tabs tablet Take 1 tablet by mouth daily.   ondansetron 4 MG tablet Commonly known as: Zofran Take 1 tablet (4 mg total) by mouth every 8 (eight) hours as needed for nausea or vomiting.   pantoprazole 40 MG tablet Commonly known as: PROTONIX Take 40 mg by mouth daily.   sennosides-docusate sodium 8.6-50 MG tablet Commonly known as: SENOKOT-S Take 2 tablets by mouth daily.   sertraline 50 MG tablet Commonly known as: ZOLOFT Take 50 mg by mouth at bedtime.       Diagnostic Studies: DG Pelvis Portable  Result Date: 07/26/2019 CLINICAL DATA:  74 year old male status post right hip arthroplasty. EXAM: PORTABLE PELVIS 1-2 VIEWS; DG HIP (WITH OR WITHOUT PELVIS) 1V PORT RIGHT COMPARISON:  Pelvic radiograph dated 03/28/2019. FINDINGS: There are bilateral total hip arthroplasties. The  arthroplasty components appear intact and in anatomic alignment. No acute fracture or dislocation identified. Postsurgical changes in the soft tissues of the right hip. IMPRESSION: Bilateral total hip arthroplasties without complication. Electronically Signed   By: Anner Crete M.D.   On: 07/26/2019 15:50   DG Hip Port Unilat With Pelvis 1V Right  Result Date: 07/26/2019 CLINICAL DATA:  74 year old male status post right hip arthroplasty. EXAM: PORTABLE PELVIS 1-2 VIEWS; DG HIP (WITH OR WITHOUT PELVIS) 1V PORT RIGHT COMPARISON:  Pelvic radiograph dated 03/28/2019. FINDINGS: There are bilateral total hip arthroplasties. The arthroplasty components appear intact and in anatomic alignment. No acute fracture or dislocation identified. Postsurgical changes in the soft tissues of the right hip. IMPRESSION: Bilateral total hip arthroplasties without complication. Electronically Signed  By: Anner Crete M.D.   On: 07/26/2019 15:50    Disposition: Discharge disposition: 01-Home or Self Care         Follow-up Information    Marchia Bond, MD. Schedule an appointment as soon as possible for a visit in 2 weeks.   Specialty: Orthopedic Surgery Contact information: [address] [place] [address] [phone]            Signed: Jola Baptist 07/27/2019, 8:20 AM

## 2019-07-27 NOTE — TOC Progression Note (Signed)
Transition of Care [facility]) - Progression Note    Patient Details  Name: Gavin Maxwell MRN: [id number] Date of Birth: 1945/11/24  Transition of Care [facility]) CM/SW [place], [place] Phone Number: 07/27/2019, 11:14 AM  Clinical Narrative:    Patient admitted for total hip arthroplasty. CSW notified the patient his therapy plan is HHPT-with [facility]. Therapy plan was prearranged in the Orthopedic office. Patient report understanding Patient has 3 IN 1 and RW    Expected Discharge Plan: [place] Barriers to Discharge: No Barriers Identified  Expected Discharge Plan and Services Expected Discharge Plan: [place]         Expected Discharge Date: 07/27/19               DME Arranged: (HAS DME-RW and 3 in 1)         HH Arranged: PT HH Agency: [facility] [employer] Date [place]: 07/27/19 Time [place]: 0930 Representative spoke with at [place]: [place] ([place]) Interventions    Readmission Risk Interventions No flowsheet data found.

## 2019-07-29 DIAGNOSIS — F419 Anxiety disorder, unspecified: Secondary | ICD-10-CM | POA: Diagnosis not present

## 2019-07-29 DIAGNOSIS — Z96611 Presence of right artificial shoulder joint: Secondary | ICD-10-CM | POA: Diagnosis not present

## 2019-07-29 DIAGNOSIS — E785 Hyperlipidemia, unspecified: Secondary | ICD-10-CM | POA: Diagnosis not present

## 2019-07-29 DIAGNOSIS — D649 Anemia, unspecified: Secondary | ICD-10-CM | POA: Diagnosis not present

## 2019-07-29 DIAGNOSIS — K219 Gastro-esophageal reflux disease without esophagitis: Secondary | ICD-10-CM | POA: Diagnosis not present

## 2019-07-29 DIAGNOSIS — F329 Major depressive disorder, single episode, unspecified: Secondary | ICD-10-CM | POA: Diagnosis not present

## 2019-07-29 DIAGNOSIS — Z96651 Presence of right artificial knee joint: Secondary | ICD-10-CM | POA: Diagnosis not present

## 2019-07-29 DIAGNOSIS — Z471 Aftercare following joint replacement surgery: Secondary | ICD-10-CM | POA: Diagnosis not present

## 2019-07-29 DIAGNOSIS — I129 Hypertensive chronic kidney disease with stage 1 through stage 4 chronic kidney disease, or unspecified chronic kidney disease: Secondary | ICD-10-CM | POA: Diagnosis not present

## 2019-07-29 DIAGNOSIS — Z8572 Personal history of non-Hodgkin lymphomas: Secondary | ICD-10-CM | POA: Diagnosis not present

## 2019-07-29 DIAGNOSIS — G4730 Sleep apnea, unspecified: Secondary | ICD-10-CM | POA: Diagnosis not present

## 2019-07-29 DIAGNOSIS — Z96641 Presence of right artificial hip joint: Secondary | ICD-10-CM | POA: Diagnosis not present

## 2019-07-29 DIAGNOSIS — Z87891 Personal history of nicotine dependence: Secondary | ICD-10-CM | POA: Diagnosis not present

## 2019-07-29 DIAGNOSIS — N183 Chronic kidney disease, stage 3 unspecified: Secondary | ICD-10-CM | POA: Diagnosis not present

## 2019-07-29 DIAGNOSIS — Z7951 Long term (current) use of inhaled steroids: Secondary | ICD-10-CM | POA: Diagnosis not present

## 2019-07-29 DIAGNOSIS — I471 Supraventricular tachycardia: Secondary | ICD-10-CM | POA: Diagnosis not present

## 2019-08-08 DIAGNOSIS — Z96641 Presence of right artificial hip joint: Secondary | ICD-10-CM | POA: Diagnosis not present

## 2019-08-10 DIAGNOSIS — Z471 Aftercare following joint replacement surgery: Secondary | ICD-10-CM | POA: Diagnosis not present

## 2019-08-10 DIAGNOSIS — G4733 Obstructive sleep apnea (adult) (pediatric): Secondary | ICD-10-CM | POA: Diagnosis not present

## 2019-08-10 DIAGNOSIS — Z96611 Presence of right artificial shoulder joint: Secondary | ICD-10-CM | POA: Diagnosis not present

## 2019-08-10 DIAGNOSIS — F329 Major depressive disorder, single episode, unspecified: Secondary | ICD-10-CM | POA: Diagnosis not present

## 2019-08-10 DIAGNOSIS — I471 Supraventricular tachycardia: Secondary | ICD-10-CM | POA: Diagnosis not present

## 2019-08-10 DIAGNOSIS — Z87891 Personal history of nicotine dependence: Secondary | ICD-10-CM | POA: Diagnosis not present

## 2019-08-10 DIAGNOSIS — K219 Gastro-esophageal reflux disease without esophagitis: Secondary | ICD-10-CM | POA: Diagnosis not present

## 2019-08-10 DIAGNOSIS — Z96641 Presence of right artificial hip joint: Secondary | ICD-10-CM | POA: Diagnosis not present

## 2019-08-10 DIAGNOSIS — Z7951 Long term (current) use of inhaled steroids: Secondary | ICD-10-CM | POA: Diagnosis not present

## 2019-08-10 DIAGNOSIS — Z8572 Personal history of non-Hodgkin lymphomas: Secondary | ICD-10-CM | POA: Diagnosis not present

## 2019-08-10 DIAGNOSIS — F419 Anxiety disorder, unspecified: Secondary | ICD-10-CM | POA: Diagnosis not present

## 2019-08-10 DIAGNOSIS — N183 Chronic kidney disease, stage 3 unspecified: Secondary | ICD-10-CM | POA: Diagnosis not present

## 2019-08-10 DIAGNOSIS — G4730 Sleep apnea, unspecified: Secondary | ICD-10-CM | POA: Diagnosis not present

## 2019-08-10 DIAGNOSIS — I129 Hypertensive chronic kidney disease with stage 1 through stage 4 chronic kidney disease, or unspecified chronic kidney disease: Secondary | ICD-10-CM | POA: Diagnosis not present

## 2019-08-10 DIAGNOSIS — D649 Anemia, unspecified: Secondary | ICD-10-CM | POA: Diagnosis not present

## 2019-08-10 DIAGNOSIS — E785 Hyperlipidemia, unspecified: Secondary | ICD-10-CM | POA: Diagnosis not present

## 2019-08-10 DIAGNOSIS — Z96651 Presence of right artificial knee joint: Secondary | ICD-10-CM | POA: Diagnosis not present

## 2019-08-22 DIAGNOSIS — L03317 Cellulitis of buttock: Secondary | ICD-10-CM | POA: Diagnosis not present

## 2019-08-22 DIAGNOSIS — R238 Other skin changes: Secondary | ICD-10-CM | POA: Diagnosis not present

## 2019-08-30 DIAGNOSIS — H903 Sensorineural hearing loss, bilateral: Secondary | ICD-10-CM | POA: Diagnosis not present

## 2019-08-30 DIAGNOSIS — H6121 Impacted cerumen, right ear: Secondary | ICD-10-CM | POA: Diagnosis not present

## 2019-08-30 DIAGNOSIS — G4733 Obstructive sleep apnea (adult) (pediatric): Secondary | ICD-10-CM | POA: Diagnosis not present

## 2019-09-09 DIAGNOSIS — Z96641 Presence of right artificial hip joint: Secondary | ICD-10-CM | POA: Diagnosis not present

## 2019-09-19 ENCOUNTER — Encounter: Payer: Self-pay | Admitting: Urology

## 2019-09-19 ENCOUNTER — Other Ambulatory Visit: Payer: Self-pay

## 2019-09-19 ENCOUNTER — Ambulatory Visit: Payer: PPO | Admitting: Urology

## 2019-09-19 VITALS — BP 138/72 | HR 78 | Ht 71.0 in | Wt 235.0 lb

## 2019-09-19 DIAGNOSIS — R310 Gross hematuria: Secondary | ICD-10-CM

## 2019-09-19 LAB — URINALYSIS, COMPLETE
Bilirubin, UA: NEGATIVE
Glucose, UA: NEGATIVE
Ketones, UA: NEGATIVE
Leukocytes,UA: NEGATIVE
Nitrite, UA: NEGATIVE
Protein,UA: NEGATIVE
RBC, UA: NEGATIVE
Specific Gravity, UA: 1.02 (ref 1.005–1.030)
Urobilinogen, Ur: 1 mg/dL (ref 0.2–1.0)
pH, UA: 6.5 (ref 5.0–7.5)

## 2019-09-19 LAB — MICROSCOPIC EXAMINATION
Bacteria, UA: NONE SEEN
RBC, Urine: NONE SEEN /hpf (ref 0–2)

## 2019-09-19 NOTE — Progress Notes (Signed)
PATIENT ID: Gavin Maxwell, male     DOB: 10-13-1945, 74 y.o.     MRN: [id number]   ENCOUNTER: 09/19/19, 3:13 PM     REFERRING PROVIDER: Idelle Crouch, MD [facility] [place],  [place] [address]  Chief Complaint  Patient presents with  . Hematuria    Urologic history: 1.  Gross hematuria 04/2019 -CTU bilateral simple renal cysts -Cystoscopy moderate BPH/prominent hypervascularity -Negative cytology   HPI: Gavin Maxwell is a 74 y.o. male presenting today for a follow up of his history of hematuria. He was last seen on 04/15/2019.    He denies any recurrent hematuria since 02/2019.   Has been well overall  No bothersome LUTS  No flank, abdominal or pelvic pain  PMHx: Past Medical History:  Diagnosis Date  . Anxiety   . Cancer [facility])    non hodgins  lymphoma   2004 in remission  Left arm  wears a brace there is a bone broken  . Depression   . Dysrhythmia    SVT  . GERD (gastroesophageal reflux disease)   . History of hiatal hernia   . Hypertension   . Pneumonia   . Primary localized osteoarthritis of knee 09/26/2014  . Primary localized osteoarthrosis, shoulder region, rotator cuff arthropathy 10/04/2013  . Sleep apnea    cpap    SURGICAL HISTORY: Past Surgical History:  Procedure Laterality Date  . CARDIAC CATHETERIZATION     20 yrs. ago  . CHOLECYSTECTOMY    . EYE SURGERY    . JOINT REPLACEMENT     hip  . NASAL SINUS SURGERY     x2  . REVERSE SHOULDER ARTHROPLASTY Right 10/04/2013   Procedure: REVERSE SHOULDER ARTHROPLASTY;  Surgeon: Johnny Bridge, MD;  Location: [place];  Service: Orthopedics;  Laterality: Right;  . SHOULDER ACROMIOPLASTY     x 5 shoulder surgeries  . SVT ABLATION N/A 03/07/2019   Procedure: SVT ABLATION;  Surgeon: Constance Haw, MD;  Location: [place] CV LAB;  Service: Cardiovascular;  Laterality: N/A;  . TOTAL HIP ARTHROPLASTY Right 07/26/2019   Procedure: TOTAL HIP ARTHROPLASTY;  Surgeon:  Marchia Bond, MD;  Location: [facility];  Service: Orthopedics;  Laterality: Right;  . TOTAL KNEE ARTHROPLASTY Right 09/26/2014  . TOTAL KNEE ARTHROPLASTY Right 09/26/2014   Procedure: RIGHT TOTAL KNEE ARTHROPLASTY;  Surgeon: Marchia Bond, MD;  Location: [place];  Service: Orthopedics;  Laterality: Right;  Marland Kitchen VIDEO ASSISTED THORACOSCOPY (VATS) W/TALC PLEUADESIS Right 08/18/2017   Procedure: VIDEO ASSISTED THORACOSCOPY (VATS)POSSIBLE  W/TALC PLEUADESIS.POSSIBLE BLEBECTOMY;  Surgeon: Nestor Lewandowsky, MD;  Location: [facility];  Service: Thoracic;  Laterality: Right;    HOME MEDICATIONS:  Allergies as of 09/19/2019      Reactions   Augmentin [amoxicillin-pot Clavulanate] Nausea Only   Did it involve swelling of the face/tongue/throat, SOB, or low BP? No Did it involve sudden or severe rash/hives, skin peeling, or any reaction on the inside of your mouth or nose? No Did you need to seek medical attention at a hospital or doctor's office? No When did it last happen? More than 10 years If all above answers are "NO", may proceed with cephalosporin use.   Celebrex [celecoxib] Nausea Only   Oxycodone Other (See Comments)   Confusion, makes him "crazy"      Medication List       Accurate as of September 19, 2019  3:13 PM. If you have any questions, ask your nurse or doctor.  Advair HFA 115-21 MCG/ACT inhaler Generic drug: fluticasone-salmeterol Inhale 2 puffs into the lungs 2 (two) times daily.   albuterol 108 (90 Base) MCG/ACT inhaler Commonly known as: VENTOLIN HFA Inhale 2 puffs into the lungs every 6 (six) hours as needed for wheezing or shortness of breath.   aspirin EC 325 MG tablet Take 1 tablet (325 mg total) by mouth 2 (two) times daily.   atorvastatin 80 MG tablet Commonly known as: LIPITOR Take 80 mg by mouth daily with supper.   baclofen 10 MG tablet Commonly known as: LIORESAL Take 1 tablet (10 mg total) by mouth 3 (three) times daily. As needed for muscle spasm   CALCIUM 600  PO Take 600 mg by mouth 2 (two) times daily.   fenofibrate 160 MG tablet Take 160 mg by mouth daily.   Fiber-Lax 625 MG tablet Generic drug: polycarbophil Take 625 mg by mouth daily.   fluticasone 50 MCG/ACT nasal spray Commonly known as: FLONASE Place 2 sprays into both nostrils daily.   gabapentin 300 MG capsule Commonly known as: NEURONTIN Take 300 mg by mouth at bedtime.   HYDROcodone-acetaminophen 10-325 MG tablet Commonly known as: Norco Take 1 tablet by mouth every 6 (six) hours as needed.   Iron Supplement 325 (65 FE) MG tablet Generic drug: ferrous sulfate Take 325 mg by mouth daily.   magnesium oxide 400 MG tablet Commonly known as: MAG-OX Take 400 mg by mouth 2 (two) times daily.   metoprolol succinate 25 MG 24 hr tablet Commonly known as: TOPROL-XL Take 25 mg by mouth every evening.   multivitamin with minerals Tabs tablet Take 1 tablet by mouth daily.   ondansetron 4 MG tablet Commonly known as: Zofran Take 1 tablet (4 mg total) by mouth every 8 (eight) hours as needed for nausea or vomiting.   pantoprazole 40 MG tablet Commonly known as: PROTONIX Take 40 mg by mouth daily.   sennosides-docusate sodium 8.6-50 MG tablet Commonly known as: SENOKOT-S Take 2 tablets by mouth daily.   sertraline 50 MG tablet Commonly known as: ZOLOFT Take 50 mg by mouth at bedtime.       ALLERGIES: Allergies  Allergen Reactions  . Augmentin [Amoxicillin-Pot Clavulanate] Nausea Only    Did it involve swelling of the face/tongue/throat, SOB, or low BP? No Did it involve sudden or severe rash/hives, skin peeling, or any reaction on the inside of your mouth or nose? No Did you need to seek medical attention at a hospital or doctor's office? No When did it last happen? More than 10 years If all above answers are "NO", may proceed with cephalosporin use.   . Celebrex [Celecoxib] Nausea Only  . Oxycodone Other (See Comments)    Confusion, makes him "crazy"     FAMILY HISTORY: Family History  Problem Relation Age of Onset  . Breast cancer Mother   . Hypertension Mother   . Rheum arthritis Father   . Cancer Father   . Lung cancer Brother   . Stroke Paternal Grandfather     SOCIAL HISTORY:  reports that he has quit smoking. His smoking use included cigarettes. He has a 40.00 pack-year smoking history. He has never used smokeless tobacco. He reports that he does not drink alcohol and does not use drugs.  PHYSICAL EXAM: BP 138/72   Pulse 78   Ht 5\' 11"  (1.803 m)   Wt 235 lb (106.6 kg)   BMI 32.78 kg/m   Constitutional:  Alert and oriented, No acute distress. HEENT: [place]  AT, moist mucus membranes.  Trachea midline, no masses. Cardiovascular: No clubbing, cyanosis, or edema. Respiratory: Normal respiratory effort, no increased work of breathing. Lymph: No cervical or inguinal lymphadenopathy. Skin: No rashes, bruises or suspicious lesions. Neurologic: Grossly intact, no focal deficits, moving all 4 extremities. Psychiatric: Normal mood and affect.  LABORATORY DATA:  URINALYSIS: Dipstick/microscopy negative    ASSESSMENT/PLAN:   1. Hematuria  High risk hematuria with prior negative evaluation  No recurrent episodes  Follow-up for recurrent hematuria or prn   Abbie Sons, MD  [place] [address] [place], [place] [address] [phone]  By signing my name below, I, [employer], attest that this documentation has been prepared under the direction and in the presence of John Giovanni, MD. Electronically Signed: Abbie Sons, MD 09/19/19, 3:13 PM   I have reviewed the above documentation for accuracy and completeness, and I agree with the above.   Abbie Sons, MD

## 2019-10-04 DIAGNOSIS — N1832 Chronic kidney disease, stage 3b: Secondary | ICD-10-CM | POA: Diagnosis not present

## 2019-10-04 DIAGNOSIS — Z79899 Other long term (current) drug therapy: Secondary | ICD-10-CM | POA: Diagnosis not present

## 2019-10-04 DIAGNOSIS — Z9109 Other allergy status, other than to drugs and biological substances: Secondary | ICD-10-CM | POA: Diagnosis not present

## 2019-10-04 DIAGNOSIS — E7800 Pure hypercholesterolemia, unspecified: Secondary | ICD-10-CM | POA: Diagnosis not present

## 2019-10-04 DIAGNOSIS — I10 Essential (primary) hypertension: Secondary | ICD-10-CM | POA: Diagnosis not present

## 2019-10-04 DIAGNOSIS — R7309 Other abnormal glucose: Secondary | ICD-10-CM | POA: Diagnosis not present

## 2019-10-04 DIAGNOSIS — C8590 Non-Hodgkin lymphoma, unspecified, unspecified site: Secondary | ICD-10-CM | POA: Diagnosis not present

## 2019-10-12 DIAGNOSIS — Z96641 Presence of right artificial hip joint: Secondary | ICD-10-CM | POA: Diagnosis not present

## 2019-11-29 DIAGNOSIS — G4733 Obstructive sleep apnea (adult) (pediatric): Secondary | ICD-10-CM | POA: Diagnosis not present

## 2019-12-07 ENCOUNTER — Other Ambulatory Visit: Payer: Self-pay

## 2019-12-07 ENCOUNTER — Ambulatory Visit: Payer: PPO | Admitting: Dermatology

## 2019-12-07 DIAGNOSIS — D225 Melanocytic nevi of trunk: Secondary | ICD-10-CM | POA: Diagnosis not present

## 2019-12-07 DIAGNOSIS — D224 Melanocytic nevi of scalp and neck: Secondary | ICD-10-CM | POA: Diagnosis not present

## 2019-12-07 DIAGNOSIS — L570 Actinic keratosis: Secondary | ICD-10-CM

## 2019-12-07 DIAGNOSIS — D229 Melanocytic nevi, unspecified: Secondary | ICD-10-CM

## 2019-12-07 DIAGNOSIS — L578 Other skin changes due to chronic exposure to nonionizing radiation: Secondary | ICD-10-CM

## 2019-12-07 DIAGNOSIS — B079 Viral wart, unspecified: Secondary | ICD-10-CM | POA: Diagnosis not present

## 2019-12-07 DIAGNOSIS — L821 Other seborrheic keratosis: Secondary | ICD-10-CM | POA: Diagnosis not present

## 2019-12-07 DIAGNOSIS — L820 Inflamed seborrheic keratosis: Secondary | ICD-10-CM | POA: Diagnosis not present

## 2019-12-07 DIAGNOSIS — Z1283 Encounter for screening for malignant neoplasm of skin: Secondary | ICD-10-CM

## 2019-12-07 NOTE — Progress Notes (Signed)
   New Patient Visit  Subjective  Gavin Maxwell is a 74 y.o. male who presents for the following: Lesions on the scalp (patient's PCP was concerned and wanted them checked) and lesions (scattered on the trunk and extremities that he would like removed). The patient presents for Upper Body Skin Exam (UBSE) for skin cancer screening and mole check.  The following portions of the chart were reviewed this encounter and updated as appropriate:  Tobacco  Allergies  Meds  Problems  Med Hx  Surg Hx  Fam Hx     Review of Systems:  No other skin or systemic complaints except as noted in HPI or Assessment and Plan.  Objective  Well appearing patient in no apparent distress; mood and affect are within normal limits.  A focused examination was performed including the scalp, trunk, and extremities. Relevant physical exam findings are noted in the Assessment and Plan.  Objective  Scalp x 15; L crown (hypertrophic) (16): Erythematous thin papules/macules with gritty scale.   Objective  R lat neck, R flank: Tan-brown and/or pink-flesh-colored symmetric macules and papules.   Objective  R arm, trunk: Erythematous keratotic or waxy stuck-on papule or plaque.   Objective  L thumb, L middle finger (2): Verrucous papules -- Discussed viral etiology and contagion.   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) Scalp x 15; L crown (hypertrophic)  Destruction of lesion - Scalp x 15; L crown (hypertrophic) Complexity: simple   Destruction method: cryotherapy   Informed consent: discussed and consent obtained   Timeout:  patient name, date of birth, surgical site, and procedure verified Lesion destroyed using liquid nitrogen: Yes   Region frozen until ice ball extended beyond lesion: Yes   Outcome: patient tolerated procedure well with no complications   Post-procedure details: wound care instructions given    Nevus R lat neck, R flank Irritated nevus r/o dysplasia - plan biopsies at  follow up appointment   Inflamed seborrheic keratosis R arm, trunk Plan to treat at follow up appointment - some may need snip excision  Viral warts, unspecified type (2) L thumb, L middle finger  Destruction of lesion - L thumb, L middle finger Complexity: simple   Destruction method: cryotherapy   Informed consent: discussed and consent obtained   Timeout:  patient name, date of birth, surgical site, and procedure verified Lesion destroyed using liquid nitrogen: Yes   Region frozen until ice ball extended beyond lesion: Yes   Outcome: patient tolerated procedure well with no complications   Post-procedure details: wound care instructions given     Seborrheic Keratoses - Stuck-on, waxy, tan-brown papules and plaques  - Discussed benign etiology and prognosis. - Observe - Call for any changes  Actinic Damage - diffuse scaly erythematous macules with underlying dyspigmentation - Recommend daily broad spectrum sunscreen SPF 30+ to sun-exposed areas, reapply every 2 hours as needed.  - Call for new or changing lesions.  Return in about 2 months (around 02/06/2020) for AK recheck, biopsies, and to treat ISK's.  Luther Redo, CMA, am acting as scribe for Sarina Ser, MD .  Documentation: I have reviewed the above documentation for accuracy and completeness, and I agree with the above.  Sarina Ser, MD

## 2019-12-12 ENCOUNTER — Encounter: Payer: Self-pay | Admitting: Dermatology

## 2019-12-15 DIAGNOSIS — G4733 Obstructive sleep apnea (adult) (pediatric): Secondary | ICD-10-CM | POA: Diagnosis not present

## 2020-01-06 DIAGNOSIS — I10 Essential (primary) hypertension: Secondary | ICD-10-CM | POA: Diagnosis not present

## 2020-01-06 DIAGNOSIS — Z79899 Other long term (current) drug therapy: Secondary | ICD-10-CM | POA: Diagnosis not present

## 2020-01-06 DIAGNOSIS — E7800 Pure hypercholesterolemia, unspecified: Secondary | ICD-10-CM | POA: Diagnosis not present

## 2020-01-06 DIAGNOSIS — N1832 Chronic kidney disease, stage 3b: Secondary | ICD-10-CM | POA: Diagnosis not present

## 2020-01-06 DIAGNOSIS — D649 Anemia, unspecified: Secondary | ICD-10-CM | POA: Diagnosis not present

## 2020-02-08 ENCOUNTER — Encounter: Payer: Self-pay | Admitting: Dermatology

## 2020-02-08 ENCOUNTER — Other Ambulatory Visit: Payer: Self-pay

## 2020-02-08 ENCOUNTER — Ambulatory Visit: Payer: PPO | Admitting: Dermatology

## 2020-02-08 DIAGNOSIS — D492 Neoplasm of unspecified behavior of bone, soft tissue, and skin: Secondary | ICD-10-CM

## 2020-02-08 DIAGNOSIS — L570 Actinic keratosis: Secondary | ICD-10-CM | POA: Diagnosis not present

## 2020-02-08 DIAGNOSIS — D485 Neoplasm of uncertain behavior of skin: Secondary | ICD-10-CM

## 2020-02-08 DIAGNOSIS — L821 Other seborrheic keratosis: Secondary | ICD-10-CM | POA: Diagnosis not present

## 2020-02-08 DIAGNOSIS — B079 Viral wart, unspecified: Secondary | ICD-10-CM | POA: Diagnosis not present

## 2020-02-08 DIAGNOSIS — L578 Other skin changes due to chronic exposure to nonionizing radiation: Secondary | ICD-10-CM

## 2020-02-08 DIAGNOSIS — L820 Inflamed seborrheic keratosis: Secondary | ICD-10-CM

## 2020-02-08 NOTE — Patient Instructions (Signed)

## 2020-02-08 NOTE — Progress Notes (Signed)
Follow-Up Visit   Subjective  Gavin Maxwell is a 74 y.o. male who presents for the following: Actinic Keratosis (scalp f/u).  He has several other areas to be evaluated on his trunk and arms.  He has a wart on his finger he would like treated.  The following portions of the chart were reviewed this encounter and updated as appropriate:  Tobacco  Allergies  Meds  Problems  Med Hx  Surg Hx  Fam Hx     Review of Systems:  No other skin or systemic complaints except as noted in HPI or Assessment and Plan.  Objective  Well appearing patient in no apparent distress; mood and affect are within normal limits.  A focused examination was performed including face, scalp, trunk, arms. Relevant physical exam findings are noted in the Assessment and Plan.  Objective  Scalp/ears x 22 (22): Pink scaly macules   Objective  R chest infra pectoral: Fleshy pap  Objective  R lat neck: Fleshy pap  Objective  R medial elbow: 0.6cm cutaneous horn  Objective  R tricep: 1.1cm cutaneous horn  Objective  L cheek x 2, ant neck x 1 (3): Erythematous keratotic or waxy stuck-on papule or plaque.   Objective  L middle finger x 1: Verrucous papules -- Discussed viral etiology and contagion.    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  - Call for new or changing lesions -- Severe, chronic - Discussed "Field Treatment" for Severe, Confluent Actinic Changes with Pre-Cancerous Actinic Keratoses due to cumulative sun exposure/UV radiation exposure over time Field treatment involves treatment of an entire area of skin that has confluent Actinic Changes (Sun/ Ultraviolet light damage) and PreCancerous Actinic Keratoses by method of PhotoDynamic  Therapy (PDT) and/or prescription Topical Chemotherapy agents such as 5-fluorouracil, 5-fluorouracil/calcipotriene, and/or imiquimod.  The purpose is to decrease the number of clinically evident and subclinical PreCancerous lesions to prevent progression to development of skin cancer by chemically destroying early precancer changes that may or may not be visible.  It has been shown to reduce the risk of developing skin cancer in the treated area. As a result of treatment, redness, scaling, crusting, and open sores may occur during treatment course. One or more than one of these methods may be used and may have to be used several times to control, suppress and eliminate the PreCancerous changes. Discussed treatment course, expected reaction, and possible side effects.  Seborrheic Keratoses - Stuck-on, waxy, tan-brown papules and plaques  - Discussed benign etiology and prognosis. - Observe - Call for any changes  AK (actinic keratosis) (22) Scalp/ears x 22  Destruction of lesion - Scalp/ears x 22 Complexity: simple   Destruction method: cryotherapy   Informed consent: discussed and consent obtained   Timeout:  patient name, date of birth, surgical site, and procedure verified Lesion destroyed using liquid nitrogen: Yes   Region frozen until ice ball extended beyond lesion: Yes   Outcome: patient tolerated procedure well with no complications   Post-procedure details: wound care instructions given    Neoplasm of skin (4) R chest infra pectoral  R lat neck  R medial elbow  Epidermal / dermal shaving  Lesion diameter (cm):  0.6 Informed consent: discussed and consent obtained   Timeout: patient name, date of birth,  surgical site, and procedure verified   Procedure prep:  Patient was prepped and draped in usual sterile fashion Prep type:  Isopropyl alcohol Anesthesia: the lesion was anesthetized in a standard fashion   Anesthetic:  1% lidocaine w/ epinephrine 1-100,000 buffered w/ 8.4%  NaHCO3 Instrument used: flexible razor blade   Hemostasis achieved with: pressure, aluminum chloride and electrodesiccation   Outcome: patient tolerated procedure well   Post-procedure details: sterile dressing applied and wound care instructions given   Dressing type: bandage and petrolatum    Specimen 1 - Surgical pathology Differential Diagnosis: D48.5 Cutaneous horn r/o CA Check Margins: yes 0.6cm cutaneous horn  R tricep  Epidermal / dermal shaving  Lesion diameter (cm):  1.1 Informed consent: discussed and consent obtained   Timeout: patient name, date of birth, surgical site, and procedure verified   Procedure prep:  Patient was prepped and draped in usual sterile fashion Prep type:  Isopropyl alcohol Anesthesia: the lesion was anesthetized in a standard fashion   Anesthetic:  1% lidocaine w/ epinephrine 1-100,000 buffered w/ 8.4% NaHCO3 Instrument used: flexible razor blade   Hemostasis achieved with: pressure, aluminum chloride and electrodesiccation   Outcome: patient tolerated procedure well   Post-procedure details: sterile dressing applied and wound care instructions given   Dressing type: bandage and petrolatum    Specimen 2 - Surgical pathology Differential Diagnosis: D48.5 Cutaneous horn r/o CA Check Margins: yes 1.1cm cutaneous horn  Irritated Nevi r/o Dysplasia R chest infra pectoral and R lat neck, plan bx on f/u  Inflamed seborrheic keratosis (3) L cheek x 2, ant neck x 1  Destruction of lesion - L cheek x 2, ant neck x 1 Complexity: simple   Destruction method: cryotherapy   Informed consent: discussed and consent obtained   Timeout:  patient name, date of birth, surgical site, and procedure verified Lesion destroyed using liquid nitrogen: Yes   Region frozen until ice ball extended beyond lesion: Yes   Outcome: patient tolerated procedure well with no complications   Post-procedure details: wound care instructions given    Viral warts,  unspecified type L middle finger x 1 Destruction of lesion - L middle finger x 1 Complexity: simple   Destruction method: cryotherapy   Informed consent: discussed and consent obtained   Timeout:  patient name, date of birth, surgical site, and procedure verified Lesion destroyed using liquid nitrogen: Yes   Region frozen until ice ball extended beyond lesion: Yes   Outcome: patient tolerated procedure well with no complications   Post-procedure details: wound care instructions given    Return in about 3 months (around 05/10/2020) for AK f/u, bxs x 2.  I, Sonya Hupman, RMA, am acting as scribe for Sarina Ser, MD .  Documentation: I have reviewed the above documentation for accuracy and completeness, and I agree with the above.  Sarina Ser, MD

## 2020-02-09 ENCOUNTER — Telehealth: Payer: Self-pay

## 2020-02-09 NOTE — Telephone Encounter (Signed)
-----   Message from Ralene Bathe, MD sent at 02/09/2020  5:28 PM EDT ----- Diagnosis 1. Skin , right medial elbow VERRUCA VULGARIS, IRRITATED 2. Skin , right tricep VERRUCA VULGARIS, IRRITATED  1&2 - both viral warts May recur

## 2020-02-09 NOTE — Telephone Encounter (Signed)
Patient informed of pathology results 

## 2020-02-27 DIAGNOSIS — G4733 Obstructive sleep apnea (adult) (pediatric): Secondary | ICD-10-CM | POA: Diagnosis not present

## 2020-02-27 DIAGNOSIS — H903 Sensorineural hearing loss, bilateral: Secondary | ICD-10-CM | POA: Diagnosis not present

## 2020-03-09 DIAGNOSIS — G5601 Carpal tunnel syndrome, right upper limb: Secondary | ICD-10-CM | POA: Diagnosis not present

## 2020-04-09 DIAGNOSIS — G5601 Carpal tunnel syndrome, right upper limb: Secondary | ICD-10-CM | POA: Diagnosis not present

## 2020-04-19 ENCOUNTER — Other Ambulatory Visit: Payer: Self-pay

## 2020-04-19 DIAGNOSIS — R202 Paresthesia of skin: Secondary | ICD-10-CM

## 2020-04-20 ENCOUNTER — Other Ambulatory Visit: Payer: Self-pay

## 2020-04-20 DIAGNOSIS — R202 Paresthesia of skin: Secondary | ICD-10-CM

## 2020-05-04 DIAGNOSIS — E7800 Pure hypercholesterolemia, unspecified: Secondary | ICD-10-CM | POA: Diagnosis not present

## 2020-05-04 DIAGNOSIS — D649 Anemia, unspecified: Secondary | ICD-10-CM | POA: Diagnosis not present

## 2020-05-04 DIAGNOSIS — Z1211 Encounter for screening for malignant neoplasm of colon: Secondary | ICD-10-CM | POA: Diagnosis not present

## 2020-05-04 DIAGNOSIS — N1832 Chronic kidney disease, stage 3b: Secondary | ICD-10-CM | POA: Diagnosis not present

## 2020-05-04 DIAGNOSIS — I10 Essential (primary) hypertension: Secondary | ICD-10-CM | POA: Diagnosis not present

## 2020-05-04 DIAGNOSIS — R918 Other nonspecific abnormal finding of lung field: Secondary | ICD-10-CM | POA: Diagnosis not present

## 2020-05-04 DIAGNOSIS — Z79899 Other long term (current) drug therapy: Secondary | ICD-10-CM | POA: Diagnosis not present

## 2020-05-04 DIAGNOSIS — J90 Pleural effusion, not elsewhere classified: Secondary | ICD-10-CM | POA: Diagnosis not present

## 2020-05-04 DIAGNOSIS — R7309 Other abnormal glucose: Secondary | ICD-10-CM | POA: Diagnosis not present

## 2020-05-04 DIAGNOSIS — J9811 Atelectasis: Secondary | ICD-10-CM | POA: Diagnosis not present

## 2020-05-04 DIAGNOSIS — Z0000 Encounter for general adult medical examination without abnormal findings: Secondary | ICD-10-CM | POA: Diagnosis not present

## 2020-05-04 DIAGNOSIS — C8590 Non-Hodgkin lymphoma, unspecified, unspecified site: Secondary | ICD-10-CM | POA: Diagnosis not present

## 2020-05-04 DIAGNOSIS — Z125 Encounter for screening for malignant neoplasm of prostate: Secondary | ICD-10-CM | POA: Diagnosis not present

## 2020-05-18 ENCOUNTER — Other Ambulatory Visit (HOSPITAL_COMMUNITY): Payer: Self-pay | Admitting: Internal Medicine

## 2020-05-18 ENCOUNTER — Other Ambulatory Visit: Payer: Self-pay | Admitting: Internal Medicine

## 2020-05-18 DIAGNOSIS — J90 Pleural effusion, not elsewhere classified: Secondary | ICD-10-CM | POA: Diagnosis not present

## 2020-05-18 DIAGNOSIS — Z1211 Encounter for screening for malignant neoplasm of colon: Secondary | ICD-10-CM | POA: Diagnosis not present

## 2020-05-18 DIAGNOSIS — E7800 Pure hypercholesterolemia, unspecified: Secondary | ICD-10-CM | POA: Diagnosis not present

## 2020-05-18 DIAGNOSIS — Z79899 Other long term (current) drug therapy: Secondary | ICD-10-CM | POA: Diagnosis not present

## 2020-05-18 DIAGNOSIS — Z125 Encounter for screening for malignant neoplasm of prostate: Secondary | ICD-10-CM | POA: Diagnosis not present

## 2020-05-18 DIAGNOSIS — R9389 Abnormal findings on diagnostic imaging of other specified body structures: Secondary | ICD-10-CM | POA: Diagnosis not present

## 2020-05-18 DIAGNOSIS — R911 Solitary pulmonary nodule: Secondary | ICD-10-CM

## 2020-05-18 DIAGNOSIS — R7309 Other abnormal glucose: Secondary | ICD-10-CM | POA: Diagnosis not present

## 2020-05-18 DIAGNOSIS — I10 Essential (primary) hypertension: Secondary | ICD-10-CM | POA: Diagnosis not present

## 2020-05-21 ENCOUNTER — Other Ambulatory Visit: Payer: Self-pay

## 2020-05-21 ENCOUNTER — Encounter: Payer: Self-pay | Admitting: Dermatology

## 2020-05-21 ENCOUNTER — Ambulatory Visit: Payer: PPO | Admitting: Dermatology

## 2020-05-21 DIAGNOSIS — D492 Neoplasm of unspecified behavior of bone, soft tissue, and skin: Secondary | ICD-10-CM

## 2020-05-21 DIAGNOSIS — D225 Melanocytic nevi of trunk: Secondary | ICD-10-CM

## 2020-05-21 DIAGNOSIS — L820 Inflamed seborrheic keratosis: Secondary | ICD-10-CM | POA: Diagnosis not present

## 2020-05-21 DIAGNOSIS — L570 Actinic keratosis: Secondary | ICD-10-CM

## 2020-05-21 DIAGNOSIS — L578 Other skin changes due to chronic exposure to nonionizing radiation: Secondary | ICD-10-CM | POA: Diagnosis not present

## 2020-05-21 DIAGNOSIS — D485 Neoplasm of uncertain behavior of skin: Secondary | ICD-10-CM

## 2020-05-21 NOTE — Progress Notes (Signed)
Follow-Up Visit   Subjective  Gavin Maxwell is a 75 y.o. male who presents for the following: Actinic Keratosis (Check for new or persistent skin lesions on the scalp and face ) and irregular irritated nevi  (R chest infra pectoral, R lat neck - irritated nevus r/o dysplasia).  The following portions of the chart were reviewed this encounter and updated as appropriate:   Tobacco  Allergies  Meds  Problems  Med Hx  Surg Hx  Fam Hx     Review of Systems:  No other skin or systemic complaints except as noted in HPI or Assessment and Plan.  Objective  Well appearing patient in no apparent distress; mood and affect are within normal limits.  A focused examination was performed including the chest, neck, face, and scalp . Relevant physical exam findings are noted in the Assessment and Plan.  Objective  Scalp (17): Erythematous thin papules/macules with gritty scale.   Objective  R chest infra pectoral: Flesh colored papule 1.2 cm   Objective  R lat neck: Brown flat papule 1.1 cm   Assessment & Plan  AK (actinic keratosis) (17) Scalp  Destruction of lesion - Scalp Complexity: simple   Destruction method: cryotherapy   Informed consent: discussed and consent obtained   Timeout:  patient name, date of birth, surgical site, and procedure verified Lesion destroyed using liquid nitrogen: Yes   Region frozen until ice ball extended beyond lesion: Yes   Outcome: patient tolerated procedure well with no complications   Post-procedure details: wound care instructions given    Neoplasm of uncertain behavior of skin (2) R chest infra pectoral  Skin excision  Lesion length (cm):  1.2 Lesion width (cm):  1.2 Margin per side (cm):  0.2 Total excision diameter (cm):  1.6 Informed consent: discussed and consent obtained   Timeout: patient name, date of birth, surgical site, and procedure verified   Procedure prep:  Patient was prepped and draped in usual sterile  fashion Prep type:  Isopropyl alcohol and povidone-iodine Anesthesia: the lesion was anesthetized in a standard fashion   Anesthetic:  1% lidocaine w/ epinephrine 1-100,000 buffered w/ 8.4% NaHCO3 Hemostasis achieved with: pressure   Hemostasis achieved with comment:  Electrocautery Outcome: patient tolerated procedure well with no complications   Post-procedure details: sterile dressing applied and wound care instructions given   Dressing type: bandage and pressure dressing   Additional details:  Simple excision   Specimen 1 - Surgical pathology Differential Diagnosis: D48.5 nevus vs fibrolipoma r/o dysplasia Check Margins: No 1.2 cm flesh colored papule   R lat neck  Epidermal / dermal shaving  Lesion diameter (cm):  1.1 Informed consent: discussed and consent obtained   Timeout: patient name, date of birth, surgical site, and procedure verified   Procedure prep:  Patient was prepped and draped in usual sterile fashion Prep type:  Isopropyl alcohol Anesthesia: the lesion was anesthetized in a standard fashion   Anesthetic:  1% lidocaine w/ epinephrine 1-100,000 buffered w/ 8.4% NaHCO3 Instrument used: flexible razor blade   Hemostasis achieved with: pressure, aluminum chloride and electrodesiccation   Outcome: patient tolerated procedure well   Post-procedure details: sterile dressing applied and wound care instructions given   Dressing type: bandage and petrolatum    Specimen 2 - Surgical pathology Differential Diagnosis: D48.5 ISK r/o CA   Check Margins: No 1.1 cm Brown flat papule    Severe, 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  The purpose is to decrease the number of clinically evident and subclinical PreCancerous lesions to prevent progression to development of skin cancer by chemically destroying early precancer changes that may or may not be visible.  It has been shown to reduce the risk of developing skin cancer in the treated area. As a result of treatment, redness, scaling, crusting, and open sores may occur during treatment course. One or more than one of these methods may be used and may have to be used several times to control, suppress and eliminate the PreCancerous changes. Discussed treatment course, expected reaction, and possible side effects. - In one month come in for PDT of the scalp   Actinic Damage - chronic, secondary to cumulative UV radiation exposure/sun exposure over time - diffuse scaly erythematous macules with underlying dyspigmentation - Recommend daily broad spectrum sunscreen SPF 30+ to sun-exposed areas, reapply every 2 hours as needed.  - Call for new or changing lesions.  Return in about 1 month (around 06/18/2020) for nurse visit - PDT of the scalp, follow up with Dr. Raliegh Ip in 6 months.  Luther Redo, CMA, am acting as scribe for Sarina Ser, MD .  Documentation: I have reviewed the above documentation for accuracy and completeness, and I agree with the above.  Sarina Ser, MD

## 2020-05-21 NOTE — Patient Instructions (Signed)

## 2020-05-23 ENCOUNTER — Ambulatory Visit: Payer: PPO | Admitting: Neurology

## 2020-05-23 ENCOUNTER — Other Ambulatory Visit: Payer: Self-pay

## 2020-05-23 ENCOUNTER — Telehealth: Payer: Self-pay

## 2020-05-23 DIAGNOSIS — G5621 Lesion of ulnar nerve, right upper limb: Secondary | ICD-10-CM

## 2020-05-23 DIAGNOSIS — G5601 Carpal tunnel syndrome, right upper limb: Secondary | ICD-10-CM

## 2020-05-23 DIAGNOSIS — R202 Paresthesia of skin: Secondary | ICD-10-CM

## 2020-05-23 NOTE — Procedures (Signed)
[facility]  [place], [address]  [place], [place] [address] Tel: [phone] Fax:  [phone] Test Date:  05/23/2020  Patient: Gavin Maxwell DOB: [birth year] Physician: Narda Amber, DO  Sex: Male Height: 5\' 11"  Ref Phys: Marchia Bond, MD  ID#: [id number]   Technician:    Patient Complaints: This is a 75 year old man referred for evaluation of right hand pain and paresthesias.  NCV & EMG Findings: Extensive electrodiagnostic testing of the right upper extremity shows:  1. Right median sensory response is absent.  Right ulnar sensory response shows prolonged latency (3.6 ms) and borderline normal amplitude.  Right radial sensory responses within normal limits. 2. Right median motor response shows severely prolonged latency (13.1 ms) and reduced amplitude (1.9 mV).  Of note, there is evidence of a right Martin-Gruber anastomosis as seen by a motor response when stimulating at the ulnar-wrist.  Right ulnar motor response shows reduced amplitude (R6.3, R5.1 mV) and decreased conduction velocity (A Elbow-B Elbow, R45, R43 m/s).  3. Chronic motor axonal loss changes are seen affecting the right first dorsal interosseous, abductor pollicis brevis, and abductor digiti minimi muscles, without accompanying active denervation.   Impression: 1. Right median neuropathy at or distal to the wrist, consistent with a clinical diagnosis of carpal tunnel syndrome, very severe. 2. Right ulnar neuropathy with slowing across the elbow, with demyelinating and axonal features, moderate. 3. 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 4. There is no evidence of a cervical radiculopathy affecting the right upper extremity.   ___________________________ Narda Amber, DO    Nerve Conduction Studies Anti Sensory Summary Table   Stim Site NR Peak (ms) Norm Peak (ms) P-T Amp (V) Norm P-T Amp  Right Median Anti Sensory (2nd Digit)  33C  Wrist NR   <3.8  >10  Right Radial Anti Sensory (Base 1st Digit)  33C  Wrist    2.5 <2.8 12.6 >10  Right Ulnar Anti Sensory (5th Digit)  33C  Wrist    3.6 <3.2 5.4 >5   Motor Summary Table   Stim Site NR Onset (ms) Norm Onset (ms) O-P Amp (mV) Norm O-P Amp Site1 Site2 Delta-0 (ms) Dist (cm) Vel (m/s) Norm Vel (m/s)  Right Median Motor (Abd Poll Brev)  33C  Wrist    13.1 <4.0 1.9 >5 Elbow Wrist 0.5 31.0 620 >50  Elbow    13.6  1.7  Ulnar-wrist crossover Elbow 8.5 0.0    Ulnar-wrist crossover    5.1  2.0         Right Ulnar Motor (Abd Dig Minimi)  33C  Wrist    2.9 <3.1 6.3 >7 B Elbow Wrist 4.7 25.0 53 >50  B Elbow    7.6  5.0  A Elbow B Elbow 2.2 10.0 45 >50  A Elbow    9.8  5.0         Right Ulnar (FDI) Motor (1st DI)  33C  Wrist    4.2 <4.5 5.1 >7 B Elbow Wrist 4.5 25.0 56 >50  B Elbow    8.7  5.0  A Elbow B Elbow 2.3 10.0 43 >50  A Elbow    11.0  4.0          EMG   Side Muscle Ins Act Fibs Psw Fasc Number Recrt Dur Dur. Amp Amp. Poly Poly. Comment  Right 1stDorInt Nml Nml Nml Nml 2- Rapid Some 1+ Some 1+ Some 1+ N/A  Right Abd Poll Brev Nml  Nml Nml Nml 2- Rapid Some 1+ Some 1+ Some 1+ N/A  Right Ext Indicis Nml Nml Nml Nml Nml Nml Nml Nml Nml Nml Nml Nml N/A  Right PronatorTeres Nml Nml Nml Nml Nml Nml Nml Nml Nml Nml Nml Nml N/A  Right Biceps Nml Nml Nml Nml Nml Nml Nml Nml Nml Nml Nml Nml N/A  Right Triceps Nml Nml Nml Nml Nml Nml Nml Nml Nml Nml Nml Nml N/A  Right Deltoid Nml Nml Nml Nml Nml Nml Nml Nml Nml Nml Nml Nml N/A  Right ABD Dig Min Nml Nml Nml Nml 1- Rapid Some 1+ Some 1+ Nml Nml N/A  Right FlexCarpiUln Nml Nml Nml Nml Nml Nml Nml Nml Nml Nml Nml Nml N/A      Waveforms:

## 2020-05-23 NOTE — Telephone Encounter (Signed)
Advised patient of results/hd  

## 2020-05-23 NOTE — Telephone Encounter (Signed)
-----   Message from Ralene Bathe, MD sent at 05/22/2020  7:01 PM EST ----- Diagnosis 1. Skin , right chest infra pectoral MELANOCYTIC NEVUS, INTRADERMAL TYPE 2. Skin , right lat neck SEBORRHEIC KERATOSIS, IRRITATED  1- benign mole No further treatment needed 2- benign keratosis No further treatment needed

## 2020-06-01 ENCOUNTER — Ambulatory Visit
Admission: RE | Admit: 2020-06-01 | Discharge: 2020-06-01 | Disposition: A | Discharge status: Home / Self Care | Payer: PPO | Source: Ambulatory Visit | Attending: Internal Medicine | Admitting: Internal Medicine

## 2020-06-01 ENCOUNTER — Other Ambulatory Visit: Payer: Self-pay

## 2020-06-01 DIAGNOSIS — G5601 Carpal tunnel syndrome, right upper limb: Secondary | ICD-10-CM | POA: Diagnosis not present

## 2020-06-01 DIAGNOSIS — R911 Solitary pulmonary nodule: Secondary | ICD-10-CM | POA: Diagnosis not present

## 2020-06-01 DIAGNOSIS — M65341 Trigger finger, right ring finger: Secondary | ICD-10-CM | POA: Diagnosis not present

## 2020-06-01 DIAGNOSIS — J984 Other disorders of lung: Secondary | ICD-10-CM | POA: Diagnosis not present

## 2020-06-01 DIAGNOSIS — I2510 Atherosclerotic heart disease of native coronary artery without angina pectoris: Secondary | ICD-10-CM | POA: Diagnosis not present

## 2020-06-01 DIAGNOSIS — I700 Atherosclerosis of aorta: Secondary | ICD-10-CM | POA: Diagnosis not present

## 2020-06-06 DIAGNOSIS — Z1211 Encounter for screening for malignant neoplasm of colon: Secondary | ICD-10-CM | POA: Diagnosis not present

## 2020-06-12 DIAGNOSIS — G4733 Obstructive sleep apnea (adult) (pediatric): Secondary | ICD-10-CM | POA: Diagnosis not present

## 2020-06-18 DIAGNOSIS — N289 Disorder of kidney and ureter, unspecified: Secondary | ICD-10-CM | POA: Diagnosis not present

## 2020-06-21 DIAGNOSIS — M65341 Trigger finger, right ring finger: Secondary | ICD-10-CM | POA: Diagnosis not present

## 2020-06-21 DIAGNOSIS — G5601 Carpal tunnel syndrome, right upper limb: Secondary | ICD-10-CM | POA: Diagnosis not present

## 2020-06-25 ENCOUNTER — Ambulatory Visit: Payer: PPO

## 2020-06-25 ENCOUNTER — Other Ambulatory Visit: Payer: Self-pay

## 2020-06-25 DIAGNOSIS — L570 Actinic keratosis: Secondary | ICD-10-CM | POA: Diagnosis not present

## 2020-06-25 MED ORDER — AMINOLEVULINIC ACID HCL 20 % EX SOLR
1.0000 "application " | Freq: Once | CUTANEOUS | Status: AC
  Administered 2020-06-25: 354 mg via TOPICAL

## 2020-06-25 NOTE — Progress Notes (Signed)
Patient completed PDT therapy today.  1. AK (actinic keratosis) Scalp  Photodynamic therapy - Scalp Procedure discussed: discussed risks, benefits, side effects. and alternatives   Prep: site scrubbed/prepped with acetone   Location:  Scalp Number of lesions:  Multiple Type of treatment:  Blue light Aminolevulinic Acid (see MAR for details): Levulan Number of Levulan sticks used:  1 Incubation time (minutes):  120 Number of minutes under lamp:  16 Number of seconds under lamp:  40 Cooling:  Floor fan Outcome: patient tolerated procedure well with no complications   Post-procedure details: sunscreen applied     

## 2020-06-25 NOTE — Patient Instructions (Signed)

## 2020-07-04 DIAGNOSIS — G5601 Carpal tunnel syndrome, right upper limb: Secondary | ICD-10-CM | POA: Diagnosis not present

## 2020-07-04 DIAGNOSIS — M545 Low back pain, unspecified: Secondary | ICD-10-CM | POA: Diagnosis not present

## 2020-07-23 DIAGNOSIS — H401132 Primary open-angle glaucoma, bilateral, moderate stage: Secondary | ICD-10-CM | POA: Diagnosis not present

## 2020-07-23 DIAGNOSIS — N289 Disorder of kidney and ureter, unspecified: Secondary | ICD-10-CM | POA: Diagnosis not present

## 2020-08-03 DIAGNOSIS — M545 Low back pain, unspecified: Secondary | ICD-10-CM | POA: Diagnosis not present

## 2020-08-09 DIAGNOSIS — H401132 Primary open-angle glaucoma, bilateral, moderate stage: Secondary | ICD-10-CM | POA: Diagnosis not present

## 2020-08-16 DIAGNOSIS — M5416 Radiculopathy, lumbar region: Secondary | ICD-10-CM | POA: Diagnosis not present

## 2020-08-21 DIAGNOSIS — E782 Mixed hyperlipidemia: Secondary | ICD-10-CM | POA: Diagnosis not present

## 2020-08-21 DIAGNOSIS — N1832 Chronic kidney disease, stage 3b: Secondary | ICD-10-CM | POA: Diagnosis not present

## 2020-08-21 DIAGNOSIS — Z0000 Encounter for general adult medical examination without abnormal findings: Secondary | ICD-10-CM | POA: Diagnosis not present

## 2020-08-21 DIAGNOSIS — I10 Essential (primary) hypertension: Secondary | ICD-10-CM | POA: Diagnosis not present

## 2020-08-21 DIAGNOSIS — C8200 Follicular lymphoma grade I, unspecified site: Secondary | ICD-10-CM | POA: Diagnosis not present

## 2020-08-21 DIAGNOSIS — Z79899 Other long term (current) drug therapy: Secondary | ICD-10-CM | POA: Diagnosis not present

## 2020-08-21 DIAGNOSIS — R7309 Other abnormal glucose: Secondary | ICD-10-CM | POA: Diagnosis not present

## 2020-08-21 DIAGNOSIS — D649 Anemia, unspecified: Secondary | ICD-10-CM | POA: Diagnosis not present

## 2020-08-24 DIAGNOSIS — M5416 Radiculopathy, lumbar region: Secondary | ICD-10-CM | POA: Diagnosis not present

## 2020-08-27 DIAGNOSIS — H903 Sensorineural hearing loss, bilateral: Secondary | ICD-10-CM | POA: Diagnosis not present

## 2020-08-27 DIAGNOSIS — G4733 Obstructive sleep apnea (adult) (pediatric): Secondary | ICD-10-CM | POA: Diagnosis not present

## 2020-09-10 DIAGNOSIS — G4733 Obstructive sleep apnea (adult) (pediatric): Secondary | ICD-10-CM | POA: Diagnosis not present

## 2020-09-13 DIAGNOSIS — M5416 Radiculopathy, lumbar region: Secondary | ICD-10-CM | POA: Diagnosis not present

## 2020-09-13 DIAGNOSIS — M47816 Spondylosis without myelopathy or radiculopathy, lumbar region: Secondary | ICD-10-CM | POA: Diagnosis not present

## 2020-09-19 DIAGNOSIS — M545 Low back pain, unspecified: Secondary | ICD-10-CM | POA: Diagnosis not present

## 2020-09-24 DIAGNOSIS — N289 Disorder of kidney and ureter, unspecified: Secondary | ICD-10-CM | POA: Diagnosis not present

## 2020-09-27 DIAGNOSIS — M48061 Spinal stenosis, lumbar region without neurogenic claudication: Secondary | ICD-10-CM | POA: Diagnosis not present

## 2020-09-27 DIAGNOSIS — M545 Low back pain, unspecified: Secondary | ICD-10-CM | POA: Diagnosis not present

## 2020-09-27 DIAGNOSIS — M5416 Radiculopathy, lumbar region: Secondary | ICD-10-CM | POA: Diagnosis not present

## 2020-10-05 DIAGNOSIS — M48061 Spinal stenosis, lumbar region without neurogenic claudication: Secondary | ICD-10-CM | POA: Diagnosis not present

## 2020-11-15 DIAGNOSIS — M48061 Spinal stenosis, lumbar region without neurogenic claudication: Secondary | ICD-10-CM | POA: Diagnosis not present

## 2020-11-15 DIAGNOSIS — M5416 Radiculopathy, lumbar region: Secondary | ICD-10-CM | POA: Diagnosis not present

## 2020-11-22 ENCOUNTER — Ambulatory Visit: Payer: PPO | Admitting: Dermatology

## 2020-11-22 ENCOUNTER — Other Ambulatory Visit: Payer: Self-pay

## 2020-11-22 DIAGNOSIS — L578 Other skin changes due to chronic exposure to nonionizing radiation: Secondary | ICD-10-CM

## 2020-11-22 DIAGNOSIS — L570 Actinic keratosis: Secondary | ICD-10-CM | POA: Diagnosis not present

## 2020-11-22 NOTE — Patient Instructions (Signed)

## 2020-11-22 NOTE — Progress Notes (Signed)
   Follow-Up Visit   Subjective  Gavin Maxwell is a 75 y.o. male who presents for the following: Actinic Keratosis (6 months f/u hx of Aks on the scalp).  The following portions of the chart were reviewed this encounter and updated as appropriate:   Tobacco  Allergies  Meds  Problems  Med Hx  Surg Hx  Fam Hx     Review of Systems:  No other skin or systemic complaints except as noted in HPI or Assessment and Plan.  Objective  Well appearing patient in no apparent distress; mood and affect are within normal limits.  A focused examination was performed including face,scalp. Relevant physical exam findings are noted in the Assessment and Plan.  face, scalp x 18 (18) Erythematous thin papules/macules with gritty scale.    Assessment & Plan  AK (actinic keratosis) (18) face, scalp x 18  Destruction of lesion - face, scalp x 18 Complexity: simple   Destruction method: cryotherapy   Informed consent: discussed and consent obtained   Timeout:  patient name, date of birth, surgical site, and procedure verified Lesion destroyed using liquid nitrogen: Yes   Region frozen until ice ball extended beyond lesion: Yes   Outcome: patient tolerated procedure well with no complications   Post-procedure details: wound care instructions given    Actinic Damage - Severe, confluent actinic changes with pre-cancerous actinic keratoses  - Severe, chronic, not at goal, secondary to cumulative UV radiation exposure over time - diffuse scaly erythematous macules and papules with underlying dyspigmentation - Discussed Prescription "Field Treatment" for Severe, Chronic Confluent Actinic Changes with Pre-Cancerous Actinic Keratoses  PDT on the Scalp and Forehead in September  Field treatment involves treatment of an entire area of skin that has confluent Actinic Changes (Sun/ Ultraviolet light damage) and PreCancerous Actinic Keratoses by method of PhotoDynamic Therapy (PDT) and/or prescription  Topical Chemotherapy agents such as 5-fluorouracil, 5-fluorouracil/calcipotriene, and/or imiquimod.  The purpose is to decrease the number of clinically evident and subclinical PreCancerous lesions to prevent progression to development of skin cancer by chemically destroying early precancer changes that may or may not be visible.  It has been shown to reduce the risk of developing skin cancer in the treated area. As a result of treatment, redness, scaling, crusting, and open sores may occur during treatment course. One or more than one of these methods may be used and may have to be used several times to control, suppress and eliminate the PreCancerous changes. Discussed treatment course, expected reaction, and possible side effects. - Recommend daily broad spectrum sunscreen SPF 30+ to sun-exposed areas, reapply every 2 hours as needed.  - Staying in the shade or wearing long sleeves, sun glasses (UVA+UVB protection) and wide brim hats (4-inch brim around the entire circumference of the hat) are also recommended. - Call for new or changing lesions.   Return in about 6 months (around 05/25/2021) for Aks, PDT in 1 month.  IMarye Round, CMA, am acting as scribe for Sarina Ser, MD .  Documentation: I have reviewed the above documentation for accuracy and completeness, and I agree with the above.  Sarina Ser, MD

## 2020-11-26 ENCOUNTER — Encounter: Payer: Self-pay | Admitting: Dermatology

## 2020-11-27 DIAGNOSIS — M48062 Spinal stenosis, lumbar region with neurogenic claudication: Secondary | ICD-10-CM | POA: Diagnosis not present

## 2020-11-30 DIAGNOSIS — M545 Low back pain, unspecified: Secondary | ICD-10-CM | POA: Diagnosis not present

## 2020-11-30 DIAGNOSIS — M48061 Spinal stenosis, lumbar region without neurogenic claudication: Secondary | ICD-10-CM | POA: Diagnosis not present

## 2020-12-07 DIAGNOSIS — H401132 Primary open-angle glaucoma, bilateral, moderate stage: Secondary | ICD-10-CM | POA: Diagnosis not present

## 2020-12-12 ENCOUNTER — Ambulatory Visit (INDEPENDENT_AMBULATORY_CARE_PROVIDER_SITE_OTHER): Payer: PPO

## 2020-12-12 ENCOUNTER — Other Ambulatory Visit: Payer: Self-pay

## 2020-12-12 DIAGNOSIS — L570 Actinic keratosis: Secondary | ICD-10-CM | POA: Diagnosis not present

## 2020-12-12 MED ORDER — AMINOLEVULINIC ACID HCL 20 % EX SOLR
1.0000 "application " | Freq: Once | CUTANEOUS | Status: AC
  Administered 2020-12-12: 354 mg via TOPICAL

## 2020-12-12 NOTE — Patient Instructions (Signed)

## 2020-12-12 NOTE — Progress Notes (Signed)
1. AK (actinic keratosis) Scalp and forehead  Photodynamic therapy - Scalp and forehead Procedure discussed: discussed risks, benefits, side effects. and alternatives   Prep: site scrubbed/prepped with acetone   Location:  Scalp and forehead Number of lesions:  Multiple Type of treatment:  Blue light Aminolevulinic Acid (see MAR for details): Levulan Number of Levulan sticks used:  1 Incubation time (minutes):  120 Number of minutes under lamp:  16 Number of seconds under lamp:  40 Cooling:  Floor fan Outcome: patient tolerated procedure well with no complications   Post-procedure details: sunscreen applied and aftercare instructions given to patient    Aminolevulinic Acid HCl 20 % SOLR 354 mg - Scalp and forehead

## 2021-01-08 DIAGNOSIS — N183 Chronic kidney disease, stage 3 unspecified: Secondary | ICD-10-CM | POA: Diagnosis not present

## 2021-01-08 DIAGNOSIS — C8200 Follicular lymphoma grade I, unspecified site: Secondary | ICD-10-CM | POA: Diagnosis not present

## 2021-01-08 DIAGNOSIS — Z79899 Other long term (current) drug therapy: Secondary | ICD-10-CM | POA: Diagnosis not present

## 2021-01-08 DIAGNOSIS — I10 Essential (primary) hypertension: Secondary | ICD-10-CM | POA: Diagnosis not present

## 2021-01-08 DIAGNOSIS — R7309 Other abnormal glucose: Secondary | ICD-10-CM | POA: Diagnosis not present

## 2021-01-08 DIAGNOSIS — E7800 Pure hypercholesterolemia, unspecified: Secondary | ICD-10-CM | POA: Diagnosis not present

## 2021-01-15 DIAGNOSIS — M5416 Radiculopathy, lumbar region: Secondary | ICD-10-CM | POA: Diagnosis not present

## 2021-01-15 DIAGNOSIS — M48061 Spinal stenosis, lumbar region without neurogenic claudication: Secondary | ICD-10-CM | POA: Diagnosis not present

## 2021-01-15 DIAGNOSIS — M545 Low back pain, unspecified: Secondary | ICD-10-CM | POA: Diagnosis not present

## 2021-01-18 DIAGNOSIS — M48061 Spinal stenosis, lumbar region without neurogenic claudication: Secondary | ICD-10-CM | POA: Diagnosis not present

## 2021-01-18 DIAGNOSIS — M5416 Radiculopathy, lumbar region: Secondary | ICD-10-CM | POA: Diagnosis not present

## 2021-02-11 DIAGNOSIS — M5416 Radiculopathy, lumbar region: Secondary | ICD-10-CM | POA: Diagnosis not present

## 2021-02-11 DIAGNOSIS — M48061 Spinal stenosis, lumbar region without neurogenic claudication: Secondary | ICD-10-CM | POA: Diagnosis not present

## 2021-02-14 DIAGNOSIS — G4733 Obstructive sleep apnea (adult) (pediatric): Secondary | ICD-10-CM | POA: Diagnosis not present

## 2021-02-25 DIAGNOSIS — H6123 Impacted cerumen, bilateral: Secondary | ICD-10-CM | POA: Diagnosis not present

## 2021-02-25 DIAGNOSIS — G4733 Obstructive sleep apnea (adult) (pediatric): Secondary | ICD-10-CM | POA: Diagnosis not present

## 2021-03-04 DIAGNOSIS — M48061 Spinal stenosis, lumbar region without neurogenic claudication: Secondary | ICD-10-CM | POA: Diagnosis not present

## 2021-03-15 DIAGNOSIS — M25551 Pain in right hip: Secondary | ICD-10-CM | POA: Diagnosis not present

## 2021-03-15 DIAGNOSIS — M48061 Spinal stenosis, lumbar region without neurogenic claudication: Secondary | ICD-10-CM | POA: Diagnosis not present

## 2021-03-18 DIAGNOSIS — M48061 Spinal stenosis, lumbar region without neurogenic claudication: Secondary | ICD-10-CM | POA: Diagnosis not present

## 2021-03-18 DIAGNOSIS — M47816 Spondylosis without myelopathy or radiculopathy, lumbar region: Secondary | ICD-10-CM | POA: Diagnosis not present

## 2021-03-22 DIAGNOSIS — M48061 Spinal stenosis, lumbar region without neurogenic claudication: Secondary | ICD-10-CM | POA: Diagnosis not present

## 2021-03-22 DIAGNOSIS — M47816 Spondylosis without myelopathy or radiculopathy, lumbar region: Secondary | ICD-10-CM | POA: Diagnosis not present

## 2021-04-04 DIAGNOSIS — M48061 Spinal stenosis, lumbar region without neurogenic claudication: Secondary | ICD-10-CM | POA: Diagnosis not present

## 2021-04-04 DIAGNOSIS — M47816 Spondylosis without myelopathy or radiculopathy, lumbar region: Secondary | ICD-10-CM | POA: Diagnosis not present

## 2021-04-11 DIAGNOSIS — H401132 Primary open-angle glaucoma, bilateral, moderate stage: Secondary | ICD-10-CM | POA: Diagnosis not present

## 2021-04-19 DIAGNOSIS — M545 Low back pain, unspecified: Secondary | ICD-10-CM | POA: Diagnosis not present

## 2021-04-23 DIAGNOSIS — M545 Low back pain, unspecified: Secondary | ICD-10-CM | POA: Diagnosis not present

## 2021-04-30 DIAGNOSIS — M5416 Radiculopathy, lumbar region: Secondary | ICD-10-CM | POA: Diagnosis not present

## 2021-05-08 ENCOUNTER — Other Ambulatory Visit: Payer: Self-pay | Admitting: Orthopedic Surgery

## 2021-05-12 NOTE — Progress Notes (Signed)
Cardiology Office Note  Date:  05/13/2021   ID:  Gavin Maxwell 1945-11-04, MRN [id number]  PCP:  Idelle Crouch, MD   Chief Complaint  Patient presents with   Cardiac clearance     Back surgery scheduled for June 05, 2021 with [facility]. "Doing well." Medications reviewed by the patient verbally.     HPI:  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 Last seen by EP January 2021  No further episodes of tachycardia  Lower back pain L1 and l2 Can't walk well, bad in Am, uses a cane  Denies any chest pain concerning for angina Denies shortness of breath  CR 1.9,  BUN 35  EKG personally reviewed by myself on todays visit Normal sinus rhythm rate 69 bpm no significant ST-T wave changes  Other past medical history reviewed hospital May 2019 acute shortness of breath primary spontaneous pneumothorax on the right Underwent chest tube insertion, right thoracoscopy and apical blebectomy and talc pleurodesis  Noted to have intermittent episodes of SVT treated with beta-blockers Followed by pulmonary  Prior CTs personally reviewed by myself  Rare episodes of near syncope, typically when sitting down One day sitting at computer, "almost went out" on 03/2017 Seen by [facility]:  Echo:  NORMAL LEFT VENTRICULAR SYSTOLIC FUNCTION   WITH MILD LVH NORMAL RIGHT VENTRICULAR SYSTOLIC FUNCTION MILD VALVULAR REGURGITATION (See above) NO VALVULAR STENOSIS MILD TR, PR TRIVIAL MR EF 50%  Carotid u/s 04/09/2017 which revealed minimal atherosclerotic plaque bilaterally without hemodynamically significant stenosis.  Seen in [place], back doctor "abn heart  rhythm, irregular, did not feel well":  Prior event monitor 24 hours done through [facility] showing rare short episodes atrial tachycardia.  Did not have a " spell" when he had the monitor in place   PMH:   has a past medical history of Anxiety, Cancer ([place]), Depression, Dysrhythmia, GERD (gastroesophageal reflux disease), History of hiatal hernia, Hypertension, Pneumonia, Primary localized osteoarthritis of knee (09/26/2014), Primary localized osteoarthrosis, shoulder region, rotator cuff arthropathy (10/04/2013), and Sleep apnea.  PSH:    Past Surgical History:  Procedure Laterality Date   CARDIAC CATHETERIZATION     20 yrs. ago   CHOLECYSTECTOMY     EYE SURGERY     JOINT REPLACEMENT     hip   NASAL SINUS SURGERY     x2   REVERSE SHOULDER ARTHROPLASTY Right 10/04/2013   Procedure: REVERSE SHOULDER ARTHROPLASTY;  Surgeon: Johnny Bridge, MD;  Location: [facility];  Service: Orthopedics;  Laterality: Right;   SHOULDER ACROMIOPLASTY     x 5 shoulder surgeries   SVT ABLATION N/A 03/07/2019   Procedure: SVT ABLATION;  Surgeon: Constance Haw, MD;  Location: [facility];  Service: Cardiovascular;  Laterality: N/A;   TOTAL HIP ARTHROPLASTY Right 07/26/2019   Procedure: TOTAL HIP ARTHROPLASTY;  Surgeon: Marchia Bond, MD;  Location: [facility];  Service: Orthopedics;  Laterality: Right;   TOTAL KNEE ARTHROPLASTY Right 09/26/2014   TOTAL KNEE ARTHROPLASTY Right 09/26/2014   Procedure: RIGHT TOTAL KNEE ARTHROPLASTY;  Surgeon: Marchia Bond, MD;  Location: [facility];  Service: Orthopedics;  Laterality: Right;   VIDEO ASSISTED THORACOSCOPY (VATS) W/TALC PLEUADESIS Right 08/18/2017   Procedure: VIDEO ASSISTED THORACOSCOPY (VATS)POSSIBLE  W/TALC PLEUADESIS.POSSIBLE BLEBECTOMY;  Surgeon: Nestor Lewandowsky, MD;  Location: [facility];  Service: Thoracic;  Laterality: Right;    Current Outpatient Medications  Medication Sig Dispense Refill   albuterol (PROVENTIL HFA;VENTOLIN HFA) 108 (90 Base) MCG/ACT inhaler  Inhale 2 puffs into the lungs every 6 (six) hours as needed for wheezing or shortness of breath. 1 Inhaler 3   aspirin EC 81 MG tablet Take 1 tablet (81 mg total) by mouth daily. Swallow whole. 90 tablet 3   Calcium Carbonate (CALCIUM 600 PO) Take 600 mg by mouth 2 (two) times daily.      fenofibrate 160 MG tablet Take 160 mg by mouth daily.      ferrous sulfate 325 (65 FE) MG tablet Take 325 mg by mouth daily.     fluticasone (FLONASE) 50 MCG/ACT nasal spray Place 2 sprays into both nostrils daily.      fluticasone-salmeterol (ADVAIR HFA) 115-21 MCG/ACT inhaler Inhale 2 puffs into the lungs 2 (two) times daily.     gabapentin (NEURONTIN) 300 MG capsule Take 900 mg by mouth at bedtime.     magnesium oxide (MAG-OX) 400 MG tablet Take 400 mg by mouth 2 (two) times daily.      Multiple Vitamin (MULTIVITAMIN WITH MINERALS) TABS tablet Take 1 tablet by mouth daily.     pantoprazole (PROTONIX) 40 MG tablet Take 40 mg by mouth daily.      polycarbophil (FIBERCON) 625 MG tablet Take 625 mg by mouth daily.     predniSONE (STERAPRED UNI-PAK 21 TAB) 10 MG (21) TBPK tablet See admin instructions.     sertraline (ZOLOFT) 50 MG tablet Take 50 mg by mouth at bedtime.     atorvastatin (LIPITOR) 80 MG tablet Take 1 tablet (80 mg total) by mouth daily. 90 tablet 3   metoprolol succinate (TOPROL-XL) 25 MG 24 hr tablet Take 1 tablet (25 mg total) by mouth every evening. 90 tablet 3   No current facility-administered medications for this visit.     Allergies:   Augmentin [amoxicillin-pot clavulanate], Celebrex [celecoxib], and Oxycodone   Social History:  The patient  reports that he has quit smoking. His smoking use included cigarettes. He has a 40.00 pack-year smoking history. He has never used smokeless tobacco. He reports that he does not drink alcohol and does not use drugs.   Family History:   family history includes Breast cancer in his mother; Cancer in his father; Hypertension in his mother; Lung cancer  in his brother; Rheum arthritis in his father; Stroke in his paternal grandfather.    Review of Systems: Review of Systems  Constitutional: Negative.   HENT: Negative.    Respiratory: Negative.    Cardiovascular: Negative.   Gastrointestinal: Negative.   Musculoskeletal: Negative.   Neurological:  Positive for dizziness.       Near syncope episodes  Psychiatric/Behavioral: Negative.    All other systems reviewed and are negative.   PHYSICAL EXAM: VS:  BP 138/68 (BP Location: Left Arm, Patient Position: Sitting, Cuff Size: Normal)    Pulse 69    Ht 5\' 11"  (1.803 m)    Wt 237 lb (107.5 kg)    SpO2 96%    BMI 33.05 kg/m  , BMI Body mass index is 33.05 kg/m. Constitutional:  oriented to person, place, and time. No distress.  HENT:  Head: Grossly normal Eyes:  no discharge. No scleral icterus.  Neck: No JVD, no carotid bruits  Cardiovascular: Regular rate and rhythm, no murmurs appreciated Pulmonary/Chest: Clear  to auscultation bilaterally, no wheezes or rails Abdominal: Soft.  no distension.  no tenderness.  Musculoskeletal: Normal range of motion Neurological:  normal muscle tone. Coordination normal. No atrophy Skin: Skin warm and dry Psychiatric: normal affect, pleasant   Recent Labs: No results found for requested labs within last 8760 hours.    Lipid Panel No results found for: CHOL, HDL, LDLCALC, TRIG    Wt Readings from Last 3 Encounters:  05/13/21 237 lb (107.5 kg)  09/19/19 235 lb (106.6 kg)  07/26/19 238 lb 5.1 oz (108.1 kg)     ASSESSMENT AND PLAN:  Problem List Items Addressed This Visit       Cardiology Problems   SVT (supraventricular tachycardia) (HCC) - Primary   Relevant Medications   aspirin EC 81 MG tablet   atorvastatin (LIPITOR) 80 MG tablet   metoprolol succinate (TOPROL-XL) 25 MG 24 hr tablet   Other Relevant Orders   EKG 12-Lead   Essential hypertension   Relevant Medications   aspirin EC 81 MG tablet   atorvastatin (LIPITOR) 80 MG  tablet   metoprolol succinate (TOPROL-XL) 25 MG 24 hr tablet   Other Relevant Orders   EKG 12-Lead   Hyperlipidemia   Relevant Medications   aspirin EC 81 MG tablet   atorvastatin (LIPITOR) 80 MG tablet   metoprolol succinate (TOPROL-XL) 25 MG 24 hr tablet   SVT Prior ablation, no recurrent episodes  Essential hypertension Blood pressure is well controlled on today's visit. No changes made to the medications.  Hyperlipidemia Cholesterol is at goal on the current lipid regimen. No changes to the medications were made.  Preop cardiovascular evaluation Acceptable risk for back surgery in March 2023, no further testing needed We will need to stop aspirin 7 days prior to surgery  Chronic kidney disease Recommend he avoid NSAIDs   Total encounter time more than 25 minutes  Greater than 50% was spent in counseling and coordination of care with the patient    Signed, Esmond Plants, M.D., Ph.D. [place], [place]

## 2021-05-13 ENCOUNTER — Encounter: Payer: Self-pay | Admitting: Cardiovascular Disease

## 2021-05-13 ENCOUNTER — Ambulatory Visit: Payer: PPO | Admitting: Cardiovascular Disease

## 2021-05-13 ENCOUNTER — Other Ambulatory Visit: Payer: Self-pay

## 2021-05-13 VITALS — BP 138/68 | HR 69 | Ht 71.0 in | Wt 237.0 lb

## 2021-05-13 DIAGNOSIS — N1832 Chronic kidney disease, stage 3b: Secondary | ICD-10-CM

## 2021-05-13 DIAGNOSIS — I10 Essential (primary) hypertension: Secondary | ICD-10-CM

## 2021-05-13 DIAGNOSIS — I471 Supraventricular tachycardia: Secondary | ICD-10-CM

## 2021-05-13 DIAGNOSIS — E782 Mixed hyperlipidemia: Secondary | ICD-10-CM

## 2021-05-13 MED ORDER — ATORVASTATIN CALCIUM 80 MG PO TABS: 80.0000 mg | ORAL_TABLET | Freq: Every day | ORAL | 3 refills | Status: DC

## 2021-05-13 MED ORDER — ASPIRIN EC 81 MG PO TBEC: 81.0000 mg | DELAYED_RELEASE_TABLET | Freq: Every day | ORAL | 3 refills | Status: DC

## 2021-05-13 MED ORDER — METOPROLOL SUCCINATE ER 25 MG PO TB24: 25.0000 mg | ORAL_TABLET | Freq: Every evening | ORAL | 3 refills | Status: DC

## 2021-05-13 NOTE — Patient Instructions (Signed)
Medication Instructions:  No changes  If you need a refill on your cardiac medications before your next appointment, please call your pharmacy.   Lab work: No new labs needed  Testing/Procedures: No new testing needed  Follow-Up: At [facility], you and your health needs are our priority.  As part of our continuing mission to provide you with exceptional heart care, we have created designated Provider Care Teams.  These Care Teams include your primary Cardiologist (physician) and Advanced Practice Providers (APPs -  Physician Assistants and Nurse Practitioners) who all work together to provide you with the care you need, when you need it.  You will need a follow up appointment in 12 months  Providers on your designated Care Team:   Christopher Berge, NP Ryan Dunn, PA-C Cadence Furth, PA-C  COVID-19 Vaccine Information can be found at: [email or web address] For questions related to vaccine distribution or appointments, please email [email or web address] or call [phone].   

## 2021-05-16 DIAGNOSIS — Z0000 Encounter for general adult medical examination without abnormal findings: Secondary | ICD-10-CM | POA: Diagnosis not present

## 2021-05-16 DIAGNOSIS — C8200 Follicular lymphoma grade I, unspecified site: Secondary | ICD-10-CM | POA: Diagnosis not present

## 2021-05-16 DIAGNOSIS — Z79899 Other long term (current) drug therapy: Secondary | ICD-10-CM | POA: Diagnosis not present

## 2021-05-16 DIAGNOSIS — I471 Supraventricular tachycardia: Secondary | ICD-10-CM | POA: Diagnosis not present

## 2021-05-16 DIAGNOSIS — R7309 Other abnormal glucose: Secondary | ICD-10-CM | POA: Diagnosis not present

## 2021-05-16 DIAGNOSIS — N183 Chronic kidney disease, stage 3 unspecified: Secondary | ICD-10-CM | POA: Diagnosis not present

## 2021-05-16 DIAGNOSIS — J449 Chronic obstructive pulmonary disease, unspecified: Secondary | ICD-10-CM | POA: Diagnosis not present

## 2021-05-16 DIAGNOSIS — E782 Mixed hyperlipidemia: Secondary | ICD-10-CM | POA: Diagnosis not present

## 2021-05-16 DIAGNOSIS — Z125 Encounter for screening for malignant neoplasm of prostate: Secondary | ICD-10-CM | POA: Diagnosis not present

## 2021-05-16 DIAGNOSIS — I10 Essential (primary) hypertension: Secondary | ICD-10-CM | POA: Diagnosis not present

## 2021-05-16 DIAGNOSIS — D649 Anemia, unspecified: Secondary | ICD-10-CM | POA: Diagnosis not present

## 2021-05-20 ENCOUNTER — Other Ambulatory Visit: Payer: Self-pay

## 2021-05-20 ENCOUNTER — Encounter: Payer: Self-pay | Admitting: Dermatology

## 2021-05-20 ENCOUNTER — Ambulatory Visit: Payer: PPO | Admitting: Dermatology

## 2021-05-20 DIAGNOSIS — L820 Inflamed seborrheic keratosis: Secondary | ICD-10-CM

## 2021-05-20 DIAGNOSIS — L570 Actinic keratosis: Secondary | ICD-10-CM

## 2021-05-20 DIAGNOSIS — L578 Other skin changes due to chronic exposure to nonionizing radiation: Secondary | ICD-10-CM

## 2021-05-20 NOTE — Patient Instructions (Addendum)
Cryotherapy Aftercare  Wash gently with soap and water everyday.   Apply Vaseline and Band-Aid daily until healed.   Prior to procedure, discussed risks of blister formation, small wound, skin dyspigmentation, or rare scar following cryotherapy. Recommend Vaseline ointment to treated areas while healing.   Recommend daily broad spectrum sunscreen SPF 30+ to sun-exposed areas, reapply every 2 hours as needed. Call for new or changing lesions.  Staying in the shade or wearing long sleeves, sun glasses (UVA+UVB protection) and wide brim hats (4-inch brim around the entire circumference of the hat) are also recommended for sun protection.   If You Need Anything After Your Visit  If you have any questions or concerns for your doctor, please call our main line at [phone] and press option 4 to reach your doctor's medical assistant. If no one answers, please leave a voicemail as directed and we will return your call as soon as possible. Messages left after 4 pm will be answered the following business day.   You may also send [place] a message via [place]. We typically respond to MyChart messages within 1-2 business days.  For prescription refills, please ask your pharmacy to contact our office. Our fax number is [phone].  If you have an urgent issue when the clinic is closed that cannot wait until the next business day, you can page your doctor at the number below.    Please note that while we do our best to be available for urgent issues outside of office hours, we are not available 24/7.   If you have an urgent issue and are unable to reach [place], you may choose to seek medical care at your doctor's office, retail clinic, urgent care center, or emergency room.  If you have a medical emergency, please immediately call 911 or go to the emergency department.  Pager Numbers  - Dr. Nehemiah Massed: [phone]  - Dr. Laurence Ferrari: [phone]  - Dr. Nicole Kindred: [phone]  In the event of inclement  weather, please call our main line at [phone] for an update on the status of any delays or closures.  Dermatology Medication Tips: Please keep the boxes that topical medications come in in order to help keep track of the instructions about where and how to use these. Pharmacies typically print the medication instructions only on the boxes and not directly on the medication tubes.   If your medication is too expensive, please contact our office at [phone] option 4 or send [place] a message through [place].   We are unable to tell what your co-pay for medications will be in advance as this is different depending on your insurance coverage. However, we may be able to find a substitute medication at lower cost or fill out paperwork to get insurance to cover a needed medication.   If a prior authorization is required to get your medication covered by your insurance company, please allow [place] 1-2 business days to complete this process.  Drug prices often vary depending on where the prescription is filled and some pharmacies may offer cheaper prices.  The website www.goodrx.com contains coupons for medications through different pharmacies. The prices here do not account for what the cost may be with help from insurance (it may be cheaper with your insurance), but the website can give you the price if you did not use any insurance.  - You can print the associated coupon and take it with your prescription to the pharmacy.  - You may also stop by our office during regular  business hours and pick up a GoodRx coupon card.  - If you need your prescription sent electronically to a different pharmacy, notify our office through [facility] or by phone at [phone] option 4.     Si Usted Necesita Algo Despus de Su Visita  Tambin puede enviarnos un mensaje a travs de Pharmacist, community. Por lo general respondemos a los mensajes de MyChart en el transcurso de 1 a 2 das hbiles.  Para renovar recetas,  por favor pida a su farmacia que se ponga en contacto con nuestra oficina. Harland Dingwall de fax es [place] [phone].  Si tiene un asunto urgente cuando la clnica est cerrada y que no puede esperar hasta el siguiente da hbil, puede llamar/localizar a su doctor(a) al nmero que aparece a continuacin.   Por favor, tenga en cuenta que aunque hacemos todo lo posible para estar disponibles para asuntos urgentes fuera del horario de [place], no estamos disponibles las 24 horas del da, los 7 das de la [place].   Si tiene un problema urgente y no puede comunicarse con nosotros, puede optar por buscar atencin mdica  en el consultorio de su doctor(a), en una clnica privada, en un centro de atencin urgente o en una sala de emergencias.  Si tiene Engineering geologist, por favor llame inmediatamente al 911 o vaya a la sala de emergencias.  Nmeros de bper  - Dr. Nehemiah Massed: [phone]  - Dra. Moye: [phone]  - Dra. Nicole Kindred: [phone]  En caso de inclemencias del [place], por favor llame a Johnsie Kindred principal al [phone] para una actualizacin sobre el [place] de cualquier retraso o cierre.  Consejos para la medicacin en dermatologa: Por favor, guarde las cajas en las que vienen los medicamentos de uso tpico para ayudarle a seguir las instrucciones sobre dnde y cmo usarlos. Las farmacias generalmente imprimen las instrucciones del medicamento slo en las cajas y no directamente en los tubos del [place].   Si su medicamento es muy caro, por favor, pngase en contacto con Zigmund Daniel llamando al [phone] y presione la opcin 4 o envenos un mensaje a travs de Pharmacist, community.   No podemos decirle cul ser su copago por los medicamentos por adelantado ya que esto es diferente dependiendo de la cobertura de su seguro. Sin embargo, es posible que podamos encontrar un medicamento sustituto a Electrical engineer un formulario para que el seguro cubra el medicamento que se  considera necesario.   Si se requiere una autorizacin previa para que su compaa de seguros [place] su medicamento, por favor permtanos de 1 a 2 das hbiles para completar este proceso.  Los precios de los medicamentos varan con frecuencia dependiendo del Environmental consultant de dnde se surte la receta y alguna farmacias pueden ofrecer precios ms baratos.  El sitio web www.goodrx.com tiene cupones para medicamentos de Airline pilot. Los precios aqu no tienen en cuenta lo que podra costar con la ayuda del seguro (puede ser ms barato con su seguro), pero el sitio web puede darle el precio si no utiliz Research scientist (physical sciences).  - Puede imprimir el cupn correspondiente y llevarlo con su receta a la farmacia.  - Tambin puede pasar por nuestra oficina durante el horario de atencin regular y Charity fundraiser una tarjeta de cupones de GoodRx.  - Si necesita que su receta se enve electrnicamente a una farmacia diferente, informe a nuestra oficina a travs de MyChart de [place] o por telfono llamando al [phone] y presione la opcin 4.

## 2021-05-20 NOTE — Progress Notes (Signed)
Follow-Up Visit   Subjective  Gavin Maxwell is a 76 y.o. male who presents for the following: Actinic Keratosis (6 month recheck. Hx of LN2 Tx. PDT Tx 12/2020 on scalp and forehead. Patient reports some improvement in overall texture of skin on scalp and face). The patient has spots, moles and lesions to be evaluated, some may be new or changing and the patient has concerns that these could be cancer.  The following portions of the chart were reviewed this encounter and updated as appropriate:  Tobacco   Allergies   Meds   Problems   Med Hx   Surg Hx   Fam Hx      Review of Systems: No other skin or systemic complaints except as noted in HPI or Assessment and Plan.  Objective  Well appearing patient in no apparent distress; mood and affect are within normal limits.  A focused examination was performed including head, including the scalp, face, neck, nose, ears, eyelids, and lips. Relevant physical exam findings are noted in the Assessment and Plan.  face and ears x9 (9) Erythematous thin papules/macules with gritty scale.   Left Forehead x1, right mandible x1 (2) Erythematous keratotic or waxy stuck-on papule or plaque.   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 They are chronic with expected duration over 1 year. A portion of actinic keratoses will progress to squamous cell carcinoma of the skin. It is not possible to reliably predict which spots will progress to skin cancer and so treatment is recommended to prevent development of skin cancer.  Recommend daily broad spectrum sunscreen SPF 30+ to sun-exposed areas, reapply every 2 hours as needed.  Recommend staying in the shade or wearing long sleeves, sun glasses (UVA+UVB protection) and wide brim hats (4-inch brim around the entire circumference of the hat). Call for new or changing  lesions.  Destruction of lesion - face and ears x9 Complexity: simple   Destruction method: cryotherapy   Informed consent: discussed and consent obtained   Timeout:  patient name, date of birth, surgical site, and procedure verified Lesion destroyed using liquid nitrogen: Yes   Region frozen until ice ball extended beyond lesion: Yes   Outcome: patient tolerated procedure well with no complications   Post-procedure details: wound care instructions given    Inflamed seborrheic keratosis (2) Left Forehead x1, right mandible x1  Destruction of lesion - Left Forehead x1, right mandible x1 Complexity: simple   Destruction method: cryotherapy   Informed consent: discussed and consent obtained   Timeout:  patient name, date of birth, surgical site, and procedure verified Lesion destroyed using liquid nitrogen: Yes   Region frozen until ice ball extended beyond lesion: Yes   Outcome: patient tolerated procedure well with no complications   Post-procedure details: wound care instructions given    Actinic Damage - chronic, secondary to cumulative UV radiation exposure/sun exposure over time - diffuse scaly erythematous macules with underlying dyspigmentation - Recommend daily broad spectrum sunscreen SPF 30+ to sun-exposed areas, reapply every 2 hours as needed.  - Recommend staying in the shade or wearing long sleeves, sun glasses (UVA+UVB protection) and wide brim hats (4-inch brim around the entire circumference of the hat). - Call for new or changing lesions.  Return in about 6 months (around 11/17/2021) for AK Follow Up, UBSE.  I, Emelia Salisbury, CMA, am acting as scribe  for Sarina Ser, MD. Documentation: I have reviewed the above documentation for accuracy and completeness, and I agree with the above.  Sarina Ser, MD

## 2021-05-24 ENCOUNTER — Encounter: Payer: Self-pay | Admitting: Dermatology

## 2021-05-30 ENCOUNTER — Telehealth: Payer: Self-pay | Admitting: Cardiovascular Disease

## 2021-05-30 NOTE — Telephone Encounter (Signed)
° °  Pre-operative Risk Assessment    Patient Name: Gavin Maxwell  DOB: 06/25/1945 MRN: [id number]      Request for Surgical Clearance    Procedure:   R side lumbar 3-4, 4-5 transforaminal lumbar interbody fusion and decompression with instrumentation and allograft   Date of Surgery:  Clearance 06/05/21                                 Surgeon:  Phylliss Bob  Surgeon's Group or Practice Name:  [facility] Phone number:  [phone] Fax number:  [phone]   Type of Clearance Requested:   - Medical  - Pharmacy:  Hold    please advise    Type of Anesthesia:  General    Additional requests/questions:    SignedClarisse Gouge   05/30/2021, 8:16 AM

## 2021-05-31 NOTE — Telephone Encounter (Signed)
° °  Primary Cardiologist: Ida Rogue, MD  Chart reviewed as part of pre-operative protocol coverage. Given past medical history and time since last visit, based on ACC/AHA guidelines, Gavin Maxwell would be at acceptable risk for the planned procedure without further cardiovascular testing.   His aspirin may be held for 7 days prior to his surgery.  Please resume as soon as hemostasis is achieved.  I will route this recommendation to the requesting party via Epic fax function and remove from pre-op pool.  Please call with questions.  Jossie Ng. Jordan Pardini NP-C    05/31/2021, 12:32 PM [place] [address] Office [phone] Fax [phone]

## 2021-05-31 NOTE — Pre-Procedure Instructions (Addendum)
Surgical Instructions    Your procedure is scheduled on Wednesday, March 1st.  Report to [facility] Main Entrance "A" at 05:30 A.M., then check in with the Admitting office.  Call this number if you have problems the morning of surgery:  [phone]   If you have any questions prior to your surgery date call [phone]: Open Monday-Friday 8am-4pm    Remember:  Do not eat after midnight the night before your surgery  You may drink clear liquids until 04:30 AM the morning of your surgery.   Clear liquids allowed are: Water, Non-Citrus Juices (without pulp), Carbonated Beverages, Clear Tea, Black Coffee Only (NO MILK, CREAM OR POWDERED CREAMER of any kind), and Gatorade.   Patient Instructions  The night before surgery:  No food after midnight. ONLY clear liquids after midnight  The day of surgery (if you do NOT have diabetes):  Drink ONE (1) Pre-Surgery Clear Ensure by 04:30 AM the morning of surgery. Drink in one sitting. Do not sip.  This drink was given to you during your hospital  pre-op appointment visit.  Nothing else to drink after completing the  Pre-Surgery Clear Ensure.          If you have questions, please contact your surgeons office.      Take these medicines the morning of surgery with A SIP OF WATER  atorvastatin (LIPITOR) brimonidine-timolol (COMBIGAN) eye drops fenofibrate  fluticasone (FLONASE) fluticasone-salmeterol (ADVAIR)  gabapentin (NEURONTIN)  metoprolol succinate (TOPROL-XL)  pantoprazole (PROTONIX)   If needed: acetaminophen (TYLENOL) albuterol (PROVENTIL HFA;VENTOLIN HFA) - bring inhaler with you on day of surgery if needed  Hold Aspirin 7 days prior to surgery, per order (last dose 2/22)  As of today, STOP taking any Aleve, Naproxen, Ibuprofen, Motrin, Advil, Goody's, BC's, all herbal medications, fish oil, and all vitamins.                     Do NOT Smoke (Tobacco/Vaping) for 24 hours prior to your procedure.  If you use a  CPAP at night, you may bring your mask/headgear for your overnight stay.   Contacts, glasses, piercing's, hearing aid's, dentures or partials may not be worn into surgery, please bring cases for these belongings.    For patients admitted to the hospital, discharge time will be determined by your treatment team.   Patients discharged the day of surgery will not be allowed to drive home, and someone needs to stay with them for 24 hours.  NO VISITORS WILL BE ALLOWED IN PRE-OP WHERE PATIENTS ARE PREPPED FOR SURGERY.  ONLY 1 SUPPORT PERSON MAY BE PRESENT IN THE WAITING ROOM WHILE YOU ARE IN SURGERY.  IF YOU ARE TO BE ADMITTED, ONCE YOU ARE IN YOUR ROOM YOU WILL BE ALLOWED TWO (2) VISITORS. (1) VISITOR MAY STAY OVERNIGHT BUT MUST ARRIVE TO THE ROOM BY 8pm.  Minor children may have two parents present. Special consideration for safety and communication needs will be reviewed on a case by case basis.   Special instructions:   [facility]- Preparing For Surgery  Before surgery, you can play an important role. Because skin is not sterile, your skin needs to be as free of germs as possible. You can reduce the number of germs on your skin by washing with CHG (chlorahexidine gluconate) Soap before surgery.  CHG is an antiseptic cleaner which kills germs and bonds with the skin to continue killing germs even after washing.    Oral Hygiene is also important to reduce your  risk of infection.  Remember - BRUSH YOUR TEETH THE MORNING OF SURGERY WITH YOUR REGULAR TOOTHPASTE  Please do not use if you have an allergy to CHG or antibacterial soaps. If your skin becomes reddened/irritated stop using the CHG.  Do not shave (including legs and underarms) for at least 48 hours prior to first CHG shower. It is OK to shave your face.  Please follow these instructions carefully.   Shower the NIGHT BEFORE SURGERY and the MORNING OF SURGERY  If you chose to wash your hair, wash your hair first as usual with your normal  shampoo.  After you shampoo, rinse your hair and body thoroughly to remove the shampoo.  Use CHG Soap as you would any other liquid soap. You can apply CHG directly to the skin and wash gently with a scrungie or a clean washcloth.   Apply the CHG Soap to your body ONLY FROM THE NECK DOWN.  Do not use on open wounds or open sores. Avoid contact with your eyes, ears, mouth and genitals (private parts). Wash Face and genitals (private parts)  with your normal soap.   Wash thoroughly, paying special attention to the area where your surgery will be performed.  Thoroughly rinse your body with warm water from the neck down.  DO NOT shower/wash with your normal soap after using and rinsing off the CHG Soap.  Pat yourself dry with a CLEAN TOWEL.  Wear CLEAN PAJAMAS to bed the night before surgery  Place CLEAN SHEETS on your bed the night before your surgery  DO NOT SLEEP WITH PETS.   Day of Surgery: Shower with CHG soap. Do not wear jewelry Do not wear lotions, powders, colognes, or deodorant. Do not shave 48 hours prior to surgery.  Men may shave face and neck. Do not bring valuables to the hospital. [facility] is not responsible for any belongings or valuables. Wear Clean/Comfortable clothing the morning of surgery Remember to brush your teeth WITH YOUR REGULAR TOOTHPASTE.   Please read over the following fact sheets that you were given.   3 days prior to your procedure or After your COVID test   You are not required to quarantine however you are required to wear a well-fitting mask when you are out and around people not in your household. If your mask becomes wet or soiled, replace with a new one.   Wash your hands often with soap and water for 20 seconds or clean your hands with an alcohol-based hand sanitizer that contains at least 60% alcohol.   Do not share personal items.   Notify your provider:  o if you are in close contact with someone who has COVID  o or if you  develop a fever of 100.4 or greater, sneezing, cough, sore throat, shortness of breath or body aches.

## 2021-06-03 ENCOUNTER — Other Ambulatory Visit: Payer: Self-pay

## 2021-06-03 ENCOUNTER — Encounter (HOSPITAL_COMMUNITY)
Admission: RE | Admit: 2021-06-03 | Discharge: 2021-06-03 | Disposition: A | Discharge status: Home / Self Care | Payer: PPO | Source: Ambulatory Visit | Attending: Orthopedic Surgery | Admitting: Orthopedic Surgery

## 2021-06-03 ENCOUNTER — Encounter (HOSPITAL_COMMUNITY): Payer: Self-pay

## 2021-06-03 VITALS — BP 143/72 | HR 76 | Temp 97.6°F | Resp 18 | Ht 71.0 in | Wt 234.8 lb

## 2021-06-03 DIAGNOSIS — Z801 Family history of malignant neoplasm of trachea, bronchus and lung: Secondary | ICD-10-CM | POA: Diagnosis not present

## 2021-06-03 DIAGNOSIS — Z87891 Personal history of nicotine dependence: Secondary | ICD-10-CM | POA: Diagnosis not present

## 2021-06-03 DIAGNOSIS — M4316 Spondylolisthesis, lumbar region: Secondary | ICD-10-CM | POA: Diagnosis present

## 2021-06-03 DIAGNOSIS — Z885 Allergy status to narcotic agent status: Secondary | ICD-10-CM | POA: Diagnosis not present

## 2021-06-03 DIAGNOSIS — Z823 Family history of stroke: Secondary | ICD-10-CM | POA: Diagnosis not present

## 2021-06-03 DIAGNOSIS — F32A Depression, unspecified: Secondary | ICD-10-CM | POA: Diagnosis present

## 2021-06-03 DIAGNOSIS — Z803 Family history of malignant neoplasm of breast: Secondary | ICD-10-CM | POA: Diagnosis not present

## 2021-06-03 DIAGNOSIS — N183 Chronic kidney disease, stage 3 unspecified: Secondary | ICD-10-CM | POA: Diagnosis present

## 2021-06-03 DIAGNOSIS — G4733 Obstructive sleep apnea (adult) (pediatric): Secondary | ICD-10-CM | POA: Insufficient documentation

## 2021-06-03 DIAGNOSIS — Z20822 Contact with and (suspected) exposure to covid-19: Secondary | ICD-10-CM | POA: Insufficient documentation

## 2021-06-03 DIAGNOSIS — M532X6 Spinal instabilities, lumbar region: Secondary | ICD-10-CM | POA: Diagnosis not present

## 2021-06-03 DIAGNOSIS — M48062 Spinal stenosis, lumbar region with neurogenic claudication: Secondary | ICD-10-CM | POA: Diagnosis not present

## 2021-06-03 DIAGNOSIS — M5136 Other intervertebral disc degeneration, lumbar region: Secondary | ICD-10-CM | POA: Diagnosis not present

## 2021-06-03 DIAGNOSIS — M5416 Radiculopathy, lumbar region: Secondary | ICD-10-CM | POA: Diagnosis not present

## 2021-06-03 DIAGNOSIS — I129 Hypertensive chronic kidney disease with stage 1 through stage 4 chronic kidney disease, or unspecified chronic kidney disease: Secondary | ICD-10-CM | POA: Insufficient documentation

## 2021-06-03 DIAGNOSIS — I10 Essential (primary) hypertension: Secondary | ICD-10-CM

## 2021-06-03 DIAGNOSIS — Z01818 Encounter for other preprocedural examination: Secondary | ICD-10-CM

## 2021-06-03 DIAGNOSIS — Z8261 Family history of arthritis: Secondary | ICD-10-CM | POA: Diagnosis not present

## 2021-06-03 DIAGNOSIS — M5116 Intervertebral disc disorders with radiculopathy, lumbar region: Secondary | ICD-10-CM | POA: Diagnosis present

## 2021-06-03 DIAGNOSIS — I471 Supraventricular tachycardia: Secondary | ICD-10-CM | POA: Insufficient documentation

## 2021-06-03 DIAGNOSIS — Z96611 Presence of right artificial shoulder joint: Secondary | ICD-10-CM | POA: Diagnosis present

## 2021-06-03 DIAGNOSIS — D649 Anemia, unspecified: Secondary | ICD-10-CM | POA: Insufficient documentation

## 2021-06-03 DIAGNOSIS — Z9049 Acquired absence of other specified parts of digestive tract: Secondary | ICD-10-CM | POA: Diagnosis not present

## 2021-06-03 DIAGNOSIS — M2428 Disorder of ligament, vertebrae: Secondary | ICD-10-CM | POA: Diagnosis present

## 2021-06-03 DIAGNOSIS — F418 Other specified anxiety disorders: Secondary | ICD-10-CM | POA: Diagnosis not present

## 2021-06-03 DIAGNOSIS — Z79899 Other long term (current) drug therapy: Secondary | ICD-10-CM | POA: Diagnosis not present

## 2021-06-03 DIAGNOSIS — Z8572 Personal history of non-Hodgkin lymphomas: Secondary | ICD-10-CM | POA: Diagnosis not present

## 2021-06-03 DIAGNOSIS — Z96643 Presence of artificial hip joint, bilateral: Secondary | ICD-10-CM | POA: Diagnosis present

## 2021-06-03 DIAGNOSIS — Z981 Arthrodesis status: Secondary | ICD-10-CM | POA: Diagnosis not present

## 2021-06-03 DIAGNOSIS — K219 Gastro-esophageal reflux disease without esophagitis: Secondary | ICD-10-CM | POA: Diagnosis present

## 2021-06-03 DIAGNOSIS — Z881 Allergy status to other antibiotic agents status: Secondary | ICD-10-CM | POA: Diagnosis not present

## 2021-06-03 DIAGNOSIS — M4326 Fusion of spine, lumbar region: Secondary | ICD-10-CM | POA: Diagnosis not present

## 2021-06-03 DIAGNOSIS — Z888 Allergy status to other drugs, medicaments and biological substances status: Secondary | ICD-10-CM | POA: Diagnosis not present

## 2021-06-03 DIAGNOSIS — M48061 Spinal stenosis, lumbar region without neurogenic claudication: Secondary | ICD-10-CM | POA: Diagnosis present

## 2021-06-03 DIAGNOSIS — Z01812 Encounter for preprocedural laboratory examination: Secondary | ICD-10-CM | POA: Insufficient documentation

## 2021-06-03 DIAGNOSIS — F419 Anxiety disorder, unspecified: Secondary | ICD-10-CM | POA: Diagnosis present

## 2021-06-03 DIAGNOSIS — Z8249 Family history of ischemic heart disease and other diseases of the circulatory system: Secondary | ICD-10-CM | POA: Diagnosis not present

## 2021-06-03 HISTORY — DX: Chronic kidney disease, stage 3 unspecified: N18.30

## 2021-06-03 LAB — SARS CORONAVIRUS 2 BY RT PCR (HOSPITAL ORDER, PERFORMED IN ~~LOC~~ HOSPITAL LAB): SARS Coronavirus 2: NEGATIVE

## 2021-06-03 LAB — CBC
HCT: 34 % — ABNORMAL LOW (ref 39.0–52.0)
Hemoglobin: 11.3 g/dL — ABNORMAL LOW (ref 13.0–17.0)
MCH: 32.1 pg (ref 26.0–34.0)
MCHC: 33.2 g/dL (ref 30.0–36.0)
MCV: 96.6 fL (ref 80.0–100.0)
Platelets: 220 10*3/uL (ref 150–400)
RBC: 3.52 MIL/uL — ABNORMAL LOW (ref 4.22–5.81)
RDW: 13.4 % (ref 11.5–15.5)
WBC: 6.5 10*3/uL (ref 4.0–10.5)
nRBC: 0 % (ref 0.0–0.2)

## 2021-06-03 LAB — BASIC METABOLIC PANEL
Anion gap: 6 (ref 5–15)
BUN: 25 mg/dL — ABNORMAL HIGH (ref 8–23)
CO2: 28 mmol/L (ref 22–32)
Calcium: 9.3 mg/dL (ref 8.9–10.3)
Chloride: 106 mmol/L (ref 98–111)
Creatinine, Ser: 1.6 mg/dL — ABNORMAL HIGH (ref 0.61–1.24)
GFR, Estimated: 44 mL/min — ABNORMAL LOW (ref >60)
Glucose, Bld: 107 mg/dL — ABNORMAL HIGH (ref 70–99)
Potassium: 4.7 mmol/L (ref 3.5–5.1)
Sodium: 140 mmol/L (ref 135–145)

## 2021-06-03 LAB — SURGICAL PCR SCREEN
MRSA, PCR: NEGATIVE
Staphylococcus aureus: NEGATIVE

## 2021-06-03 LAB — TYPE AND SCREEN
ABO/RH(D): O NEG
Antibody Screen: NEGATIVE

## 2021-06-03 NOTE — Progress Notes (Signed)
PCP - Dr. Fulton Reek Cardiologist - Dr. Ida Rogue- clearance obtained  PPM/ICD - denies   Chest x-ray - 05/16/21 EKG - 05/13/21 Stress Test - 10 + years ago per pt, normal ECHO - 04/09/17 Cardiac Cath - 20 + years ago   Sleep Study - Around 2015 per pt, OSA+ CPAP - yes  DM- denies  Blood Thinner Instructions: n/a Aspirin Instructions: hold for 7 days per order. Pt has held since 2/20  ERAS Protcol - yes PRE-SURGERY Ensure given at PAT  COVID TEST- 06/03/21 at PAT   Anesthesia review: yes, cardiac hx  Patient denies shortness of breath, fever, cough and chest pain at PAT appointment   All instructions explained to the patient, with a verbal understanding of the material. Patient agrees to go over the instructions while at home for a better understanding. Patient also instructed to wear a mask in public after being tested for COVID-19. The opportunity to ask questions was provided.

## 2021-06-04 ENCOUNTER — Encounter (HOSPITAL_COMMUNITY): Payer: Self-pay

## 2021-06-04 NOTE — Anesthesia Preprocedure Evaluation (Addendum)
Anesthesia Evaluation  Patient identified by MRN, date of birth, ID band Patient awake    Reviewed: Allergy & Precautions, H&P , NPO status , Patient's Chart, lab work & pertinent test results  Airway Mallampati: II   Neck ROM: full    Dental   Pulmonary sleep apnea , former smoker,    breath sounds clear to auscultation       Cardiovascular hypertension, + dysrhythmias  Rhythm:regular Rate:Normal  S/p ablation for SVT   Neuro/Psych PSYCHIATRIC DISORDERS Anxiety Depression    GI/Hepatic GERD  ,  Endo/Other    Renal/GU Renal InsufficiencyRenal disease     Musculoskeletal  (+) Arthritis ,   Abdominal   Peds  Hematology   Anesthesia Other Findings   Reproductive/Obstetrics                            Anesthesia Physical Anesthesia Plan  ASA: 3  Anesthesia Plan: General   Post-op Pain Management:    Induction: Intravenous  PONV Risk Score and Plan: 2 and Ondansetron, Dexamethasone and Treatment may vary due to age or medical condition  Airway Management Planned: Oral ETT  Additional Equipment:   Intra-op Plan:   Post-operative Plan: Extubation in OR  Informed Consent: I have reviewed the patients History and Physical, chart, labs and discussed the procedure including the risks, benefits and alternatives for the proposed anesthesia with the patient or authorized representative who has indicated his/her understanding and acceptance.     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  Last seen by Dr. Rockey Situ 05/13/2021.  Per note, no further episodes of tachycardia, doing well from cardiac standpoint.  Upcoming surgery also addressed.  Per note, "Acceptable risk for back surgery in March 2023, no further testing  needed. We will need to stop aspirin 7 days prior to surgery."  OSA on CPAP.  Preop labs reviewed, creatinine mildly elevated 1.60 (consistent with history of CKD 3), mild anemia with hemoglobin of 11.3, otherwise unremarkable.  EKG 05/13/2021: NSR.  Rate 69.  LAD.  TTE 04/10/2017 (Care Everywhere): INTERPRETATION  NORMAL LEFT VENTRICULAR SYSTOLIC FUNCTION  WITH MILD LVH  NORMAL RIGHT VENTRICULAR SYSTOLIC FUNCTION  NO VALVULAR STENOSIS  MILD TR, PR  TRIVIAL MR  EF 50%   )       Anesthesia Quick Evaluation

## 2021-06-04 NOTE — Progress Notes (Signed)
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  Last seen by Dr. Rockey Situ 05/13/2021.  Per note, no further episodes of tachycardia, doing well from cardiac standpoint.  Upcoming surgery also addressed.  Per note, "Acceptable risk for back surgery in March 2023, no further testing needed. We will need to stop aspirin 7 days prior to surgery."  OSA on CPAP.  Preop labs reviewed, creatinine mildly elevated 1.60 (consistent with history of CKD 3), mild anemia with hemoglobin of 11.3, otherwise unremarkable.  EKG 05/13/2021: NSR.  Rate 69.  LAD.  TTE 04/10/2017 (Care Everywhere): INTERPRETATION  NORMAL LEFT VENTRICULAR SYSTOLIC FUNCTION   WITH MILD LVH  NORMAL RIGHT VENTRICULAR SYSTOLIC FUNCTION  NO VALVULAR STENOSIS  MILD TR, PR  TRIVIAL MR  EF 50%    Wynonia Musty [facility] Short Stay Center/Anesthesiology Phone [phone] 06/04/2021 12:55 PM

## 2021-06-05 ENCOUNTER — Inpatient Hospital Stay (HOSPITAL_COMMUNITY)
Admission: RE | Disposition: A | Discharge status: Home / Self Care | Payer: Self-pay | Source: Home / Self Care | Attending: Orthopedic Surgery

## 2021-06-05 ENCOUNTER — Inpatient Hospital Stay (HOSPITAL_COMMUNITY): Payer: PPO

## 2021-06-05 ENCOUNTER — Encounter (HOSPITAL_COMMUNITY): Payer: Self-pay | Admitting: Orthopedic Surgery

## 2021-06-05 ENCOUNTER — Inpatient Hospital Stay (HOSPITAL_COMMUNITY): Payer: PPO | Admitting: Certified Registered Nurse Anesthetist

## 2021-06-05 ENCOUNTER — Other Ambulatory Visit: Payer: Self-pay

## 2021-06-05 ENCOUNTER — Inpatient Hospital Stay (HOSPITAL_COMMUNITY)
Admission: RE | Admit: 2021-06-05 | Discharge: 2021-06-06 | DRG: 455 | Disposition: A | Discharge status: Home Health | Payer: PPO | Attending: Orthopedic Surgery | Admitting: Orthopedic Surgery

## 2021-06-05 ENCOUNTER — Inpatient Hospital Stay (HOSPITAL_COMMUNITY): Payer: PPO | Admitting: Physician Assistant

## 2021-06-05 DIAGNOSIS — M48061 Spinal stenosis, lumbar region without neurogenic claudication: Secondary | ICD-10-CM | POA: Diagnosis not present

## 2021-06-05 DIAGNOSIS — F419 Anxiety disorder, unspecified: Secondary | ICD-10-CM | POA: Diagnosis present

## 2021-06-05 DIAGNOSIS — Z8572 Personal history of non-Hodgkin lymphomas: Secondary | ICD-10-CM | POA: Diagnosis not present

## 2021-06-05 DIAGNOSIS — Z9049 Acquired absence of other specified parts of digestive tract: Secondary | ICD-10-CM

## 2021-06-05 DIAGNOSIS — Z8249 Family history of ischemic heart disease and other diseases of the circulatory system: Secondary | ICD-10-CM

## 2021-06-05 DIAGNOSIS — Z7982 Long term (current) use of aspirin: Secondary | ICD-10-CM

## 2021-06-05 DIAGNOSIS — N183 Chronic kidney disease, stage 3 unspecified: Secondary | ICD-10-CM | POA: Diagnosis present

## 2021-06-05 DIAGNOSIS — M5116 Intervertebral disc disorders with radiculopathy, lumbar region: Secondary | ICD-10-CM | POA: Diagnosis present

## 2021-06-05 DIAGNOSIS — Z20822 Contact with and (suspected) exposure to covid-19: Secondary | ICD-10-CM | POA: Diagnosis present

## 2021-06-05 DIAGNOSIS — Z791 Long term (current) use of non-steroidal anti-inflammatories (NSAID): Secondary | ICD-10-CM

## 2021-06-05 DIAGNOSIS — Z79899 Other long term (current) drug therapy: Secondary | ICD-10-CM | POA: Diagnosis not present

## 2021-06-05 DIAGNOSIS — Z823 Family history of stroke: Secondary | ICD-10-CM

## 2021-06-05 DIAGNOSIS — I10 Essential (primary) hypertension: Secondary | ICD-10-CM | POA: Diagnosis not present

## 2021-06-05 DIAGNOSIS — Z885 Allergy status to narcotic agent status: Secondary | ICD-10-CM | POA: Diagnosis not present

## 2021-06-05 DIAGNOSIS — Z96611 Presence of right artificial shoulder joint: Secondary | ICD-10-CM | POA: Diagnosis present

## 2021-06-05 DIAGNOSIS — I129 Hypertensive chronic kidney disease with stage 1 through stage 4 chronic kidney disease, or unspecified chronic kidney disease: Secondary | ICD-10-CM | POA: Diagnosis present

## 2021-06-05 DIAGNOSIS — Z803 Family history of malignant neoplasm of breast: Secondary | ICD-10-CM

## 2021-06-05 DIAGNOSIS — Z881 Allergy status to other antibiotic agents status: Secondary | ICD-10-CM | POA: Diagnosis not present

## 2021-06-05 DIAGNOSIS — M4316 Spondylolisthesis, lumbar region: Secondary | ICD-10-CM

## 2021-06-05 DIAGNOSIS — Z419 Encounter for procedure for purposes other than remedying health state, unspecified: Secondary | ICD-10-CM

## 2021-06-05 DIAGNOSIS — F32A Depression, unspecified: Secondary | ICD-10-CM | POA: Diagnosis present

## 2021-06-05 DIAGNOSIS — Z801 Family history of malignant neoplasm of trachea, bronchus and lung: Secondary | ICD-10-CM | POA: Diagnosis not present

## 2021-06-05 DIAGNOSIS — Z888 Allergy status to other drugs, medicaments and biological substances status: Secondary | ICD-10-CM

## 2021-06-05 DIAGNOSIS — Z87891 Personal history of nicotine dependence: Secondary | ICD-10-CM | POA: Diagnosis not present

## 2021-06-05 DIAGNOSIS — M2428 Disorder of ligament, vertebrae: Secondary | ICD-10-CM | POA: Diagnosis present

## 2021-06-05 DIAGNOSIS — K219 Gastro-esophageal reflux disease without esophagitis: Secondary | ICD-10-CM | POA: Diagnosis present

## 2021-06-05 DIAGNOSIS — M5416 Radiculopathy, lumbar region: Secondary | ICD-10-CM

## 2021-06-05 DIAGNOSIS — Z8261 Family history of arthritis: Secondary | ICD-10-CM

## 2021-06-05 DIAGNOSIS — Z981 Arthrodesis status: Secondary | ICD-10-CM | POA: Diagnosis not present

## 2021-06-05 DIAGNOSIS — M4326 Fusion of spine, lumbar region: Secondary | ICD-10-CM | POA: Diagnosis not present

## 2021-06-05 DIAGNOSIS — Z96643 Presence of artificial hip joint, bilateral: Secondary | ICD-10-CM | POA: Diagnosis present

## 2021-06-05 DIAGNOSIS — M5136 Other intervertebral disc degeneration, lumbar region: Secondary | ICD-10-CM | POA: Diagnosis not present

## 2021-06-05 DIAGNOSIS — M5410 Radiculopathy, site unspecified: Secondary | ICD-10-CM | POA: Diagnosis present

## 2021-06-05 HISTORY — PX: TRANSFORAMINAL LUMBAR INTERBODY FUSION (TLIF) WITH PEDICLE SCREW FIXATION 2 LEVEL: SHX6142

## 2021-06-05 SURGERY — TRANSFORAMINAL LUMBAR INTERBODY FUSION (TLIF) WITH PEDICLE SCREW FIXATION 2 LEVEL
Anesthesia: General | Site: Spine Lumbar | Laterality: Right

## 2021-06-05 MED ORDER — MENTHOL 3 MG MT LOZG: 1.0000 | LOZENGE | OROMUCOSAL | Status: DC | PRN

## 2021-06-05 MED ORDER — MIDAZOLAM HCL 2 MG/2ML IJ SOLN
INTRAMUSCULAR | Status: AC
  Filled 2021-06-05: qty 2

## 2021-06-05 MED ORDER — SUGAMMADEX SODIUM 200 MG/2ML IV SOLN
INTRAVENOUS | Status: DC | PRN
  Administered 2021-06-05: 200 mg via INTRAVENOUS

## 2021-06-05 MED ORDER — PROPOFOL 10 MG/ML IV BOLUS
INTRAVENOUS | Status: AC
  Filled 2021-06-05: qty 20

## 2021-06-05 MED ORDER — ROCURONIUM BROMIDE 10 MG/ML (PF) SYRINGE
PREFILLED_SYRINGE | INTRAVENOUS | Status: DC | PRN
Start: 2021-06-05 — End: 2021-06-05
  Administered 2021-06-05: 20 mg via INTRAVENOUS
  Administered 2021-06-05: 50 mg via INTRAVENOUS
  Administered 2021-06-05: 10 mg via INTRAVENOUS
  Administered 2021-06-05 (×2): 20 mg via INTRAVENOUS

## 2021-06-05 MED ORDER — POTASSIUM CHLORIDE IN NACL 20-0.9 MEQ/L-% IV SOLN: INTRAVENOUS | Status: DC

## 2021-06-05 MED ORDER — ALUM & MAG HYDROXIDE-SIMETH 200-200-20 MG/5ML PO SUSP: 30.0000 mL | Freq: Four times a day (QID) | ORAL | Status: DC | PRN

## 2021-06-05 MED ORDER — PANTOPRAZOLE SODIUM 40 MG PO TBEC
40.0000 mg | DELAYED_RELEASE_TABLET | Freq: Every day | ORAL | Status: DC
Start: 2021-06-06 — End: 2021-06-06
  Administered 2021-06-06: 40 mg via ORAL
  Filled 2021-06-05: qty 1

## 2021-06-05 MED ORDER — FENTANYL CITRATE (PF) 250 MCG/5ML IJ SOLN
INTRAMUSCULAR | Status: AC
  Filled 2021-06-05: qty 5

## 2021-06-05 MED ORDER — LIDOCAINE 2% (20 MG/ML) 5 ML SYRINGE
INTRAMUSCULAR | Status: DC | PRN
  Administered 2021-06-05: 40 mg via INTRAVENOUS

## 2021-06-05 MED ORDER — ONDANSETRON HCL 4 MG/2ML IJ SOLN
INTRAMUSCULAR | Status: DC | PRN
Start: 2021-06-05 — End: 2021-06-05
  Administered 2021-06-05: 4 mg via INTRAVENOUS

## 2021-06-05 MED ORDER — MAGNESIUM OXIDE -MG SUPPLEMENT 400 (240 MG) MG PO TABS
400.0000 mg | ORAL_TABLET | Freq: Three times a day (TID) | ORAL | Status: DC
  Administered 2021-06-05 – 2021-06-06 (×3): 400 mg via ORAL
  Filled 2021-06-05 (×3): qty 1

## 2021-06-05 MED ORDER — ACETAMINOPHEN 650 MG RE SUPP: 650.0000 mg | RECTAL | Status: DC | PRN

## 2021-06-05 MED ORDER — BUPIVACAINE-EPINEPHRINE 0.25% -1:200000 IJ SOLN
INTRAMUSCULAR | Status: DC | PRN
  Administered 2021-06-05: 9 mL
  Administered 2021-06-05: 20 mL

## 2021-06-05 MED ORDER — MIDAZOLAM HCL 2 MG/2ML IJ SOLN
INTRAMUSCULAR | Status: DC | PRN
  Administered 2021-06-05 (×2): .5 mg via INTRAVENOUS

## 2021-06-05 MED ORDER — PROPOFOL 1000 MG/100ML IV EMUL
INTRAVENOUS | Status: AC
  Filled 2021-06-05: qty 100

## 2021-06-05 MED ORDER — BRIMONIDINE TARTRATE 0.2 % OP SOLN
1.0000 [drp] | Freq: Two times a day (BID) | OPHTHALMIC | Status: DC
  Administered 2021-06-05 – 2021-06-06 (×2): 1 [drp] via OPHTHALMIC
  Filled 2021-06-05: qty 5

## 2021-06-05 MED ORDER — ATORVASTATIN CALCIUM 80 MG PO TABS
80.0000 mg | ORAL_TABLET | Freq: Every day | ORAL | Status: DC
  Administered 2021-06-06: 80 mg via ORAL
  Filled 2021-06-05: qty 1

## 2021-06-05 MED ORDER — ALBUMIN HUMAN 5 % IV SOLN
INTRAVENOUS | Status: DC | PRN
Start: 2021-06-05 — End: 2021-06-05

## 2021-06-05 MED ORDER — ALBUMIN HUMAN 5 % IV SOLN
12.5000 g | Freq: Once | INTRAVENOUS | Status: AC
  Administered 2021-06-05: 12.5 g via INTRAVENOUS

## 2021-06-05 MED ORDER — ONDANSETRON HCL 4 MG PO TABS: 4.0000 mg | ORAL_TABLET | Freq: Four times a day (QID) | ORAL | Status: DC | PRN

## 2021-06-05 MED ORDER — CHLORHEXIDINE GLUCONATE 0.12 % MT SOLN
15.0000 mL | Freq: Once | OROMUCOSAL | Status: AC
  Administered 2021-06-05: 15 mL via OROMUCOSAL
  Filled 2021-06-05: qty 15

## 2021-06-05 MED ORDER — TIMOLOL MALEATE 0.5 % OP SOLN
1.0000 [drp] | Freq: Two times a day (BID) | OPHTHALMIC | Status: DC
  Administered 2021-06-05 – 2021-06-06 (×2): 1 [drp] via OPHTHALMIC
  Filled 2021-06-05: qty 5

## 2021-06-05 MED ORDER — GABAPENTIN 300 MG PO CAPS
300.0000 mg | ORAL_CAPSULE | Freq: Three times a day (TID) | ORAL | Status: DC
  Administered 2021-06-05 – 2021-06-06 (×3): 300 mg via ORAL
  Filled 2021-06-05 (×3): qty 1

## 2021-06-05 MED ORDER — FENTANYL CITRATE (PF) 100 MCG/2ML IJ SOLN
INTRAMUSCULAR | Status: AC
  Filled 2021-06-05: qty 2

## 2021-06-05 MED ORDER — ONDANSETRON HCL 4 MG/2ML IJ SOLN: 4.0000 mg | Freq: Four times a day (QID) | INTRAMUSCULAR | Status: DC | PRN

## 2021-06-05 MED ORDER — ADULT MULTIVITAMIN W/MINERALS CH
1.0000 | ORAL_TABLET | Freq: Every day | ORAL | Status: DC
  Administered 2021-06-05 – 2021-06-06 (×2): 1 via ORAL
  Filled 2021-06-05 (×2): qty 1

## 2021-06-05 MED ORDER — LACTATED RINGERS IV SOLN: INTRAVENOUS | Status: DC | PRN

## 2021-06-05 MED ORDER — HEMOSTATIC AGENTS (NO CHARGE) OPTIME
TOPICAL | Status: DC | PRN
  Administered 2021-06-05: 1 via TOPICAL

## 2021-06-05 MED ORDER — FLUTICASONE PROPIONATE 50 MCG/ACT NA SUSP: 2.0000 | Freq: Every day | NASAL | Status: DC

## 2021-06-05 MED ORDER — SODIUM CHLORIDE 0.9% FLUSH: 3.0000 mL | INTRAVENOUS | Status: DC | PRN

## 2021-06-05 MED ORDER — ONDANSETRON HCL 4 MG/2ML IJ SOLN
INTRAMUSCULAR | Status: AC
  Filled 2021-06-05: qty 2

## 2021-06-05 MED ORDER — METHOCARBAMOL 1000 MG/10ML IJ SOLN
500.0000 mg | Freq: Four times a day (QID) | INTRAVENOUS | Status: DC | PRN
  Filled 2021-06-05: qty 5

## 2021-06-05 MED ORDER — METHOCARBAMOL 500 MG PO TABS
500.0000 mg | ORAL_TABLET | Freq: Four times a day (QID) | ORAL | Status: DC | PRN
  Administered 2021-06-05 – 2021-06-06 (×3): 500 mg via ORAL
  Filled 2021-06-05 (×3): qty 1

## 2021-06-05 MED ORDER — MOMETASONE FURO-FORMOTEROL FUM 100-5 MCG/ACT IN AERO
2.0000 | INHALATION_SPRAY | Freq: Two times a day (BID) | RESPIRATORY_TRACT | Status: DC
Start: 2021-06-05 — End: 2021-06-06
  Administered 2021-06-05 – 2021-06-06 (×2): 2 via RESPIRATORY_TRACT
  Filled 2021-06-05: qty 8.8

## 2021-06-05 MED ORDER — ALBUTEROL SULFATE HFA 108 (90 BASE) MCG/ACT IN AERS: 2.0000 | INHALATION_SPRAY | Freq: Four times a day (QID) | RESPIRATORY_TRACT | Status: DC | PRN

## 2021-06-05 MED ORDER — BISACODYL 5 MG PO TBEC: 5.0000 mg | DELAYED_RELEASE_TABLET | Freq: Every day | ORAL | Status: DC | PRN

## 2021-06-05 MED ORDER — FERROUS SULFATE 325 (65 FE) MG PO TABS
325.0000 mg | ORAL_TABLET | Freq: Every day | ORAL | Status: DC
  Administered 2021-06-05 – 2021-06-06 (×2): 325 mg via ORAL
  Filled 2021-06-05 (×2): qty 1

## 2021-06-05 MED ORDER — SENNOSIDES-DOCUSATE SODIUM 8.6-50 MG PO TABS: 1.0000 | ORAL_TABLET | Freq: Every evening | ORAL | Status: DC | PRN

## 2021-06-05 MED ORDER — PHENOL 1.4 % MT LIQD: 1.0000 | OROMUCOSAL | Status: DC | PRN

## 2021-06-05 MED ORDER — ALBUMIN HUMAN 5 % IV SOLN
INTRAVENOUS | Status: AC
  Filled 2021-06-05: qty 250

## 2021-06-05 MED ORDER — DOCUSATE SODIUM 100 MG PO CAPS
100.0000 mg | ORAL_CAPSULE | Freq: Two times a day (BID) | ORAL | Status: DC
  Administered 2021-06-05 – 2021-06-06 (×2): 100 mg via ORAL
  Filled 2021-06-05 (×2): qty 1

## 2021-06-05 MED ORDER — BUPIVACAINE LIPOSOME 1.3 % IJ SUSP
INTRAMUSCULAR | Status: DC | PRN
  Administered 2021-06-05: 20 mL

## 2021-06-05 MED ORDER — BUPIVACAINE-EPINEPHRINE (PF) 0.25% -1:200000 IJ SOLN
INTRAMUSCULAR | Status: AC
  Filled 2021-06-05: qty 30

## 2021-06-05 MED ORDER — OYSTER SHELL CALCIUM/D3 500-5 MG-MCG PO TABS
1.0000 | ORAL_TABLET | Freq: Every day | ORAL | Status: DC
  Administered 2021-06-05 – 2021-06-06 (×2): 1 via ORAL
  Filled 2021-06-05 (×2): qty 1

## 2021-06-05 MED ORDER — VANCOMYCIN HCL 1500 MG/300ML IV SOLN
1500.0000 mg | INTRAVENOUS | Status: AC
  Administered 2021-06-05: 1500 mg via INTRAVENOUS
  Filled 2021-06-05: qty 300

## 2021-06-05 MED ORDER — EPHEDRINE 5 MG/ML INJ
INTRAVENOUS | Status: AC
  Filled 2021-06-05: qty 5

## 2021-06-05 MED ORDER — ZOLPIDEM TARTRATE 5 MG PO TABS: 5.0000 mg | ORAL_TABLET | Freq: Every evening | ORAL | Status: DC | PRN

## 2021-06-05 MED ORDER — ROCURONIUM BROMIDE 10 MG/ML (PF) SYRINGE
PREFILLED_SYRINGE | INTRAVENOUS | Status: AC
  Filled 2021-06-05: qty 10

## 2021-06-05 MED ORDER — POVIDONE-IODINE 7.5 % EX SOLN
Freq: Once | CUTANEOUS | Status: DC
Start: 2021-06-05 — End: 2021-06-05
  Filled 2021-06-05: qty 118

## 2021-06-05 MED ORDER — FENOFIBRATE 160 MG PO TABS
160.0000 mg | ORAL_TABLET | Freq: Every day | ORAL | Status: DC
  Administered 2021-06-06: 160 mg via ORAL
  Filled 2021-06-05: qty 1

## 2021-06-05 MED ORDER — PHENYLEPHRINE HCL-NACL 20-0.9 MG/250ML-% IV SOLN
INTRAVENOUS | Status: DC | PRN
  Administered 2021-06-05: 50 ug/min via INTRAVENOUS
  Administered 2021-06-05: 100 ug/min via INTRAVENOUS

## 2021-06-05 MED ORDER — FENTANYL CITRATE (PF) 250 MCG/5ML IJ SOLN
INTRAMUSCULAR | Status: DC | PRN
  Administered 2021-06-05: 25 ug via INTRAVENOUS
  Administered 2021-06-05: 75 ug via INTRAVENOUS

## 2021-06-05 MED ORDER — THROMBIN (RECOMBINANT) 20000 UNITS EX SOLR
CUTANEOUS | Status: AC
  Filled 2021-06-05: qty 20000

## 2021-06-05 MED ORDER — FENTANYL CITRATE (PF) 100 MCG/2ML IJ SOLN
25.0000 ug | INTRAMUSCULAR | Status: DC | PRN
  Administered 2021-06-05 (×2): 25 ug via INTRAVENOUS

## 2021-06-05 MED ORDER — PHENYLEPHRINE 40 MCG/ML (10ML) SYRINGE FOR IV PUSH (FOR BLOOD PRESSURE SUPPORT)
PREFILLED_SYRINGE | INTRAVENOUS | Status: DC | PRN
  Administered 2021-06-05 (×2): 80 ug via INTRAVENOUS

## 2021-06-05 MED ORDER — ALBUTEROL SULFATE (2.5 MG/3ML) 0.083% IN NEBU: 2.5000 mg | INHALATION_SOLUTION | Freq: Four times a day (QID) | RESPIRATORY_TRACT | Status: DC | PRN

## 2021-06-05 MED ORDER — ACETAMINOPHEN 325 MG PO TABS: 650.0000 mg | ORAL_TABLET | ORAL | Status: DC | PRN

## 2021-06-05 MED ORDER — PROPOFOL 10 MG/ML IV BOLUS
INTRAVENOUS | Status: DC | PRN
  Administered 2021-06-05: 110 mg via INTRAVENOUS

## 2021-06-05 MED ORDER — SERTRALINE HCL 50 MG PO TABS
50.0000 mg | ORAL_TABLET | Freq: Every day | ORAL | Status: DC
  Administered 2021-06-05: 50 mg via ORAL
  Filled 2021-06-05: qty 1

## 2021-06-05 MED ORDER — THROMBIN 20000 UNITS EX SOLR
CUTANEOUS | Status: DC | PRN
  Administered 2021-06-05: 20000 [IU] via TOPICAL

## 2021-06-05 MED ORDER — BRIMONIDINE TARTRATE-TIMOLOL 0.2-0.5 % OP SOLN
1.0000 [drp] | Freq: Two times a day (BID) | OPHTHALMIC | Status: DC
Start: 2021-06-05 — End: 2021-06-05

## 2021-06-05 MED ORDER — SODIUM CHLORIDE 0.9 % IV SOLN: 250.0000 mL | INTRAVENOUS | Status: DC

## 2021-06-05 MED ORDER — FLEET ENEMA 7-19 GM/118ML RE ENEM: 1.0000 | ENEMA | Freq: Once | RECTAL | Status: DC | PRN

## 2021-06-05 MED ORDER — SODIUM CHLORIDE 0.9% FLUSH
3.0000 mL | Freq: Two times a day (BID) | INTRAVENOUS | Status: DC
  Administered 2021-06-05 – 2021-06-06 (×2): 3 mL via INTRAVENOUS

## 2021-06-05 MED ORDER — PHENYLEPHRINE HCL-NACL 20-0.9 MG/250ML-% IV SOLN
INTRAVENOUS | Status: AC
  Filled 2021-06-05: qty 250

## 2021-06-05 MED ORDER — BUPIVACAINE LIPOSOME 1.3 % IJ SUSP
INTRAMUSCULAR | Status: AC
  Filled 2021-06-05: qty 20

## 2021-06-05 MED ORDER — 0.9 % SODIUM CHLORIDE (POUR BTL) OPTIME
TOPICAL | Status: DC | PRN
Start: 2021-06-05 — End: 2021-06-05
  Administered 2021-06-05: 1000 mL

## 2021-06-05 MED ORDER — HYDROCODONE-ACETAMINOPHEN 5-325 MG PO TABS
1.0000 | ORAL_TABLET | ORAL | Status: DC | PRN
  Administered 2021-06-05: 1 via ORAL
  Administered 2021-06-05 – 2021-06-06 (×4): 2 via ORAL
  Filled 2021-06-05 (×3): qty 2
  Filled 2021-06-05: qty 1
  Filled 2021-06-05: qty 2

## 2021-06-05 MED ORDER — MORPHINE SULFATE (PF) 2 MG/ML IV SOLN: 1.0000 mg | INTRAVENOUS | Status: DC | PRN

## 2021-06-05 MED ORDER — PHENYLEPHRINE 40 MCG/ML (10ML) SYRINGE FOR IV PUSH (FOR BLOOD PRESSURE SUPPORT)
PREFILLED_SYRINGE | INTRAVENOUS | Status: AC
  Filled 2021-06-05: qty 10

## 2021-06-05 MED ORDER — CALCIUM POLYCARBOPHIL 625 MG PO TABS
625.0000 mg | ORAL_TABLET | Freq: Two times a day (BID) | ORAL | Status: DC
  Administered 2021-06-05 – 2021-06-06 (×2): 625 mg via ORAL
  Filled 2021-06-05 (×3): qty 1

## 2021-06-05 MED ORDER — VANCOMYCIN HCL 1250 MG/250ML IV SOLN
1250.0000 mg | INTRAVENOUS | Status: DC
  Administered 2021-06-06: 1250 mg via INTRAVENOUS
  Filled 2021-06-05: qty 250

## 2021-06-05 MED ORDER — LIDOCAINE 2% (20 MG/ML) 5 ML SYRINGE
INTRAMUSCULAR | Status: AC
  Filled 2021-06-05: qty 5

## 2021-06-05 MED ORDER — LACTATED RINGERS IV SOLN: INTRAVENOUS | Status: DC

## 2021-06-05 MED ORDER — ORAL CARE MOUTH RINSE: 15.0000 mL | Freq: Once | OROMUCOSAL | Status: AC

## 2021-06-05 MED ORDER — METOPROLOL SUCCINATE ER 25 MG PO TB24
25.0000 mg | ORAL_TABLET | Freq: Every evening | ORAL | Status: DC
  Administered 2021-06-05: 25 mg via ORAL
  Filled 2021-06-05: qty 1

## 2021-06-05 SURGICAL SUPPLY — 80 items
BAG COUNTER SPONGE SURGICOUNT (BAG) ×2 IMPLANT
BENZOIN TINCTURE PRP APPL 2/3 (GAUZE/BANDAGES/DRESSINGS) ×2 IMPLANT
BUR PRESCISION 1.7 ELITE (BURR) ×2 IMPLANT
BUR ROUND FLUTED 5 RND (BURR) ×2 IMPLANT
CAGE SABLE 10X30 9-16 8D (Cage) ×2 IMPLANT
CANNULA GRAFT BNE VG PRE-FILL (Bone Implant) IMPLANT
CARTRIDGE OIL MAESTRO DRILL (MISCELLANEOUS) ×1 IMPLANT
CNTNR URN SCR LID CUP LEK RST (MISCELLANEOUS) ×1 IMPLANT
CONT SPEC 4OZ STRL OR WHT (MISCELLANEOUS) ×1
COVER MAYO STAND STRL (DRAPES) ×4 IMPLANT
COVER SURGICAL LIGHT HANDLE (MISCELLANEOUS) ×2 IMPLANT
DIFFUSER DRILL AIR PNEUMATIC (MISCELLANEOUS) ×2 IMPLANT
DISPENSER GRAFT BNE VG (MISCELLANEOUS) IMPLANT
DISPENSER VIVIGEN BONE GRAFT (MISCELLANEOUS) ×2 IMPLANT
DRAIN CHANNEL 15F RND FF W/TCR (WOUND CARE) ×1 IMPLANT
DRAPE C-ARM 42X72 X-RAY (DRAPES) ×2 IMPLANT
DRAPE POUCH INSTRU U-SHP 10X18 (DRAPES) ×2 IMPLANT
DURAPREP 26ML APPLICATOR (WOUND CARE) ×2 IMPLANT
ELECT BLADE 4.0 EZ CLEAN MEGAD (MISCELLANEOUS) ×2
ELECT CAUTERY BLADE 6.4 (BLADE) ×2 IMPLANT
ELECT REM PT RETURN 9FT ADLT (ELECTROSURGICAL) ×2
ELECTRODE BLDE 4.0 EZ CLN MEGD (MISCELLANEOUS) ×1 IMPLANT
ELECTRODE REM PT RTRN 9FT ADLT (ELECTROSURGICAL) ×1 IMPLANT
EVACUATOR SILICONE 100CC (DRAIN) ×1 IMPLANT
FILTER STRAW FLUID ASPIR (MISCELLANEOUS) ×2 IMPLANT
GAUZE 4X4 16PLY ~~LOC~~+RFID DBL (SPONGE) ×3 IMPLANT
GAUZE SPONGE 4X4 12PLY STRL (GAUZE/BANDAGES/DRESSINGS) ×2 IMPLANT
GLOVE SRG 8 PF TXTR STRL LF DI (GLOVE) ×1 IMPLANT
GLOVE SURG ENC MOIS LTX SZ6.5 (GLOVE) ×2 IMPLANT
GLOVE SURG ENC MOIS LTX SZ8 (GLOVE) ×2 IMPLANT
GLOVE SURG UNDER POLY LF SZ7 (GLOVE) ×2 IMPLANT
GLOVE SURG UNDER POLY LF SZ8 (GLOVE) ×1
GOWN STRL REUS W/ TWL LRG LVL3 (GOWN DISPOSABLE) ×2 IMPLANT
GOWN STRL REUS W/ TWL XL LVL3 (GOWN DISPOSABLE) ×1 IMPLANT
GOWN STRL REUS W/TWL LRG LVL3 (GOWN DISPOSABLE) ×2
GOWN STRL REUS W/TWL XL LVL3 (GOWN DISPOSABLE) ×1
GRAFT BONE CANNULA VIVIGEN 3 (Bone Implant) ×10 IMPLANT
IV CATH 14GX2 1/4 (CATHETERS) ×2 IMPLANT
KIT BASIN OR (CUSTOM PROCEDURE TRAY) ×2 IMPLANT
KIT POSITION SURG JACKSON T1 (MISCELLANEOUS) ×2 IMPLANT
KIT TURNOVER KIT B (KITS) ×2 IMPLANT
MARKER SKIN DUAL TIP RULER LAB (MISCELLANEOUS) ×4 IMPLANT
NDL 18GX1X1/2 (RX/OR ONLY) (NEEDLE) ×1 IMPLANT
NDL HYPO 25GX1X1/2 BEV (NEEDLE) ×1 IMPLANT
NDL SPNL 18GX3.5 QUINCKE PK (NEEDLE) ×2 IMPLANT
NEEDLE 18GX1X1/2 (RX/OR ONLY) (NEEDLE) ×2 IMPLANT
NEEDLE 22X1 1/2 (OR ONLY) (NEEDLE) ×4 IMPLANT
NEEDLE HYPO 25GX1X1/2 BEV (NEEDLE) ×2 IMPLANT
NEEDLE SPNL 18GX3.5 QUINCKE PK (NEEDLE) ×4 IMPLANT
NS IRRIG 1000ML POUR BTL (IV SOLUTION) ×5 IMPLANT
OIL CARTRIDGE MAESTRO DRILL (MISCELLANEOUS) ×2
PACK LAMINECTOMY ORTHO (CUSTOM PROCEDURE TRAY) ×2 IMPLANT
PACK UNIVERSAL I (CUSTOM PROCEDURE TRAY) ×2 IMPLANT
PAD ARMBOARD 7.5X6 YLW CONV (MISCELLANEOUS) ×4 IMPLANT
PATTIES SURGICAL .5 X1 (DISPOSABLE) ×2 IMPLANT
PATTIES SURGICAL .5X1.5 (GAUZE/BANDAGES/DRESSINGS) ×2 IMPLANT
ROD EXPEDIUM PER BENT 65MM (Rod) ×1 IMPLANT
ROD SPINAL EXP 5.5X60 (Rod) ×1 IMPLANT
SCREW CORTICAL VIPER 7X35 (Screw) ×5 IMPLANT
SCREW SET SINGLE INNER (Screw) ×6 IMPLANT
SCREW VIPER CORT FIX 6.00X30 (Screw) ×1 IMPLANT
SCREW VIPER CORT FIX 6X35 (Screw) ×1 IMPLANT
SPONGE INTESTINAL PEANUT (DISPOSABLE) ×2 IMPLANT
SPONGE SURGIFOAM ABS GEL 100 (HEMOSTASIS) ×2 IMPLANT
SPONGE T-LAP 4X18 ~~LOC~~+RFID (SPONGE) ×4 IMPLANT
STRIP CLOSURE SKIN 1/2X4 (GAUZE/BANDAGES/DRESSINGS) ×3 IMPLANT
SUT MNCRL AB 4-0 PS2 18 (SUTURE) ×3 IMPLANT
SUT VIC AB 0 CT1 18XCR BRD 8 (SUTURE) ×1 IMPLANT
SUT VIC AB 0 CT1 8-18 (SUTURE) ×1
SUT VIC AB 1 CT1 18XCR BRD 8 (SUTURE) ×1 IMPLANT
SUT VIC AB 1 CT1 8-18 (SUTURE) ×1
SUT VIC AB 2-0 CT2 18 VCP726D (SUTURE) ×3 IMPLANT
SYR 20ML LL LF (SYRINGE) ×4 IMPLANT
SYR BULB IRRIG 60ML STRL (SYRINGE) ×2 IMPLANT
SYR CONTROL 10ML LL (SYRINGE) ×4 IMPLANT
TAP EXPEDIUM DL 5.0 (INSTRUMENTS) ×1 IMPLANT
TAP EXPEDIUM DL 6.0 (INSTRUMENTS) ×1 IMPLANT
TRAY FOLEY MTR SLVR 16FR STAT (SET/KITS/TRAYS/PACK) ×2 IMPLANT
WATER STERILE IRR 1000ML POUR (IV SOLUTION) ×2 IMPLANT
YANKAUER SUCT BULB TIP NO VENT (SUCTIONS) ×2 IMPLANT

## 2021-06-05 NOTE — Op Note (Signed)
PATIENT NAME: Gavin Maxwell  ? ?MEDICAL RECORD NO.:   [id number]  ?  ?DATE OF BIRTH: September 13, 1945  ?  ?DATE OF PROCEDURE: 06/05/2021  ? ?  ?                            OPERATIVE REPORT ?  ?  ?PREOPERATIVE DIAGNOSES: ?1. Severe spinal stenosis L3-4, L4-5 (M57.846) ?2. Bilateral lumbar radiculopathy (M54.16) ?3. 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  ?POSTOPERATIVE DIAGNOSES: ?1. Severe spinal stenosis L3-4, L4-5 (N62.952) ?2. Bilateral lumbar radiculopathy (M54.16) ?3. 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  ?PROCEDURES: ?1. Lumbar decompression, L3-4, L4-5, including bilateral partial facetectomy, and bilateral lumbar decompression ?2. Right-sided L3-4, L4-5 transforaminal lumbar interbody fusion. ?3. Left-sided L3-4, L4-5 posterolateral fusion. ?4. Insertion of interbody device x 2 (Globus expandable intervertebral spacers). ?5. Placement of segmental posterior instrumentation L3, L4, L5, bilaterally. ?6. Use of local autograft. ?7. Use of morselized allograft - ViviGen. ?8. Intraoperative use of fluoroscopy. ?  ?SURGEON:  Phylliss Bob, MD. ?  ?ASSISTANT:  Pricilla Holm, PA-C. ?  ?ANESTHESIA:  General endotracheal anesthesia. ?  ?COMPLICATIONS:  None. ?  ?DISPOSITION:  Stable. ?  ?ESTIMATED BLOOD LOSS:  450cc with 150cc retransfused ?  ?INDICATIONS FOR SURGERY:  Briefly, Gavin Maxwell is a pleasant 76 y.o. -year-old patient who did present to me with severe and ongoing pain in the bilateral legs. I did feel that the symptoms were secondary to the findings noted above.   ?The patient failed conservative care and did wish to proceed with the procedure  ?noted above.  ?  ?OPERATIVE DETAILS:  On 06/05/2021, the patient was brought to ?surgery and general endotracheal anesthesia was administered.  The ?patient was placed prone on a well-padded flat Jackson bed with a spinal ?frame.  Antibiotics were given and a time-out procedure was performed. ?The back was prepped and draped in the usual fashion.  A midline ?incision  was made overlying the L3-4 and L4-5 intervertebral spaces.  The fascia ?was incised at the midline.  The paraspinal musculature was bluntly ?swept laterally.  Anatomic landmarks for the pedicles were exposed. ?Using fluoroscopy, I did cannulate the L3, L4, and L5 pedicles bilaterally, ?using a medial to lateral cortical trajectory technique.  On the left ?side, the posterolateral gutter and facet joints at L3-4 and L4-5 were ?decorticated and 7 mm screws of the appropriate length were placed at L3, L4, and L5 pedicles and a 65-mm rod was placed ?and distraction was applied across the rod at each intervertebral level.  On the ?right side, the cannulated pedicle holes were filled with bone wax.  I ?then proceeded with the decompressive aspect of the procedure.     ?Starting at L4-5, I did perform a laminotomy and a full facetectomy on the right.  I was able to thoroughly and entirely decompress the L4-5 intervertebral space bilaterally, removing facet hypertrophy and ligamentum flavum hypertrophy.  At this point, with an assistant holding medial retraction of the traversing right L5 nerve, I did perform a thorough and complete L4-5 intervertebral discectomy.  The intervertebral space was then liberally packed with autograft from the decompression, as well as allograft in the form of ViviGen, as was the appropriately sized intervertebral spacer.  The spacer was expanded to approximately 12 mm in height.  Distraction was then released on the contralateral left side.  I then turned my attention to the L3-4 level.  Once again, it was clearly evident that there was  stenosis at the L3-4 level.  The stenosis was thoroughly and adequately decompressed by performing a bilateral partial facetectomy.  Of note, the stenosis on the side was very significant, and there was noted to be a very large facet cyst, which did envelop the posterior, lateral, and ventral aspect of the dura.  Removal of the cyst was extremely meticulous and  time-consuming, however, I was able to safely remove the entire cyst, and entirely decompress the spinal canal at this level. With an assistant holding medial retraction of the traversing right L4 nerve, I did perform an annulotomy at the ?posterolateral aspect of the L3-4 intervertebral space.  I then used a ?series of curettes and pituitary rongeurs to perform a thorough and ?complete intervertebral diskectomy.  The intervertebral space was then ?liberally packed with autograft as well as allograft in the form of ?ViviGen, as was the appropriate-sized intervertebral spacer.  The spacer ?was then tamped into position in the usual fashion, and was expanded to approximately 11.4 mm in height.  I was very pleased ?with the press-fit of the spacer.  I then placed 7 mm screws on the ?right at L3, L4, and L5.  A 60-mm rod was then placed and caps were placed. The distraction was then released on the contralateral left side.  All ?6 caps were then locked.  The wound was copiously irrigated with a total ?of approximately 3 L prior to placing the bone graft.  Additional ?autograft and allograft were then packed into the posterolateral gutter ?on the right side to help aid in the success of the fusion.  The wound was  ?explored for any undue bleeding and ?there was no substantial bleeding encountered.  Gel-Foam was placed over ?the laminectomy site.  The wound was then closed in layers using #1 ?Vicryl followed by 2-0 Vicryl, followed by 4-0 Monocryl.  Benzoin and ?Steri-Strips were applied followed by sterile dressing.  A #15 deep Blake drain was placed, deep to the fascia prior to closure. ?  ?  ?Of note, Pricilla Holm was my assistant throughout surgery, and did aid in retraction, suctioning, placement of the hardware, the decompression and closure. ?  ?  ?Phylliss Bob, MD  ?

## 2021-06-05 NOTE — Transfer of Care (Signed)
Immediate Anesthesia Transfer of Care Note ? ?Patient: Gavin Maxwell ? ?Procedure(s) Performed: RIGHT-SIDED LUMBAR 3- LUMBAR 4, LUMBAR 4- LUMBAR 5 TRANSFORAMINAL LUMBAR INTERBODY FUSION AND DECOMPRESSION WITH INSTRUMENTATION AND ALLOGRAFT (Right: Spine Lumbar) ? ?Patient Location: PACU ? ?Anesthesia Type:General ? ?Level of Consciousness: awake, alert , oriented and patient cooperative ? ?Airway & Oxygen Therapy: Patient Spontanous Breathing and Patient connected to face mask oxygen ? ?Post-op Assessment: Report given to RN, Post -op Vital signs reviewed and stable and Patient moving all extremities X 4 ? ?Post vital signs: Reviewed and stable; hypotension in PACU -- plan to treat with albumin per Dr Marcie Bal. ? ?Last Vitals:  ?Vitals Value Taken Time  ?BP 78/57 06/05/21 1501  ?Temp    ?Pulse 86 06/05/21 1502  ?Resp 17 06/05/21 1502  ?SpO2 100 % 06/05/21 1502  ?Vitals shown include unvalidated device data. ? ?Last Pain:  ?Vitals:  ? 06/05/21 0706  ?TempSrc:   ?PainSc: 5   ?   ? ?Patients Stated Pain Goal: 2 (06/05/21 0706) ? ?Complications: No notable events documented. ?

## 2021-06-05 NOTE — Progress Notes (Signed)
Pharmacy Antibiotic Note ? ?Gavin Maxwell is a 76 y.o. male with sever spinal stenosis, admitted on 06/05/2021 for surgery, Pharmacy has been consulted for Vancomycin dosing for surgical prophylaxis.   S/p lumbar decompression, fusion.   ?Closed system drain/midline back bulb noted.  ? Has history of CKD stage III.  SCr is 1.60 ?Pre-op Vancomycin 1500 mg IV given @ 07:03 this AM.  ? ?Plan: ?Vancomycin 1250 mg IV every 24 hours, starting tomorrow @ 0700 AM.  ?Monitor clinical progress, renal function. Check steady state vancomycin peak and trough per protocol if needed.   ? ?Height: 5\' 11"  (180.3 cm) ?Weight: 106.1 kg (234 lb) ?IBW/kg (Calculated) : 75.3 ? ?Temp (24hrs), Avg:98 ?F (36.7 ?C), Min:97.6 ?F (36.4 ?C), Max:98.2 ?F (36.8 ?C) ? ?Recent Labs  ?Lab 06/03/21 ?1028  ?WBC 6.5  ?CREATININE 1.60*  ?  ?Estimated Creatinine Clearance: 47.1 mL/min (A) (by C-G formula based on SCr of 1.6 mg/dL (H)).   ? ?Allergies  ?Allergen Reactions  ? Augmentin [Amoxicillin-Pot Clavulanate] Nausea Only  ?  Did it involve swelling of the face/tongue/throat, SOB, or low BP? No ?Did it involve sudden or severe rash/hives, skin peeling, or any reaction on the inside of your mouth or nose? No ?Did you need to seek medical attention at a hospital or doctor's office? No ?When did it last happen? More than 10 years ?If all above answers are "NO", may proceed with cephalosporin use. ?  ? Celebrex [Celecoxib] Nausea Only  ? Oxycodone Other (See Comments)  ?  Confusion, makes him "crazy"  ? ? ?Thank you for allowing pharmacy to be a part of this patient?s care. ? ?Nicole Cella, RPh ?Clinical Pharmacist ?[phone] ?06/05/2021 5:57 PM ? ?Please check AMION for all [facility] phone numbers ?After 10:00 PM, call [place] [phone] ? ?

## 2021-06-05 NOTE — H&P (Signed)
? ? ? ?PREOPERATIVE H&P ? ?Chief Complaint: R > L leg pain ? ?HPI: ?Gavin Maxwell is a 76 y.o. male who presents with ongoing pain in the bilateral legs ? ?MRI reveals severe stenosis and instability at L3/4 and L4/5 ? ?Patient has failed multiple forms of conservative care and continues to have pain (see office notes for additional details regarding the patient's full course of treatment) ? ?Past Medical History:  ?Diagnosis Date  ? Anxiety   ? Cancer [facility])   ? non hodgins  lymphoma   2004 in remission  Left arm  wears a brace there is a bone broken  ? CKD (chronic kidney disease) stage 3, GFR 30-59 ml/min (HCC)   ? Depression   ? Dysrhythmia   ? SVT  ? GERD (gastroesophageal reflux disease)   ? History of hiatal hernia   ? Hypertension   ? Pneumonia   ? Primary localized osteoarthritis of knee 09/26/2014  ? Primary localized osteoarthrosis, shoulder region, rotator cuff arthropathy 10/04/2013  ? Sleep apnea   ? cpap  ? ?Past Surgical History:  ?Procedure Laterality Date  ? CARDIAC CATHETERIZATION    ? 20 yrs. ago  ? CHOLECYSTECTOMY    ? EYE SURGERY Left   ? pt states he was "seeing double" and they did surgery to fix it  ? NASAL SINUS SURGERY    ? x2  ? REVERSE SHOULDER ARTHROPLASTY Right 10/04/2013  ? Procedure: REVERSE SHOULDER ARTHROPLASTY;  Surgeon: Johnny Bridge, MD;  Location: [place];  Service: Orthopedics;  Laterality: Right;  ? SHOULDER ACROMIOPLASTY Left   ? x 5 shoulder surgeries  ? SVT ABLATION N/A 03/07/2019  ? Procedure: SVT ABLATION;  Surgeon: Constance Haw, MD;  Location: [place] [facility];  Service: Cardiovascular;  Laterality: N/A;  ? TOTAL HIP ARTHROPLASTY Right 07/26/2019  ? Procedure: TOTAL HIP ARTHROPLASTY;  Surgeon: Marchia Bond, MD;  Location: [facility];  Service: Orthopedics;  Laterality: Right;  ? TOTAL HIP ARTHROPLASTY Left 1998  ? TOTAL KNEE ARTHROPLASTY Right 09/26/2014  ? Procedure: RIGHT TOTAL KNEE ARTHROPLASTY;  Surgeon: Marchia Bond, MD;  Location: [place];  Service:  Orthopedics;  Laterality: Right;  ? VIDEO ASSISTED THORACOSCOPY (VATS) W/TALC PLEUADESIS Right 08/18/2017  ? Procedure: VIDEO ASSISTED THORACOSCOPY (VATS)POSSIBLE  W/TALC PLEUADESIS.POSSIBLE BLEBECTOMY;  Surgeon: Nestor Lewandowsky, MD;  Location: [facility];  Service: Thoracic;  Laterality: Right;  ? ?Social History  ? ?Socioeconomic History  ? Marital status: Married  ?  Spouse name: Not on file  ? Number of children: 2  ? Years of education: Not on file  ? Highest education level: Not on file  ?Occupational History  ? Not on file  ?Tobacco Use  ? Smoking status: Former  ?  Packs/day: 2.00  ?  Years: 20.00  ?  Pack years: 40.00  ?  Types: Cigarettes  ?  Quit date: 43  ?  Years since quitting: 33.1  ? Smokeless tobacco: Never  ? Tobacco comments:  ?  quit 25 years ago  ?Vaping Use  ? Vaping Use: Never used  ?Substance and Sexual Activity  ? Alcohol use: No  ? Drug use: No  ? Sexual activity: Not Currently  ?  Birth control/protection: None  ?Other Topics Concern  ? Not on file  ?Social History Narrative  ? Not on file  ? ?Social Determinants of Health  ? ?Financial Resource Strain: Not on file  ?Food Insecurity: Not on file  ?Transportation Needs: Not on file  ?Physical  Activity: Not on file  ?Stress: Not on file  ?Social Connections: Not on file  ? ?Family History  ?Problem Relation Age of Onset  ? Breast cancer Mother   ? Hypertension Mother   ? Rheum arthritis Father   ? Cancer Father   ? Lung cancer Brother   ? Stroke Paternal Grandfather   ? ?Allergies  ?Allergen Reactions  ? Augmentin [Amoxicillin-Pot Clavulanate] Nausea Only  ?  Did it involve swelling of the face/tongue/throat, SOB, or low BP? No ?Did it involve sudden or severe rash/hives, skin peeling, or any reaction on the inside of your mouth or nose? No ?Did you need to seek medical attention at a hospital or doctor's office? No ?When did it last happen? More than 10 years ?If all above answers are "NO", may proceed with cephalosporin use. ?  ? Celebrex  [Celecoxib] Nausea Only  ? Oxycodone Other (See Comments)  ?  Confusion, makes him "crazy"  ? ?Prior to Admission medications   ?Medication Sig Start Date End Date Taking? Authorizing Provider  ?acetaminophen (TYLENOL) 500 MG tablet Take 1,000 mg by mouth 2 (two) times daily as needed for moderate pain.   Yes [provider]  ?albuterol (PROVENTIL HFA;VENTOLIN HFA) 108 (90 Base) MCG/ACT inhaler Inhale 2 puffs into the lungs every 6 (six) hours as needed for wheezing or shortness of breath. 11/03/17  Yes Wilhelmina Mcardle, MD  ?aspirin EC 81 MG tablet Take 1 tablet (81 mg total) by mouth daily. Swallow whole. 05/13/21  Yes Minna Merritts, MD  ?atorvastatin (LIPITOR) 80 MG tablet Take 1 tablet (80 mg total) by mouth daily. 05/13/21  Yes Minna Merritts, MD  ?brimonidine-timolol (COMBIGAN) 0.2-0.5 % ophthalmic solution Place 1 drop into both eyes every 12 (twelve) hours.   Yes [provider]  ?Calcium Carb-Cholecalciferol (CALCIUM 600 + D PO) Take 1 tablet by mouth daily.   Yes [provider]  ?fenofibrate 160 MG tablet Take 160 mg by mouth daily.    Yes [provider]  ?ferrous sulfate 325 (65 FE) MG tablet Take 325 mg by mouth daily.   Yes [provider]  ?fluticasone (FLONASE) 50 MCG/ACT nasal spray Place 2 sprays into both nostrils daily.    Yes [provider]  ?fluticasone-salmeterol (ADVAIR) 100-50 MCG/ACT AEPB Inhale 1 puff into the lungs 2 (two) times daily. 05/17/21  Yes [provider]  ?gabapentin (NEURONTIN) 300 MG capsule Take 300 mg by mouth 3 (three) times daily.   Yes [provider]  ?magnesium oxide (MAG-OX) 400 MG tablet Take 400 mg by mouth 3 (three) times daily. 01/23/19  Yes [provider]  ?metoprolol succinate (TOPROL-XL) 25 MG 24 hr tablet Take 1 tablet (25 mg total) by mouth every evening. 05/13/21  Yes Minna Merritts, MD  ?Multiple Vitamin (MULTIVITAMIN WITH MINERALS) TABS tablet Take 1 tablet by mouth  daily.   Yes [provider]  ?naproxen sodium (ALEVE) 220 MG tablet Take 220 mg by mouth 2 (two) times daily.   Yes [provider]  ?pantoprazole (PROTONIX) 40 MG tablet Take 40 mg by mouth daily.    Yes [provider]  ?polycarbophil (FIBERCON) 625 MG tablet Take 625 mg by mouth 2 (two) times daily.   Yes [provider]  ?sertraline (ZOLOFT) 50 MG tablet Take 50 mg by mouth at bedtime.   Yes [provider]  ? ? ? ?All other systems have been reviewed and were otherwise negative with the exception of  those mentioned in the HPI and as above. ? ?Physical Exam: ?Vitals:  ? 06/05/21 0650  ?BP: 124/73  ?Pulse: 80  ?Resp: 18  ?Temp: 97.6 ?F (36.4 ?C)  ?SpO2: 98%  ? ? ?Body mass index is 32.64 kg/m?. ? ?General: Alert, no acute distress ?Cardiovascular: No pedal edema ?Respiratory: No cyanosis, no use of accessory musculature ?Skin: No lesions in the area of chief complaint ?Neurologic: Sensation intact distally ?Psychiatric: Patient is competent for consent with normal mood and affect ?Lymphatic: No axillary or cervical lymphadenopathy ? ? 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 ?Plan for Procedure(s): ?RIGHT-SIDED LUMBAR 3- LUMBAR 4, LUMBAR 4- LUMBAR 5 TRANSFORAMINAL LUMBAR INTERBODY FUSION AND DECOMPRESSION WITH INSTRUMENTATION AND ALLOGRAFT ? ? ?Norva Karvonen, MD ?06/05/2021 ?8:16 AM  ?

## 2021-06-05 NOTE — Anesthesia Procedure Notes (Signed)
Procedure Name: Intubation ?Date/Time: 06/05/2021 8:50 AM ?Performed by: Michele Rockers, CRNA ?Pre-anesthesia Checklist: Patient identified, Patient being monitored, Timeout performed, Emergency Drugs available and Suction available ?Patient Re-evaluated:Patient Re-evaluated prior to induction ?Oxygen Delivery Method: Circle system utilized ?Preoxygenation: Pre-oxygenation with 100% oxygen ?Induction Type: IV induction ?Ventilation: Oral airway inserted - appropriate to patient size and Two handed mask ventilation required ?Laryngoscope Size: Sabra Heck and 2 ?Grade View: Grade I ?Tube type: Oral ?Tube size: 8.0 mm ?Number of attempts: 1 ?Airway Equipment and Method: Stylet ?Placement Confirmation: ETT inserted through vocal cords under direct vision, positive ETCO2 and breath sounds checked- equal and bilateral ?Secured at: 23 cm ?Tube secured with: Tape ?Dental Injury: Teeth and Oropharynx as per pre-operative assessment  ? ? ? ? ?

## 2021-06-06 ENCOUNTER — Encounter (HOSPITAL_COMMUNITY): Payer: Self-pay | Admitting: Orthopedic Surgery

## 2021-06-06 MED ORDER — HYDROCODONE-ACETAMINOPHEN 5-325 MG PO TABS: 1.0000 | ORAL_TABLET | ORAL | 0 refills | Status: DC | PRN

## 2021-06-06 MED ORDER — METHOCARBAMOL 500 MG PO TABS: 500.0000 mg | ORAL_TABLET | Freq: Four times a day (QID) | ORAL | 2 refills | Status: DC | PRN

## 2021-06-06 MED ORDER — VANCOMYCIN HCL IN DEXTROSE 1-5 GM/200ML-% IV SOLN: 1000.0000 mg | INTRAVENOUS | Status: DC

## 2021-06-06 MED FILL — Thrombin (Recombinant) For Soln 20000 Unit: CUTANEOUS | Qty: 1 | Status: AC

## 2021-06-06 NOTE — Progress Notes (Signed)
? ? ?  Patient doing well  ?Denies right or left leg pain ?Reports minimal back discomfort ?Patient has been ambulating ? ? ?Physical Exam: ?Vitals:  ? 06/05/21 2306 06/06/21 0410  ?BP: 113/63 133/74  ?Pulse: 75 73  ?Resp: 18 18  ?Temp: 98.1 ?F (36.7 ?C) 98.5 ?F (36.9 ?C)  ?SpO2: 98% 100%  ? ? ?Dressing in place ?NVI ? ?Drain output: 80cc/4 hours ? ?POD #1 s/p L3-L5 decompression and fusion, doing very well ? ?- up with PT/OT, encourage ambulation ?- Percocet for pain, Robaxin for muscle spasms ?- likely d/c home today with f/u in 2 weeks, depending on PT/OT evaluation today ?- We will maintain drain for now ?

## 2021-06-06 NOTE — Evaluation (Signed)
Physical Therapy Evaluation ?Patient Details ?Name: Gavin Maxwell ?MRN: [id number] ?DOB: Jun 08, 1945 ?Today's Date: 06/06/2021 ? ?History of Present Illness ? Patient is a 75 yo male admitted on 06/05/21 for lumbar decompression and fusion of L3 & L4. PMH includes: CKD stage III  ?Clinical Impression ? Patient admitted following above procedure. Patient presents with generalized weakness, decreased activity tolerance, and impaired balance. Educated patient on back precautions, brace wear, and progressive walking program, patient verbalized and demonstrated understanding. Patient functioning at supervision level for ambulation with RW for support. Patient with limited motion in L shoulder due to chronic humerus fx but will have wife available to assist as needed. Patient will benefit from skilled PT services during acute stay to address listed deficits. Recommend HHPT at discharge to maximize functional independence in the home.  ?   ? ?Recommendations for follow up therapy are one component of a multi-disciplinary discharge planning process, led by the attending physician.  Recommendations may be updated based on patient status, additional functional criteria and insurance authorization. ? ?Follow Up Recommendations Home health PT ? ?  ?Assistance Recommended at Discharge Intermittent Supervision/Assistance  ?Patient can return home with the following ?   ? ?  ?Equipment Recommendations None recommended by PT  ?Recommendations for Other Services ?    ?  ?Functional Status Assessment Patient has had a recent decline in their functional status and demonstrates the ability to make significant improvements in function in a reasonable and predictable amount of time.  ? ?  ?Precautions / Restrictions Precautions ?Precautions: Back;Fall ?Precaution Booklet Issued: Yes (comment) ?Precaution Comments: Able to list all precautions without prompting ?Required Braces or Orthoses: Spinal Brace ?Spinal Brace: Thoracolumbosacral  orthotic;Applied in sitting position;Applied in standing position ?Restrictions ?Weight Bearing Restrictions: No  ? ?  ? ?Mobility ? Bed Mobility ?  ?  ?  ?  ?  ?  ?  ?General bed mobility comments: Up at EOB upon arrival ?  ? ?Transfers ?Overall transfer level: Needs assistance ?Equipment used: Rolling Tristy Udovich (2 wheels) ?Transfers: Sit to/from Stand ?Sit to Stand: Supervision ?  ?  ?  ?  ?  ?General transfer comment: able to adhere to precautions to complete, no physical assist provided ?  ? ?Ambulation/Gait ?Ambulation/Gait assistance: Supervision ?Gait Distance (Feet): 250 Feet ?Assistive device: Rolling Isao Seltzer (2 wheels) ?Gait Pattern/deviations: Step-through pattern, Decreased stride length, Trunk flexed ?Gait velocity: decreased ?Gait velocity interpretation: 1.31 - 2.62 ft/sec, indicative of limited community ambulator ?  ?General Gait Details: supervision for safety. One instance of patient requesting to have therapist hand placed on him during turning due to feeling unsteady but not necessary to stabilize. ? ?Stairs ?  ?  ?  ?  ?  ? ?Wheelchair Mobility ?  ? ?Modified Rankin (Stroke Patients Only) ?  ? ?  ? ?Balance Overall balance assessment: Mild deficits observed, not formally tested ?  ?  ?  ?  ?  ?  ?  ?  ?  ?  ?  ?  ?  ?  ?  ?  ?  ?  ?   ? ? ? ?Pertinent Vitals/Pain Pain Assessment ?Pain Assessment: Faces ?Faces Pain Scale: Hurts even more ?Pain Location: Back ?Pain Descriptors / Indicators: Grimacing, Discomfort, Operative site guarding ?Pain Intervention(s): Limited activity within patient's tolerance, Monitored during session  ? ? ?Home Living Family/patient expects to be discharged to:: Private residence ?Living Arrangements: Spouse/significant other;Children ?Available Help at Discharge: Family ?Type of Home: House ?Home Access: Other (comment) (Threshold  to enter) ?  ?  ?  ?Home Layout: One level ?Home Equipment: Conservation officer, nature (2 wheels);Cane - single point;BSC/3in1;Shower seat - built in;Hand  held shower head;Adaptive equipment;Toilet riser ?   ?  ?Prior Function Prior Level of Function : Independent/Modified Independent;Driving ?  ?  ?  ?  ?  ?  ?Mobility Comments: Patient walking with a cane at baseline ?ADLs Comments: Independent ?  ? ? ?Hand Dominance  ? Dominant Hand: Right ? ?  ?Extremity/Trunk Assessment  ? Upper Extremity Assessment ?Upper Extremity Assessment: Defer to OT evaluation ?LUE Deficits / Details: Patient with multiple surgeries on humerus, previous non-Hodgkins lymphoma, has brace to aid with movement, uanble to complete shoulder flexion past 10 degrees ?LUE: Subluxation noted;Shoulder pain at rest ?LUE Sensation: WNL ?LUE Coordination: decreased fine motor;decreased gross motor ?  ? ?Lower Extremity Assessment ?Lower Extremity Assessment: Generalized weakness ?  ? ?Cervical / Trunk Assessment ?Cervical / Trunk Assessment: Normal;Back Surgery  ?Communication  ? Communication: No difficulties  ?Cognition Arousal/Alertness: Awake/alert ?Behavior During Therapy: [facility] for tasks assessed/performed ?Overall Cognitive Status: Within Functional Limits for tasks assessed ?  ?  ?  ?  ?  ?  ?  ?  ?  ?  ?  ?  ?  ?  ?  ?  ?  ?  ?  ? ?  ?General Comments   ? ?  ?Exercises    ? ?Assessment/Plan  ?  ?PT Assessment Patient needs continued PT services  ?PT Problem List Decreased strength;Decreased activity tolerance;Decreased balance;Decreased mobility ? ?   ?  ?PT Treatment Interventions DME instruction;Gait training;Functional mobility training;Therapeutic activities;Stair training;Therapeutic exercise;Balance training;Patient/family education   ? ?PT Goals (Current goals can be found in the Care Plan section)  ?Acute Rehab PT Goals ?Patient Stated Goal: to get stronger ?PT Goal Formulation: With patient ?Time For Goal Achievement: 06/20/21 ?Potential to Achieve Goals: Good ? ?  ?Frequency Min 5X/week ?  ? ? ?Co-evaluation   ?  ?  ?  ?  ? ? ?  ?AM-PAC PT "6 Clicks" Mobility  ?Outcome Measure Help  needed turning from your back to your side while in a flat bed without using bedrails?: A Little ?Help needed moving from lying on your back to sitting on the side of a flat bed without using bedrails?: A Little ?Help needed moving to and from a bed to a chair (including a wheelchair)?: A Little ?Help needed standing up from a chair using your arms (e.g., wheelchair or bedside chair)?: A Little ?Help needed to walk in hospital room?: A Little ?Help needed climbing 3-5 steps with a railing? : A Little ?6 Click Score: 18 ? ?  ?End of Session Equipment Utilized During Treatment: Back brace ?Activity Tolerance: Patient tolerated treatment well ?Patient left: in bed;with call bell/phone within reach ?Nurse Communication: Mobility status ?PT Visit Diagnosis: Unsteadiness on feet (R26.81);Muscle weakness (generalized) (M62.81) ?  ? ?Time: 2683-4196 ?PT Time Calculation (min) (ACUTE ONLY): 29 min ? ? ?Charges:   PT Evaluation ?$PT Eval Low Complexity: 1 Low ?PT Treatments ?$Gait Training: 8-22 mins ?  ?   ? ? ?Juletta Berhe A. Gilford Rile, PT, DPT ?Acute Rehabilitation Services ?Pager [phone] ?Office [phone] ? ? ?Shadman Tozzi A Donesha Wallander ?06/06/2021, 9:26 AM ? ?

## 2021-06-06 NOTE — Plan of Care (Signed)
Pt doing well. Pt given D/C instructions with verbal understanding. Rx's were sent to the pharmacy by MD. Pt's incision is clean and dry with no sign of infection. Pt's IV and JP drain were removed prior to D/C. Home Health was arranged by [facility] per MD order. Pt D/C'd home via wheelchair per MD order. Pt is stable @ D/C and has no other needs at this time. Holli Humbles, RN  ?

## 2021-06-06 NOTE — Progress Notes (Signed)
Pharmacy Antibiotic Note ? ?Gavin Maxwell is a 76 y.o. male with sever spinal stenosis, admitted on 06/05/2021 for surgery, Pharmacy has been consulted for Vancomycin dosing for surgical prophylaxis.   S/p lumbar decompression, fusion.   ?Closed system drain/midline back bulb noted.  ? ?3/2 Vancomycin 1000mg  Q 24 hr ?Scr used: 1.6 mg/dL ?Weight: 106.1 kg ?Vd coeff: 0.5 L/kg ?Est AUC: 496 ? ? ?Plan: ?Reduce Vancomycin to 1000 mg IV every 24 h  ?Monitor clinical progress, renal function ?Stop vancomycin when drain is removed  ? ? ? ?Height: 5\' 11"  (180.3 cm) ?Weight: 106.1 kg (234 lb) ?IBW/kg (Calculated) : 75.3 ? ?Temp (24hrs), Avg:98.2 ?F (36.8 ?C), Min:97.9 ?F (36.6 ?C), Max:98.5 ?F (36.9 ?C) ? ?Recent Labs  ?Lab 06/03/21 ?1028  ?WBC 6.5  ?CREATININE 1.60*  ? ?  ?Estimated Creatinine Clearance: 47.1 mL/min (A) (by C-G formula based on SCr of 1.6 mg/dL (H)).   ? ?Allergies  ?Allergen Reactions  ? Augmentin [Amoxicillin-Pot Clavulanate] Nausea Only  ?  Did it involve swelling of the face/tongue/throat, SOB, or low BP? No ?Did it involve sudden or severe rash/hives, skin peeling, or any reaction on the inside of your mouth or nose? No ?Did you need to seek medical attention at a hospital or doctor's office? No ?When did it last happen? More than 10 years ?If all above answers are "NO", may proceed with cephalosporin use. ?  ? Celebrex [Celecoxib] Nausea Only  ? Oxycodone Other (See Comments)  ?  Confusion, makes him "crazy"  ? ? ?Thank you for allowing pharmacy to be a part of this patient?s care. ? ?Benetta Spar, PharmD, BCPS, BCCP ?Clinical Pharmacist ? ?Please check AMION for all [place] phone numbers ?After 10:00 PM, call [place] [phone] ? ?

## 2021-06-06 NOTE — Anesthesia Postprocedure Evaluation (Signed)
Anesthesia Post Note ? ?Patient: Gavin Maxwell ? ?Procedure(s) Performed: RIGHT-SIDED LUMBAR 3- LUMBAR 4, LUMBAR 4- LUMBAR 5 TRANSFORAMINAL LUMBAR INTERBODY FUSION AND DECOMPRESSION WITH INSTRUMENTATION AND ALLOGRAFT (Right: Spine Lumbar) ? ?  ? ?Patient location during evaluation: PACU ?Anesthesia Type: General ?Level of consciousness: awake and alert ?Pain management: pain level controlled ?Vital Signs Assessment: post-procedure vital signs reviewed and stable ?Respiratory status: spontaneous breathing, nonlabored ventilation, respiratory function stable and patient connected to nasal cannula oxygen ?Cardiovascular status: blood pressure returned to baseline and stable ?Postop Assessment: no apparent nausea or vomiting ?Anesthetic complications: no ? ? ?No notable events documented. ? ?Last Vitals:  ?Vitals:  ? 06/06/21 0410 06/06/21 0759  ?BP: 133/74 122/63  ?Pulse: 73 77  ?Resp: 18 18  ?Temp: 36.9 ?C 36.8 ?C  ?SpO2: 100% 96%  ?  ?Last Pain:  ?Vitals:  ? 06/06/21 0759  ?TempSrc: Oral  ?PainSc:   ? ? ?  ?  ?  ?  ?  ?  ? ?[name] ? ? ? ? ?

## 2021-06-06 NOTE — TOC Progression Note (Signed)
Transition of Care (TOC) - Progression Note  ? ? ?Patient Details  ?Name: CALEY VOLKERT ?MRN: [id number] ?Date of Birth: [birth year] ? ?Transition of Care (TOC) CM/SW Contact  ?Marilu Favre, RN ?Phone Number: ?06/06/2021, 10:18 AM ? ?Clinical Narrative:    ?Patient has had [facility] in past  ?[facility] with [facility] has accepted referral for home health services.  ? ?3C staff will provide any needed DME  ? ? ?Transition of Care (TOC) Screening Note ? ? ?Patient Details  ?Name: TOU HAYNER ?Date of Birth: 13-Jan-1946 ? ? ?Transition of Care Department [facility]) has reviewed patient and no TOC needs have been identified at this time. We will continue to monitor patient advancement through interdisciplinary progression rounds. If new patient transition needs arise, please place a TOC consult. ?  ? ?  ?  ? ?Expected Discharge Plan and Services ?  ?  ?  ?  ?  ?Expected Discharge Date: 06/06/21               ?  ?  ?  ?  ?  ?  ?  ?  ?  ?  ? ? ?Social Determinants of Health (SDOH) Interventions ?  ? ?Readmission Risk Interventions ?No flowsheet data found. ? ?

## 2021-06-06 NOTE — Evaluation (Signed)
Occupational Therapy Evaluation Patient Details Name: Gavin Maxwell MRN: [id number] DOB: [birth year] Today's Date: 06/06/2021   History of Present Illness Patient is a 75 yo male admitted on 06/05/21 for lumbar decompression and fusion of L3 & L4. PMH includes: CKD stage III   Clinical Impression   Prior to this admission, patient was living with his wife and able to complete all ADLs and IADLs independently. Patient endorses still driving. Currently, patient requiring min A in order to complete lower body dressing and toilet transfers. OT spending extensive amount of time with patient going over transfers, dressing, showering and other ADLs in order to promote safe discharge home. Patient is well versed with adaptive equipment and DME due to multiple previous surgeries, and demonstrates excellent adherence to back precautions in session. OT recommending HHOT at discharge to increase overall level of independence. Therapy will continue to follow acutely to address problem list outlined below.      Recommendations for follow up therapy are one component of a multi-disciplinary discharge planning process, led by the attending physician.  Recommendations may be updated based on patient status, additional functional criteria and insurance authorization.   Follow Up Recommendations  Home health OT    Assistance Recommended at Discharge Intermittent Supervision/Assistance  Patient can return home with the following A little help with walking and/or transfers;A lot of help with bathing/dressing/bathroom;Assistance with cooking/housework;Assist for transportation;Help with stairs or ramp for entrance    Functional Status Assessment  Patient has had a recent decline in their functional status and demonstrates the ability to make significant improvements in function in a reasonable and predictable amount of time.  Equipment Recommendations  None recommended by OT (Patient has all DME needed)     Recommendations for Other Services       Precautions / Restrictions Precautions Precautions: Back;Fall Precaution Booklet Issued: Yes (comment) Precaution Comments: Able to list all precautions without prompting Required Braces or Orthoses: Spinal Brace Spinal Brace: Thoracolumbosacral orthotic;Applied in sitting position;Applied in standing position Restrictions Weight Bearing Restrictions: No      Mobility Bed Mobility               General bed mobility comments: Up at EOB upon arrival    Transfers Overall transfer level: Needs assistance Equipment used: Rolling walker (2 wheels) Transfers: Sit to/from Stand Sit to Stand: Supervision           General transfer comment: able to adhere to precautions to complete, no physical assist provided      Balance Overall balance assessment: Mild deficits observed, not formally tested                                         ADL either performed or assessed with clinical judgement   ADL Overall ADL's : Needs assistance/impaired Eating/Feeding: Independent   Grooming: Set up;Sitting   Upper Body Bathing: Set up;Sitting   Lower Body Bathing: Minimal assistance;Sitting/lateral leans   Upper Body Dressing : Set up;Sitting   Lower Body Dressing: Minimal assistance;Sit to/from stand Lower Body Dressing Details (indicate cue type and reason): OT threading pants for patient due to precautions, able to pull up from sit<>stand, has slip on shoes at home, wife able to assist Toilet Transfer: Min guard;Rolling walker (2 wheels)   Toileting- Clothing Manipulation and Hygiene: Minimal assistance;Sitting/lateral lean Toileting - Clothing Manipulation Details (indicate cue type and reason): Has toilet aide at  home to assist with wiping     Functional mobility during ADLs: Minimal assistance;Rolling walker (2 wheels) General ADL Comments: Patient presenting with pain, and min A to complete lower body ADLs and  toilet transfers     Vision Baseline Vision/History: 1 Wears glasses Ability to See in Adequate Light: 0 Adequate Patient Visual Report: No change from baseline       Perception     Praxis      Pertinent Vitals/Pain Pain Assessment Pain Assessment: 0-10 Pain Score: 8  Pain Location: Back Pain Descriptors / Indicators: Grimacing, Discomfort, Operative site guarding Pain Intervention(s): Limited activity within patient's tolerance, Monitored during session, Premedicated before session     Hand Dominance Right   Extremity/Trunk Assessment Upper Extremity Assessment Upper Extremity Assessment: LUE deficits/detail;Overall WFL for tasks assessed LUE Deficits / Details: Patient with multiple surgeries on humerus, previous non-Hodgkins lymphoma, has brace to aid with movement, uanble to complete shoulder flexion past 10 degrees LUE: Subluxation noted;Shoulder pain at rest LUE Sensation: WNL LUE Coordination: decreased fine motor;decreased gross motor   Lower Extremity Assessment Lower Extremity Assessment: Defer to PT evaluation;Overall [facility] for tasks assessed   Cervical / Trunk Assessment Cervical / Trunk Assessment: Normal;Back Surgery   Communication Communication Communication: No difficulties   Cognition Arousal/Alertness: Awake/alert Behavior During Therapy: WFL for tasks assessed/performed Overall Cognitive Status: Within Functional Limits for tasks assessed                                       General Comments       Exercises     Shoulder Instructions      Home Living Family/patient expects to be discharged to:: Private residence Living Arrangements: Spouse/significant other;Children Available Help at Discharge: Family Type of Home: House Home Access: Other (comment) (Threshold to enter)     Home Layout: One level     Bathroom Shower/Tub: Producer, television/film/video: Standard Bathroom Accessibility: Yes How Accessible:  Accessible via walker Home Equipment: Rolling Walker (2 wheels);Cane - single point;BSC/3in1;Shower seat - built in;Hand held shower head;Adaptive equipment;Toilet riser Adaptive Equipment: Reacher;Other (Comment) Advertising account planner)        Prior Functioning/Environment Prior Level of Function : Independent/Modified Independent;Driving             Mobility Comments: Patient walking with a cane at baseline ADLs Comments: Independent        OT Problem List: Decreased strength;Decreased activity tolerance;Impaired balance (sitting and/or standing);Pain      OT Treatment/Interventions: Self-care/ADL training;Therapeutic exercise;Energy conservation;DME and/or AE instruction;Therapeutic activities;Patient/family education;Balance training    OT Goals(Current goals can be found in the care plan section) Acute Rehab OT Goals Patient Stated Goal: to get back to my old self OT Goal Formulation: With patient Time For Goal Achievement: 06/20/21 Potential to Achieve Goals: Good  OT Frequency: Min 2X/week    Co-evaluation              AM-PAC OT "6 Clicks" Daily Activity     Outcome Measure Help from another person eating meals?: None Help from another person taking care of personal grooming?: A Little Help from another person toileting, which includes using toliet, bedpan, or urinal?: A Little Help from another person bathing (including washing, rinsing, drying)?: A Little Help from another person to put on and taking off regular upper body clothing?: A Little Help from another person to put on and taking off  regular lower body clothing?: A Little 6 Click Score: 19   End of Session Equipment Utilized During Treatment: Back brace Nurse Communication: Mobility status  Activity Tolerance: Patient tolerated treatment well Patient left: with call bell/phone within reach;in bed;Other (comment) (Handed off to PT, sitting EOB)  OT Visit Diagnosis: Unsteadiness on feet (R26.81);Other  abnormalities of gait and mobility (R26.89);Pain;Muscle weakness (generalized) (M62.81) Pain - part of body:  (Back)                Time: 7564-3329 OT Time Calculation (min): 43 min Charges:  OT General Charges $OT Visit: 1 Visit OT Evaluation $OT Eval Moderate Complexity: 1 Mod OT Treatments $Self Care/Home Management : 23-37 mins  Pollyann Glen E. Evetta Renner, OTR/L Acute Rehabilitation Services [phone]   Cherlyn Cushing 06/06/2021, 9:04 AM

## 2021-06-07 MED FILL — Heparin Sodium (Porcine) Inj 1000 Unit/ML: INTRAMUSCULAR | Qty: 30 | Status: AC

## 2021-06-07 MED FILL — Sodium Chloride IV Soln 0.9%: INTRAVENOUS | Qty: 1000 | Status: AC

## 2021-06-07 MED FILL — Sodium Chloride Irrigation Soln 0.9%: Qty: 3000 | Status: AC

## 2021-06-10 DIAGNOSIS — N1831 Chronic kidney disease, stage 3a: Secondary | ICD-10-CM | POA: Diagnosis not present

## 2021-06-10 DIAGNOSIS — I10 Essential (primary) hypertension: Secondary | ICD-10-CM | POA: Diagnosis not present

## 2021-06-10 DIAGNOSIS — M24512 Contracture, left shoulder: Secondary | ICD-10-CM | POA: Diagnosis not present

## 2021-06-10 DIAGNOSIS — Z7982 Long term (current) use of aspirin: Secondary | ICD-10-CM | POA: Diagnosis not present

## 2021-06-10 DIAGNOSIS — Z48811 Encounter for surgical aftercare following surgery on the nervous system: Secondary | ICD-10-CM | POA: Diagnosis not present

## 2021-06-10 DIAGNOSIS — D649 Anemia, unspecified: Secondary | ICD-10-CM | POA: Diagnosis not present

## 2021-06-10 DIAGNOSIS — E669 Obesity, unspecified: Secondary | ICD-10-CM | POA: Diagnosis not present

## 2021-06-10 DIAGNOSIS — E785 Hyperlipidemia, unspecified: Secondary | ICD-10-CM | POA: Diagnosis not present

## 2021-06-10 NOTE — Discharge Summary (Signed)
Patient ID: Gavin Maxwell MRN: [id number] DOB/AGE: July 17, 1945 76 y.o.  Admit date: 06/05/2021 Discharge date: 06/06/2021  Admission Diagnoses:  Principal Problem:   Radiculopathy   Discharge Diagnoses:  Same  Past Medical History:  Diagnosis Date   Anxiety    Cancer [facility])    non hodgins  lymphoma   2004 in remission  Left arm  wears a brace there is a bone broken   CKD (chronic kidney disease) stage 3, GFR 30-59 ml/min (HCC)    Depression    Dysrhythmia    SVT   GERD (gastroesophageal reflux disease)    History of hiatal hernia    Hypertension    Pneumonia    Primary localized osteoarthritis of knee 09/26/2014   Primary localized osteoarthrosis, shoulder region, rotator cuff arthropathy 10/04/2013   Sleep apnea    cpap    Surgeries: Procedure(s): RIGHT-SIDED LUMBAR 3- LUMBAR 4, LUMBAR 4- LUMBAR 5 TRANSFORAMINAL LUMBAR INTERBODY FUSION AND DECOMPRESSION WITH INSTRUMENTATION AND ALLOGRAFT on 06/05/2021   Consultants: None  Discharged Condition: Improved  Hospital Course: Gavin Maxwell is an 76 y.o. male who was admitted 06/05/2021 for operative treatment of Radiculopathy. Patient has severe unremitting pain that affects sleep, daily activities, and work/hobbies. After pre-op clearance the patient was taken to the operating room on 06/05/2021 and underwent  Procedure(s): RIGHT-SIDED LUMBAR 3- LUMBAR 4, LUMBAR 4- LUMBAR 5 TRANSFORAMINAL LUMBAR INTERBODY FUSION AND DECOMPRESSION WITH INSTRUMENTATION AND ALLOGRAFT.    Patient was given perioperative antibiotics:  Anti-infectives (From admission, onward)    Start     Dose/Rate Route Frequency Ordered Stop   06/07/21 1200  vancomycin (VANCOCIN) IVPB 1000 mg/200 mL premix  Status:  Discontinued        1,000 mg 200 mL/hr over 60 Minutes Intravenous Every 24 hours 06/06/21 0925 06/06/21 2203   06/06/21 0700  vancomycin (VANCOREADY) IVPB 1250 mg/250 mL  Status:  Discontinued        1,250 mg 166.7 mL/hr over 90 Minutes  Intravenous Every 24 hours 06/05/21 1808 06/06/21 0925   06/05/21 0630  vancomycin (VANCOREADY) IVPB 1500 mg/300 mL        1,500 mg 150 mL/hr over 120 Minutes Intravenous On call to O.R. 06/05/21 5329 06/05/21 9242        Patient was given sequential compression devices, early ambulation to prevent DVT.  Patient benefited maximally from hospital stay and there were no complications.    Recent vital signs: BP (!) 111/58 (BP Location: Right Arm)    Pulse 73    Temp 98.4 F (36.9 C) (Oral)    Resp 18    Ht '5\' 11"'$  (1.803 m)    Wt 106.1 kg    SpO2 (!) 88%    BMI 32.64 kg/m    Discharge Medications:   Allergies as of 06/06/2021       Reactions   Augmentin [amoxicillin-pot Clavulanate] Nausea Only   Did it involve swelling of the face/tongue/throat, SOB, or low BP? No Did it involve sudden or severe rash/hives, skin peeling, or any reaction on the inside of your mouth or nose? No Did you need to seek medical attention at a hospital or doctor's office? No When did it last happen? More than 10 years If all above answers are "NO", may proceed with cephalosporin use.   Celebrex [celecoxib] Nausea Only   Oxycodone Other (See Comments)   Confusion, makes him "crazy"        Medication List     TAKE these  medications    acetaminophen 500 MG tablet Commonly known as: TYLENOL Take 1,000 mg by mouth 2 (two) times daily as needed for moderate pain.   albuterol 108 (90 Base) MCG/ACT inhaler Commonly known as: VENTOLIN HFA Inhale 2 puffs into the lungs every 6 (six) hours as needed for wheezing or shortness of breath.   aspirin EC 81 MG tablet Take 1 tablet (81 mg total) by mouth daily. Swallow whole.   atorvastatin 80 MG tablet Commonly known as: LIPITOR Take 1 tablet (80 mg total) by mouth daily.   brimonidine-timolol 0.2-0.5 % ophthalmic solution Commonly known as: COMBIGAN Place 1 drop into both eyes every 12 (twelve) hours.   CALCIUM 600 + D PO Take 1 tablet by mouth daily.    fenofibrate 160 MG tablet Take 160 mg by mouth daily.   ferrous sulfate 325 (65 FE) MG tablet Take 325 mg by mouth daily.   fluticasone 50 MCG/ACT nasal spray Commonly known as: FLONASE Place 2 sprays into both nostrils daily.   fluticasone-salmeterol 100-50 MCG/ACT Aepb Commonly known as: ADVAIR Inhale 1 puff into the lungs 2 (two) times daily.   gabapentin 300 MG capsule Commonly known as: NEURONTIN Take 300 mg by mouth 3 (three) times daily.   HYDROcodone-acetaminophen 5-325 MG tablet Commonly known as: NORCO/VICODIN Take 1-2 tablets by mouth every 4 (four) hours as needed for moderate pain or severe pain ((score 4 to 6)).   magnesium oxide 400 MG tablet Commonly known as: MAG-OX Take 400 mg by mouth 3 (three) times daily.   methocarbamol 500 MG tablet Commonly known as: ROBAXIN Take 1 tablet (500 mg total) by mouth every 6 (six) hours as needed for muscle spasms.   metoprolol succinate 25 MG 24 hr tablet Commonly known as: TOPROL-XL Take 1 tablet (25 mg total) by mouth every evening.   multivitamin with minerals Tabs tablet Take 1 tablet by mouth daily.   pantoprazole 40 MG tablet Commonly known as: PROTONIX Take 40 mg by mouth daily.   polycarbophil 625 MG tablet Commonly known as: FIBERCON Take 625 mg by mouth 2 (two) times daily.   sertraline 50 MG tablet Commonly known as: ZOLOFT Take 50 mg by mouth at bedtime.        Diagnostic Studies: DG Lumbar Spine 2-3 Views  Result Date: 06/05/2021 CLINICAL DATA:  Posterior fusion L3-L5 EXAM: LUMBAR SPINE - 2-3 VIEW COMPARISON:  None. FINDINGS: Fluoroscopic images were obtained intraoperatively and submitted for post operative interpretation. Posterior fusion of L3-L5 with interbody spacers, with hardware in expected position, 2 images were obtained with 1 minute and 29 seconds of fluoroscopy time and 98.68 mGy. Please see the performing provider's procedural report for further detail. IMPRESSION: As above.  Electronically Signed   By: Yetta Glassman M.D.   On: 06/05/2021 15:01   DG Lumbar Spine 1 View  Result Date: 06/05/2021 CLINICAL DATA:  Surgical localization lumbar spine EXAM: LUMBAR SPINE - 1 VIEW COMPARISON:  11/27/2020 FINDINGS: Lowest disc space considered L5-S1. Multilevel lumbar disc degeneration. Mild anterolisthesis L3-4. Grade 1 anterolisthesis L4-5 Localizer needle in the soft tissues posterior to the L4 spinous process. Second needle posterior to the S1 spinous process. IMPRESSION: Lumbar localization in the operating room. Electronically Signed   By: Franchot Gallo M.D.   On: 06/05/2021 10:53   DG C-Arm 1-60 Min-No Report  Result Date: 06/05/2021 Fluoroscopy was utilized by the requesting physician.  No radiographic interpretation.   DG C-Arm 1-60 Min-No Report  Result Date: 06/05/2021 Fluoroscopy was  utilized by the requesting physician.  No radiographic interpretation.   DG C-Arm 1-60 Min-No Report  Result Date: 06/05/2021 Fluoroscopy was utilized by the requesting physician.  No radiographic interpretation.   DG C-Arm 1-60 Min-No Report  Result Date: 06/05/2021 Fluoroscopy was utilized by the requesting physician.  No radiographic interpretation.   DG C-Arm 1-60 Min-No Report  Result Date: 06/05/2021 Fluoroscopy was utilized by the requesting physician.  No radiographic interpretation.    Disposition: Discharge disposition: 01-Home or Self Care       Discharge Instructions     Face-to-face encounter (required for Medicare/Medicaid patients)   Complete by: As directed    I Justice Britain certify that this patient is under my care and that I, or a nurse practitioner or physician's assistant working with me, had a face-to-face encounter that meets the physician face-to-face encounter requirements with this patient on 06/06/2021. The encounter with the patient was in whole, or in part for the following medical condition(s) which is the primary reason for home health care  (List medical condition): Lumbar fusion   The encounter with the patient was in whole, or in part, for the following medical condition, which is the primary reason for home health care: Lumbar fusion   I certify that, based on my findings, the following services are medically necessary home health services: Physical therapy   Reason for Medically Necessary Home Health Services: Therapy- Instruction on Safe use of Assistive Devices for ADLs   My clinical findings support the need for the above services: Unable to leave home safely without assistance and/or assistive device   Further, I certify that my clinical findings support that this patient is homebound due to: Unable to leave home safely without assistance   Home Health   Complete by: As directed    To provide the following care/treatments:  OT PT        POD #1 s/p L3-L5 decompression and fusion, doing very well   - up with PT/OT, encourage ambulation - Percocet for pain, Robaxin for muscle spasms -Scripts for pain sent to pharmacy electronically  -D/C instructions sheet printed and in chart -D/C today  -F/U in office 2 weeks   Signed: Lennie Muckle Mitsue Peery 06/10/2021, 5:06 PM

## 2021-06-18 DIAGNOSIS — Z981 Arthrodesis status: Secondary | ICD-10-CM | POA: Diagnosis not present

## 2021-07-08 DIAGNOSIS — I10 Essential (primary) hypertension: Secondary | ICD-10-CM | POA: Diagnosis not present

## 2021-07-08 DIAGNOSIS — J441 Chronic obstructive pulmonary disease with (acute) exacerbation: Secondary | ICD-10-CM | POA: Diagnosis not present

## 2021-07-11 DIAGNOSIS — H401132 Primary open-angle glaucoma, bilateral, moderate stage: Secondary | ICD-10-CM | POA: Diagnosis not present

## 2021-07-12 DIAGNOSIS — I10 Essential (primary) hypertension: Secondary | ICD-10-CM | POA: Diagnosis not present

## 2021-07-12 DIAGNOSIS — D649 Anemia, unspecified: Secondary | ICD-10-CM | POA: Diagnosis not present

## 2021-07-12 DIAGNOSIS — E669 Obesity, unspecified: Secondary | ICD-10-CM | POA: Diagnosis not present

## 2021-07-12 DIAGNOSIS — E785 Hyperlipidemia, unspecified: Secondary | ICD-10-CM | POA: Diagnosis not present

## 2021-07-12 DIAGNOSIS — Z48811 Encounter for surgical aftercare following surgery on the nervous system: Secondary | ICD-10-CM | POA: Diagnosis not present

## 2021-07-12 DIAGNOSIS — N1831 Chronic kidney disease, stage 3a: Secondary | ICD-10-CM | POA: Diagnosis not present

## 2021-07-12 DIAGNOSIS — M24512 Contracture, left shoulder: Secondary | ICD-10-CM | POA: Diagnosis not present

## 2021-07-12 DIAGNOSIS — Z7982 Long term (current) use of aspirin: Secondary | ICD-10-CM | POA: Diagnosis not present

## 2021-07-17 DIAGNOSIS — G4733 Obstructive sleep apnea (adult) (pediatric): Secondary | ICD-10-CM | POA: Diagnosis not present

## 2021-07-22 DIAGNOSIS — Z9889 Other specified postprocedural states: Secondary | ICD-10-CM | POA: Diagnosis not present

## 2021-07-22 DIAGNOSIS — M5416 Radiculopathy, lumbar region: Secondary | ICD-10-CM | POA: Diagnosis not present

## 2021-08-02 DIAGNOSIS — H401132 Primary open-angle glaucoma, bilateral, moderate stage: Secondary | ICD-10-CM | POA: Diagnosis not present

## 2021-08-16 ENCOUNTER — Other Ambulatory Visit: Payer: Self-pay | Admitting: Internal Medicine

## 2021-08-16 DIAGNOSIS — R8290 Unspecified abnormal findings in urine: Secondary | ICD-10-CM | POA: Diagnosis not present

## 2021-08-16 DIAGNOSIS — I10 Essential (primary) hypertension: Secondary | ICD-10-CM | POA: Diagnosis not present

## 2021-08-16 DIAGNOSIS — D649 Anemia, unspecified: Secondary | ICD-10-CM | POA: Diagnosis not present

## 2021-08-16 DIAGNOSIS — M79651 Pain in right thigh: Secondary | ICD-10-CM

## 2021-08-16 DIAGNOSIS — Z79899 Other long term (current) drug therapy: Secondary | ICD-10-CM | POA: Diagnosis not present

## 2021-08-16 DIAGNOSIS — E782 Mixed hyperlipidemia: Secondary | ICD-10-CM | POA: Diagnosis not present

## 2021-08-16 DIAGNOSIS — N183 Chronic kidney disease, stage 3 unspecified: Secondary | ICD-10-CM | POA: Diagnosis not present

## 2021-08-21 ENCOUNTER — Ambulatory Visit
Admission: RE | Admit: 2021-08-21 | Discharge: 2021-08-21 | Disposition: A | Discharge status: Home / Self Care | Payer: PPO | Source: Ambulatory Visit | Attending: Internal Medicine | Admitting: Internal Medicine

## 2021-08-21 DIAGNOSIS — Z8572 Personal history of non-Hodgkin lymphomas: Secondary | ICD-10-CM | POA: Diagnosis not present

## 2021-08-21 DIAGNOSIS — M79651 Pain in right thigh: Secondary | ICD-10-CM | POA: Diagnosis not present

## 2021-09-06 DIAGNOSIS — M5416 Radiculopathy, lumbar region: Secondary | ICD-10-CM | POA: Diagnosis not present

## 2021-09-12 DIAGNOSIS — M9953 Intervertebral disc stenosis of neural canal of lumbar region: Secondary | ICD-10-CM | POA: Diagnosis not present

## 2021-09-12 DIAGNOSIS — M5116 Intervertebral disc disorders with radiculopathy, lumbar region: Secondary | ICD-10-CM | POA: Diagnosis not present

## 2021-09-18 ENCOUNTER — Other Ambulatory Visit: Payer: Self-pay | Admitting: Internal Medicine

## 2021-09-18 DIAGNOSIS — R1909 Other intra-abdominal and pelvic swelling, mass and lump: Secondary | ICD-10-CM

## 2021-09-19 DIAGNOSIS — H903 Sensorineural hearing loss, bilateral: Secondary | ICD-10-CM | POA: Diagnosis not present

## 2021-09-19 DIAGNOSIS — G4733 Obstructive sleep apnea (adult) (pediatric): Secondary | ICD-10-CM | POA: Diagnosis not present

## 2021-09-19 DIAGNOSIS — H6063 Unspecified chronic otitis externa, bilateral: Secondary | ICD-10-CM | POA: Diagnosis not present

## 2021-09-19 DIAGNOSIS — H6123 Impacted cerumen, bilateral: Secondary | ICD-10-CM | POA: Diagnosis not present

## 2021-09-24 DIAGNOSIS — M9953 Intervertebral disc stenosis of neural canal of lumbar region: Secondary | ICD-10-CM | POA: Diagnosis not present

## 2021-09-24 DIAGNOSIS — M5116 Intervertebral disc disorders with radiculopathy, lumbar region: Secondary | ICD-10-CM | POA: Diagnosis not present

## 2021-09-26 ENCOUNTER — Ambulatory Visit
Admission: RE | Admit: 2021-09-26 | Discharge: 2021-09-26 | Disposition: A | Discharge status: Home / Self Care | Payer: PPO | Source: Ambulatory Visit | Attending: Internal Medicine | Admitting: Internal Medicine

## 2021-09-26 DIAGNOSIS — Z8572 Personal history of non-Hodgkin lymphomas: Secondary | ICD-10-CM | POA: Diagnosis not present

## 2021-09-26 DIAGNOSIS — I714 Abdominal aortic aneurysm, without rupture, unspecified: Secondary | ICD-10-CM | POA: Diagnosis not present

## 2021-09-26 DIAGNOSIS — K5730 Diverticulosis of large intestine without perforation or abscess without bleeding: Secondary | ICD-10-CM | POA: Diagnosis not present

## 2021-09-26 DIAGNOSIS — K4090 Unilateral inguinal hernia, without obstruction or gangrene, not specified as recurrent: Secondary | ICD-10-CM | POA: Diagnosis not present

## 2021-09-26 DIAGNOSIS — R1909 Other intra-abdominal and pelvic swelling, mass and lump: Secondary | ICD-10-CM

## 2021-09-26 MED ORDER — IOPAMIDOL (ISOVUE-300) INJECTION 61%
80.0000 mL | Freq: Once | INTRAVENOUS | Status: AC | PRN
  Administered 2021-09-26: 80 mL via INTRAVENOUS

## 2021-09-27 DIAGNOSIS — M9953 Intervertebral disc stenosis of neural canal of lumbar region: Secondary | ICD-10-CM | POA: Diagnosis not present

## 2021-09-27 DIAGNOSIS — M5116 Intervertebral disc disorders with radiculopathy, lumbar region: Secondary | ICD-10-CM | POA: Diagnosis not present

## 2021-10-01 DIAGNOSIS — M5116 Intervertebral disc disorders with radiculopathy, lumbar region: Secondary | ICD-10-CM | POA: Diagnosis not present

## 2021-10-01 DIAGNOSIS — M9953 Intervertebral disc stenosis of neural canal of lumbar region: Secondary | ICD-10-CM | POA: Diagnosis not present

## 2021-10-03 DIAGNOSIS — M9953 Intervertebral disc stenosis of neural canal of lumbar region: Secondary | ICD-10-CM | POA: Diagnosis not present

## 2021-10-03 DIAGNOSIS — M5116 Intervertebral disc disorders with radiculopathy, lumbar region: Secondary | ICD-10-CM | POA: Diagnosis not present

## 2021-10-07 DIAGNOSIS — M5116 Intervertebral disc disorders with radiculopathy, lumbar region: Secondary | ICD-10-CM | POA: Diagnosis not present

## 2021-10-07 DIAGNOSIS — M9953 Intervertebral disc stenosis of neural canal of lumbar region: Secondary | ICD-10-CM | POA: Diagnosis not present

## 2021-10-11 DIAGNOSIS — M5116 Intervertebral disc disorders with radiculopathy, lumbar region: Secondary | ICD-10-CM | POA: Diagnosis not present

## 2021-10-11 DIAGNOSIS — M9953 Intervertebral disc stenosis of neural canal of lumbar region: Secondary | ICD-10-CM | POA: Diagnosis not present

## 2021-10-14 DIAGNOSIS — M5416 Radiculopathy, lumbar region: Secondary | ICD-10-CM | POA: Diagnosis not present

## 2021-10-14 DIAGNOSIS — M545 Low back pain, unspecified: Secondary | ICD-10-CM | POA: Diagnosis not present

## 2021-10-15 DIAGNOSIS — M5116 Intervertebral disc disorders with radiculopathy, lumbar region: Secondary | ICD-10-CM | POA: Diagnosis not present

## 2021-10-15 DIAGNOSIS — M9953 Intervertebral disc stenosis of neural canal of lumbar region: Secondary | ICD-10-CM | POA: Diagnosis not present

## 2021-10-17 DIAGNOSIS — M5116 Intervertebral disc disorders with radiculopathy, lumbar region: Secondary | ICD-10-CM | POA: Diagnosis not present

## 2021-10-17 DIAGNOSIS — M9953 Intervertebral disc stenosis of neural canal of lumbar region: Secondary | ICD-10-CM | POA: Diagnosis not present

## 2021-10-23 DIAGNOSIS — M5416 Radiculopathy, lumbar region: Secondary | ICD-10-CM | POA: Diagnosis not present

## 2021-10-29 DIAGNOSIS — M9953 Intervertebral disc stenosis of neural canal of lumbar region: Secondary | ICD-10-CM | POA: Diagnosis not present

## 2021-10-29 DIAGNOSIS — M5116 Intervertebral disc disorders with radiculopathy, lumbar region: Secondary | ICD-10-CM | POA: Diagnosis not present

## 2021-10-31 DIAGNOSIS — M5116 Intervertebral disc disorders with radiculopathy, lumbar region: Secondary | ICD-10-CM | POA: Diagnosis not present

## 2021-10-31 DIAGNOSIS — M9953 Intervertebral disc stenosis of neural canal of lumbar region: Secondary | ICD-10-CM | POA: Diagnosis not present

## 2021-11-01 DIAGNOSIS — H401132 Primary open-angle glaucoma, bilateral, moderate stage: Secondary | ICD-10-CM | POA: Diagnosis not present

## 2021-11-11 DIAGNOSIS — M4326 Fusion of spine, lumbar region: Secondary | ICD-10-CM | POA: Diagnosis not present

## 2021-11-11 DIAGNOSIS — M5416 Radiculopathy, lumbar region: Secondary | ICD-10-CM | POA: Diagnosis not present

## 2021-11-15 DIAGNOSIS — Z79899 Other long term (current) drug therapy: Secondary | ICD-10-CM | POA: Diagnosis not present

## 2021-11-15 DIAGNOSIS — Z1211 Encounter for screening for malignant neoplasm of colon: Secondary | ICD-10-CM | POA: Diagnosis not present

## 2021-11-15 DIAGNOSIS — F411 Generalized anxiety disorder: Secondary | ICD-10-CM | POA: Diagnosis not present

## 2021-11-15 DIAGNOSIS — N183 Chronic kidney disease, stage 3 unspecified: Secondary | ICD-10-CM | POA: Diagnosis not present

## 2021-11-15 DIAGNOSIS — I10 Essential (primary) hypertension: Secondary | ICD-10-CM | POA: Diagnosis not present

## 2021-11-15 DIAGNOSIS — E782 Mixed hyperlipidemia: Secondary | ICD-10-CM | POA: Diagnosis not present

## 2021-11-20 ENCOUNTER — Ambulatory Visit: Payer: PPO | Admitting: Dermatology

## 2021-11-20 DIAGNOSIS — L821 Other seborrheic keratosis: Secondary | ICD-10-CM | POA: Diagnosis not present

## 2021-11-20 DIAGNOSIS — L578 Other skin changes due to chronic exposure to nonionizing radiation: Secondary | ICD-10-CM | POA: Diagnosis not present

## 2021-11-20 DIAGNOSIS — L820 Inflamed seborrheic keratosis: Secondary | ICD-10-CM

## 2021-11-20 DIAGNOSIS — L570 Actinic keratosis: Secondary | ICD-10-CM

## 2021-11-20 NOTE — Progress Notes (Signed)
Follow-Up Visit   Subjective  Gavin Maxwell is a 76 y.o. male who presents for the following: Actinic Keratosis (6 months f/u face and scalp hx of Aks ). The patient has spots, moles and lesions to be evaluated, some may be new or changing.   The following portions of the chart were reviewed this encounter and updated as appropriate:   Tobacco  Allergies  Meds  Problems  Med Hx  Surg Hx  Fam Hx     Review of Systems:  No other skin or systemic complaints except as noted in HPI or Assessment and Plan.  Objective  Well appearing patient in no apparent distress; mood and affect are within normal limits.  A focused examination was performed including face,scalp. Relevant physical exam findings are noted in the Assessment and Plan.  scalp,face,ears x 11 (11) Erythematous thin papules/macules with gritty scale.   left leg around the knee x 15   , right forearm x 4 (19) Stuck-on, waxy, tan-brown papules--Discussed benign etiology and prognosis.    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 They are chronic with expected duration over 1 year. A portion of actinic keratoses will progress to squamous cell carcinoma of the skin. It is not possible to reliably predict which spots will progress to skin cancer and so treatment is recommended to prevent development of skin cancer.  Recommend daily broad spectrum sunscreen SPF 30+ to sun-exposed areas, reapply every 2 hours as needed.  Recommend staying in the shade or wearing long sleeves, sun glasses (UVA+UVB protection) and wide brim hats (4-inch brim around the entire circumference of the hat). Call for new or changing lesions.   Destruction of lesion - scalp,face,ears x 11 Complexity: simple   Destruction method: cryotherapy   Informed consent: discussed and consent obtained    Timeout:  patient name, date of birth, surgical site, and procedure verified Lesion destroyed using liquid nitrogen: Yes   Region frozen until ice ball extended beyond lesion: Yes   Outcome: patient tolerated procedure well with no complications   Post-procedure details: wound care instructions given    Inflamed seborrheic keratosis (19) left leg around the knee x 15   , right forearm x 4  Symptomatic, irritating, patient would like treated.   Destruction of lesion - left leg around the knee x 15   , right forearm x 4 Complexity: simple   Destruction method: cryotherapy   Informed consent: discussed and consent obtained   Timeout:  patient name, date of birth, surgical site, and procedure verified Lesion destroyed using liquid nitrogen: Yes   Region frozen until ice ball extended beyond lesion: Yes   Outcome: patient tolerated procedure well with no complications   Post-procedure details: wound care instructions given    Seborrheic Keratoses - Stuck-on, waxy, tan-brown papules and/or plaques  - Benign-appearing - Discussed benign etiology and prognosis. - Observe - Call for any changes   Actinic Damage - chronic, secondary to cumulative UV radiation exposure/sun exposure over time - diffuse scaly erythematous macules with underlying dyspigmentation - Recommend daily broad spectrum sunscreen SPF 30+ to sun-exposed areas, reapply every 2 hours as needed.  - Recommend staying in the shade or wearing long sleeves, sun glasses (UVA+UVB protection) and wide brim hats (4-inch brim around the entire circumference of the hat). - Call for new or changing lesions.  Return in about 6  months (around 05/23/2022) for Aks, ISKs.  IMarye Round, CMA, am acting as scribe for Sarina Ser, MD .  Documentation: I have reviewed the above documentation for accuracy and completeness, and I agree with the above.  Sarina Ser, MD

## 2021-11-20 NOTE — Patient Instructions (Addendum)
Cryotherapy Aftercare  Wash gently with soap and water everyday.   Apply Vaseline and Band-Aid daily until healed.     Due to recent changes in healthcare laws, you may see results of your pathology and/or laboratory studies on MyChart before the doctors have had a chance to review them. We understand that in some cases there may be results that are confusing or concerning to you. Please understand that not all results are received at the same time and often the doctors may need to interpret multiple results in order to provide you with the best plan of care or course of treatment. Therefore, we ask that you please give us 2 business days to thoroughly review all your results before contacting the office for clarification. Should we see a critical lab result, you will be contacted sooner.   If You Need Anything After Your Visit  If you have any questions or concerns for your doctor, please call our main line at [phone] and press option 4 to reach your doctor's medical assistant. If no one answers, please leave a voicemail as directed and we will return your call as soon as possible. Messages left after 4 pm will be answered the following business day.   You may also send us a message via MyChart. We typically respond to MyChart messages within 1-2 business days.  For prescription refills, please ask your pharmacy to contact our office. Our fax number is [phone].  If you have an urgent issue when the clinic is closed that cannot wait until the next business day, you can page your doctor at the number below.    Please note that while we do our best to be available for urgent issues outside of office hours, we are not available 24/7.   If you have an urgent issue and are unable to reach us, you may choose to seek medical care at your doctor's office, retail clinic, urgent care center, or emergency room.  If you have a medical emergency, please immediately call 911 or go to the  emergency department.  Pager Numbers  - Dr. Kowalski: [phone]  - Dr. Moye: [phone]  - Dr. Stewart: [phone]  In the event of inclement weather, please call our main line at [phone] for an update on the status of any delays or closures.  Dermatology Medication Tips: Please keep the boxes that topical medications come in in order to help keep track of the instructions about where and how to use these. Pharmacies typically print the medication instructions only on the boxes and not directly on the medication tubes.   If your medication is too expensive, please contact our office at [phone] option 4 or send us a message through MyChart.   We are unable to tell what your co-pay for medications will be in advance as this is different depending on your insurance coverage. However, we may be able to find a substitute medication at lower cost or fill out paperwork to get insurance to cover a needed medication.   If a prior authorization is required to get your medication covered by your insurance company, please allow us 1-2 business days to complete this process.  Drug prices often vary depending on where the prescription is filled and some pharmacies may offer cheaper prices.  The website www.goodrx.com contains coupons for medications through different pharmacies. The prices here do not account for what the cost may be with help from insurance (it may be cheaper with your insurance), but the website can   give you the price if you did not use any insurance.  - You can print the associated coupon and take it with your prescription to the pharmacy.  - You may also stop by our office during regular business hours and pick up a GoodRx coupon card.  - If you need your prescription sent electronically to a different pharmacy, notify our office through [facility] MyChart or by phone at [phone] option 4.     Si Usted Necesita Algo Despus de Su Visita  Tambin puede  enviarnos un mensaje a travs de MyChart. Por lo general respondemos a los mensajes de MyChart en el transcurso de 1 a 2 das hbiles.  Para renovar recetas, por favor pida a su farmacia que se ponga en contacto con nuestra oficina. Nuestro nmero de fax es el [phone].  Si tiene un asunto urgente cuando la clnica est cerrada y que no puede esperar hasta el siguiente da hbil, puede llamar/localizar a su doctor(a) al nmero que aparece a continuacin.   Por favor, tenga en cuenta que aunque hacemos todo lo posible para estar disponibles para asuntos urgentes fuera del horario de oficina, no estamos disponibles las 24 horas del da, los 7 das de la semana.   Si tiene un problema urgente y no puede comunicarse con nosotros, puede optar por buscar atencin mdica  en el consultorio de su doctor(a), en una clnica privada, en un centro de atencin urgente o en una sala de emergencias.  Si tiene una emergencia mdica, por favor llame inmediatamente al 911 o vaya a la sala de emergencias.  Nmeros de bper  - Dr. Kowalski: [phone]  - Dra. Moye: [phone]  - Dra. Stewart: [phone]  En caso de inclemencias del tiempo, por favor llame a nuestra lnea principal al [phone] para una actualizacin sobre el estado de cualquier retraso o cierre.  Consejos para la medicacin en dermatologa: Por favor, guarde las cajas en las que vienen los medicamentos de uso tpico para ayudarle a seguir las instrucciones sobre dnde y cmo usarlos. Las farmacias generalmente imprimen las instrucciones del medicamento slo en las cajas y no directamente en los tubos del medicamento.   Si su medicamento es muy caro, por favor, pngase en contacto con nuestra oficina llamando al [phone] y presione la opcin 4 o envenos un mensaje a travs de MyChart.   No podemos decirle cul ser su copago por los medicamentos por adelantado ya que esto es diferente dependiendo de la cobertura de su seguro.  Sin embargo, es posible que podamos encontrar un medicamento sustituto a menor costo o llenar un formulario para que el seguro cubra el medicamento que se considera necesario.   Si se requiere una autorizacin previa para que su compaa de seguros cubra su medicamento, por favor permtanos de 1 a 2 das hbiles para completar este proceso.  Los precios de los medicamentos varan con frecuencia dependiendo del lugar de dnde se surte la receta y alguna farmacias pueden ofrecer precios ms baratos.  El sitio web www.goodrx.com tiene cupones para medicamentos de diferentes farmacias. Los precios aqu no tienen en cuenta lo que podra costar con la ayuda del seguro (puede ser ms barato con su seguro), pero el sitio web puede darle el precio si no utiliz ningn seguro.  - Puede imprimir el cupn correspondiente y llevarlo con su receta a la farmacia.  - Tambin puede pasar por nuestra oficina durante el horario de atencin regular y recoger una tarjeta de cupones de GoodRx.  -   Si necesita que su receta se enve electrnicamente a una farmacia diferente, informe a nuestra oficina a travs de MyChart de [place] o por telfono llamando al [phone] y presione la opcin 4.  

## 2021-11-26 ENCOUNTER — Encounter: Payer: Self-pay | Admitting: Dermatology

## 2021-11-27 DIAGNOSIS — Z1211 Encounter for screening for malignant neoplasm of colon: Secondary | ICD-10-CM | POA: Diagnosis not present

## 2021-12-13 DIAGNOSIS — M5416 Radiculopathy, lumbar region: Secondary | ICD-10-CM | POA: Diagnosis not present

## 2021-12-31 DIAGNOSIS — G4733 Obstructive sleep apnea (adult) (pediatric): Secondary | ICD-10-CM | POA: Diagnosis not present

## 2022-01-20 DIAGNOSIS — M5116 Intervertebral disc disorders with radiculopathy, lumbar region: Secondary | ICD-10-CM | POA: Diagnosis not present

## 2022-01-20 DIAGNOSIS — M5416 Radiculopathy, lumbar region: Secondary | ICD-10-CM | POA: Diagnosis not present

## 2022-01-20 DIAGNOSIS — M47816 Spondylosis without myelopathy or radiculopathy, lumbar region: Secondary | ICD-10-CM | POA: Diagnosis not present

## 2022-02-21 DIAGNOSIS — Z79899 Other long term (current) drug therapy: Secondary | ICD-10-CM | POA: Diagnosis not present

## 2022-02-21 DIAGNOSIS — I10 Essential (primary) hypertension: Secondary | ICD-10-CM | POA: Diagnosis not present

## 2022-02-21 DIAGNOSIS — N183 Chronic kidney disease, stage 3 unspecified: Secondary | ICD-10-CM | POA: Diagnosis not present

## 2022-02-21 DIAGNOSIS — D649 Anemia, unspecified: Secondary | ICD-10-CM | POA: Diagnosis not present

## 2022-02-21 DIAGNOSIS — E7800 Pure hypercholesterolemia, unspecified: Secondary | ICD-10-CM | POA: Diagnosis not present

## 2022-03-04 DIAGNOSIS — H401132 Primary open-angle glaucoma, bilateral, moderate stage: Secondary | ICD-10-CM | POA: Diagnosis not present

## 2022-03-10 DIAGNOSIS — H401133 Primary open-angle glaucoma, bilateral, severe stage: Secondary | ICD-10-CM | POA: Diagnosis not present

## 2022-03-14 DIAGNOSIS — G4733 Obstructive sleep apnea (adult) (pediatric): Secondary | ICD-10-CM | POA: Diagnosis not present

## 2022-03-14 DIAGNOSIS — H6122 Impacted cerumen, left ear: Secondary | ICD-10-CM | POA: Diagnosis not present

## 2022-04-04 DIAGNOSIS — J441 Chronic obstructive pulmonary disease with (acute) exacerbation: Secondary | ICD-10-CM | POA: Diagnosis not present

## 2022-04-04 DIAGNOSIS — J40 Bronchitis, not specified as acute or chronic: Secondary | ICD-10-CM | POA: Diagnosis not present

## 2022-05-26 ENCOUNTER — Ambulatory Visit
Admission: EM | Admit: 2022-05-26 | Discharge: 2022-05-26 | Disposition: A | Discharge status: Home / Self Care | Payer: PPO | Attending: Emergency Medicine | Admitting: Emergency Medicine

## 2022-05-26 ENCOUNTER — Ambulatory Visit: Payer: PPO | Admitting: Dermatology

## 2022-05-26 ENCOUNTER — Ambulatory Visit (INDEPENDENT_AMBULATORY_CARE_PROVIDER_SITE_OTHER): Payer: PPO

## 2022-05-26 ENCOUNTER — Ambulatory Visit (HOSPITAL_COMMUNITY): Payer: PPO

## 2022-05-26 DIAGNOSIS — R0602 Shortness of breath: Secondary | ICD-10-CM

## 2022-05-26 DIAGNOSIS — J441 Chronic obstructive pulmonary disease with (acute) exacerbation: Secondary | ICD-10-CM | POA: Diagnosis not present

## 2022-05-26 HISTORY — DX: Chronic obstructive pulmonary disease, unspecified: J44.9

## 2022-05-26 MED ORDER — AZITHROMYCIN 250 MG PO TABS: 250.0000 mg | ORAL_TABLET | Freq: Every day | ORAL | 0 refills | Status: DC

## 2022-05-26 MED ORDER — ALBUTEROL SULFATE (2.5 MG/3ML) 0.083% IN NEBU
2.5000 mg | INHALATION_SOLUTION | RESPIRATORY_TRACT | Status: AC
  Administered 2022-05-26: 2.5 mg via RESPIRATORY_TRACT

## 2022-05-26 MED ORDER — DOXYCYCLINE HYCLATE 100 MG PO CAPS: 100.0000 mg | ORAL_CAPSULE | Freq: Two times a day (BID) | ORAL | 0 refills | Status: DC

## 2022-05-26 MED ORDER — PREDNISONE 10 MG PO TABS: 20.0000 mg | ORAL_TABLET | Freq: Every day | ORAL | 0 refills | Status: DC

## 2022-05-26 NOTE — Discharge Instructions (Addendum)
Go to the emergency department if you have shortness of breath or other concerning symptoms.    Take the prednsione and doxycycline as directed.    Follow up with your primary care provider tomorrow.

## 2022-05-26 NOTE — ED Provider Notes (Signed)
[facility]    CSN: [id number] Arrival date & time: 05/26/22  1027      History   Chief Complaint Chief Complaint  Patient presents with   Shortness of Breath    HPI Gavin Maxwell is a 77 y.o. male.  Accompanied by his wife, patient presents with 2-day history of cough, chest congestion, shortness of breath, body aches.  No fever, chest pain, focal weakness, or other symptoms.   He used his albuterol inhaler this morning.  No OTC medications today; took Robitussin yesterday.  His medical history includes COPD, primary spontaneous pneumothorax, pneumonia, CKD, hypertension, non-Hodgkin's lymphoma, GERD.     The history is provided by the patient, the spouse and medical records.    Past Medical History:  Diagnosis Date   Anxiety    Cancer ([place])    non hodgins  lymphoma   2004 in remission  Left arm  wears a brace there is a bone broken   CKD (chronic kidney disease) stage 3, GFR 30-59 ml/min (HCC)    COPD (chronic obstructive pulmonary disease) (HCC)    Depression    Dysrhythmia    SVT   GERD (gastroesophageal reflux disease)    History of hiatal hernia    Hypertension    Pneumonia    Primary localized osteoarthritis of knee 09/26/2014   Primary localized osteoarthrosis, shoulder region, rotator cuff arthropathy 10/04/2013   Sleep apnea    cpap    Patient Active Problem List   Diagnosis Date Noted   Radiculopathy 06/05/2021   Osteoarthritis of right hip 07/26/2019   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   Hypomagnesemia 12/27/2018   SVT (supraventricular tachycardia) 12/13/2018   GERD (gastroesophageal reflux disease) 08/24/2017   Hyperlipidemia 08/24/2017   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[place]) 08/24/2017   Primary spontaneous pneumothorax    Pneumothorax on right 08/11/2017   Essential hypertension 04/14/2017   Neurocardiogenic pre-syncope 03/27/2017   Stiffness of hip joint 01/14/2016   Chronic midline low back pain without sciatica 12/17/2015   CKD  (chronic kidney disease) stage 3, GFR 30-59 ml/min ([place]) 12/17/2015   Primary localized osteoarthritis of knee 09/26/2014   Knee osteoarthritis 09/26/2014   Primary localized osteoarthrosis, shoulder region, rotator cuff arthropathy 10/04/2013   Chronic anemia 10/03/2013   Renal insufficiency 10/03/2013    Past Surgical History:  Procedure Laterality Date   CARDIAC CATHETERIZATION     20 yrs. ago   CHOLECYSTECTOMY     EYE SURGERY Left    pt states he was "seeing double" and they did surgery to fix it   NASAL SINUS SURGERY     x2   REVERSE SHOULDER ARTHROPLASTY Right 10/04/2013   Procedure: REVERSE SHOULDER ARTHROPLASTY;  Surgeon: Johnny Bridge, MD;  Location: [facility];  Service: Orthopedics;  Laterality: Right;   SHOULDER ACROMIOPLASTY Left    x 5 shoulder surgeries   SVT ABLATION N/A 03/07/2019   Procedure: SVT ABLATION;  Surgeon: Constance Haw, MD;  Location: [facility];  Service: Cardiovascular;  Laterality: N/A;   TOTAL HIP ARTHROPLASTY Right 07/26/2019   Procedure: TOTAL HIP ARTHROPLASTY;  Surgeon: Marchia Bond, MD;  Location: [facility];  Service: Orthopedics;  Laterality: Right;   TOTAL HIP ARTHROPLASTY Left 1998   TOTAL KNEE ARTHROPLASTY Right 09/26/2014   Procedure: RIGHT TOTAL KNEE ARTHROPLASTY;  Surgeon: Marchia Bond, MD;  Location: [facility];  Service: Orthopedics;  Laterality: Right;   TRANSFORAMINAL LUMBAR INTERBODY FUSION (TLIF) WITH PEDICLE SCREW FIXATION 2 LEVEL Right 06/05/2021  Procedure: RIGHT-SIDED LUMBAR 3- LUMBAR 4, LUMBAR 4- LUMBAR 5 TRANSFORAMINAL LUMBAR INTERBODY FUSION AND DECOMPRESSION WITH INSTRUMENTATION AND ALLOGRAFT;  Surgeon: Phylliss Bob, MD;  Location: [facility];  Service: Orthopedics;  Laterality: Right;   VIDEO ASSISTED THORACOSCOPY (VATS) W/TALC PLEUADESIS Right 08/18/2017   Procedure: VIDEO ASSISTED THORACOSCOPY (VATS)POSSIBLE  W/TALC PLEUADESIS.POSSIBLE BLEBECTOMY;  Surgeon: Nestor Lewandowsky, MD;  Location: [facility];  Service: Thoracic;   Laterality: Right;       Home Medications    Prior to Admission medications   Medication Sig Start Date End Date Taking? Authorizing Provider  doxycycline (VIBRAMYCIN) 100 MG capsule Take 1 capsule (100 mg total) by mouth 2 (two) times daily for 7 days. 05/26/22 06/02/22 Yes Sharion Balloon, NP  predniSONE (DELTASONE) 10 MG tablet Take 2 tablets (20 mg total) by mouth daily for 5 days. 05/26/22 05/31/22 Yes Sharion Balloon, NP  acetaminophen (TYLENOL) 500 MG tablet Take 1,000 mg by mouth 2 (two) times daily as needed for moderate pain.    [provider]  albuterol (PROVENTIL HFA;VENTOLIN HFA) 108 (90 Base) MCG/ACT inhaler Inhale 2 puffs into the lungs every 6 (six) hours as needed for wheezing or shortness of breath. 11/03/17   Wilhelmina Mcardle, MD  aspirin EC 81 MG tablet Take 1 tablet (81 mg total) by mouth daily. Swallow whole. 05/13/21   Minna Merritts, MD  atorvastatin (LIPITOR) 80 MG tablet Take 1 tablet (80 mg total) by mouth daily. 05/13/21   Minna Merritts, MD  brimonidine-timolol (COMBIGAN) 0.2-0.5 % ophthalmic solution Place 1 drop into both eyes every 12 (twelve) hours.    [provider]  Calcium Carb-Cholecalciferol (CALCIUM 600 + D PO) Take 1 tablet by mouth daily.    [provider]  fenofibrate 160 MG tablet Take 160 mg by mouth daily.     [provider]  ferrous sulfate 325 (65 FE) MG tablet Take 325 mg by mouth daily.    [provider]  fluticasone (FLONASE) 50 MCG/ACT nasal spray Place 2 sprays into both nostrils daily.     [provider]  fluticasone-salmeterol (ADVAIR) 100-50 MCG/ACT AEPB Inhale 1 puff into the lungs 2 (two) times daily. Patient not taking: Reported on 05/26/2022 05/17/21   [provider]  gabapentin (NEURONTIN) 300 MG capsule Take 300 mg by mouth 3 (three) times daily.    [provider]  HYDROcodone-acetaminophen (NORCO/VICODIN) 5-325 MG tablet Take 1-2 tablets by mouth every 4 (four)  hours as needed for moderate pain or severe pain ((score 4 to 6)). 06/06/21   Phylliss Bob, MD  magnesium oxide (MAG-OX) 400 MG tablet Take 400 mg by mouth 3 (three) times daily. 01/23/19   [provider]  methocarbamol (ROBAXIN) 500 MG tablet Take 1 tablet (500 mg total) by mouth every 6 (six) hours as needed for muscle spasms. 06/06/21   Phylliss Bob, MD  metoprolol succinate (TOPROL-XL) 25 MG 24 hr tablet Take 1 tablet (25 mg total) by mouth every evening. 05/13/21   Minna Merritts, MD  Multiple Vitamin (MULTIVITAMIN WITH MINERALS) TABS tablet Take 1 tablet by mouth daily.    [provider]  pantoprazole (PROTONIX) 40 MG tablet Take 40 mg by mouth daily.     [provider]  polycarbophil (FIBERCON) 625 MG tablet Take 625 mg by mouth 2 (two) times daily.    [provider]  sertraline (ZOLOFT) 50 MG tablet Take 50 mg by mouth at bedtime.    [provider]  Family History Family History  Problem Relation Age of Onset   Breast cancer Mother    Hypertension Mother    Rheum arthritis Father    Cancer Father    Lung cancer Brother    Stroke Paternal Grandfather     Social History Social History   Tobacco Use   Smoking status: Former    Packs/day: 2.00    Years: 20.00    Total pack years: 40.00    Types: Cigarettes    Quit date: 1990    Years since quitting: 34.1   Smokeless tobacco: Never   Tobacco comments:    quit 25 years ago  Vaping Use   Vaping Use: Never used  Substance Use Topics   Alcohol use: No   Drug use: No     Allergies   Augmentin [amoxicillin-pot clavulanate], Celebrex [celecoxib], and Oxycodone   Review of Systems Review of Systems  Constitutional:  Negative for chills and fever.  HENT:  Negative for ear pain and sore throat.   Respiratory:  Positive for cough, chest tightness and shortness of breath.   Cardiovascular:  Negative for chest pain and palpitations.  Gastrointestinal:  Negative for  diarrhea and vomiting.  Skin:  Negative for rash.  Neurological:  Negative for weakness and numbness.  All other systems reviewed and are negative.    Physical Exam Triage Vital Signs ED Triage Vitals  Enc Vitals Group     BP      Pulse      Resp      Temp      Temp src      SpO2      Weight      Height      Head Circumference      Peak Flow      Pain Score      Pain Loc      Pain Edu?      Excl. in [place]?    No data found.  Updated Vital Signs BP 108/62   Pulse (!) 114   Temp 98.4 F (36.9 C)   Resp 18   SpO2 94%   Visual Acuity Right Eye Distance:   Left Eye Distance:   Bilateral Distance:    Right Eye Near:   Left Eye Near:    Bilateral Near:     Physical Exam Vitals and nursing note reviewed.  Constitutional:      General: He is not in acute distress.    Appearance: He is well-developed. He is ill-appearing.  HENT:     Right Ear: Tympanic membrane normal.     Left Ear: Tympanic membrane normal.     Nose: Nose normal.     Mouth/Throat:     Mouth: Mucous membranes are moist.     Pharynx: Oropharynx is clear.  Cardiovascular:     Rate and Rhythm: Normal rate and regular rhythm.     Heart sounds: Normal heart sounds.  Pulmonary:     Effort: Pulmonary effort is normal. No respiratory distress.     Breath sounds: Wheezing and rhonchi present.  Musculoskeletal:     Cervical back: Neck supple.  Skin:    General: Skin is warm and dry.  Neurological:     Mental Status: He is alert.  Psychiatric:        Mood and Affect: Mood normal.        Behavior: Behavior normal.      UC Treatments / Results  Labs (all labs ordered are  listed, but only abnormal results are displayed) Labs Reviewed  SARS CORONAVIRUS 2 (TAT 6-24 HRS)    EKG   Radiology DG Chest 2 View  Result Date: 05/26/2022 CLINICAL DATA:  Shortness of breath EXAM: CHEST - 2 VIEW COMPARISON:  CT 06/01/2020. FINDINGS: Calcified aorta. Normal cardiopericardial silhouette. Apical pleural  thickening. No consolidation, pneumothorax or effusion. Interstitial prominence again seen which is likely chronic. 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 Please correlate with clinical history IMPRESSION: CHRONIC CHANGES.  NO ACUTE CARDIOPULMONARY DISEASE.: IMPRESSION: CHRONIC CHANGES.  NO ACUTE CARDIOPULMONARY DISEASE. 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 Please correlate with any known history or dedicated shoulder x-ray when appropriate Electronically Signed   By: Jill Side M.D.   On: 05/26/2022 11:29    Procedures Procedures (including critical care time)  Medications Ordered in UC Medications  albuterol (PROVENTIL) (2.5 MG/3ML) 0.083% nebulizer solution 2.5 mg (2.5 mg Nebulization Given 05/26/22 1044)    Initial Impression / Assessment and Plan / UC Course  I have reviewed the triage vital signs and the nursing notes.  Pertinent labs & imaging results that were available during my care of the patient were reviewed by me and considered in my medical decision making (see chart for details).   Shortness of breath, COPD exacerbation.  Patient declines transfer to the ED.  O2 sat 91% on room air.  Albuterol nebulizer treatment given and O2 sat improved to 94% on room air.  CXR shows no acute lung disease (the shoulder issue is ongoing and chronic; patient wears an arm brace).  COVID pending.  If COVID positive, recommend treatment with molnupiravir.  Treating today with prednisone and doxycycline (patient states Zithromax does not work well for him).  ED precautions discussed.  Instructed him to follow up with his PCP tomorrow.  Education provided on COPD exacerbation.  He agrees to plan of care.      Final Clinical Impressions(s) / UC Diagnoses   Final diagnoses:  Shortness of breath  COPD exacerbation [facility])     Discharge Instructions      Go to the emergency department if you have  shortness of breath or other concerning symptoms.    Take the prednsione and doxycycline as directed.    Follow up with your primary care provider tomorrow.         ED Prescriptions     Medication Sig Dispense Auth. Provider   predniSONE (DELTASONE) 10 MG tablet Take 2 tablets (20 mg total) by mouth daily for 5 days. 10 tablet Sharion Balloon, NP   azithromycin (ZITHROMAX) 250 MG tablet  (Status: Discontinued) Take 1 tablet (250 mg total) by mouth daily. Take first 2 tablets together, then 1 every day until finished. 6 tablet Sharion Balloon, NP   doxycycline (VIBRAMYCIN) 100 MG capsule Take 1 capsule (100 mg total) by mouth 2 (two) times daily for 7 days. 14 capsule Sharion Balloon, NP      PDMP not reviewed this encounter.   Sharion Balloon, NP 05/26/22 1152

## 2022-05-26 NOTE — ED Triage Notes (Addendum)
Patient to Urgent Care with wife, complaints of URI symptoms that started on Saturday. Reports nasal congestion and cough.   Wife reports yesterday patient started complaining of shortness of breath and chest tightness/ congestion. Has been taking alker-seltzer cold and flu/ robitussin. Has used an albuterol inhaler this morning.   Denies any known fevers, reports chills last night.

## 2022-05-27 DIAGNOSIS — Z23 Encounter for immunization: Secondary | ICD-10-CM | POA: Diagnosis not present

## 2022-05-27 DIAGNOSIS — I10 Essential (primary) hypertension: Secondary | ICD-10-CM | POA: Diagnosis not present

## 2022-05-27 DIAGNOSIS — Z03818 Encounter for observation for suspected exposure to other biological agents ruled out: Secondary | ICD-10-CM | POA: Diagnosis not present

## 2022-05-27 DIAGNOSIS — J441 Chronic obstructive pulmonary disease with (acute) exacerbation: Secondary | ICD-10-CM | POA: Diagnosis not present

## 2022-05-28 ENCOUNTER — Other Ambulatory Visit: Payer: Self-pay

## 2022-05-28 ENCOUNTER — Inpatient Hospital Stay
Admission: EM | Admit: 2022-05-28 | Discharge: 2022-06-04 | DRG: 281 | Disposition: A | Discharge status: Home Health | Payer: PPO | Attending: Internal Medicine | Admitting: Internal Medicine

## 2022-05-28 ENCOUNTER — Encounter: Payer: Self-pay | Admitting: Emergency Medicine

## 2022-05-28 ENCOUNTER — Emergency Department: Payer: PPO

## 2022-05-28 ENCOUNTER — Inpatient Hospital Stay: Payer: PPO

## 2022-05-28 DIAGNOSIS — I471 Supraventricular tachycardia, unspecified: Secondary | ICD-10-CM | POA: Diagnosis present

## 2022-05-28 DIAGNOSIS — I4891 Unspecified atrial fibrillation: Principal | ICD-10-CM | POA: Diagnosis present

## 2022-05-28 DIAGNOSIS — F418 Other specified anxiety disorders: Secondary | ICD-10-CM | POA: Diagnosis not present

## 2022-05-28 DIAGNOSIS — Z8571 Personal history of Hodgkin lymphoma: Secondary | ICD-10-CM

## 2022-05-28 DIAGNOSIS — E785 Hyperlipidemia, unspecified: Secondary | ICD-10-CM | POA: Diagnosis not present

## 2022-05-28 DIAGNOSIS — Z823 Family history of stroke: Secondary | ICD-10-CM

## 2022-05-28 DIAGNOSIS — E1122 Type 2 diabetes mellitus with diabetic chronic kidney disease: Secondary | ICD-10-CM | POA: Diagnosis present

## 2022-05-28 DIAGNOSIS — J90 Pleural effusion, not elsewhere classified: Secondary | ICD-10-CM | POA: Diagnosis not present

## 2022-05-28 DIAGNOSIS — N1832 Chronic kidney disease, stage 3b: Secondary | ICD-10-CM | POA: Diagnosis not present

## 2022-05-28 DIAGNOSIS — E875 Hyperkalemia: Secondary | ICD-10-CM | POA: Diagnosis not present

## 2022-05-28 DIAGNOSIS — I5032 Chronic diastolic (congestive) heart failure: Secondary | ICD-10-CM | POA: Diagnosis not present

## 2022-05-28 DIAGNOSIS — I361 Nonrheumatic tricuspid (valve) insufficiency: Secondary | ICD-10-CM | POA: Diagnosis not present

## 2022-05-28 DIAGNOSIS — J441 Chronic obstructive pulmonary disease with (acute) exacerbation: Secondary | ICD-10-CM | POA: Diagnosis present

## 2022-05-28 DIAGNOSIS — Z1152 Encounter for screening for COVID-19: Secondary | ICD-10-CM | POA: Diagnosis not present

## 2022-05-28 DIAGNOSIS — Z981 Arthrodesis status: Secondary | ICD-10-CM

## 2022-05-28 DIAGNOSIS — Z8261 Family history of arthritis: Secondary | ICD-10-CM

## 2022-05-28 DIAGNOSIS — N281 Cyst of kidney, acquired: Secondary | ICD-10-CM | POA: Diagnosis not present

## 2022-05-28 DIAGNOSIS — E669 Obesity, unspecified: Secondary | ICD-10-CM | POA: Diagnosis present

## 2022-05-28 DIAGNOSIS — I130 Hypertensive heart and chronic kidney disease with heart failure and stage 1 through stage 4 chronic kidney disease, or unspecified chronic kidney disease: Secondary | ICD-10-CM | POA: Diagnosis present

## 2022-05-28 DIAGNOSIS — Z79899 Other long term (current) drug therapy: Secondary | ICD-10-CM

## 2022-05-28 DIAGNOSIS — Z7901 Long term (current) use of anticoagulants: Secondary | ICD-10-CM

## 2022-05-28 DIAGNOSIS — I10 Essential (primary) hypertension: Secondary | ICD-10-CM | POA: Diagnosis not present

## 2022-05-28 DIAGNOSIS — R197 Diarrhea, unspecified: Secondary | ICD-10-CM | POA: Diagnosis not present

## 2022-05-28 DIAGNOSIS — N1831 Chronic kidney disease, stage 3a: Secondary | ICD-10-CM

## 2022-05-28 DIAGNOSIS — D631 Anemia in chronic kidney disease: Secondary | ICD-10-CM | POA: Diagnosis not present

## 2022-05-28 DIAGNOSIS — Z96643 Presence of artificial hip joint, bilateral: Secondary | ICD-10-CM | POA: Diagnosis present

## 2022-05-28 DIAGNOSIS — F419 Anxiety disorder, unspecified: Secondary | ICD-10-CM | POA: Diagnosis not present

## 2022-05-28 DIAGNOSIS — Z801 Family history of malignant neoplasm of trachea, bronchus and lung: Secondary | ICD-10-CM

## 2022-05-28 DIAGNOSIS — J9601 Acute respiratory failure with hypoxia: Secondary | ICD-10-CM | POA: Diagnosis not present

## 2022-05-28 DIAGNOSIS — I340 Nonrheumatic mitral (valve) insufficiency: Secondary | ICD-10-CM | POA: Diagnosis not present

## 2022-05-28 DIAGNOSIS — C8590 Non-Hodgkin lymphoma, unspecified, unspecified site: Secondary | ICD-10-CM | POA: Diagnosis present

## 2022-05-28 DIAGNOSIS — F32A Depression, unspecified: Secondary | ICD-10-CM | POA: Diagnosis present

## 2022-05-28 DIAGNOSIS — Z87891 Personal history of nicotine dependence: Secondary | ICD-10-CM

## 2022-05-28 DIAGNOSIS — E860 Dehydration: Secondary | ICD-10-CM | POA: Diagnosis not present

## 2022-05-28 DIAGNOSIS — K449 Diaphragmatic hernia without obstruction or gangrene: Secondary | ICD-10-CM | POA: Diagnosis not present

## 2022-05-28 DIAGNOSIS — N179 Acute kidney failure, unspecified: Secondary | ICD-10-CM

## 2022-05-28 DIAGNOSIS — D509 Iron deficiency anemia, unspecified: Secondary | ICD-10-CM | POA: Diagnosis present

## 2022-05-28 DIAGNOSIS — Z886 Allergy status to analgesic agent status: Secondary | ICD-10-CM

## 2022-05-28 DIAGNOSIS — K219 Gastro-esophageal reflux disease without esophagitis: Secondary | ICD-10-CM | POA: Diagnosis present

## 2022-05-28 DIAGNOSIS — N189 Chronic kidney disease, unspecified: Secondary | ICD-10-CM | POA: Diagnosis not present

## 2022-05-28 DIAGNOSIS — Z880 Allergy status to penicillin: Secondary | ICD-10-CM

## 2022-05-28 DIAGNOSIS — R809 Proteinuria, unspecified: Secondary | ICD-10-CM | POA: Diagnosis not present

## 2022-05-28 DIAGNOSIS — Z803 Family history of malignant neoplasm of breast: Secondary | ICD-10-CM

## 2022-05-28 DIAGNOSIS — I4819 Other persistent atrial fibrillation: Principal | ICD-10-CM | POA: Diagnosis present

## 2022-05-28 DIAGNOSIS — Z8249 Family history of ischemic heart disease and other diseases of the circulatory system: Secondary | ICD-10-CM

## 2022-05-28 DIAGNOSIS — E871 Hypo-osmolality and hyponatremia: Secondary | ICD-10-CM | POA: Diagnosis present

## 2022-05-28 DIAGNOSIS — I5031 Acute diastolic (congestive) heart failure: Secondary | ICD-10-CM

## 2022-05-28 DIAGNOSIS — I959 Hypotension, unspecified: Secondary | ICD-10-CM | POA: Diagnosis not present

## 2022-05-28 DIAGNOSIS — R7989 Other specified abnormal findings of blood chemistry: Secondary | ICD-10-CM | POA: Diagnosis present

## 2022-05-28 DIAGNOSIS — Z96651 Presence of right artificial knee joint: Secondary | ICD-10-CM | POA: Diagnosis present

## 2022-05-28 DIAGNOSIS — I4892 Unspecified atrial flutter: Secondary | ICD-10-CM | POA: Diagnosis present

## 2022-05-28 DIAGNOSIS — K30 Functional dyspepsia: Secondary | ICD-10-CM | POA: Diagnosis not present

## 2022-05-28 DIAGNOSIS — G4733 Obstructive sleep apnea (adult) (pediatric): Secondary | ICD-10-CM | POA: Diagnosis not present

## 2022-05-28 DIAGNOSIS — B349 Viral infection, unspecified: Secondary | ICD-10-CM | POA: Diagnosis present

## 2022-05-28 DIAGNOSIS — I214 Non-ST elevation (NSTEMI) myocardial infarction: Secondary | ICD-10-CM | POA: Diagnosis not present

## 2022-05-28 DIAGNOSIS — Z7982 Long term (current) use of aspirin: Secondary | ICD-10-CM

## 2022-05-28 DIAGNOSIS — I482 Chronic atrial fibrillation, unspecified: Secondary | ICD-10-CM | POA: Diagnosis not present

## 2022-05-28 DIAGNOSIS — I129 Hypertensive chronic kidney disease with stage 1 through stage 4 chronic kidney disease, or unspecified chronic kidney disease: Secondary | ICD-10-CM | POA: Diagnosis not present

## 2022-05-28 DIAGNOSIS — R0602 Shortness of breath: Secondary | ICD-10-CM | POA: Diagnosis not present

## 2022-05-28 DIAGNOSIS — Z7951 Long term (current) use of inhaled steroids: Secondary | ICD-10-CM

## 2022-05-28 DIAGNOSIS — Z6830 Body mass index (BMI) 30.0-30.9, adult: Secondary | ICD-10-CM | POA: Diagnosis not present

## 2022-05-28 DIAGNOSIS — Z885 Allergy status to narcotic agent status: Secondary | ICD-10-CM

## 2022-05-28 DIAGNOSIS — Z96611 Presence of right artificial shoulder joint: Secondary | ICD-10-CM | POA: Diagnosis present

## 2022-05-28 DIAGNOSIS — Z6832 Body mass index (BMI) 32.0-32.9, adult: Secondary | ICD-10-CM

## 2022-05-28 LAB — HEPATITIS PANEL, ACUTE
HCV Ab: NONREACTIVE
Hep A IgM: NONREACTIVE
Hep B C IgM: NONREACTIVE
Hepatitis B Surface Ag: NONREACTIVE

## 2022-05-28 LAB — BASIC METABOLIC PANEL
Anion gap: 9 (ref 5–15)
Anion gap: 13 (ref 5–15)
BUN: 82 mg/dL — ABNORMAL HIGH (ref 8–23)
BUN: 82 mg/dL — ABNORMAL HIGH (ref 8–23)
CO2: 20 mmol/L — ABNORMAL LOW (ref 22–32)
CO2: 16 mmol/L — ABNORMAL LOW (ref 22–32)
Calcium: 8 mg/dL — ABNORMAL LOW (ref 8.9–10.3)
Calcium: 7.8 mg/dL — ABNORMAL LOW (ref 8.9–10.3)
Chloride: 99 mmol/L (ref 98–111)
Chloride: 98 mmol/L (ref 98–111)
Creatinine, Ser: 4.04 mg/dL — ABNORMAL HIGH (ref 0.61–1.24)
Creatinine, Ser: 4.01 mg/dL — ABNORMAL HIGH (ref 0.61–1.24)
GFR, Estimated: 14 mL/min — ABNORMAL LOW (ref >60)
GFR, Estimated: 15 mL/min — ABNORMAL LOW (ref >60)
Glucose, Bld: 120 mg/dL — ABNORMAL HIGH (ref 70–99)
Glucose, Bld: 153 mg/dL — ABNORMAL HIGH (ref 70–99)
Potassium: 4 mmol/L (ref 3.5–5.1)
Potassium: 4.1 mmol/L (ref 3.5–5.1)
Sodium: 128 mmol/L — ABNORMAL LOW (ref 135–145)
Sodium: 127 mmol/L — ABNORMAL LOW (ref 135–145)

## 2022-05-28 LAB — BRAIN NATRIURETIC PEPTIDE: B Natriuretic Peptide: 215.1 pg/mL — ABNORMAL HIGH (ref 0.0–100.0)

## 2022-05-28 LAB — HEMOGLOBIN A1C
Hgb A1c MFr Bld: 5.2 % (ref 4.8–5.6)
Mean Plasma Glucose: 102.54 mg/dL

## 2022-05-28 LAB — CBC WITH DIFFERENTIAL/PLATELET
Abs Immature Granulocytes: 0.12 10*3/uL — ABNORMAL HIGH (ref 0.00–0.07)
Basophils Absolute: 0 10*3/uL (ref 0.0–0.1)
Basophils Relative: 0 %
Eosinophils Absolute: 0.1 10*3/uL (ref 0.0–0.5)
Eosinophils Relative: 1 %
HCT: 34.6 % — ABNORMAL LOW (ref 39.0–52.0)
Hemoglobin: 11.7 g/dL — ABNORMAL LOW (ref 13.0–17.0)
Immature Granulocytes: 1 %
Lymphocytes Relative: 7 %
Lymphs Abs: 0.7 10*3/uL (ref 0.7–4.0)
MCH: 31.7 pg (ref 26.0–34.0)
MCHC: 33.8 g/dL (ref 30.0–36.0)
MCV: 93.8 fL (ref 80.0–100.0)
Monocytes Absolute: 0.8 10*3/uL (ref 0.1–1.0)
Monocytes Relative: 8 %
Neutro Abs: 8.5 10*3/uL — ABNORMAL HIGH (ref 1.7–7.7)
Neutrophils Relative %: 83 %
Platelets: 259 10*3/uL (ref 150–400)
RBC: 3.69 MIL/uL — ABNORMAL LOW (ref 4.22–5.81)
RDW: 13.2 % (ref 11.5–15.5)
Smear Review: NORMAL
WBC: 10.2 10*3/uL (ref 4.0–10.5)
nRBC: 0 % (ref 0.0–0.2)

## 2022-05-28 LAB — APTT: aPTT: 37 seconds — ABNORMAL HIGH (ref 24–36)

## 2022-05-28 LAB — COMPREHENSIVE METABOLIC PANEL
ALT: 56 U/L — ABNORMAL HIGH (ref 0–44)
AST: 128 U/L — ABNORMAL HIGH (ref 15–41)
Albumin: 2.9 g/dL — ABNORMAL LOW (ref 3.5–5.0)
Alkaline Phosphatase: 44 U/L (ref 38–126)
Anion gap: 12 (ref 5–15)
BUN: 82 mg/dL — ABNORMAL HIGH (ref 8–23)
CO2: 19 mmol/L — ABNORMAL LOW (ref 22–32)
Calcium: 8.5 mg/dL — ABNORMAL LOW (ref 8.9–10.3)
Chloride: 96 mmol/L — ABNORMAL LOW (ref 98–111)
Creatinine, Ser: 4.2 mg/dL — ABNORMAL HIGH (ref 0.61–1.24)
GFR, Estimated: 14 mL/min — ABNORMAL LOW (ref >60)
Glucose, Bld: 121 mg/dL — ABNORMAL HIGH (ref 70–99)
Potassium: 3.9 mmol/L (ref 3.5–5.1)
Sodium: 127 mmol/L — ABNORMAL LOW (ref 135–145)
Total Bilirubin: 0.8 mg/dL (ref 0.3–1.2)
Total Protein: 6.7 g/dL (ref 6.5–8.1)

## 2022-05-28 LAB — PROTIME-INR
INR: 1.1 (ref 0.8–1.2)
Prothrombin Time: 14.4 seconds (ref 11.4–15.2)

## 2022-05-28 LAB — URINALYSIS, ROUTINE W REFLEX MICROSCOPIC
Bilirubin Urine: NEGATIVE
Glucose, UA: NEGATIVE mg/dL
Ketones, ur: NEGATIVE mg/dL
Leukocytes,Ua: NEGATIVE
Nitrite: NEGATIVE
Protein, ur: 30 mg/dL — AB
Specific Gravity, Urine: 1.018 (ref 1.005–1.030)
pH: 5 (ref 5.0–8.0)

## 2022-05-28 LAB — TROPONIN I (HIGH SENSITIVITY)
Troponin I (High Sensitivity): 2196 ng/L (ref <18)
Troponin I (High Sensitivity): 1998 ng/L (ref <18)
Troponin I (High Sensitivity): 1810 ng/L (ref <18)

## 2022-05-28 LAB — OSMOLALITY, URINE: Osmolality, Ur: 477 mOsm/kg (ref 300–900)

## 2022-05-28 LAB — SODIUM, URINE, RANDOM: Sodium, Ur: 27 mmol/L

## 2022-05-28 LAB — TSH: TSH: 2.008 u[IU]/mL (ref 0.350–4.500)

## 2022-05-28 LAB — OSMOLALITY: Osmolality: 303 mOsm/kg — ABNORMAL HIGH (ref 275–295)

## 2022-05-28 LAB — HEPARIN LEVEL (UNFRACTIONATED): Heparin Unfractionated: 0.32 IU/mL (ref 0.30–0.70)

## 2022-05-28 MED ORDER — MAGNESIUM OXIDE -MG SUPPLEMENT 400 (240 MG) MG PO TABS
400.0000 mg | ORAL_TABLET | Freq: Three times a day (TID) | ORAL | Status: DC
  Administered 2022-05-28 – 2022-06-04 (×21): 400 mg via ORAL
  Filled 2022-05-28 (×21): qty 1

## 2022-05-28 MED ORDER — PREDNISONE 20 MG PO TABS
20.0000 mg | ORAL_TABLET | Freq: Every day | ORAL | Status: DC
  Administered 2022-05-29 – 2022-06-02 (×5): 20 mg via ORAL
  Filled 2022-05-28 (×5): qty 1

## 2022-05-28 MED ORDER — SODIUM CHLORIDE 0.9 % IV BOLUS
1000.0000 mL | Freq: Once | INTRAVENOUS | Status: AC
  Administered 2022-05-28: 1000 mL via INTRAVENOUS

## 2022-05-28 MED ORDER — PANTOPRAZOLE SODIUM 40 MG PO TBEC
40.0000 mg | DELAYED_RELEASE_TABLET | Freq: Every day | ORAL | Status: DC
  Administered 2022-05-29 – 2022-06-04 (×7): 40 mg via ORAL
  Filled 2022-05-28 (×7): qty 1

## 2022-05-28 MED ORDER — LATANOPROST 0.005 % OP SOLN
1.0000 [drp] | Freq: Every day | OPHTHALMIC | Status: DC
  Administered 2022-05-28 – 2022-06-03 (×7): 1 [drp] via OPHTHALMIC
  Filled 2022-05-28: qty 2.5

## 2022-05-28 MED ORDER — IPRATROPIUM-ALBUTEROL 0.5-2.5 (3) MG/3ML IN SOLN
3.0000 mL | Freq: Four times a day (QID) | RESPIRATORY_TRACT | Status: DC
  Administered 2022-05-29: 3 mL via RESPIRATORY_TRACT
  Filled 2022-05-28: qty 3

## 2022-05-28 MED ORDER — NITROGLYCERIN 0.4 MG SL SUBL: 0.4000 mg | SUBLINGUAL_TABLET | SUBLINGUAL | Status: DC | PRN

## 2022-05-28 MED ORDER — BRIMONIDINE TARTRATE-TIMOLOL 0.2-0.5 % OP SOLN
1.0000 [drp] | Freq: Two times a day (BID) | OPHTHALMIC | Status: DC
  Filled 2022-05-28 (×2): qty 5

## 2022-05-28 MED ORDER — ONDANSETRON HCL 4 MG/2ML IJ SOLN: 4.0000 mg | Freq: Three times a day (TID) | INTRAMUSCULAR | Status: DC | PRN

## 2022-05-28 MED ORDER — FENTANYL CITRATE PF 50 MCG/ML IJ SOSY: 12.5000 ug | PREFILLED_SYRINGE | INTRAMUSCULAR | Status: DC | PRN

## 2022-05-28 MED ORDER — GABAPENTIN 100 MG PO CAPS
100.0000 mg | ORAL_CAPSULE | Freq: Three times a day (TID) | ORAL | Status: DC
  Administered 2022-05-28 – 2022-06-04 (×21): 100 mg via ORAL
  Filled 2022-05-28 (×21): qty 1

## 2022-05-28 MED ORDER — OYSTER SHELL CALCIUM/D3 500-5 MG-MCG PO TABS
1.0000 | ORAL_TABLET | Freq: Every day | ORAL | Status: DC
  Administered 2022-05-29 – 2022-06-04 (×7): 1 via ORAL
  Filled 2022-05-28 (×7): qty 1

## 2022-05-28 MED ORDER — HEPARIN BOLUS VIA INFUSION
4000.0000 [IU] | Freq: Once | INTRAVENOUS | Status: AC
  Administered 2022-05-28: 4000 [IU] via INTRAVENOUS
  Filled 2022-05-28: qty 4000

## 2022-05-28 MED ORDER — DOXYCYCLINE HYCLATE 100 MG PO TABS
100.0000 mg | ORAL_TABLET | Freq: Two times a day (BID) | ORAL | Status: AC
  Administered 2022-05-28 – 2022-06-01 (×9): 100 mg via ORAL
  Filled 2022-05-28 (×9): qty 1

## 2022-05-28 MED ORDER — HEPARIN (PORCINE) 25000 UT/250ML-% IV SOLN
1200.0000 [IU]/h | INTRAVENOUS | Status: DC
  Administered 2022-05-28 – 2022-05-31 (×4): 1200 [IU]/h via INTRAVENOUS
  Filled 2022-05-28 (×4): qty 250

## 2022-05-28 MED ORDER — IMIPRAMINE HCL 25 MG PO TABS
25.0000 mg | ORAL_TABLET | Freq: Every day | ORAL | Status: DC
  Administered 2022-05-29 – 2022-06-03 (×6): 25 mg via ORAL
  Filled 2022-05-28 (×6): qty 1

## 2022-05-28 MED ORDER — ALBUTEROL SULFATE (2.5 MG/3ML) 0.083% IN NEBU: 2.5000 mg | INHALATION_SOLUTION | RESPIRATORY_TRACT | Status: DC | PRN

## 2022-05-28 MED ORDER — FLUTICASONE FUROATE-VILANTEROL 100-25 MCG/ACT IN AEPB: 1.0000 | INHALATION_SPRAY | Freq: Every day | RESPIRATORY_TRACT | Status: DC

## 2022-05-28 MED ORDER — FLUTICASONE PROPIONATE 50 MCG/ACT NA SUSP
2.0000 | Freq: Every day | NASAL | Status: DC
  Administered 2022-05-29 – 2022-06-04 (×7): 2 via NASAL
  Filled 2022-05-28: qty 16

## 2022-05-28 MED ORDER — METOPROLOL TARTRATE 25 MG PO TABS
12.5000 mg | ORAL_TABLET | Freq: Two times a day (BID) | ORAL | Status: DC
  Administered 2022-05-28 – 2022-05-29 (×3): 12.5 mg via ORAL
  Filled 2022-05-28 (×3): qty 1

## 2022-05-28 MED ORDER — FERROUS SULFATE 325 (65 FE) MG PO TABS
325.0000 mg | ORAL_TABLET | Freq: Every day | ORAL | Status: DC
  Administered 2022-05-29 – 2022-06-04 (×7): 325 mg via ORAL
  Filled 2022-05-28 (×7): qty 1

## 2022-05-28 MED ORDER — FENOFIBRATE 160 MG PO TABS
160.0000 mg | ORAL_TABLET | Freq: Every day | ORAL | Status: DC
  Administered 2022-05-29 – 2022-06-04 (×7): 160 mg via ORAL
  Filled 2022-05-28 (×7): qty 1

## 2022-05-28 MED ORDER — ADULT MULTIVITAMIN W/MINERALS CH
1.0000 | ORAL_TABLET | Freq: Every day | ORAL | Status: DC
  Administered 2022-05-29 – 2022-06-04 (×7): 1 via ORAL
  Filled 2022-05-28 (×7): qty 1

## 2022-05-28 MED ORDER — SODIUM CHLORIDE 1 G PO TABS
1.0000 g | ORAL_TABLET | Freq: Two times a day (BID) | ORAL | Status: DC
  Administered 2022-05-28 – 2022-06-04 (×14): 1 g via ORAL
  Filled 2022-05-28 (×16): qty 1

## 2022-05-28 MED ORDER — IPRATROPIUM-ALBUTEROL 0.5-2.5 (3) MG/3ML IN SOLN
3.0000 mL | RESPIRATORY_TRACT | Status: DC
  Administered 2022-05-28 (×2): 3 mL via RESPIRATORY_TRACT
  Filled 2022-05-28 (×3): qty 3

## 2022-05-28 MED ORDER — HYDRALAZINE HCL 20 MG/ML IJ SOLN: 5.0000 mg | INTRAMUSCULAR | Status: DC | PRN

## 2022-05-28 MED ORDER — MOMETASONE FURO-FORMOTEROL FUM 100-5 MCG/ACT IN AERO
2.0000 | INHALATION_SPRAY | Freq: Two times a day (BID) | RESPIRATORY_TRACT | Status: DC
  Administered 2022-05-29 – 2022-06-04 (×13): 2 via RESPIRATORY_TRACT
  Filled 2022-05-28: qty 8.8

## 2022-05-28 MED ORDER — ASPIRIN 81 MG PO TBEC
81.0000 mg | DELAYED_RELEASE_TABLET | Freq: Every day | ORAL | Status: DC
  Administered 2022-05-29 – 2022-06-04 (×7): 81 mg via ORAL
  Filled 2022-05-28 (×7): qty 1

## 2022-05-28 MED ORDER — CALCIUM POLYCARBOPHIL 625 MG PO TABS
625.0000 mg | ORAL_TABLET | Freq: Two times a day (BID) | ORAL | Status: DC
  Administered 2022-05-29 – 2022-06-04 (×13): 625 mg via ORAL
  Filled 2022-05-28 (×13): qty 1

## 2022-05-28 MED ORDER — ATORVASTATIN CALCIUM 80 MG PO TABS
80.0000 mg | ORAL_TABLET | Freq: Every day | ORAL | Status: DC
  Administered 2022-05-29 – 2022-06-04 (×7): 80 mg via ORAL
  Filled 2022-05-28 (×7): qty 1

## 2022-05-28 MED ORDER — SERTRALINE HCL 50 MG PO TABS
50.0000 mg | ORAL_TABLET | Freq: Every day | ORAL | Status: DC
  Administered 2022-05-28 – 2022-06-03 (×7): 50 mg via ORAL
  Filled 2022-05-28 (×7): qty 1

## 2022-05-28 MED ORDER — DM-GUAIFENESIN ER 30-600 MG PO TB12
1.0000 | ORAL_TABLET | Freq: Two times a day (BID) | ORAL | Status: DC | PRN
  Administered 2022-05-28 – 2022-06-04 (×6): 1 via ORAL
  Filled 2022-05-28 (×7): qty 1

## 2022-05-28 NOTE — ED Notes (Signed)
Cards at bedside

## 2022-05-28 NOTE — H&P (Signed)
History and Physical    Gavin Maxwell [id number] DOB: May 30, 1945 DOA: 05/28/2022  Referring MD/NP/PA:   PCP: Idelle Crouch, MD   Patient coming from:  The patient is coming from home.  At baseline, pt is independent for most of ADL.        Chief Complaint: SOB and diarrhea  HPI: Gavin Maxwell is a 77 y.o. male with medical history significant of HTN, HLD, COPD not on oxygen at baseline, depression with anxiety, CKD 3A, anemia, hiatal hernia, OSA on CPAP, NHL in remission, SVT, who presents with shortness breath and diarrhea.  Patient states that has SOB for about 5 day, which has been progressively worsening.  Patient was seen by PCP on 2/19, and diagnosed with COPD exacerbation.  Patient given prescription for doxycycline and prednisone, without significant improvement.  He continues to have shortness breath, dry cough, denies chest pain, fever or chills. Patient also reports diarrhea in the past 3 days.  Patient has multiple episodes of watery diarrhea each day.  No nausean vomiting or abdominal pain.  No symptoms of UTI.  He has generalized weakness.  No fall.  No dark stool or rectal bleeding.  Data reviewed independently and ED Course: pt was found to have WBC 10.2, BNP  215, negative PCR for COVID yesterday, worsening renal function with creatinine 4.02, BUN 82, GFR 14 (baseline creatinine 1.60 on 06/03/2021), hyponatremia with sodium 127, abnormal liver function (ALP 44, AST 128, ALT 56, total bilirubin 0.8), temperature normal, soft blood pressure, heart rate 55, 123, 58, RR 25, oxygen saturation 99% on 2 L oxygen.  Chest x-ray showed bilateral pleural effusion and vascular congestion.  Patient is admitted to PCU as inpatient.  Dr. Saunders Revel of cardiology is consulted.   EKG: I have personally reviewed.  Atrial fibrillation, QTc 496, heart rate 130, LAD, poor R wave progression   Review of Systems:   General: no fevers, chills, no body weight gain, has poor appetite, has  fatigue HEENT: no blurry vision, hearing changes or sore throat Respiratory: has dyspnea, coughing, no wheezing CV: no chest pain, no palpitations GI: no nausea, vomiting, abdominal pain, has diarrhea, no constipation GU: no dysuria, burning on urination, increased urinary frequency, hematuria  Ext: has leg edema Neuro: no unilateral weakness, numbness, or tingling, no vision change or hearing loss Skin: no rash, no skin tear. MSK: No muscle spasm, no deformity, no limitation of range of movement in spin Heme: No easy bruising.  Travel history: No recent long distant travel.   Allergy:  Allergies  Allergen Reactions   Augmentin [Amoxicillin-Pot Clavulanate] Nausea Only    Did it involve swelling of the face/tongue/throat, SOB, or low BP? No Did it involve sudden or severe rash/hives, skin peeling, or any reaction on the inside of your mouth or nose? No Did you need to seek medical attention at a hospital or doctor's office? No When did it last happen? More than 10 years If all above answers are "NO", may proceed with cephalosporin use.    Celebrex [Celecoxib] Nausea Only   Oxycodone Other (See Comments)    Confusion, makes him "crazy"    Past Medical History:  Diagnosis Date   Anxiety    Cancer ([place])    non hodgins  lymphoma   2004 in remission  Left arm  wears a brace there is a bone broken   CKD (chronic kidney disease) stage 3, GFR 30-59 ml/min (HCC)    COPD (chronic obstructive pulmonary disease) ([place])  Depression    Dysrhythmia    SVT   GERD (gastroesophageal reflux disease)    History of hiatal hernia    Hypertension    Pneumonia    Primary localized osteoarthritis of knee 09/26/2014   Primary localized osteoarthrosis, shoulder region, rotator cuff arthropathy 10/04/2013   Sleep apnea    cpap    Past Surgical History:  Procedure Laterality Date   CARDIAC CATHETERIZATION     20 yrs. ago   CHOLECYSTECTOMY     EYE SURGERY Left    pt states he was "seeing  double" and they did surgery to fix it   NASAL SINUS SURGERY     x2   REVERSE SHOULDER ARTHROPLASTY Right 10/04/2013   Procedure: REVERSE SHOULDER ARTHROPLASTY;  Surgeon: Johnny Bridge, MD;  Location: [facility];  Service: Orthopedics;  Laterality: Right;   SHOULDER ACROMIOPLASTY Left    x 5 shoulder surgeries   SVT ABLATION N/A 03/07/2019   Procedure: SVT ABLATION;  Surgeon: Constance Haw, MD;  Location: [facility];  Service: Cardiovascular;  Laterality: N/A;   TOTAL HIP ARTHROPLASTY Right 07/26/2019   Procedure: TOTAL HIP ARTHROPLASTY;  Surgeon: Marchia Bond, MD;  Location: [facility];  Service: Orthopedics;  Laterality: Right;   TOTAL HIP ARTHROPLASTY Left 1998   TOTAL KNEE ARTHROPLASTY Right 09/26/2014   Procedure: RIGHT TOTAL KNEE ARTHROPLASTY;  Surgeon: Marchia Bond, MD;  Location: [facility];  Service: Orthopedics;  Laterality: Right;   TRANSFORAMINAL LUMBAR INTERBODY FUSION (TLIF) WITH PEDICLE SCREW FIXATION 2 LEVEL Right 06/05/2021   Procedure: RIGHT-SIDED LUMBAR 3- LUMBAR 4, LUMBAR 4- LUMBAR 5 TRANSFORAMINAL LUMBAR INTERBODY FUSION AND DECOMPRESSION WITH INSTRUMENTATION AND ALLOGRAFT;  Surgeon: Phylliss Bob, MD;  Location: [facility];  Service: Orthopedics;  Laterality: Right;   VIDEO ASSISTED THORACOSCOPY (VATS) W/TALC PLEUADESIS Right 08/18/2017   Procedure: VIDEO ASSISTED THORACOSCOPY (VATS)POSSIBLE  W/TALC PLEUADESIS.POSSIBLE BLEBECTOMY;  Surgeon: Nestor Lewandowsky, MD;  Location: [facility];  Service: Thoracic;  Laterality: Right;    Social History:  reports that he quit smoking about 34 years ago. His smoking use included cigarettes. He has a 40.00 pack-year smoking history. He has never used smokeless tobacco. He reports that he does not drink alcohol and does not use drugs.  Family History:  Family History  Problem Relation Age of Onset   Breast cancer Mother    Hypertension Mother    Rheum arthritis Father    Cancer Father    Lung cancer Brother    Stroke Paternal  Grandfather      Prior to Admission medications   Medication Sig Start Date End Date Taking? Authorizing Provider  acetaminophen (TYLENOL) 500 MG tablet Take 1,000 mg by mouth 2 (two) times daily as needed for moderate pain.    [provider]  albuterol (PROVENTIL HFA;VENTOLIN HFA) 108 (90 Base) MCG/ACT inhaler Inhale 2 puffs into the lungs every 6 (six) hours as needed for wheezing or shortness of breath. 11/03/17   Wilhelmina Mcardle, MD  aspirin EC 81 MG tablet Take 1 tablet (81 mg total) by mouth daily. Swallow whole. 05/13/21   Minna Merritts, MD  atorvastatin (LIPITOR) 80 MG tablet Take 1 tablet (80 mg total) by mouth daily. 05/13/21   Minna Merritts, MD  brimonidine-timolol (COMBIGAN) 0.2-0.5 % ophthalmic solution Place 1 drop into both eyes every 12 (twelve) hours.    [provider]  Calcium Carb-Cholecalciferol (CALCIUM 600 + D PO) Take 1 tablet by mouth daily.    [provider]  doxycycline (VIBRAMYCIN) 100 MG capsule Take 1 capsule (100 mg total) by mouth 2 (two) times daily for 7 days. 05/26/22 06/02/22  Sharion Balloon, NP  fenofibrate 160 MG tablet Take 160 mg by mouth daily.     [provider]  ferrous sulfate 325 (65 FE) MG tablet Take 325 mg by mouth daily.    [provider]  fluticasone (FLONASE) 50 MCG/ACT nasal spray Place 2 sprays into both nostrils daily.     [provider]  fluticasone-salmeterol (ADVAIR) 100-50 MCG/ACT AEPB Inhale 1 puff into the lungs 2 (two) times daily. Patient not taking: Reported on 05/26/2022 05/17/21   [provider]  gabapentin (NEURONTIN) 300 MG capsule Take 300 mg by mouth 3 (three) times daily.    [provider]  HYDROcodone-acetaminophen (NORCO/VICODIN) 5-325 MG tablet Take 1-2 tablets by mouth every 4 (four) hours as needed for moderate pain or severe pain ((score 4 to 6)). 06/06/21   Phylliss Bob, MD  magnesium oxide (MAG-OX) 400 MG tablet Take 400 mg by mouth 3 (three)  times daily. 01/23/19   [provider]  methocarbamol (ROBAXIN) 500 MG tablet Take 1 tablet (500 mg total) by mouth every 6 (six) hours as needed for muscle spasms. 06/06/21   Phylliss Bob, MD  metoprolol succinate (TOPROL-XL) 25 MG 24 hr tablet Take 1 tablet (25 mg total) by mouth every evening. 05/13/21   Minna Merritts, MD  Multiple Vitamin (MULTIVITAMIN WITH MINERALS) TABS tablet Take 1 tablet by mouth daily.    [provider]  pantoprazole (PROTONIX) 40 MG tablet Take 40 mg by mouth daily.     [provider]  polycarbophil (FIBERCON) 625 MG tablet Take 625 mg by mouth 2 (two) times daily.    [provider]  predniSONE (DELTASONE) 10 MG tablet Take 2 tablets (20 mg total) by mouth daily for 5 days. 05/26/22 05/31/22  Sharion Balloon, NP  sertraline (ZOLOFT) 50 MG tablet Take 50 mg by mouth at bedtime.    [provider]    Physical Exam: Vitals:   05/28/22 1245 05/28/22 1300 05/28/22 1315 05/28/22 1330  BP: 109/81 104/80 119/88 100/72  Pulse: (!) 116 (!) 131 (!) 124 (!) 116  Resp: (!) 27 (!) 22 18 (!) 27  Temp:      TempSrc:      SpO2: 99% 100% 100% 100%  Weight:      Height:       General: Not in acute distress HEENT:       Eyes: PERRL, EOMI, no scleral icterus.       ENT: No discharge from the ears and nose, no pharynx injection, no tonsillar enlargement.        Neck: positive JVD, no bruit, no mass felt. Heme: No neck lymph node enlargement. Cardiac: S1/S2, irregularly irregular rhythm, no murmurs, No gallops or rubs. Respiratory: Diffused fine crackles bilaterally. No wheezing or rhonchi. GI: Soft, nondistended, nontender, no rebound pain, no organomegaly, BS present. GU: No hematuria Ext: has trace leg edema bilaterally. 1+DP/PT pulse bilaterally. Musculoskeletal: No joint deformities, No joint redness or warmth, no limitation of ROM in spin. Skin: No rashes.  Neuro: Alert, oriented X3, cranial nerves II-XII grossly intact,  moves all extremities normally. Psych: Patient is not psychotic, no suicidal or hemocidal ideation.  Labs on Admission: I have personally reviewed following labs and imaging studies  CBC: Recent Labs  Lab 05/28/22 1009  WBC 10.2  NEUTROABS 8.5*  HGB 11.7*  HCT  34.6*  MCV 93.8  PLT [id number]   Basic Metabolic Panel: Recent Labs  Lab 05/28/22 1009 05/28/22 1207  NA 127* 128*  K 3.9 4.0  CL 96* 99  CO2 19* 20*  GLUCOSE 121* 120*  BUN 82* 82*  CREATININE 4.20* 4.04*  CALCIUM 8.5* 8.0*   GFR: Estimated Creatinine Clearance: 18.7 mL/min (A) (by C-G formula based on SCr of 4.04 mg/dL (H)). Liver Function Tests: Recent Labs  Lab 05/28/22 1009  AST 128*  ALT 56*  ALKPHOS 44  BILITOT 0.8  PROT 6.7  ALBUMIN 2.9*   No results for input(s): "LIPASE", "AMYLASE" in the last 168 hours. No results for input(s): "AMMONIA" in the last 168 hours. Coagulation Profile: Recent Labs  Lab 05/28/22 1009  INR 1.1   Cardiac Enzymes: No results for input(s): "CKTOTAL", "CKMB", "CKMBINDEX", "TROPONINI" in the last 168 hours. BNP (last 3 results) No results for input(s): "PROBNP" in the last 8760 hours. HbA1C: No results for input(s): "HGBA1C" in the last 72 hours. CBG: No results for input(s): "GLUCAP" in the last 168 hours. Lipid Profile: No results for input(s): "CHOL", "HDL", "LDLCALC", "TRIG", "CHOLHDL", "LDLDIRECT" in the last 72 hours. Thyroid Function Tests: No results for input(s): "TSH", "T4TOTAL", "FREET4", "T3FREE", "THYROIDAB" in the last 72 hours. Anemia Panel: No results for input(s): "VITAMINB12", "FOLATE", "FERRITIN", "TIBC", "IRON", "RETICCTPCT" in the last 72 hours. Urine analysis:    Component Value Date/Time   APPEARANCEUR Clear 09/19/2019 1122   GLUCOSEU Negative 09/19/2019 1122   BILIRUBINUR Negative 09/19/2019 1122   PROTEINUR Negative 09/19/2019 1122   NITRITE Negative 09/19/2019 1122   LEUKOCYTESUR Negative 09/19/2019 1122   Sepsis  Labs: @LABRCNTIP$ (procalcitonin:4,lacticidven:4) )No results found for this or any previous visit (from the past 240 hour(s)).   Radiological Exams on Admission: DG Chest 1 View  Result Date: 05/28/2022 CLINICAL DATA:  Shortness of breath EXAM: CHEST  1 VIEW COMPARISON:  CXR 05/26/22 FINDINGS: New bilateral pleural effusions. No pneumothorax. Unchanged cardiac and mediastinal contours. No focal airspace opacity. There are prominent bilateral interstitial markings, which could represent pulmonary venous congestion. No radiographically apparent displaced rib fracture. Unchanged appearance of the bilateral glenohumeral joints, only partially imaged on the right. Visualized upper abdomen is unremarkable. IMPRESSION: 1. New bilateral pleural effusions. 2. Prominent bilateral interstitial markings, which could represent pulmonary venous congestion. 3. No focal airspace opacity. Electronically Signed   By: Marin Roberts M.D.   On: 05/28/2022 10:57      Assessment/Plan Principal Problem:   NSTEMI (non-ST elevated myocardial infarction) [facility]) Active Problems:   Acute diastolic congestive heart failure ([place])   COPD with acute exacerbation (HCC)   Atrial fibrillation with RVR (HCC)   Essential hypertension   Hyperlipidemia   Acute renal failure superimposed on stage 3a chronic kidney disease (HCC)   Diarrhea   Abnormal LFTs   Hyponatremia   Depression with anxiety   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HCC)   Iron deficiency anemia   Obesity with body mass index of 30.0-39.9   Assessment and Plan:  NSTEMI (non-ST elevated myocardial infarction) ([place]):  Trop  2196. In the setting of worsening renal Fx.  Consulted Dr. Saunders Revel of card.  - admit to progressive unit as inpatient - IV heparin started in ED - Trend Trop - prn Nitroglycerin, fentanyl - Aspirin, lipitor  - Risk factor stratification: will check FLP and A1C  - 2d echo  Acute diastolic congestive heart failure ([place]): pt has trace leg edema, positive  JVD, diffuse fine crackles on auscultation, vascular congestion  on chest x-ray, clinically consistent with acute CHF.  2D echo on 04/09/18 19 showed EF of 50%. -Will hold off Lasix due to hyponatremia  COPD with acute exacerbation [facility]): Currently patient does not have wheezing. -continue home prednisone and doxycycline -Bronchodilators  Atrial fibrillation with RVR ([place]): HR is up to 123.  CHADS2 score is 5 (hypertension, age 58, CHF, CAD).  -pt is on IV heparin now -Continue home metoprolol  Essential hypertension -IV hydralazine as needed -Metoprolol  Hyperlipidemia -Lipitor and fenofibrate  Acute renal failure superimposed on stage 3a chronic kidney disease ([place]): Likely due to dehydration secondary to diarrhea -Avoid using renal toxic medications -hold Mobic -Patient received 1 L normal saline bolus in the ED -Will not give more IV fluid due to possible CHF -f/u US-renal -consulted Dr. Juleen China of renal   Diarrhea -Check C. difficile and GI pathogen panel  Abnormal LFTs: Likely due to liver congestion secondary to CHF -Avoid using Tylenol -Check hepatitis panel  Hyponatremia: Sodium 127, mental status normal.  Likely due to diarrhea - Will check urine sodium, urine osmolality, serum osmolality. - Fluid restriction - IVF: 1L NS in ED - Sodium chloride tablet 1 g twice daily - f/u by BMP q8h - avoid over correction too fast due to risk of central pontine myelinolysis  Depression with anxiety -Continue home medications  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[place]) -In remission -Follow-up with hematology  Obesity with body mass index of 30.0-39.9: Body weight 106 kg, BMI 32.59 -Encourage to lose weight -Exercise and healthy diet    DVT ppx: on IV Heparin     Code Status: Full code  Family Communication:    Yes, patient's wife at bed side.    Disposition Plan:  Anticipate discharge back to previous environment  Consults called:  Dr. Saunders Revel of card and Dr. Juleen China of renal.    Admission status and Level of care: Progressive:     as inpt    Dispo: The patient is from: Home              Anticipated d/c is to: Home              Anticipated d/c date is: 2 days              Patient currently is not medically stable to d/c.    Severity of Illness:  The appropriate patient status for this patient is INPATIENT. Inpatient status is judged to be reasonable and necessary in order to provide the required intensity of service to ensure the patient's safety. The patient's presenting symptoms, physical exam findings, and initial radiographic and laboratory data in the context of their chronic comorbidities is felt to place them at high risk for further clinical deterioration. Furthermore, it is not anticipated that the patient will be medically stable for discharge from the hospital within 2 midnights of admission.   * I certify that at the point of admission it is my clinical judgment that the patient will require inpatient hospital care spanning beyond 2 midnights from the point of admission due to high intensity of service, high risk for further deterioration and high frequency of surveillance required.*       Date of Service 05/28/2022    Ivor Costa [facility]   If 7PM-7AM, please contact night-coverage [email or web address] 05/28/2022, 1:36 PM

## 2022-05-28 NOTE — Consult Note (Signed)
ANTICOAGULATION CONSULT NOTE - Follow-Up  Pharmacy Consult for heparin Indication: chest pain/ACS  Allergies  Allergen Reactions   Augmentin [Amoxicillin-Pot Clavulanate] Nausea Only    Did it involve swelling of the face/tongue/throat, SOB, or low BP? No Did it involve sudden or severe rash/hives, skin peeling, or any reaction on the inside of your mouth or nose? No Did you need to seek medical attention at a hospital or doctor's office? No When did it last happen? More than 10 years If all above answers are "NO", may proceed with cephalosporin use.    Celebrex [Celecoxib] Nausea Only   Oxycodone Other (See Comments)    Confusion, makes him "crazy"    Patient Measurements: Height: 5' 11"$  (180.3 cm) Weight: 106 kg (233 lb 11 oz) IBW/kg (Calculated) : 75.3 Heparin Dosing Weight: 97.7 kg  Vital Signs: Temp: 98.2 F (36.8 C) (02/21 2000) Temp Source: Oral (02/21 2000) BP: 118/66 (02/21 2000) Pulse Rate: 56 (02/21 1641)  Labs: Recent Labs    05/28/22 1009 05/28/22 1207 05/28/22 1510 05/28/22 2015  HGB 11.7*  --   --   --   HCT 34.6*  --   --   --   PLT 259  --   --   --   APTT 37*  --   --   --   LABPROT 14.4  --   --   --   INR 1.1  --   --   --   HEPARINUNFRC  --   --   --  0.32  CREATININE 4.20* 4.04*  --  4.01*  TROPONINIHS 2,196* 1,998* 1,810*  --      Estimated Creatinine Clearance: 18.8 mL/min (A) (by C-G formula based on SCr of 4.01 mg/dL (H)).   Medical History: Past Medical History:  Diagnosis Date   Anxiety    Cancer ([place])    non hodgins  lymphoma   2004 in remission  Left arm  wears a brace there is a bone broken   CKD (chronic kidney disease) stage 3, GFR 30-59 ml/min (HCC)    COPD (chronic obstructive pulmonary disease) (HCC)    Depression    Dysrhythmia    SVT   GERD (gastroesophageal reflux disease)    History of hiatal hernia    Hypertension    Pneumonia    Primary localized osteoarthritis of knee 09/26/2014   Primary localized  osteoarthrosis, shoulder region, rotator cuff arthropathy 10/04/2013   Sleep apnea    cpap    Medications:  No evidence of PTA anticoagulants  Assessment: Pharmacy consulted to dose heparin in this 25 to M presenting to the ED with ACS/STEMI.  CrCl 18, Troponin 2196  Baseline Labs: aPTT 37, INR 1.1, Hgb 11.7, Plts 259  2/21: 2015 HL 0.32, therapeutic x 1   Goal of Therapy:  Heparin level 0.3-0.7 units/ml Monitor platelets by anticoagulation protocol: Yes   Plan:  Heparin therapeutic Continue heparin infusion at 1200 units/hr Check anti-Xa level in 8 hours to confirm Continue to monitor H&H and platelets  Dorothe Pea, PharmD, BCPS Clinical Pharmacist   05/28/2022,8:37 PM

## 2022-05-28 NOTE — ED Notes (Signed)
Pt. Sleeping in room, woke up when this RN entered room. Denies any pain, or needs at this time.

## 2022-05-28 NOTE — ED Notes (Signed)
Date and time results received: 05/28/22 1137 (use smartphrase ".now" to insert current time)  Test: troponin Critical Value: 2196  Name of Provider Notified: Cheri Fowler  Orders Received? Or Actions Taken?: Actions Taken: see orders

## 2022-05-28 NOTE — ED Notes (Signed)
Pt. Had 10 beat run of nonsustained vtach. Asymptomatic. Drs Blaine Hamper and End notified.

## 2022-05-28 NOTE — ED Notes (Signed)
US at bedside

## 2022-05-28 NOTE — ED Notes (Signed)
Dr. Niu at bedside 

## 2022-05-28 NOTE — Consult Note (Signed)
Cardiology Consultation   Patient ID: Gavin Maxwell MRN: [id number]; DOB: 01/06/1946  Admit date: 05/28/2022 Date of Consult: 05/28/2022  PCP:  Idelle Crouch, MD   [place] Providers Cardiologist:  Ida Rogue, MD        Patient Profile:   Gavin Maxwell is a 77 y.o. 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  He was last seen in clinic by Dr Rockey Situ 05/13/21 and was doing well as wella s preparing for scheduled back surgery. No medication changes were made or further testing was needed.   He presented to the [facility] emergency department today after being seen by [facility] walk in clinic for possible AKI needing IVF. He has complaints of weakness, diarrhea, and shortness of breath with exertion. He stated that his symptoms started on last Saturday night. He was having shortness of breath and a forceful cough.He stated that he felt the worst he has felt on Sunday and unfortunately he said he had to wait until Monday to be seen.  So he was evaluated Monday by urgent care and was diagnosed with a COPD exacerbation and placed on prednisone and doxycycline.  Since that time he stated the medications only slightly improved his symptoms and he had  started having nonbloody diarrhea. He denied any chest, chest pressure, palpitations, weight gain, or peripheral edema.On arrival he was found to be in atrial fibrillation RVR on EKG.   Initial vitals: Blood pressure 101/71, pulse of 123, respirations of 24, temperature of 97.6  Pertinent labs: Hemoglobin 11.7, hematocrit 34.6, sodium 127, chloride 96, CO2 19, blood glucose of 121, BUN of 82, serum creatinine of 4.20, calcium 8.5, albumin 2.9, AST 128, ALT of 56, GFR 14, BNP 215.1, high-sensitivity troponin 2196 and 1998  Imaging: Chest x-ray revealed new bilateral pleural effusions, prominent bilateral interstitial markings which could represent pulmonary venous congestion, no focal airspace opacity  Medications administered in the emergency department: 1 L of normal saline and heparin infusion  Patient was found in new onset atrial fibrillation with elevated high sensitivity troponins started on Heparin infusion and Cardiology was consulted.    Past Medical History:  Diagnosis Date   Anxiety    Cancer ([place])    non hodgins  lymphoma   2004 in remission  Left arm  wears a brace there is a bone broken   CKD (chronic kidney disease) stage 3, GFR 30-59 ml/min (HCC)    COPD (chronic obstructive pulmonary disease) (HCC)    Depression    Dysrhythmia    SVT   GERD (gastroesophageal reflux disease)    History of hiatal hernia    Hypertension    Pneumonia    Primary localized osteoarthritis of knee 09/26/2014   Primary localized osteoarthrosis, shoulder region, rotator cuff arthropathy 10/04/2013   Sleep apnea    cpap  Past Surgical History:  Procedure Laterality Date   CARDIAC CATHETERIZATION     20 yrs. ago   CHOLECYSTECTOMY     EYE SURGERY Left    pt states he was "seeing double" and they did surgery to fix it   NASAL SINUS SURGERY     x2   REVERSE SHOULDER ARTHROPLASTY Right 10/04/2013   Procedure: REVERSE SHOULDER ARTHROPLASTY;  Surgeon: Johnny Bridge, MD;  Location: [place];   Service: Orthopedics;  Laterality: Right;   SHOULDER ACROMIOPLASTY Left    x 5 shoulder surgeries   SVT ABLATION N/A 03/07/2019   Procedure: SVT ABLATION;  Surgeon: Constance Haw, MD;  Location: [place] [facility];  Service: Cardiovascular;  Laterality: N/A;   TOTAL HIP ARTHROPLASTY Right 07/26/2019   Procedure: TOTAL HIP ARTHROPLASTY;  Surgeon: Marchia Bond, MD;  Location: [facility];  Service: Orthopedics;  Laterality: Right;   TOTAL HIP ARTHROPLASTY Left 1998   TOTAL KNEE ARTHROPLASTY Right 09/26/2014   Procedure: RIGHT TOTAL KNEE ARTHROPLASTY;  Surgeon: Marchia Bond, MD;  Location: [place];  Service: Orthopedics;  Laterality: Right;   TRANSFORAMINAL LUMBAR INTERBODY FUSION (TLIF) WITH PEDICLE SCREW FIXATION 2 LEVEL Right 06/05/2021   Procedure: RIGHT-SIDED LUMBAR 3- LUMBAR 4, LUMBAR 4- LUMBAR 5 TRANSFORAMINAL LUMBAR INTERBODY FUSION AND DECOMPRESSION WITH INSTRUMENTATION AND ALLOGRAFT;  Surgeon: Phylliss Bob, MD;  Location: [place];  Service: Orthopedics;  Laterality: Right;   VIDEO ASSISTED THORACOSCOPY (VATS) W/TALC PLEUADESIS Right 08/18/2017   Procedure: VIDEO ASSISTED THORACOSCOPY (VATS)POSSIBLE  W/TALC PLEUADESIS.POSSIBLE BLEBECTOMY;  Surgeon: Nestor Lewandowsky, MD;  Location: [facility];  Service: Thoracic;  Laterality: Right;     Home Medications:  Prior to Admission medications   Medication Sig Start Date End Date Taking? Authorizing Provider  acetaminophen (TYLENOL) 500 MG tablet Take 1,000 mg by mouth 2 (two) times daily as needed for moderate pain.    [provider]  albuterol (PROVENTIL HFA;VENTOLIN HFA) 108 (90 Base) MCG/ACT inhaler Inhale 2 puffs into the lungs every 6 (six) hours as needed for wheezing or shortness of breath. 11/03/17   Wilhelmina Mcardle, MD  aspirin EC 81 MG tablet Take 1 tablet (81 mg total) by mouth daily. Swallow whole. 05/13/21   Minna Merritts, MD  atorvastatin (LIPITOR) 80 MG tablet Take 1 tablet (80 mg total) by mouth daily. 05/13/21   Minna Merritts, MD  brimonidine-timolol (COMBIGAN) 0.2-0.5 % ophthalmic solution Place 1 drop into both eyes every 12 (twelve) hours.    [provider]  Calcium Carb-Cholecalciferol (CALCIUM 600 + D PO) Take 1 tablet by mouth daily.    [provider]  doxycycline (VIBRAMYCIN) 100 MG capsule Take 1 capsule (100 mg total) by mouth 2 (two) times daily for 7 days. 05/26/22 06/02/22  Sharion Balloon, NP  fenofibrate 160 MG tablet Take 160 mg by mouth daily.     [provider]  ferrous sulfate 325 (65 FE) MG tablet Take 325 mg by mouth daily.    [provider]  fluticasone (FLONASE) 50 MCG/ACT nasal spray Place 2 sprays into both nostrils daily.     [provider]  fluticasone-salmeterol (ADVAIR) 100-50 MCG/ACT AEPB Inhale 1 puff into the lungs 2 (two) times daily. Patient not taking: Reported on 05/26/2022 05/17/21   [provider]  gabapentin (NEURONTIN) 300 MG capsule Take 300 mg by mouth 3 (three) times daily.    [provider]  HYDROcodone-acetaminophen (NORCO/VICODIN) 5-325 MG tablet Take 1-2 tablets by mouth every 4 (four)  hours as needed for moderate pain or severe pain ((score 4 to 6)). 06/06/21   Phylliss Bob, MD  magnesium oxide (MAG-OX) 400 MG tablet Take 400 mg by mouth 3 (three) times daily. 01/23/19   [provider]  methocarbamol (ROBAXIN) 500 MG tablet Take 1 tablet (500 mg total) by mouth every 6 (six) hours as needed for muscle spasms. 06/06/21   Phylliss Bob, MD  metoprolol succinate (TOPROL-XL) 25 MG 24 hr tablet Take 1 tablet (25 mg total) by mouth every evening. 05/13/21   Minna Merritts, MD  Multiple Vitamin (MULTIVITAMIN WITH MINERALS) TABS tablet Take 1 tablet by mouth daily.    [provider]  pantoprazole (PROTONIX) 40 MG tablet Take 40 mg by mouth daily.     [provider]  polycarbophil (FIBERCON) 625 MG tablet Take 625 mg by mouth 2 (two) times daily.    [provider]   predniSONE (DELTASONE) 10 MG tablet Take 2 tablets (20 mg total) by mouth daily for 5 days. 05/26/22 05/31/22  Sharion Balloon, NP  sertraline (ZOLOFT) 50 MG tablet Take 50 mg by mouth at bedtime.    [provider]    Inpatient Medications: Scheduled Meds:  ipratropium-albuterol  3 mL Nebulization Q4H   metoprolol tartrate  12.5 mg Oral BID   sodium chloride  1 g Oral BID WC   Continuous Infusions:  heparin 1,200 Units/hr (05/28/22 1229)   PRN Meds: albuterol, dextromethorphan-guaiFENesin, fentaNYL (SUBLIMAZE) injection, hydrALAZINE, nitroGLYCERIN, ondansetron (ZOFRAN) IV  Allergies:    Allergies  Allergen Reactions   Augmentin [Amoxicillin-Pot Clavulanate] Nausea Only    Did it involve swelling of the face/tongue/throat, SOB, or low BP? No Did it involve sudden or severe rash/hives, skin peeling, or any reaction on the inside of your mouth or nose? No Did you need to seek medical attention at a hospital or doctor's office? No When did it last happen? More than 10 years If all above answers are "NO", may proceed with cephalosporin use.    Celebrex [Celecoxib] Nausea Only   Oxycodone Other (See Comments)    Confusion, makes him "crazy"    Social History:   Social History   Socioeconomic History   Marital status: Married    Spouse name: Not on file   Number of children: 2   Years of education: Not on file   Highest education level: Not on file  Occupational History   Not on file  Tobacco Use   Smoking status: Former    Packs/day: 2.00    Years: 20.00    Total pack years: 40.00    Types: Cigarettes    Quit date: 1990    Years since quitting: 34.1   Smokeless tobacco: Never   Tobacco comments:    quit 25 years ago  Vaping Use   Vaping Use: Never used  Substance and Sexual Activity   Alcohol use: No   Drug use: No   Sexual activity: Not Currently    Birth control/protection: None  Other Topics Concern   Not on file  Social History Narrative   Not on  file   Social Determinants of Health   Financial Resource Strain: Not on file  Food Insecurity: Not on file  Transportation Needs: Not on file  Physical Activity: Not on file  Stress: Not on file  Social Connections: Not on file  Intimate Partner Violence: Not on file    Family History:    Family History  Problem Relation Age of Onset  Breast cancer Mother    Hypertension Mother    Rheum arthritis Father    Cancer Father    Lung cancer Brother    Stroke Paternal Grandfather      ROS:  Please see the history of present illness.  Review of Systems  Constitutional:  Positive for malaise/fatigue.  Respiratory:  Positive for cough and shortness of breath.   Gastrointestinal:  Positive for diarrhea.  Neurological:  Positive for weakness.    All other ROS reviewed and negative.     Physical Exam/Data:   Vitals:   05/28/22 1245 05/28/22 1300 05/28/22 1315 05/28/22 1330  BP: 109/81 104/80 119/88 100/72  Pulse: (!) 116 (!) 131 (!) 124 (!) 116  Resp: (!) 27 (!) 22 18 (!) 27  Temp:      TempSrc:      SpO2: 99% 100% 100% 100%  Weight:      Height:        Intake/Output Summary (Last 24 hours) at 05/28/2022 1445 Last data filed at 05/28/2022 1141 Gross per 24 hour  Intake 1000 ml  Output --  Net 1000 ml      05/28/2022   10:03 AM 06/05/2021    6:50 AM 06/03/2021    9:39 AM  Last 3 Weights  Weight (lbs) 233 lb 11 oz 234 lb 234 lb 12.8 oz  Weight (kg) 106 kg 106.142 kg 106.505 kg     Body mass index is 32.59 kg/m.  General:  Well nourished, well developed, in no acute distress HEENT: normal Neck: difficult to assess JVD, due to body habitus Vascular: No carotid bruits; Distal pulses 2+ bilaterally Cardiac:  normal S1, S2; irregularly irregular; no murmur  Lungs:  coarse to auscultation bilaterally, crackles in bibasilar bases, respirations are unlabored at rest on 2L of O2 via [place] Abd: soft, nontender, obese, no hepatomegaly  Ext: trace pretibial  edema Musculoskeletal:  No deformities, BUE and BLE strength normal and equal Skin: warm and dry  Neuro:  CNs 2-12 intact, no focal abnormalities noted Psych:  Normal affect   EKG:  The EKG was personally reviewed and demonstrates:  Atrial fibrillation RVR, IVCD, T wave changes in lateral leads, left axis deviation Telemetry:  Telemetry was personally reviewed and demonstrates:  atrial fibrillation rates 110-130  Relevant CV Studies: Echocardiogram ordered and pending  Echo done 04/2017 (Outside hospital) INTERPRETATION  NORMAL LEFT VENTRICULAR SYSTOLIC FUNCTION   WITH MILD LVH  NORMAL RIGHT VENTRICULAR SYSTOLIC FUNCTION  MILD VALVULAR REGURGITATION (See above)  NO VALVULAR STENOSIS  MILD TR, PR  TRIVIAL MR  EF 50%   Laboratory Data:  High Sensitivity Troponin:   Recent Labs  Lab 05/28/22 1009 05/28/22 1207  TROPONINIHS 2,196* 1,998*     Chemistry Recent Labs  Lab 05/28/22 1009 05/28/22 1207  NA 127* 128*  K 3.9 4.0  CL 96* 99  CO2 19* 20*  GLUCOSE 121* 120*  BUN 82* 82*  CREATININE 4.20* 4.04*  CALCIUM 8.5* 8.0*  GFRNONAA 14* 14*  ANIONGAP 12 9    Recent Labs  Lab 05/28/22 1009  PROT 6.7  ALBUMIN 2.9*  AST 128*  ALT 56*  ALKPHOS 44  BILITOT 0.8   Lipids No results for input(s): "CHOL", "TRIG", "HDL", "LABVLDL", "LDLCALC", "CHOLHDL" in the last 168 hours.  Hematology Recent Labs  Lab 05/28/22 1009  WBC 10.2  RBC 3.69*  HGB 11.7*  HCT 34.6*  MCV 93.8  MCH 31.7  MCHC 33.8  RDW 13.2  PLT 259  Thyroid No results for input(s): "TSH", "FREET4" in the last 168 hours.  BNP Recent Labs  Lab 06/23/22 1009  BNP 215.1*    DDimer No results for input(s): "DDIMER" in the last 168 hours.   Radiology/Studies:  US RENAL  Result Date: 06/23/2022 CLINICAL DATA:  Acute kidney injury in a 76 year old male. EXAM: RENAL / URINARY TRACT ULTRASOUND COMPLETE COMPARISON:  CT imaging from January 2021. FINDINGS: Right Kidney: Renal measurements: 8.5 x 5.1 x  5.2 cm = volume: 119 mL. Small cyst in the RIGHT kidney measuring 1.7 x 1.3 x 1.2 cm. Mild parenchymal scarring of the RIGHT kidney. No hydronephrosis. Increased cortical echogenicity on the RIGHT. Left Kidney: Renal measurements: 9.8 x 5.2 x 5.5 cm = volume: 146 mL. Cysts in the RIGHT kidney largest 4.7 x 3.7 x 3.3 cm arising from the upper pole. No hydronephrosis. Mild increased cortical echogenicity. Trace perinephric fluid or stranding. Bladder: Appears normal for degree of bladder distention. Other: Increased echogenicity is subcutaneous and retroperitoneal fat could be seen in the setting of diffuse edema. IMPRESSION: Signs of medical renal disease and renal cysts.  No hydronephrosis. Question of anasarca. Trace perinephric fluid suggested on the LEFT is nonfocal in appearance and can be seen in the setting of anasarca, renal dysfunction or infection. Correlation with urinalysis may also be helpful. Electronically Signed   By: Zetta Bills M.D.   On: Jun 23, 2022 14:06   DG Chest 1 View  Result Date: 06/23/2022 CLINICAL DATA:  Shortness of breath EXAM: CHEST  1 VIEW COMPARISON:  CXR 05/26/22 FINDINGS: New bilateral pleural effusions. No pneumothorax. Unchanged cardiac and mediastinal contours. No focal airspace opacity. There are prominent bilateral interstitial markings, which could represent pulmonary venous congestion. No radiographically apparent displaced rib fracture. Unchanged appearance of the bilateral glenohumeral joints, only partially imaged on the right. Visualized upper abdomen is unremarkable. IMPRESSION: 1. New bilateral pleural effusions. 2. Prominent bilateral interstitial markings, which could represent pulmonary venous congestion. 3. No focal airspace opacity. Electronically Signed   By: Marin Roberts M.D.   On: 06/23/22 10:57   DG Chest 2 View  Result Date: 05/26/2022 CLINICAL DATA:  Shortness of breath EXAM: CHEST - 2 VIEW COMPARISON:  CT 06/01/2020. FINDINGS: Calcified aorta.  Normal cardiopericardial silhouette. Apical pleural thickening. No consolidation, pneumothorax or effusion. Interstitial prominence again seen which is likely chronic. 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 Please correlate with clinical history IMPRESSION: CHRONIC CHANGES.  NO ACUTE CARDIOPULMONARY DISEASE.: IMPRESSION: CHRONIC CHANGES.  NO ACUTE CARDIOPULMONARY DISEASE. 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 Please correlate with any known history or dedicated shoulder x-ray when appropriate Electronically Signed   By: Jill Side M.D.   On: 05/26/2022 11:29     Assessment and Plan:   NSTEMI  -denies any chest pain -suffered from progressive shortness of breath -HS troponins trended 2196 and 1996 -nonspecific T waves noted on EKG in lateral leads -continued on heparin drip -no current plans for invasive procedures due to serum creatinine of 4.04 -will re-evaluate as acute conditions improve -continue with cardiac monitoring -EKG as needed pain or changes -continue on asa and statin therapy  New onset atrial fibrillation RVR -remains in atrial fibrillation on cardiac monitor -continued on heparin drip for CHADS2-VASc score of at least 3 -started on metoprolol tartrate 12.5 mg bid for better rate control -continue with cardiac monitor -not a candidate for digoxin due to AKI -continue with rate control strategy  as tolerated -will need to be transitioned to [place] prior to discharge  -TSH ordered, last result from 10/22 4.064  Acute Renal Failure on stage IIIa CKD -serum creatinine 4.04 -baseline serum creatinine 1.7-1.8 -received 1L of IVF already in the ED -renal ultrasound pending -monitor urine output -daily bmp -monitor/trend/replete electrolytes as needed -avoid nephrotoxic agents where able  Shortness of breath/ recent diagnosis of COPD exacerbation -likely CHF with symptoms and  exam findings -crackles in lung bases, trace edema, CXR with vascular congestion, slightly elevated BNP -BNP 215.1 -shortness of breath currently wearing oxygen -echocardiogram ordered and pending -previous EF 50% in 2019 -judicious use of IVF -use dieretics sparingly due to AKI -titrate FiO2 to maintain oxygen saturation greater than equal to 92% -daily weight, I&O's, low sodium diet   Hyponatremia -serum sodium 128 -daily bmp -restrict free water  Hypertension -blood pressures have remained soft since arrival -blood pressure 119/88 -PTA medications are on hold -started on low dose metoprolol if blood pressure allows -vital signs per unit protocol  Hyperlipidemia -continue on atorvastatin and fenofibrate  History of SVT s/p ablation -started on low dose metoprolol tartrate 12.5 mg bid  9. OSA -encourage CPAP use at night   Risk Assessment/Risk Scores:     TIMI Risk Score for Unstable Angina or Non-ST Elevation MI:   The patient's TIMI risk score is 4, which indicates a 20% risk of all cause mortality, new or recurrent myocardial infarction or need for urgent revascularization in the next 14 days.  New York Heart Association (NYHA) Functional Class NYHA Class II  CHA2DS2-VASc Score = 3   This indicates a 3.2% annual risk of stroke. The patient's score is based upon: CHF History: 0 HTN History: 1 Diabetes History: 0 Stroke History: 0 Vascular Disease History: 0 Age Score: 2 Gender Score: 0         For questions or updates, please contact [place] Please consult [email or web address] for contact info under    Signed, Norbert Malkin, NP  05/28/2022 2:45 PM

## 2022-05-28 NOTE — ED Notes (Signed)
First Nurse Note: Patient sent by [facility] for possible AKI. MD requesting IV fluids.

## 2022-05-28 NOTE — Consult Note (Addendum)
ANTICOAGULATION CONSULT NOTE - Initial Consult  Pharmacy Consult for heparin Indication: chest pain/ACS  Allergies  Allergen Reactions   Augmentin [Amoxicillin-Pot Clavulanate] Nausea Only    Did it involve swelling of the face/tongue/throat, SOB, or low BP? No Did it involve sudden or severe rash/hives, skin peeling, or any reaction on the inside of your mouth or nose? No Did you need to seek medical attention at a hospital or doctor's office? No When did it last happen? More than 10 years If all above answers are "NO", may proceed with cephalosporin use.    Celebrex [Celecoxib] Nausea Only   Oxycodone Other (See Comments)    Confusion, makes him "crazy"    Patient Measurements: Height: 5' 11"$  (180.3 cm) Weight: 106 kg (233 lb 11 oz) IBW/kg (Calculated) : 75.3 Heparin Dosing Weight: 97.7 kg  Vital Signs: Temp: 97.6 F (36.4 C) (02/21 1005) Temp Source: Oral (02/21 1005) BP: 108/75 (02/21 1130) Pulse Rate: 58 (02/21 1130)  Labs: Recent Labs    05/28/22 1009  HGB 11.7*  HCT 34.6*  PLT 259  CREATININE 4.20*  TROPONINIHS 2,196*    Estimated Creatinine Clearance: 18 mL/min (A) (by C-G formula based on SCr of 4.2 mg/dL (H)).   Medical History: Past Medical History:  Diagnosis Date   Anxiety    Cancer ([place])    non hodgins  lymphoma   2004 in remission  Left arm  wears a brace there is a bone broken   CKD (chronic kidney disease) stage 3, GFR 30-59 ml/min (HCC)    COPD (chronic obstructive pulmonary disease) (HCC)    Depression    Dysrhythmia    SVT   GERD (gastroesophageal reflux disease)    History of hiatal hernia    Hypertension    Pneumonia    Primary localized osteoarthritis of knee 09/26/2014   Primary localized osteoarthrosis, shoulder region, rotator cuff arthropathy 10/04/2013   Sleep apnea    cpap    Medications:  No evidence of PTA anticoagulants  Assessment: Pharmacy consulted to dose heparin in this 53 to M presenting to the ED with  ACS/STEMI.  CrCl 18, Troponin 2196  Baseline Labs: aPTT 37, INR 1.1, Hgb 11.7, Plts 259  Goal of Therapy:  Heparin level 0.3-0.7 units/ml Monitor platelets by anticoagulation protocol: Yes   Plan:  Give 4000 units bolus x 1 Start heparin infusion at 1200 units/hr Check anti-Xa level in 8 hours and daily while on heparin Continue to monitor H&H and platelets  Alison Murray 05/28/2022,11:58 AM

## 2022-05-28 NOTE — ED Triage Notes (Signed)
Pt had labs done at [facility] that showed AKI. Pt endorses some weakness and diarrhea. Endorses SOB with exertion and a cough.

## 2022-05-28 NOTE — ED Provider Notes (Signed)
[facility] Provider Note   Event Date/Time   First MD Initiated Contact with Patient 05/28/22 1001     (approximate) History  Weakness  HPI Gavin Maxwell is a 77 y.o. male with a past medical history of COPD and paroxysmal atrial fibrillation who presents for generalized weakness, shortness of breath, cough, and clear rhinorrhea that has been present over the last 3 days.  Patient was seen at urgent care yesterday and diagnosed with COPD exacerbation before being placed on prednisone and doxycycline.  Patient states that these medications have only slightly improved his symptoms.  Patient is also concerned as he has had diarrhea that is nonbloody over the last 3 days and feels dehydrated ROS: Patient currently denies any vision changes, tinnitus, difficulty speaking, facial droop, sore throat, chest pain, abdominal pain, dysuria, or weakness/numbness/paresthesias in any extremity   Physical Exam  Triage Vital Signs: ED Triage Vitals  Enc Vitals Group     BP 05/28/22 1005 101/71     Pulse Rate 05/28/22 1005 (!) 123     Resp 05/28/22 1005 (!) 24     Temp 05/28/22 1005 97.6 F (36.4 C)     Temp Source 05/28/22 1005 Oral     SpO2 05/28/22 1005 98 %     Weight 05/28/22 1003 233 lb 11 oz (106 kg)     Height 05/28/22 1003 5' 11"$  (1.803 m)     Head Circumference --      Peak Flow --      Pain Score 05/28/22 1003 0     Pain Loc --      Pain Edu? --      Excl. in [place]? --    Most recent vital signs: Vitals:   05/28/22 1500 05/28/22 1515  BP: 94/65 102/71  Pulse: (!) 109 (!) 57  Resp: (!) 23 19  Temp:    SpO2: 100% 100%   General: Awake, oriented x4. CV:  Good peripheral perfusion.  Resp:  Increased effort.  Abd:  No distention.  Other:  Elderly overweight Caucasian male laying in bed in mild respiratory distress ED Results / Procedures / Treatments  Labs (all labs ordered are listed, but only abnormal results are displayed) Labs Reviewed  URINALYSIS,  ROUTINE W REFLEX MICROSCOPIC - Abnormal; Notable for the following components:      Result Value   Color, Urine YELLOW (*)    APPearance HAZY (*)    Hgb urine dipstick MODERATE (*)    Protein, ur 30 (*)    Bacteria, UA RARE (*)    All other components within normal limits  COMPREHENSIVE METABOLIC PANEL - Abnormal; Notable for the following components:   Sodium 127 (*)    Chloride 96 (*)    CO2 19 (*)    Glucose, Bld 121 (*)    BUN 82 (*)    Creatinine, Ser 4.20 (*)    Calcium 8.5 (*)    Albumin 2.9 (*)    AST 128 (*)    ALT 56 (*)    GFR, Estimated 14 (*)    All other components within normal limits  CBC WITH DIFFERENTIAL/PLATELET - Abnormal; Notable for the following components:   RBC 3.69 (*)    Hemoglobin 11.7 (*)    HCT 34.6 (*)    Neutro Abs 8.5 (*)    Abs Immature Granulocytes 0.12 (*)    All other components within normal limits  BRAIN NATRIURETIC PEPTIDE - Abnormal; Notable for the following components:  B Natriuretic Peptide 215.1 (*)    All other components within normal limits  APTT - Abnormal; Notable for the following components:   aPTT 37 (*)    All other components within normal limits  OSMOLALITY - Abnormal; Notable for the following components:   Osmolality 303 (*)    All other components within normal limits  BASIC METABOLIC PANEL - Abnormal; Notable for the following components:   Sodium 128 (*)    CO2 20 (*)    Glucose, Bld 120 (*)    BUN 82 (*)    Creatinine, Ser 4.04 (*)    Calcium 8.0 (*)    GFR, Estimated 14 (*)    All other components within normal limits  TROPONIN I (HIGH SENSITIVITY) - Abnormal; Notable for the following components:   Troponin I (High Sensitivity) 2,196 (*)    All other components within normal limits  TROPONIN I (HIGH SENSITIVITY) - Abnormal; Notable for the following components:   Troponin I (High Sensitivity) 1,998 (*)    All other components within normal limits  TROPONIN I (HIGH SENSITIVITY) - Abnormal; Notable for  the following components:   Troponin I (High Sensitivity) 1,810 (*)    All other components within normal limits  C DIFFICILE QUICK SCREEN W PCR REFLEX    GASTROINTESTINAL PANEL BY PCR, STOOL (REPLACES STOOL CULTURE)  EXPECTORATED SPUTUM ASSESSMENT W GRAM STAIN, RFLX TO RESP C  PROTIME-INR  OSMOLALITY, URINE  SODIUM, URINE, RANDOM  TSH  BASIC METABOLIC PANEL  HEMOGLOBIN A1C  HEPATITIS PANEL, ACUTE  HEPARIN LEVEL (UNFRACTIONATED)   EKG ED ECG REPORT I, Naaman Plummer, the attending physician, personally viewed and interpreted this ECG. Date: 05/28/2022 EKG Time: 1008 Rate: 130 Rhythm: Atrial fibrillation with rapid ventricular response QRS Axis: normal Intervals: normal ST/T Wave abnormalities: normal Narrative Interpretation: Atrial fibrillation with rapid ventricular response.  No evidence of acute ischemia RADIOLOGY ED MD interpretation: Single view portable chest x-ray interpreted independently by me shows new bilateral pleural effusions and prominent bilateral interstitial markings with good represent pulmonary venous congestion -Agree with radiology assessment Official radiology report(s): US RENAL  Result Date: 05/28/2022 CLINICAL DATA:  Acute kidney injury in a 77 year old male. EXAM: RENAL / URINARY TRACT ULTRASOUND COMPLETE COMPARISON:  CT imaging from January 2021. FINDINGS: Right Kidney: Renal measurements: 8.5 x 5.1 x 5.2 cm = volume: 119 mL. Small cyst in the RIGHT kidney measuring 1.7 x 1.3 x 1.2 cm. Mild parenchymal scarring of the RIGHT kidney. No hydronephrosis. Increased cortical echogenicity on the RIGHT. Left Kidney: Renal measurements: 9.8 x 5.2 x 5.5 cm = volume: 146 mL. Cysts in the RIGHT kidney largest 4.7 x 3.7 x 3.3 cm arising from the upper pole. No hydronephrosis. Mild increased cortical echogenicity. Trace perinephric fluid or stranding. Bladder: Appears normal for degree of bladder distention. Other: Increased echogenicity is subcutaneous and  retroperitoneal fat could be seen in the setting of diffuse edema. IMPRESSION: Signs of medical renal disease and renal cysts.  No hydronephrosis. Question of anasarca. Trace perinephric fluid suggested on the LEFT is nonfocal in appearance and can be seen in the setting of anasarca, renal dysfunction or infection. Correlation with urinalysis may also be helpful. Electronically Signed   By: Zetta Bills M.D.   On: 05/28/2022 14:06   DG Chest 1 View  Result Date: 05/28/2022 CLINICAL DATA:  Shortness of breath EXAM: CHEST  1 VIEW COMPARISON:  CXR 05/26/22 FINDINGS: New bilateral pleural effusions. No pneumothorax. Unchanged cardiac and mediastinal contours. No focal  airspace opacity. There are prominent bilateral interstitial markings, which could represent pulmonary venous congestion. No radiographically apparent displaced rib fracture. Unchanged appearance of the bilateral glenohumeral joints, only partially imaged on the right. Visualized upper abdomen is unremarkable. IMPRESSION: 1. New bilateral pleural effusions. 2. Prominent bilateral interstitial markings, which could represent pulmonary venous congestion. 3. No focal airspace opacity. Electronically Signed   By: Marin Roberts M.D.   On: 05/28/2022 10:57   PROCEDURES: Critical Care performed: Yes, see critical care procedure note(s) .1-3 Lead EKG Interpretation  Performed by: Naaman Plummer, MD Authorized by: Naaman Plummer, MD     Interpretation: abnormal     ECG rate:  111   ECG rate assessment: tachycardic     Rhythm: atrial fibrillation     Ectopy: none     Conduction: normal   CRITICAL CARE Performed by: Naaman Plummer  Total critical care time: 31 minutes  Critical care time was exclusive of separately billable procedures and treating other patients.  Critical care was necessary to treat or prevent imminent or life-threatening deterioration.  Critical care was time spent personally by me on the following activities:  development of treatment plan with patient and/or surrogate as well as nursing, discussions with consultants, evaluation of patient's response to treatment, examination of patient, obtaining history from patient or surrogate, ordering and performing treatments and interventions, ordering and review of laboratory studies, ordering and review of radiographic studies, pulse oximetry and re-evaluation of patient's condition.  MEDICATIONS ORDERED IN ED: Medications  sodium chloride tablet 1 g (has no administration in time range)  ipratropium-albuterol (DUONEB) 0.5-2.5 (3) MG/3ML nebulizer solution 3 mL (3 mLs Nebulization Given 05/28/22 1527)  albuterol (PROVENTIL) (2.5 MG/3ML) 0.083% nebulizer solution 2.5 mg (has no administration in time range)  dextromethorphan-guaiFENesin (MUCINEX DM) 30-600 MG per 12 hr tablet 1 tablet (has no administration in time range)  ondansetron (ZOFRAN) injection 4 mg (has no administration in time range)  hydrALAZINE (APRESOLINE) injection 5 mg (has no administration in time range)  heparin ADULT infusion 100 units/mL (25000 units/273m) (1,200 Units/hr Intravenous New Bag/Given 05/28/22 1229)  fentaNYL (SUBLIMAZE) injection 12.5 mcg (has no administration in time range)  nitroGLYCERIN (NITROSTAT) SL tablet 0.4 mg (has no administration in time range)  metoprolol tartrate (LOPRESSOR) tablet 12.5 mg (has no administration in time range)  sodium chloride 0.9 % bolus 1,000 mL (0 mLs Intravenous Stopped 05/28/22 1141)  heparin bolus via infusion 4,000 Units (4,000 Units Intravenous Bolus from Bag 05/28/22 1230)   IMPRESSION / MDM / ASSESSMENT AND PLAN / ED COURSE  I reviewed the triage vital signs and the nursing notes.                             The patient is on the cardiac monitor to evaluate for evidence of arrhythmia and/or significant heart rate changes. Patient's presentation is most consistent with acute presentation with potential threat to life or bodily  function. + atrial fibrillation w/ RVR DDx: Pneumothorax, Pneumonia, Pulmonary Embolus, Tamponade, ACS, Thyrotoxicosis.  No history or evidence decompensated heart failure. Given their history and exam it is likely this patient is unlikely to spontaneously revert to a rate controlled rhythm and necessitates a thorough workup for their arrhythmia. Workup: ECG, CXR, CBC, BMP, UA, Troponin, BNP, TSH, Ca-Mag-Phos Interventions: Defer Cardioversion (uncertain historical reliability with time of onset, increased risk of thromboembolic stroke).  Start diltiazem bolus and drip Patient also shows signs of acute  on chronic renal failure with increasing creatinine from 2.64 Patient also showing signs of NSTEMI with troponin elevation into the 2000's.  I spoke to Dr. Saunders Revel in cardiology who recommended heparin and admission  Disposition: Admit   FINAL CLINICAL IMPRESSION(S) / ED DIAGNOSES   Final diagnoses:  Atrial fibrillation with rapid ventricular response ([place])  Acute renal failure superimposed on chronic kidney disease, unspecified acute renal failure type, unspecified CKD stage ([place])  NSTEMI (non-ST elevated myocardial infarction) ([place])   Rx / DC Orders   ED Discharge Orders     None      Note:  This document was prepared using Dragon voice recognition software and may include unintentional dictation errors.   Naaman Plummer, MD 05/28/22 [phone]

## 2022-05-28 NOTE — ED Notes (Signed)
Rad at bedside.

## 2022-05-29 ENCOUNTER — Other Ambulatory Visit (HOSPITAL_COMMUNITY): Payer: Self-pay

## 2022-05-29 ENCOUNTER — Inpatient Hospital Stay
Admit: 2022-05-29 | Discharge: 2022-05-29 | Disposition: A | Discharge status: Home / Self Care | Payer: PPO | Attending: Internal Medicine | Admitting: Internal Medicine

## 2022-05-29 DIAGNOSIS — I214 Non-ST elevation (NSTEMI) myocardial infarction: Secondary | ICD-10-CM | POA: Diagnosis not present

## 2022-05-29 DIAGNOSIS — J441 Chronic obstructive pulmonary disease with (acute) exacerbation: Secondary | ICD-10-CM

## 2022-05-29 DIAGNOSIS — D509 Iron deficiency anemia, unspecified: Secondary | ICD-10-CM

## 2022-05-29 DIAGNOSIS — N179 Acute kidney failure, unspecified: Secondary | ICD-10-CM | POA: Diagnosis not present

## 2022-05-29 DIAGNOSIS — N189 Chronic kidney disease, unspecified: Secondary | ICD-10-CM

## 2022-05-29 DIAGNOSIS — I4891 Unspecified atrial fibrillation: Secondary | ICD-10-CM | POA: Diagnosis not present

## 2022-05-29 DIAGNOSIS — R197 Diarrhea, unspecified: Secondary | ICD-10-CM

## 2022-05-29 LAB — RESP PANEL BY RT-PCR (RSV, FLU A&B, COVID)  RVPGX2
Influenza A by PCR: NEGATIVE
Influenza B by PCR: NEGATIVE
Resp Syncytial Virus by PCR: NEGATIVE
SARS Coronavirus 2 by RT PCR: NEGATIVE

## 2022-05-29 LAB — ECHOCARDIOGRAM COMPLETE
AR max vel: 3.15 cm2
AV Area VTI: 2.92 cm2
AV Area mean vel: 2.91 cm2
AV Mean grad: 2 mmHg
AV Peak grad: 3.5 mmHg
Ao pk vel: 0.93 m/s
Area-P 1/2: 4.49 cm2
Height: 71 in
S' Lateral: 3.6 cm
Weight: 3580.27 oz

## 2022-05-29 LAB — COMPREHENSIVE METABOLIC PANEL
ALT: 50 U/L — ABNORMAL HIGH (ref 0–44)
AST: 73 U/L — ABNORMAL HIGH (ref 15–41)
Albumin: 2.6 g/dL — ABNORMAL LOW (ref 3.5–5.0)
Alkaline Phosphatase: 46 U/L (ref 38–126)
Anion gap: 12 (ref 5–15)
BUN: 85 mg/dL — ABNORMAL HIGH (ref 8–23)
CO2: 21 mmol/L — ABNORMAL LOW (ref 22–32)
Calcium: 8.4 mg/dL — ABNORMAL LOW (ref 8.9–10.3)
Chloride: 98 mmol/L (ref 98–111)
Creatinine, Ser: 3.75 mg/dL — ABNORMAL HIGH (ref 0.61–1.24)
GFR, Estimated: 16 mL/min — ABNORMAL LOW (ref >60)
Glucose, Bld: 105 mg/dL — ABNORMAL HIGH (ref 70–99)
Potassium: 4.3 mmol/L (ref 3.5–5.1)
Sodium: 131 mmol/L — ABNORMAL LOW (ref 135–145)
Total Bilirubin: 0.9 mg/dL (ref 0.3–1.2)
Total Protein: 6.3 g/dL — ABNORMAL LOW (ref 6.5–8.1)

## 2022-05-29 LAB — LIPID PANEL
Cholesterol: 69 mg/dL (ref 0–200)
HDL: 11 mg/dL — ABNORMAL LOW (ref >40)
LDL Cholesterol: 27 mg/dL (ref 0–99)
Total CHOL/HDL Ratio: 6.3 RATIO
Triglycerides: 154 mg/dL — ABNORMAL HIGH (ref <150)
VLDL: 31 mg/dL (ref 0–40)

## 2022-05-29 LAB — CBC
HCT: 30.1 % — ABNORMAL LOW (ref 39.0–52.0)
Hemoglobin: 10.3 g/dL — ABNORMAL LOW (ref 13.0–17.0)
MCH: 31.7 pg (ref 26.0–34.0)
MCHC: 34.2 g/dL (ref 30.0–36.0)
MCV: 92.6 fL (ref 80.0–100.0)
Platelets: 220 10*3/uL (ref 150–400)
RBC: 3.25 MIL/uL — ABNORMAL LOW (ref 4.22–5.81)
RDW: 13.2 % (ref 11.5–15.5)
WBC: 10.3 10*3/uL (ref 4.0–10.5)
nRBC: 0 % (ref 0.0–0.2)

## 2022-05-29 LAB — BASIC METABOLIC PANEL
Anion gap: 11 (ref 5–15)
Anion gap: 11 (ref 5–15)
BUN: 85 mg/dL — ABNORMAL HIGH (ref 8–23)
BUN: 84 mg/dL — ABNORMAL HIGH (ref 8–23)
CO2: 20 mmol/L — ABNORMAL LOW (ref 22–32)
CO2: 23 mmol/L (ref 22–32)
Calcium: 8.1 mg/dL — ABNORMAL LOW (ref 8.9–10.3)
Calcium: 8.2 mg/dL — ABNORMAL LOW (ref 8.9–10.3)
Chloride: 99 mmol/L (ref 98–111)
Chloride: 97 mmol/L — ABNORMAL LOW (ref 98–111)
Creatinine, Ser: 3.71 mg/dL — ABNORMAL HIGH (ref 0.61–1.24)
Creatinine, Ser: 3.55 mg/dL — ABNORMAL HIGH (ref 0.61–1.24)
GFR, Estimated: 16 mL/min — ABNORMAL LOW (ref >60)
GFR, Estimated: 17 mL/min — ABNORMAL LOW (ref >60)
Glucose, Bld: 98 mg/dL (ref 70–99)
Glucose, Bld: 98 mg/dL (ref 70–99)
Potassium: 4.1 mmol/L (ref 3.5–5.1)
Potassium: 4.3 mmol/L (ref 3.5–5.1)
Sodium: 130 mmol/L — ABNORMAL LOW (ref 135–145)
Sodium: 131 mmol/L — ABNORMAL LOW (ref 135–145)

## 2022-05-29 LAB — RESPIRATORY PANEL BY PCR

## 2022-05-29 LAB — PROTEIN / CREATININE RATIO, URINE
Creatinine, Urine: 72 mg/dL
Protein Creatinine Ratio: 0.5 mg/mg{Cre} — ABNORMAL HIGH (ref 0.00–0.15)
Total Protein, Urine: 36 mg/dL

## 2022-05-29 LAB — HEPARIN LEVEL (UNFRACTIONATED): Heparin Unfractionated: 0.46 IU/mL (ref 0.30–0.70)

## 2022-05-29 LAB — MAGNESIUM: Magnesium: 1.8 mg/dL (ref 1.7–2.4)

## 2022-05-29 MED ORDER — METOPROLOL TARTRATE 25 MG PO TABS
25.0000 mg | ORAL_TABLET | Freq: Four times a day (QID) | ORAL | Status: DC
  Administered 2022-05-29 – 2022-05-31 (×8): 25 mg via ORAL
  Filled 2022-05-29 (×11): qty 1

## 2022-05-29 MED ORDER — IPRATROPIUM-ALBUTEROL 0.5-2.5 (3) MG/3ML IN SOLN
3.0000 mL | Freq: Three times a day (TID) | RESPIRATORY_TRACT | Status: DC
  Administered 2022-05-29 – 2022-05-30 (×2): 3 mL via RESPIRATORY_TRACT
  Filled 2022-05-29 (×2): qty 3

## 2022-05-29 MED ORDER — BRIMONIDINE TARTRATE 0.2 % OP SOLN
1.0000 [drp] | Freq: Two times a day (BID) | OPHTHALMIC | Status: DC
  Administered 2022-05-29 – 2022-06-04 (×13): 1 [drp] via OPHTHALMIC
  Filled 2022-05-29: qty 5

## 2022-05-29 MED ORDER — TIMOLOL MALEATE 0.5 % OP SOLN
1.0000 [drp] | Freq: Two times a day (BID) | OPHTHALMIC | Status: DC
  Administered 2022-05-29 – 2022-06-04 (×13): 1 [drp] via OPHTHALMIC
  Filled 2022-05-29: qty 5

## 2022-05-29 NOTE — Consult Note (Signed)
[place] for heparin Indication: chest pain/ACS  Allergies  Allergen Reactions   Augmentin [Amoxicillin-Pot Clavulanate] Nausea Only    Did it involve swelling of the face/tongue/throat, SOB, or low BP? No Did it involve sudden or severe rash/hives, skin peeling, or any reaction on the inside of your mouth or nose? No Did you need to seek medical attention at a hospital or doctor's office? No When did it last happen? More than 10 years If all above answers are "NO", may proceed with cephalosporin use.    Celebrex [Celecoxib] Nausea Only   Oxycodone Other (See Comments)    Confusion, makes him "crazy"    Patient Measurements: Height: 5' 11"$  (180.3 cm) Weight: 101.5 kg (223 lb 12.3 oz) IBW/kg (Calculated) : 75.3 Heparin Dosing Weight: 97.7 kg  Vital Signs: Temp: 98.2 F (36.8 C) (02/22 0302) Temp Source: Oral (02/22 0302) BP: 122/59 (02/22 0302) Pulse Rate: 91 (02/22 0302)  Labs: Recent Labs    05/28/22 1009 05/28/22 1207 05/28/22 1510 05/28/22 2015 05/29/22 0618  HGB 11.7*  --   --   --  10.3*  HCT 34.6*  --   --   --  30.1*  PLT 259  --   --   --  220  APTT 37*  --   --   --   --   LABPROT 14.4  --   --   --   --   INR 1.1  --   --   --   --   HEPARINUNFRC  --   --   --  0.32 0.46  CREATININE 4.20* 4.04*  --  4.01* 3.71*  TROPONINIHS 2,196* 1,998* 1,810*  --   --      Estimated Creatinine Clearance: 19.9 mL/min (A) (by C-G formula based on SCr of 3.71 mg/dL (H)).   Medical History: Past Medical History:  Diagnosis Date   Anxiety    Cancer ([place])    non hodgins  lymphoma   2004 in remission  Left arm  wears a brace there is a bone broken   CKD (chronic kidney disease) stage 3, GFR 30-59 ml/min (HCC)    COPD (chronic obstructive pulmonary disease) (HCC)    Depression    Dysrhythmia    SVT   GERD (gastroesophageal reflux disease)    History of hiatal hernia    Hypertension    Pneumonia    Primary localized  osteoarthritis of knee 09/26/2014   Primary localized osteoarthrosis, shoulder region, rotator cuff arthropathy 10/04/2013   Sleep apnea    cpap    Medications:  No evidence of PTA anticoagulants  Assessment: Pharmacy consulted to dose heparin in this 5 to M presenting to the ED with ACS/STEMI.  CrCl 18, Troponin 2196  Baseline Labs: aPTT 37, INR 1.1, Hgb 11.7, Plts 259   Goal of Therapy:  Heparin level 0.3-0.7 units/ml Monitor platelets by anticoagulation protocol: Yes   Plan: heparin level remains therapeutic now x 2 continue heparin infusion at 1200 units/hr check anti-Xa level once daily with am labs: next 05/30/22 Continue to monitor H&H and platelets  Dallie Piles, PharmD, BCPS Clinical Pharmacist   05/29/2022,7:08 AM

## 2022-05-29 NOTE — TOC Initial Note (Signed)
Transition of Care [facility]) - Initial/Assessment Note    Patient Details  Name: Gavin Maxwell MRN: [id number] Date of Birth: 09/25/1945  Transition of [facility]) CM/SW Contact:    Candie Chroman, LCSW Phone Number: 05/29/2022, 11:23 AM  Clinical Narrative: Readmission prevention screen complete. CSW met with patient. No supports at bedside. CSW introduced role and explained that discharge planning would be discussed. PCP is Fulton Reek, MD. Wife transports him to appointments. Pharmacy is Paediatric nurse on [address]. No issues obtaining medications. Patient lives home with wife. No home health prior to admission. Patient uses a SPC to get around at home. He also has a shower chair but does not use it. He is currently on acute oxygen. No further concerns. CSW encouraged patient to contact CSW as needed. CSW will continue to follow patient for support and facilitate return home once stable. Wife will transport him home at discharge.                 Expected Discharge Plan: Home/Self Care Barriers to Discharge: Continued Medical Work up   Patient Goals and CMS Choice            Expected Discharge Plan and Services     Post Acute Care Choice: NA Living arrangements for the past 2 months: Single Family Home                                      Prior Living Arrangements/Services Living arrangements for the past 2 months: Single Family Home Lives with:: Spouse Patient language and need for interpreter reviewed:: Yes Do you feel safe going back to the place where you live?: Yes      Need for Family Participation in Patient Care: Yes (Comment) Care giver support system in place?: Yes (comment) Current home services: DME Criminal Activity/Legal Involvement Pertinent to Current Situation/Hospitalization: No - Comment as needed  Activities of Daily Living Home Assistive Devices/Equipment: CPAP, Oxygen, Walker (specify type) ADL Screening (condition at time of  admission) Patient's cognitive ability adequate to safely complete daily activities?: Yes Is the patient deaf or have difficulty hearing?: No Does the patient have difficulty seeing, even when wearing glasses/contacts?: No Does the patient have difficulty concentrating, remembering, or making decisions?: No Patient able to express need for assistance with ADLs?: Yes Does the patient have difficulty dressing or bathing?: No Independently performs ADLs?: Yes (appropriate for developmental age) Does the patient have difficulty walking or climbing stairs?: Yes Weakness of Legs: Both Weakness of Arms/Hands: Both  Permission Sought/Granted                  Emotional Assessment Appearance:: Appears stated age Attitude/Demeanor/Rapport: Engaged, Gracious Affect (typically observed): Accepting, Appropriate, Calm, Pleasant Orientation: : Oriented to Self, Oriented to Place, Oriented to  Time, Oriented to Situation Alcohol / Substance Use: Not Applicable Psych Involvement: No (comment)  Admission diagnosis:  NSTEMI (non-ST elevated myocardial infarction) ([place]) [I21.4] Atrial fibrillation with rapid ventricular response (HCC) [I48.91] Acute renal failure superimposed on chronic kidney disease, unspecified acute renal failure type, unspecified CKD stage ([place]) [N17.9, N18.9] Patient Active Problem List   Diagnosis Date Noted   NSTEMI (non-ST elevated myocardial infarction) ([place]) 05/28/2022   Atrial fibrillation with RVR ([place]) [id number]   Acute diastolic congestive heart failure ([place]) 05/28/2022   COPD with acute exacerbation ([place]) 05/28/2022   Depression with anxiety 05/28/2022   Obesity with body mass  index of 30.0-39.9 05/28/2022   Acute renal failure superimposed on stage 3a chronic kidney disease ([place]) 05/28/2022   Diarrhea 05/28/2022   Abnormal LFTs 05/28/2022   Hyponatremia 05/28/2022   Iron deficiency anemia 05/28/2022   Radiculopathy 06/05/2021   Osteoarthritis of right hip  07/26/2019   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   Hypomagnesemia 12/27/2018   SVT (supraventricular tachycardia) 12/13/2018   GERD (gastroesophageal reflux disease) 08/24/2017   Hyperlipidemia 08/24/2017   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[place]) 08/24/2017   Primary spontaneous pneumothorax    Pneumothorax on right 08/11/2017   Essential hypertension 04/14/2017   Neurocardiogenic pre-syncope 03/27/2017   Stiffness of hip joint 01/14/2016   Chronic midline low back pain without sciatica 12/17/2015   CKD (chronic kidney disease) stage 3, GFR 30-59 ml/min (HCC) 12/17/2015   Primary localized osteoarthritis of knee 09/26/2014   Knee osteoarthritis 09/26/2014   Primary localized osteoarthrosis, shoulder region, rotator cuff arthropathy 10/04/2013   Chronic anemia 10/03/2013   Renal insufficiency 10/03/2013   PCP:  Idelle Crouch, MD Pharmacy:   [facility] ORDER) [place], [place] [address] Phone: [phone] Fax: [place] ([address]), [place])  [id number] Phone: [phone] Fax: [phone]     Social Determinants of Health (SDOH) Social History: SDOH Screenings   Food Insecurity: No Food Insecurity (05/28/2022)  Housing: Low Risk  (05/28/2022)  Transportation Needs: No Transportation Needs (05/28/2022)  Utilities: Not At Risk (05/28/2022)  Tobacco Use: Medium Risk (05/28/2022)   SDOH Interventions:     Readmission Risk Interventions    05/29/2022   11:22 AM  Readmission Risk Prevention Plan  Transportation Screening Complete  PCP or Specialist Appt within 3-5 Days Complete  Social Work Consult for [place] Planning/Counseling Complete  Palliative Care Screening Not Applicable  Medication Review Press photographer) Complete

## 2022-05-29 NOTE — Progress Notes (Signed)
PROGRESS NOTE    Gavin Maxwell  [id number] DOB: 1946/01/14 DOA: 05/28/2022 PCP: Idelle Crouch, MD  [id number]-[id number]  LOS: 1 day   Brief hospital course:   Assessment & Plan: Gavin Maxwell is a 77 y.o. male with medical history significant of HTN, HLD, COPD not on oxygen at baseline, depression with anxiety, CKD 3A, anemia, hiatal hernia, OSA on CPAP, NHL in remission, SVT, who presents with shortness breath and diarrhea.   Patient states that has SOB for about 5 day, which has been progressively worsening.  Patient was seen by PCP on 2/19, and diagnosed with COPD exacerbation.  Patient given prescription for doxycycline and prednisone, without significant improvement.   Dyspnea and cough --likely viral illness.  Resp panel neg. --obtain RVP  NSTEMI (non-ST elevated myocardial infarction) [facility]):   --In the setting of respiratory failure, viral syndrome, atrial fibrillation with RVR, renal failure --Troponin 2100, trending downward Normal LV function with no regional wall motion abnormality on echo --started on heparin gtt.  Cardiology consulted.  Not a good candidate for cardiac catheterization given renal failure. Plan: --cont heparin gtt --Ischemic workup deferred for now.  Could consider Myoview versus cardiac catheterization if renal function improves back to baseline creatinine 1.8   Chronic diastolic congestive heart failure ([place]) Ruled out acute exacerbation   COPD with acute exacerbation [facility]):  Currently patient does not have wheezing. -continue outpatient prescribed prednisone and doxycycline -Bronchodilators   Atrial fibrillation with RVR ([place]):  --new onset.  CHADS2 score is 5 (hypertension, age 14, CHF, CAD).  --cont Lopressor 25 mg q6h --cont heparin gtt, plan to discharge on Eliquis   Essential hypertension --BP varied, sometimes hypotensive --cont Lopressor 25 mg q6h for rate control   Hyperlipidemia -Lipitor and fenofibrate   Acute renal  failure superimposed on stage 3b chronic kidney disease ([place]):  --Baseline creatinine 1.8, presenting creatinine 4.2.  Likely due to dehydration secondary to diarrhea -Patient received 1 L normal saline bolus in the ED --nephro consulted Plan: -hold Mobic  Diarrhea -Check C. difficile and GI pathogen panel   Abnormal LFTs: Likely due to liver congestion secondary to CHF -Check hepatitis panel   Hyponatremia:  Sodium 127, mental status normal.  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  Has been coughing a lot.  Diarrhea improved.   Objective: Vitals:   05/29/22 0500 05/29/22 0823 05/29/22 1130 05/29/22 1647  BP:  (!) 145/59 (!) 120/59 (!) 87/65  Pulse:  75 84 97  Resp:  18 16 16  $ Temp:  97.9 F (36.6 C) (!) 97.5 F (36.4 C) 98.3 F (36.8 C)  TempSrc:   Oral   SpO2:  100% (!) 68% 100%  Weight: 101.5 kg     Height:        Intake/Output Summary (Last 24 hours) at 05/29/2022 1738 Last data filed at 05/29/2022 1600 Gross per 24 hour  Intake 342.81 ml  Output 1400 ml  Net -1057.19 ml   [employer]  05/28/22 1003 05/29/22 0500  Weight: 106 kg 101.5 kg    Examination:   Constitutional: NAD, AAOx3 HEENT: conjunctivae and lids normal, EOMI CV: No cyanosis.   RESP: frequent dry coughs, no wheezing. Neuro: II - XII grossly intact.   Psych: Normal mood and affect.  Appropriate judgement and reason   Data Reviewed: I have personally reviewed  labs and imaging studies  Time spent: 50 minutes  Gavin Bi, MD [facility] If 7PM-7AM, please contact night-coverage 05/29/2022, 5:38 PM

## 2022-05-29 NOTE — Discharge Instructions (Signed)

## 2022-05-29 NOTE — Progress Notes (Signed)
*  PRELIMINARY RESULTS* Echocardiogram 2D Echocardiogram has been performed.  Gavin Maxwell 05/29/2022, 8:04 AM

## 2022-05-29 NOTE — TOC Benefit Eligibility Note (Signed)
Patient Teacher, English as a foreign language completed.    The patient is currently admitted and upon discharge could be taking Eliquis 5 mg.  The current 30 day co-pay is $47.00.   The patient is insured through [place] Patient Advocate Specialist [place] Patient Advocate Team Direct Number: [phone]  Fax: [phone]

## 2022-05-29 NOTE — Progress Notes (Signed)
Cardiology Progress Note   Patient Name: Gavin Maxwell Date of Encounter: 05/29/2022  Primary Cardiologist: Ida Rogue, MD  Subjective   Overall breathing improving.  No chest pain or palpitations.  Heart rates slightly higher this morning  Inpatient Medications    Scheduled Meds:  aspirin EC  81 mg Oral Daily   atorvastatin  80 mg Oral Daily   brimonidine-timolol  1 drop Both Eyes Q12H   calcium-vitamin D  1 tablet Oral Daily   doxycycline  100 mg Oral BID   fenofibrate  160 mg Oral Daily   ferrous sulfate  325 mg Oral Daily   fluticasone  2 spray Each Nare Daily   gabapentin  100 mg Oral TID   imipramine  25 mg Oral QHS   ipratropium-albuterol  3 mL Nebulization QID   latanoprost  1 drop Both Eyes QHS   magnesium oxide  400 mg Oral TID   metoprolol tartrate  12.5 mg Oral BID   mometasone-formoterol  2 puff Inhalation BID   multivitamin with minerals  1 tablet Oral Daily   pantoprazole  40 mg Oral Daily   polycarbophil  625 mg Oral BID   predniSONE  20 mg Oral Daily   sertraline  50 mg Oral QHS   sodium chloride  1 g Oral BID WC   Continuous Infusions:  heparin Stopped (05/29/22 0737)   PRN Meds: albuterol, dextromethorphan-guaiFENesin, fentaNYL (SUBLIMAZE) injection, hydrALAZINE, nitroGLYCERIN, ondansetron (ZOFRAN) IV   Vital Signs    Vitals:   05/28/22 2017 05/29/22 0302 05/29/22 0500 05/29/22 0823  BP:  (!) 122/59  (!) 145/59  Pulse:  91  75  Resp:  18  18  Temp:  98.2 F (36.8 C)  97.9 F (36.6 C)  TempSrc:  Oral    SpO2: 98% 97%  100%  Weight:   101.5 kg   Height:        Intake/Output Summary (Last 24 hours) at 05/29/2022 1108 Last data filed at 05/29/2022 [id number] Gross per 24 hour  Intake 1618.54 ml  Output 1640 ml  Net -21.46 ml   Filed Weights   05/28/22 1003 05/29/22 0500  Weight: 106 kg 101.5 kg    Physical Exam   GEN: Obese, in no acute distress.  HEENT: Grossly normal.  Neck: Supple, obese, difficult to gauge JVP.  No bruits  or masses.  Cardiac: Irregularly irregular, tachycardic, no murmurs, rubs, or gallops. No clubbing, cyanosis, edema.  Radials 2+, DP/PT 2+ and equal bilaterally.  Respiratory:  Respirations regular and unlabored, clear to auscultation bilaterally. GI: Obese, soft, nontender, nondistended, BS + x 4. MS: no deformity or atrophy. Skin: warm and dry, no rash. Neuro:  Strength and sensation are intact. Psych: AAOx3.  Normal affect.  Labs    Chemistry Recent Labs  Lab 05/28/22 1009 05/28/22 1207 05/28/22 2015 05/29/22 0618 05/29/22 0957  NA 127*   < > 127* 130* 131*  K 3.9   < > 4.1 4.1 4.3  CL 96*   < > 98 99 98  CO2 19*   < > 16* 20* 21*  GLUCOSE 121*   < > 153* 98 105*  BUN 82*   < > 82* 85* 85*  CREATININE 4.20*   < > 4.01* 3.71* 3.75*  CALCIUM 8.5*   < > 7.8* 8.1* 8.4*  PROT 6.7  --   --   --  6.3*  ALBUMIN 2.9*  --   --   --  2.6*  AST 128*  --   --   --  73*  ALT 56*  --   --   --  50*  ALKPHOS 44  --   --   --  46  BILITOT 0.8  --   --   --  0.9  GFRNONAA 14*   < > 15* 16* 16*  ANIONGAP 12   < > 13 11 12   $ < > = values in this interval not displayed.     Hematology Recent Labs  Lab 05/28/22 1009 05/29/22 0618  WBC 10.2 10.3  RBC 3.69* 3.25*  HGB 11.7* 10.3*  HCT 34.6* 30.1*  MCV 93.8 92.6  MCH 31.7 31.7  MCHC 33.8 34.2  RDW 13.2 13.2  PLT 259 220    Cardiac Enzymes  Recent Labs  Lab 05/28/22 1009 05/28/22 1207 05/28/22 1510  TROPONINIHS 2,196* 1,998* 1,810*      BNP    Component Value Date/Time   BNP 215.1 (H) 05/28/2022 1009   Lipids  Lab Results  Component Value Date   CHOL 69 05/29/2022   HDL 11 (L) 05/29/2022   LDLCALC 27 05/29/2022   TRIG 154 (H) 05/29/2022   CHOLHDL 6.3 05/29/2022    HbA1c  Lab Results  Component Value Date   HGBA1C 5.2 05/28/2022    Radiology    US RENAL  Result Date: 05/28/2022 CLINICAL DATA:  Acute kidney injury in a 77 year old male. EXAM: RENAL / URINARY TRACT ULTRASOUND COMPLETE COMPARISON:  CT  imaging from January 2021. FINDINGS: Right Kidney: Renal measurements: 8.5 x 5.1 x 5.2 cm = volume: 119 mL. Small cyst in the RIGHT kidney measuring 1.7 x 1.3 x 1.2 cm. Mild parenchymal scarring of the RIGHT kidney. No hydronephrosis. Increased cortical echogenicity on the RIGHT. Left Kidney: Renal measurements: 9.8 x 5.2 x 5.5 cm = volume: 146 mL. Cysts in the RIGHT kidney largest 4.7 x 3.7 x 3.3 cm arising from the upper pole. No hydronephrosis. Mild increased cortical echogenicity. Trace perinephric fluid or stranding. Bladder: Appears normal for degree of bladder distention. Other: Increased echogenicity is subcutaneous and retroperitoneal fat could be seen in the setting of diffuse edema. IMPRESSION: Signs of medical renal disease and renal cysts.  No hydronephrosis. Question of anasarca. Trace perinephric fluid suggested on the LEFT is nonfocal in appearance and can be seen in the setting of anasarca, renal dysfunction or infection. Correlation with urinalysis may also be helpful. Electronically Signed   By: Zetta Bills M.D.   On: 05/28/2022 14:06   DG Chest 1 View  Result Date: 05/28/2022 CLINICAL DATA:  Shortness of breath EXAM: CHEST  1 VIEW COMPARISON:  CXR 05/26/22 FINDINGS: New bilateral pleural effusions. No pneumothorax. Unchanged cardiac and mediastinal contours. No focal airspace opacity. There are prominent bilateral interstitial markings, which could represent pulmonary venous congestion. No radiographically apparent displaced rib fracture. Unchanged appearance of the bilateral glenohumeral joints, only partially imaged on the right. Visualized upper abdomen is unremarkable. IMPRESSION: 1. New bilateral pleural effusions. 2. Prominent bilateral interstitial markings, which could represent pulmonary venous congestion. 3. No focal airspace opacity. Electronically Signed   By: Marin Roberts M.D.   On: 05/28/2022 10:57   Telemetry    Afib up to 130's  - Personally Reviewed  Cardiac Studies    2D Echocardiogram 2.22.2024  1. Left ventricular ejection fraction, by estimation, is 60 to 65%. The  left ventricle has normal function. The left ventricle has no regional  wall motion abnormalities. There is moderate concentric left ventricular  hypertrophy. Left ventricular  diastolic parameters are  indeterminate.   2. Right ventricular systolic function is normal. The right ventricular  size is normal.   3. The mitral valve is normal in structure. Mild mitral valve  regurgitation. No evidence of mitral stenosis.   4. The aortic valve is normal in structure. Aortic valve regurgitation is  not visualized. Aortic valve sclerosis/calcification is present, without  any evidence of aortic stenosis.   5. The inferior vena cava is normal in size with greater than 50%  respiratory variability, suggesting right atrial pressure of 3 mmHg.  Patient Profile     77 y.o. 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  Assessment & Plan    1.  Acute respiratory failure: Patient with progressive dyspnea, cough, malaise, and myalgias over the course of last weekend with evaluation at urgent care and subsequently primary care.  Viral panel on February 20 was negative for flu, RSV, and COVID.  He was being treated with steroids and doxycycline as an outpatient and was ultimately admitted due to acute kidney injury noted on outpatient lab work.  Breathing has improved with steroids, nebulizers, and antibiotics.  Echo with normal LV function.  Question potential role for mild volume excess in the setting of rapid A-fib.   Withholding diuretic therapy in the setting of acute kidney injury.  2.  Atrial fibrillation with rapid ventricular response: Noted on presentation of unknown duration.  Heart rate recorded at 114 bpm at urgent care visit February 19 (exam states normal rate and regular rhythm).  Heart rate not recorded at February 20 primary care visit though notes indicate that he had a regular rate and rhythm on exam.  Heart rate was 123 on presentation to the ED on February 21, with subsequent ECG showing atrial fibrillation.  He remains in atrial fibrillation with rates sometime into the 130s.  Overall, he is feeling better.  He has been hemodynamically stable.  Will titrate beta-blocker to 25 mg every 6 hours.  He remains anticoagulated with heparin, and we will need to consider cardioversion once respiratory status improves.  Follow-up creatinine with likely plan to transition to oral Eliquis prior to discharge.  3.  Non-STEMI: In the setting of respiratory failure, rapid atrial fibrillation, and acute kidney injury, with creatinine up to 4.20 on admission, troponins-[id number] on admission with slow downward trend since (1810 yesterday afternoon).  He denies chest pain.  Echo shows normal LV function without regional wall motion abnormalities.  He has no prior history of CAD, though CT chest in February 2022 does show diffuse coronary calcifications and aortic atherosclerosis.  Continue aspirin, statin, beta-blocker, and heparin.  In the setting of acute kidney injury and respiratory failure, he is a poor candidate for ischemic evaluation/catheterization at this time.  Baseline creatinine is typically approximately 1.8, and if he returns to baseline, we can consider diagnostic catheterization following recovery.  4.  Essential hypertension: Stable.  Following beta-blocker therapy.  5.  Hyperlipidemia: Continue atorvastatin and fenofibrate.  LDL of 27 with triglycerides of 154.  LFTs trending down since admission.  6.   History of PSVT: Status post prior catheter ablation.  On beta-blocker as above.  7.  Obstructive sleep apnea: Uses CPAP at  night.  Encouraged to bring home unit as he had difficult fit with hospital-based unit.  8.  Acute on chronic stage III kidney disease: Baseline creatinine approximately 1.8.  4.20 on admission in the setting of poor p.o. intake and malaise prior to admission.  Nephrology following.  Avoiding nephrotoxic agents.  Creatinine slowly improving and 3.75 this morning.  9.  Hyponatremia: Stable at 131.  Signed, Murray Hodgkins, NP  05/29/2022, 11:08 AM    For questions or updates, please contact   Please consult [email or web address] for contact info under Cardiology/STEMI.

## 2022-05-29 NOTE — Consult Note (Signed)
[facility]  CONSULT NOTE    Date: 05/29/2022                  Patient Name:  Gavin Maxwell  MRN: [id number]  DOB: Mar 06, 1946  Age / Sex: 77 y.o., male         PCP: Idelle Crouch, MD                 Service Requesting Consult: [facility]                 Reason for Consult: Acute kidney injury            History of Present Illness: Gavin Maxwell is a 77 y.o.  male with past medical conditions including hyperlipidemia, COPD, hypertension, anemia, depression and anxiety, obstructive sleep apnea with CPAP, and chronic kidney disease stage IIIa, who was admitted to [facility] on 05/28/2022 for NSTEMI (non-ST elevated myocardial infarction) ([address]) [I21.4] Atrial fibrillation with rapid ventricular response ([place]) [I48.91] Acute renal failure superimposed on chronic kidney disease, unspecified acute renal failure type, unspecified CKD stage ([place]) [N17.9, N18.9]  Patient presents to the emergency department with shortness of breath and diarrhea.  Patient is seen sitting up in bed, wife at bedside.  States he may have followed with nephrology years ago.  Shortness of breath began 5 days prior and have progressively worsened. Endorses dry cough with shortness of breath. Denies shortness of breath and chest pain. Denies NSAID use.   Labs on ED arrival include sodium 127, bicarb 19, glucose 121, BUN 82, creatinine 4.20 with GFR 14, albumin 2.9, troponin 2100, and hemoglobin 11.7.  UA appears hazy with some bacteria.  Renal ultrasound negative for hydronephrosis.  Recent echo shows EF 60 to 65% with moderate LVH and mild mitral valve regurgitation.   Medications: Outpatient medications: Medications Prior to Admission  Medication Sig Dispense Refill Last Dose   acetaminophen (TYLENOL) 500 MG tablet Take 1,000 mg by mouth 2 (two) times daily as needed for moderate pain.      albuterol (PROVENTIL HFA;VENTOLIN HFA) 108 (90 Base) MCG/ACT inhaler Inhale 2 puffs into the lungs every 6  (six) hours as needed for wheezing or shortness of breath. 1 Inhaler 3 Past Week   aspirin EC 81 MG tablet Take 1 tablet (81 mg total) by mouth daily. Swallow whole. 90 tablet 3 05/28/2022   atorvastatin (LIPITOR) 80 MG tablet Take 1 tablet (80 mg total) by mouth daily. 90 tablet 3 05/28/2022   azithromycin (ZITHROMAX) 250 MG tablet Take 250 mg by mouth daily.   05/28/2022   brimonidine-timolol (COMBIGAN) 0.2-0.5 % ophthalmic solution Place 1 drop into both eyes every 12 (twelve) hours.   05/28/2022   Calcium Carb-Cholecalciferol (CALCIUM 600 + D PO) Take 1 tablet by mouth daily.   05/28/2022   doxycycline (VIBRAMYCIN) 100 MG capsule Take 1 capsule (100 mg total) by mouth 2 (two) times daily for 7 days. 14 capsule 0 05/28/2022   fenofibrate 160 MG tablet Take 160 mg by mouth daily.    05/28/2022   ferrous sulfate 325 (65 FE) MG tablet Take 325 mg by mouth daily.   05/28/2022   fluticasone (FLONASE) 50 MCG/ACT nasal spray Place 2 sprays into both nostrils daily.    05/28/2022   fluticasone-salmeterol (ADVAIR) 100-50 MCG/ACT AEPB Inhale 1 puff into the lungs 2 (two) times daily.   05/28/2022   gabapentin (NEURONTIN) 300 MG capsule Take 300 mg by mouth 3 (three) times daily.  05/28/2022   imipramine (TOFRANIL) 25 MG tablet Take 25 mg by mouth at bedtime.   05/28/2022   LUMIGAN 0.01 % SOLN Place 1 drop into both eyes at bedtime.   05/27/2022   magnesium oxide (MAG-OX) 400 MG tablet Take 400 mg by mouth 3 (three) times daily.   05/28/2022   meloxicam (MOBIC) 15 MG tablet Take 15 mg by mouth daily.   05/27/2022   metoprolol succinate (TOPROL-XL) 25 MG 24 hr tablet Take 1 tablet (25 mg total) by mouth every evening. 90 tablet 3 05/27/2022   Multiple Vitamin (MULTIVITAMIN WITH MINERALS) TABS tablet Take 1 tablet by mouth daily.   05/28/2022   pantoprazole (PROTONIX) 40 MG tablet Take 40 mg by mouth daily.    05/28/2022   polycarbophil (FIBERCON) 625 MG tablet Take 625 mg by mouth 2 (two) times daily.   05/28/2022    predniSONE (DELTASONE) 10 MG tablet Take 2 tablets (20 mg total) by mouth daily for 5 days. 10 tablet 0 05/28/2022   sertraline (ZOLOFT) 50 MG tablet Take 50 mg by mouth at bedtime.   05/27/2022   SYMBICORT 80-4.5 MCG/ACT inhaler Inhale 2 puffs into the lungs daily.   05/28/2022   HYDROcodone-acetaminophen (NORCO/VICODIN) 5-325 MG tablet Take 1-2 tablets by mouth every 4 (four) hours as needed for moderate pain or severe pain ((score 4 to 6)). (Patient not taking: Reported on 05/28/2022) 30 tablet 0 Completed Course   methocarbamol (ROBAXIN) 500 MG tablet Take 1 tablet (500 mg total) by mouth every 6 (six) hours as needed for muscle spasms. (Patient not taking: Reported on 05/28/2022) 30 tablet 2 Completed Course    Current medications: Current Facility-Administered Medications  Medication Dose Route Frequency Provider Last Rate Last Admin   albuterol (PROVENTIL) (2.5 MG/3ML) 0.083% nebulizer solution 2.5 mg  2.5 mg Nebulization Q4H PRN Ivor Costa, MD       aspirin EC tablet 81 mg  81 mg Oral Daily Ivor Costa, MD   81 mg at 05/29/22 1018   atorvastatin (LIPITOR) tablet 80 mg  80 mg Oral Daily Ivor Costa, MD   80 mg at 05/29/22 1018   brimonidine (ALPHAGAN) 0.2 % ophthalmic solution 1 drop  1 drop Both Eyes Q12H Enzo Bi, MD   1 drop at 05/29/22 1250   calcium-vitamin D (OSCAL WITH D) 500-5 MG-MCG per tablet 1 tablet  1 tablet Oral Daily Ivor Costa, MD   1 tablet at 05/29/22 1018   dextromethorphan-guaiFENesin ([place] DM) 30-600 MG per 12 hr tablet 1 tablet  1 tablet Oral BID PRN Ivor Costa, MD   1 tablet at 05/29/22 1018   doxycycline (VIBRA-TABS) tablet 100 mg  100 mg Oral BID Ivor Costa, MD   100 mg at 05/29/22 1018   fenofibrate tablet 160 mg  160 mg Oral Daily Ivor Costa, MD   160 mg at 05/29/22 1018   fentaNYL (SUBLIMAZE) injection 12.5 mcg  12.5 mcg Intravenous Q3H PRN Ivor Costa, MD       ferrous sulfate tablet 325 mg  325 mg Oral Daily Ivor Costa, MD   325 mg at 05/29/22 1018   fluticasone  (FLONASE) 50 MCG/ACT nasal spray 2 spray  2 spray Each Nare Daily Ivor Costa, MD   2 spray at 05/29/22 1020   gabapentin (NEURONTIN) capsule 100 mg  100 mg Oral TID Ivor Costa, MD   100 mg at 05/29/22 1018   heparin ADULT infusion 100 units/mL (25000 units/265m)  1,200 Units/hr Intravenous Continuous MAlison Murray  RPH 12 mL/hr at 05/29/22 1200 1,200 Units/hr at 05/29/22 1200   hydrALAZINE (APRESOLINE) injection 5 mg  5 mg Intravenous Q2H PRN Ivor Costa, MD       imipramine (TOFRANIL) tablet 25 mg  25 mg Oral QHS Ivor Costa, MD       ipratropium-albuterol (DUONEB) 0.5-2.5 (3) MG/3ML nebulizer solution 3 mL  3 mL Nebulization TID Enzo Bi, MD       latanoprost (XALATAN) 0.005 % ophthalmic solution 1 drop  1 drop Both Eyes QHS Ivor Costa, MD   1 drop at 05/28/22 2049   magnesium oxide (MAG-OX) tablet 400 mg  400 mg Oral TID Ivor Costa, MD   400 mg at 05/29/22 1018   metoprolol tartrate (LOPRESSOR) tablet 25 mg  25 mg Oral Q6H Theora Gianotti, NP       mometasone-formoterol (DULERA) 100-5 MCG/ACT inhaler 2 puff  2 puff Inhalation BID Ivor Costa, MD   2 puff at 05/29/22 0820   multivitamin with minerals tablet 1 tablet  1 tablet Oral Daily Ivor Costa, MD   1 tablet at 05/29/22 1018   nitroGLYCERIN (NITROSTAT) SL tablet 0.4 mg  0.4 mg Sublingual Q5 min PRN Ivor Costa, MD       ondansetron [facility]) injection 4 mg  4 mg Intravenous Q8H PRN Ivor Costa, MD       pantoprazole (PROTONIX) EC tablet 40 mg  40 mg Oral Daily Ivor Costa, MD   40 mg at 05/29/22 1020   polycarbophil (FIBERCON) tablet 625 mg  625 mg Oral BID Ivor Costa, MD   625 mg at 05/29/22 1018   predniSONE (DELTASONE) tablet 20 mg  20 mg Oral Daily Ivor Costa, MD   20 mg at 05/29/22 1018   sertraline (ZOLOFT) tablet 50 mg  50 mg Oral QHS Ivor Costa, MD   50 mg at 05/28/22 2047   sodium chloride tablet 1 g  1 g Oral BID WC Ivor Costa, MD   1 g at 05/29/22 0820   timolol (TIMOPTIC) 0.5 % ophthalmic solution 1 drop  1 drop Both Eyes  BID Enzo Bi, MD   1 drop at 05/29/22 1250      Allergies: Allergies  Allergen Reactions   Augmentin [Amoxicillin-Pot Clavulanate] Nausea Only    Did it involve swelling of the face/tongue/throat, SOB, or low BP? No Did it involve sudden or severe rash/hives, skin peeling, or any reaction on the inside of your mouth or nose? No Did you need to seek medical attention at a hospital or doctor's office? No When did it last happen? More than 10 years If all above answers are "NO", may proceed with cephalosporin use.    Celebrex [Celecoxib] Nausea Only   Oxycodone Other (See Comments)    Confusion, makes him "crazy"      Past Medical History: Past Medical History:  Diagnosis Date   Anxiety    Cancer ([place])    non hodgins  lymphoma   2004 in remission  Left arm  wears a brace there is a bone broken   CKD (chronic kidney disease) stage 3, GFR 30-59 ml/min (HCC)    COPD (chronic obstructive pulmonary disease) (HCC)    Depression    Dysrhythmia    SVT   GERD (gastroesophageal reflux disease)    History of hiatal hernia    Hypertension    Pneumonia    Primary localized osteoarthritis of knee 09/26/2014   Primary localized osteoarthrosis, shoulder region, rotator cuff arthropathy 10/04/2013  Sleep apnea    cpap     Past Surgical History: Past Surgical History:  Procedure Laterality Date   CARDIAC CATHETERIZATION     20 yrs. ago   CHOLECYSTECTOMY     EYE SURGERY Left    pt states he was "seeing double" and they did surgery to fix it   NASAL SINUS SURGERY     x2   REVERSE SHOULDER ARTHROPLASTY Right 10/04/2013   Procedure: REVERSE SHOULDER ARTHROPLASTY;  Surgeon: Johnny Bridge, MD;  Location: [place];  Service: Orthopedics;  Laterality: Right;   SHOULDER ACROMIOPLASTY Left    x 5 shoulder surgeries   SVT ABLATION N/A 03/07/2019   Procedure: SVT ABLATION;  Surgeon: Constance Haw, MD;  Location: [facility];  Service: Cardiovascular;  Laterality: N/A;   TOTAL  HIP ARTHROPLASTY Right 07/26/2019   Procedure: TOTAL HIP ARTHROPLASTY;  Surgeon: Marchia Bond, MD;  Location: [facility];  Service: Orthopedics;  Laterality: Right;   TOTAL HIP ARTHROPLASTY Left 1998   TOTAL KNEE ARTHROPLASTY Right 09/26/2014   Procedure: RIGHT TOTAL KNEE ARTHROPLASTY;  Surgeon: Marchia Bond, MD;  Location: [place];  Service: Orthopedics;  Laterality: Right;   TRANSFORAMINAL LUMBAR INTERBODY FUSION (TLIF) WITH PEDICLE SCREW FIXATION 2 LEVEL Right 06/05/2021   Procedure: RIGHT-SIDED LUMBAR 3- LUMBAR 4, LUMBAR 4- LUMBAR 5 TRANSFORAMINAL LUMBAR INTERBODY FUSION AND DECOMPRESSION WITH INSTRUMENTATION AND ALLOGRAFT;  Surgeon: Phylliss Bob, MD;  Location: [place];  Service: Orthopedics;  Laterality: Right;   VIDEO ASSISTED THORACOSCOPY (VATS) W/TALC PLEUADESIS Right 08/18/2017   Procedure: VIDEO ASSISTED THORACOSCOPY (VATS)POSSIBLE  W/TALC PLEUADESIS.POSSIBLE BLEBECTOMY;  Surgeon: Nestor Lewandowsky, MD;  Location: [facility];  Service: Thoracic;  Laterality: Right;     Family History: Family History  Problem Relation Age of Onset   Breast cancer Mother    Hypertension Mother    Rheum arthritis Father    Cancer Father    Lung cancer Brother    Stroke Paternal Grandfather      Social History: Social History   Socioeconomic History   Marital status: Married    Spouse name: Not on file   Number of children: 2   Years of education: Not on file   Highest education level: Not on file  Occupational History   Not on file  Tobacco Use   Smoking status: Former    Packs/day: 2.00    Years: 20.00    Total pack years: 40.00    Types: Cigarettes    Quit date: 1990    Years since quitting: 34.1   Smokeless tobacco: Never   Tobacco comments:    quit 25 years ago  Vaping Use   Vaping Use: Never used  Substance and Sexual Activity   Alcohol use: No   Drug use: No   Sexual activity: Not Currently    Birth control/protection: None  Other Topics Concern   Not on file  Social History  Narrative   Not on file   Social Determinants of Health   Financial Resource Strain: Not on file  Food Insecurity: No Food Insecurity (05/28/2022)   Hunger Vital Sign    Worried About Running Out of Food in the Last Year: Never true    Ran Out of Food in the Last Year: Never true  Transportation Needs: No Transportation Needs (05/28/2022)   PRAPARE - Hydrologist (Medical): No    Lack of Transportation (Non-Medical): No  Physical Activity: Not on file  Stress: Not on file  Social Connections: Not on file  Intimate Partner Violence: Not At Risk (05/28/2022)   Humiliation, Afraid, Rape, and Kick questionnaire    Fear of Current or Ex-Partner: No    Emotionally Abused: No    Physically Abused: No    Sexually Abused: No     Review of Systems: Review of Systems  Constitutional:  Negative for chills, fever and malaise/fatigue.  HENT:  Negative for congestion, sore throat and tinnitus.   Eyes:  Negative for blurred vision and redness.  Respiratory:  Positive for shortness of breath. Negative for cough and wheezing.   Cardiovascular:  Negative for chest pain, palpitations, claudication and leg swelling.  Gastrointestinal:  Positive for diarrhea. Negative for abdominal pain, blood in stool, nausea and vomiting.  Genitourinary:  Negative for flank pain, frequency and hematuria.  Musculoskeletal:  Negative for back pain, falls and myalgias.  Skin:  Negative for rash.  Neurological:  Negative for dizziness, weakness and headaches.  Endo/Heme/Allergies:  Does not bruise/bleed easily.  Psychiatric/Behavioral:  Negative for depression. The patient is not nervous/anxious and does not have insomnia.     Vital Signs: Blood pressure (!) 120/59, pulse 84, temperature (!) 97.5 F (36.4 C), temperature source Oral, resp. rate 16, height 5' 11"$  (1.803 m), weight 101.5 kg, SpO2 (!) 68 %.  Weight trends: Filed Weights   05/28/22 1003 05/29/22 0500  Weight: 106 kg  101.5 kg    Physical Exam: General: NAD  Head: Normocephalic, atraumatic. Moist oral mucosal membranes  Eyes: Anicteric  Lungs:  Clear to auscultation, normal effort  Heart: Regular rate and rhythm  Abdomen:  Soft, nontender  Extremities:  No peripheral edema.  Neurologic: Nonfocal, moving all four extremities  Skin: No lesions  Access: None     Lab results: Basic Metabolic Panel: Recent Labs  Lab 05/28/22 2015 05/29/22 0618 05/29/22 0957  NA 127* 130* 131*  K 4.1 4.1 4.3  CL 98 99 98  CO2 16* 20* 21*  GLUCOSE 153* 98 105*  BUN 82* 85* 85*  CREATININE 4.01* 3.71* 3.75*  CALCIUM 7.8* 8.1* 8.4*  MG  --  1.8  --     Liver Function Tests: Recent Labs  Lab 05/28/22 1009 05/29/22 0957  AST 128* 73*  ALT 56* 50*  ALKPHOS 44 46  BILITOT 0.8 0.9  PROT 6.7 6.3*  ALBUMIN 2.9* 2.6*   No results for input(s): "LIPASE", "AMYLASE" in the last 168 hours. No results for input(s): "AMMONIA" in the last 168 hours.  CBC: Recent Labs  Lab 05/28/22 1009 05/29/22 0618  WBC 10.2 10.3  NEUTROABS 8.5*  --   HGB 11.7* 10.3*  HCT 34.6* 30.1*  MCV 93.8 92.6  PLT 259 220    Cardiac Enzymes: No results for input(s): "CKTOTAL", "CKMB", "CKMBINDEX", "TROPONINI" in the last 168 hours.  BNP: Invalid input(s): "POCBNP"  CBG: No results for input(s): "GLUCAP" in the last 168 hours.  Microbiology: Results for orders placed or performed during the hospital encounter of 05/28/22  Resp panel by RT-PCR (RSV, Flu A&B, Covid)     Status: None   Collection Time: 05/29/22  9:54 AM  Result Value Ref Range Status   SARS Coronavirus 2 by RT PCR NEGATIVE NEGATIVE Final    Comment: (NOTE) SARS-CoV-2 target nucleic acids are NOT DETECTED.  The SARS-CoV-2 RNA is generally detectable in upper respiratory specimens during the acute phase of infection. The lowest concentration of SARS-CoV-2 viral copies this assay can detect is 138 copies/mL. A negative result does not  preclude  SARS-Cov-2 infection and should not be used as the sole basis for treatment or other patient management decisions. A negative result may occur with  improper specimen collection/handling, submission of specimen other than nasopharyngeal swab, presence of viral mutation(s) within the areas targeted by this assay, and inadequate number of viral copies(<138 copies/mL). A negative result must be combined with clinical observations, patient history, and epidemiological information. The expected result is Negative.  Fact Sheet for Patients:  EntrepreneurPulse.com.au  Fact Sheet for Healthcare Providers:  IncredibleEmployment.be  This test is no t yet approved or cleared by the [place] FDA and  has been authorized for detection and/or diagnosis of SARS-CoV-2 by FDA under an Emergency Use Authorization (EUA). This EUA will remain  in effect (meaning this test can be used) for the duration of the COVID-19 declaration under Section 564(b)(1) of the Act, 21 U.S.C.section 360bbb-3(b)(1), unless the authorization is terminated  or revoked sooner.       Influenza A by PCR NEGATIVE NEGATIVE Final   Influenza B by PCR NEGATIVE NEGATIVE Final    Comment: (NOTE) The Xpert Xpress SARS-CoV-2/FLU/RSV plus assay is intended as an aid in the diagnosis of influenza from Nasopharyngeal swab specimens and should not be used as a sole basis for treatment. Nasal washings and aspirates are unacceptable for Xpert Xpress SARS-CoV-2/FLU/RSV testing.  Fact Sheet for Patients: EntrepreneurPulse.com.au  Fact Sheet for Healthcare Providers: IncredibleEmployment.be  This test is not yet approved or cleared by the [place] FDA and has been authorized for detection and/or diagnosis of SARS-CoV-2 by FDA under an Emergency Use Authorization (EUA). This EUA will remain in effect (meaning this test can be used) for the duration of  the COVID-19 declaration under Section 564(b)(1) of the Act, 21 U.S.C. section 360bbb-3(b)(1), unless the authorization is terminated or revoked.     Resp Syncytial Virus by PCR NEGATIVE NEGATIVE Final    Comment: (NOTE) Fact Sheet for Patients: EntrepreneurPulse.com.au  Fact Sheet for Healthcare Providers: IncredibleEmployment.be  This test is not yet approved or cleared by the [place] FDA and has been authorized for detection and/or diagnosis of SARS-CoV-2 by FDA under an Emergency Use Authorization (EUA). This EUA will remain in effect (meaning this test can be used) for the duration of the COVID-19 declaration under Section 564(b)(1) of the Act, 21 U.S.C. section 360bbb-3(b)(1), unless the authorization is terminated or revoked.  Performed at [facility], [address] [place], [place] [address]     Coagulation Studies: Recent Labs    05/28/22 1009  LABPROT 14.4  INR 1.1    Urinalysis: Recent Labs    05/28/22 1518  COLORURINE YELLOW*  LABSPEC 1.018  PHURINE 5.0  GLUCOSEU NEGATIVE  HGBUR MODERATE*  BILIRUBINUR NEGATIVE  KETONESUR NEGATIVE  PROTEINUR 30*  NITRITE NEGATIVE  LEUKOCYTESUR NEGATIVE      Imaging: ECHOCARDIOGRAM COMPLETE  Result Date: 05/29/2022    ECHOCARDIOGRAM REPORT   Patient Name:   Gavin Maxwell Date of Exam: 05/29/2022 Medical Rec #:  [id number]       Height:       71.0 in Accession #:    [id number]      Weight:       223.8 lb Date of Birth:  [birth year]       BSA:          2.212 m Patient Age:    41 years        BP:  122/59 mmHg Patient Gender: M               HR:           91 bpm. Exam Location:  [facility] Procedure: 2D Echo, Cardiac Doppler and Color Doppler Indications:     CHF---acute diastolic [id number]  History:         Patient has no prior history of Echocardiogram examinations.                  COPD; Risk Factors:Hypertension and Sleep Apnea.  Sonographer:     Sherrie Sport Referring  Phys:  [place] Diagnosing Phys: [place]  1. Left ventricular ejection fraction, by estimation, is 60 to 65%. The left ventricle has normal function. The left ventricle has no regional wall motion abnormalities. There is moderate concentric left ventricular hypertrophy. Left ventricular diastolic parameters are indeterminate.  2. Right ventricular systolic function is normal. The right ventricular size is normal.  3. The mitral valve is normal in structure. Mild mitral valve regurgitation. No evidence of mitral stenosis.  4. The aortic valve is normal in structure. Aortic valve regurgitation is not visualized. Aortic valve sclerosis/calcification is present, without any evidence of aortic stenosis.  5. The inferior vena cava is normal in size with greater than 50% respiratory variability, suggesting right atrial pressure of 3 mmHg. FINDINGS  Left Ventricle: Left ventricular ejection fraction, by estimation, is 60 to 65%. The left ventricle has normal function. The left ventricle has no regional wall motion abnormalities. The left ventricular internal cavity size was normal in size. There is  moderate concentric left ventricular hypertrophy. Left ventricular diastolic parameters are indeterminate. Right Ventricle: The right ventricular size is normal. No increase in right ventricular wall thickness. Right ventricular systolic function is normal. Left Atrium: Left atrial size was normal in size. Right Atrium: Right atrial size was normal in size. Pericardium: There is no evidence of pericardial effusion. Mitral Valve: The mitral valve is normal in structure. Mild mitral valve regurgitation. No evidence of mitral valve stenosis. Tricuspid Valve: The tricuspid valve is normal in structure. Tricuspid valve regurgitation is trivial. No evidence of tricuspid stenosis. Aortic Valve: The aortic valve is normal in structure. Aortic valve regurgitation is not visualized. Aortic valve  sclerosis/calcification is present, without any evidence of aortic stenosis. Aortic valve mean gradient measures 2.0 mmHg. Aortic valve peak  gradient measures 3.5 mmHg. Aortic valve area, by VTI measures 2.92 cm. Pulmonic Valve: The pulmonic valve was normal in structure. Pulmonic valve regurgitation is not visualized. No evidence of pulmonic stenosis. Aorta: The aortic root is normal in size and structure. Venous: The inferior vena cava is normal in size with greater than 50% respiratory variability, suggesting right atrial pressure of 3 mmHg. IAS/Shunts: No atrial level shunt detected by color flow Doppler.  LEFT VENTRICLE PLAX 2D LVIDd:         4.50 cm LVIDs:         3.60 cm LV PW:         0.80 cm LV IVS:        1.20 cm LVOT diam:     2.20 cm LV SV:         49 LV SV Index:   22 LVOT Area:     3.80 cm  RIGHT VENTRICLE RV Basal diam:  3.20 cm RV Mid diam:    3.00 cm RV S prime:     11.60 cm/s TAPSE (M-mode): 2.2 cm LEFT ATRIUM  Index        RIGHT ATRIUM           Index LA diam:        3.00 cm 1.36 cm/m   RA Area:     16.10 cm LA Vol (A2C):   40.4 ml 18.27 ml/m  RA Volume:   36.70 ml  16.59 ml/m LA Vol (A4C):   51.5 ml 23.29 ml/m LA Biplane Vol: 48.1 ml 21.75 ml/m  AORTIC VALVE AV Area (Vmax):    3.15 cm AV Area (Vmean):   2.91 cm AV Area (VTI):     2.92 cm AV Vmax:           93.30 cm/s AV Vmean:          67.900 cm/s AV VTI:            0.168 m AV Peak Grad:      3.5 mmHg AV Mean Grad:      2.0 mmHg LVOT Vmax:         77.40 cm/s LVOT Vmean:        51.900 cm/s LVOT VTI:          0.129 m LVOT/AV VTI ratio: 0.77  AORTA Ao Root diam: 3.90 cm MITRAL VALVE               TRICUSPID VALVE MV Area (PHT): 4.49 cm    TR Peak grad:   19.4 mmHg MV Decel Time: 169 msec    TR Vmax:        220.00 cm/s MV E velocity: 85.40 cm/s                            SHUNTS                            Systemic VTI:  0.13 m                            Systemic Diam: 2.20 cm Neoma Laming Electronically signed by Neoma Laming  Signature Date/Time: 05/29/2022/9:48:56 AM    Final    US RENAL  Result Date: 05/28/2022 CLINICAL DATA:  Acute kidney injury in a 77 year old male. EXAM: RENAL / URINARY TRACT ULTRASOUND COMPLETE COMPARISON:  CT imaging from January 2021. FINDINGS: Right Kidney: Renal measurements: 8.5 x 5.1 x 5.2 cm = volume: 119 mL. Small cyst in the RIGHT kidney measuring 1.7 x 1.3 x 1.2 cm. Mild parenchymal scarring of the RIGHT kidney. No hydronephrosis. Increased cortical echogenicity on the RIGHT. Left Kidney: Renal measurements: 9.8 x 5.2 x 5.5 cm = volume: 146 mL. Cysts in the RIGHT kidney largest 4.7 x 3.7 x 3.3 cm arising from the upper pole. No hydronephrosis. Mild increased cortical echogenicity. Trace perinephric fluid or stranding. Bladder: Appears normal for degree of bladder distention. Other: Increased echogenicity is subcutaneous and retroperitoneal fat could be seen in the setting of diffuse edema. IMPRESSION: Signs of medical renal disease and renal cysts.  No hydronephrosis. Question of anasarca. Trace perinephric fluid suggested on the LEFT is nonfocal in appearance and can be seen in the setting of anasarca, renal dysfunction or infection. Correlation with urinalysis may also be helpful. Electronically Signed   By: Zetta Bills M.D.   On: 05/28/2022 14:06   DG Chest 1 View  Result Date: 05/28/2022 CLINICAL DATA:  Shortness of breath EXAM: CHEST  1 VIEW COMPARISON:  CXR 05/26/22 FINDINGS: New bilateral pleural effusions. No pneumothorax. Unchanged cardiac and mediastinal contours. No focal airspace opacity. There are prominent bilateral interstitial markings, which could represent pulmonary venous congestion. No radiographically apparent displaced rib fracture. Unchanged appearance of the bilateral glenohumeral joints, only partially imaged on the right. Visualized upper abdomen is unremarkable. IMPRESSION: 1. New bilateral pleural effusions. 2. Prominent bilateral interstitial markings, which could  represent pulmonary venous congestion. 3. No focal airspace opacity. Electronically Signed   By: Marin Roberts M.D.   On: 05/28/2022 10:57     Assessment & Plan: Gavin Maxwell is a 77 y.o.  male with past medical conditions including hyperlipidemia, COPD, hypertension, anemia, depression and anxiety, obstructive sleep apnea with CPAP, and chronic kidney disease stage IIIa, who was admitted to [facility] on 05/28/2022 for NSTEMI (non-ST elevated myocardial infarction) ([place]) [I21.4] Atrial fibrillation with rapid ventricular response ([place]) [I48.91] Acute renal failure superimposed on chronic kidney disease, unspecified acute renal failure type, unspecified CKD stage ([place]) [N17.9, N18.9]  Acute kidney injury on chronic kidney disease IIIa. Baseline creatinine 1.8 with GFR 38 on 02/22/23. Acute kidney injury likely secondary to hemodynamic instability. NSTEMI on admission. Will monitor renal function during this admission.   2. Anemia of chronic kidney disease Lab Results  Component Value Date   HGB 10.3 (L) 05/29/2022    Hdb acceptable.    3. Hypertension with chronic kidney disease. Home regimen includes metoprolol. Receiving this medication.    LOS: [place] 2/22/20241:33 PM

## 2022-05-30 DIAGNOSIS — N179 Acute kidney failure, unspecified: Secondary | ICD-10-CM | POA: Diagnosis not present

## 2022-05-30 DIAGNOSIS — I5031 Acute diastolic (congestive) heart failure: Secondary | ICD-10-CM | POA: Diagnosis not present

## 2022-05-30 DIAGNOSIS — I214 Non-ST elevation (NSTEMI) myocardial infarction: Secondary | ICD-10-CM | POA: Diagnosis not present

## 2022-05-30 DIAGNOSIS — I4891 Unspecified atrial fibrillation: Secondary | ICD-10-CM | POA: Diagnosis not present

## 2022-05-30 LAB — CBC
HCT: 31.5 % — ABNORMAL LOW (ref 39.0–52.0)
Hemoglobin: 10.5 g/dL — ABNORMAL LOW (ref 13.0–17.0)
MCH: 31.4 pg (ref 26.0–34.0)
MCHC: 33.3 g/dL (ref 30.0–36.0)
MCV: 94.3 fL (ref 80.0–100.0)
Platelets: 217 10*3/uL (ref 150–400)
RBC: 3.34 MIL/uL — ABNORMAL LOW (ref 4.22–5.81)
RDW: 13.4 % (ref 11.5–15.5)
WBC: 10.2 10*3/uL (ref 4.0–10.5)
nRBC: 0 % (ref 0.0–0.2)

## 2022-05-30 LAB — GASTROINTESTINAL PANEL BY PCR, STOOL (REPLACES STOOL CULTURE)

## 2022-05-30 LAB — IRON AND TIBC
Iron: 71 ug/dL (ref 45–182)
Saturation Ratios: 31 % (ref 17.9–39.5)
TIBC: 231 ug/dL — ABNORMAL LOW (ref 250–450)
UIBC: 160 ug/dL

## 2022-05-30 LAB — HEPATITIS PANEL, ACUTE
HCV Ab: NONREACTIVE
Hep A IgM: NONREACTIVE
Hep B C IgM: NONREACTIVE
Hepatitis B Surface Ag: NONREACTIVE

## 2022-05-30 LAB — FERRITIN: Ferritin: 308 ng/mL (ref 24–336)

## 2022-05-30 LAB — BASIC METABOLIC PANEL
Anion gap: 9 (ref 5–15)
BUN: 74 mg/dL — ABNORMAL HIGH (ref 8–23)
CO2: 20 mmol/L — ABNORMAL LOW (ref 22–32)
Calcium: 8.2 mg/dL — ABNORMAL LOW (ref 8.9–10.3)
Chloride: 103 mmol/L (ref 98–111)
Creatinine, Ser: 2.79 mg/dL — ABNORMAL HIGH (ref 0.61–1.24)
GFR, Estimated: 22 mL/min — ABNORMAL LOW (ref >60)
Glucose, Bld: 106 mg/dL — ABNORMAL HIGH (ref 70–99)
Potassium: 4 mmol/L (ref 3.5–5.1)
Sodium: 132 mmol/L — ABNORMAL LOW (ref 135–145)

## 2022-05-30 LAB — C DIFFICILE QUICK SCREEN W PCR REFLEX
C Diff antigen: NEGATIVE
C Diff interpretation: NOT DETECTED
C Diff toxin: NEGATIVE

## 2022-05-30 LAB — MAGNESIUM: Magnesium: 1.9 mg/dL (ref 1.7–2.4)

## 2022-05-30 LAB — HIV ANTIBODY (ROUTINE TESTING W REFLEX): HIV Screen 4th Generation wRfx: NONREACTIVE

## 2022-05-30 LAB — HEPARIN LEVEL (UNFRACTIONATED): Heparin Unfractionated: 0.36 IU/mL (ref 0.30–0.70)

## 2022-05-30 LAB — PHOSPHORUS: Phosphorus: 3 mg/dL (ref 2.5–4.6)

## 2022-05-30 MED ORDER — ACETYLCYSTEINE 20 % IN SOLN
4.0000 mL | Freq: Two times a day (BID) | RESPIRATORY_TRACT | Status: DC
  Administered 2022-05-31 – 2022-06-02 (×5): 4 mL via RESPIRATORY_TRACT
  Filled 2022-05-30 (×7): qty 4

## 2022-05-30 MED ORDER — IPRATROPIUM-ALBUTEROL 0.5-2.5 (3) MG/3ML IN SOLN
3.0000 mL | Freq: Two times a day (BID) | RESPIRATORY_TRACT | Status: DC
  Administered 2022-05-30 – 2022-06-01 (×4): 3 mL via RESPIRATORY_TRACT
  Filled 2022-05-30 (×4): qty 3

## 2022-05-30 MED ORDER — IPRATROPIUM-ALBUTEROL 0.5-2.5 (3) MG/3ML IN SOLN: 3.0000 mL | Freq: Four times a day (QID) | RESPIRATORY_TRACT | Status: DC | PRN

## 2022-05-30 NOTE — Progress Notes (Signed)
[facility]  ROUNDING NOTE   Subjective:   Patient seen sitting up in bed Alert and oriented Remains on 2L [place]  Appetite appropriate No lower extremity edema  Objective:  Vital signs in last 24 hours:  Temp:  [97.4 F (36.3 C)-98.6 F (37 C)] 98.4 F (36.9 C) (02/23 1110) Pulse Rate:  [82-105] 94 (02/23 1110) Resp:  [16-20] 18 (02/23 1110) BP: (87-104)/(53-72) 89/53 (02/23 1110) SpO2:  [82 %-100 %] 98 % (02/23 1110) Weight:  [99.3 kg] 99.3 kg (02/23 0404)  Weight change: -6.7 kg Filed Weights   05/28/22 1003 05/29/22 0500 05/30/22 0404  Weight: 106 kg 101.5 kg 99.3 kg    Intake/Output: I/O last 3 completed shifts: In: 477.3 [I.V.:477.3] Out: 1600 [Urine:1600]   Intake/Output this shift:  No intake/output data recorded.  Physical Exam: General: NAD  Head: Normocephalic, atraumatic. Moist oral mucosal membranes  Eyes: Anicteric,  Lungs:  Clear to auscultation, normal effort  Heart: Regular rate and rhythm  Abdomen:  Soft, nontender  Extremities:  trace peripheral edema.  Neurologic: Alert and oriented, moving all four extremities  Skin: No lesions  Access: None    Basic Metabolic Panel: Recent Labs  Lab 05/28/22 2015 05/29/22 0618 05/29/22 0957 05/29/22 1346 05/30/22 0630  NA 127* 130* 131* 131* 132*  K 4.1 4.1 4.3 4.3 4.0  CL 98 99 98 97* 103  CO2 16* 20* 21* 23 20*  GLUCOSE 153* 98 105* 98 106*  BUN 82* 85* 85* 84* 74*  CREATININE 4.01* 3.71* 3.75* 3.55* 2.79*  CALCIUM 7.8* 8.1* 8.4* 8.2* 8.2*  MG  --  1.8  --   --  1.9  PHOS  --   --   --   --  3.0    Liver Function Tests: Recent Labs  Lab 05/28/22 1009 05/29/22 0957  AST 128* 73*  ALT 56* 50*  ALKPHOS 44 46  BILITOT 0.8 0.9  PROT 6.7 6.3*  ALBUMIN 2.9* 2.6*   No results for input(s): "LIPASE", "AMYLASE" in the last 168 hours. No results for input(s): "AMMONIA" in the last 168 hours.  CBC: Recent Labs  Lab 05/28/22 1009 05/29/22 0618 05/30/22 0630  WBC 10.2 10.3  10.2  NEUTROABS 8.5*  --   --   HGB 11.7* 10.3* 10.5*  HCT 34.6* 30.1* 31.5*  MCV 93.8 92.6 94.3  PLT 259 220 217    Cardiac Enzymes: No results for input(s): "CKTOTAL", "CKMB", "CKMBINDEX", "TROPONINI" in the last 168 hours.  BNP: Invalid input(s): "POCBNP"  CBG: No results for input(s): "GLUCAP" in the last 168 hours.  Microbiology: Results for orders placed or performed during the hospital encounter of 05/28/22  Resp panel by RT-PCR (RSV, Flu A&B, Covid)     Status: None   Collection Time: 05/29/22  9:54 AM  Result Value Ref Range Status   SARS Coronavirus 2 by RT PCR NEGATIVE NEGATIVE Final    Comment: (NOTE) SARS-CoV-2 target nucleic acids are NOT DETECTED.  The SARS-CoV-2 RNA is generally detectable in upper respiratory specimens during the acute phase of infection. The lowest concentration of SARS-CoV-2 viral copies this assay can detect is 138 copies/mL. A negative result does not preclude SARS-Cov-2 infection and should not be used as the sole basis for treatment or other patient management decisions. A negative result may occur with  improper specimen collection/handling, submission of specimen other than nasopharyngeal swab, presence of viral mutation(s) within the areas targeted by this assay, and inadequate number of viral copies(<138 copies/mL). A  negative result must be combined with clinical observations, patient history, and epidemiological information. The expected result is Negative.  Fact Sheet for Patients:  EntrepreneurPulse.com.au  Fact Sheet for Healthcare Providers:  IncredibleEmployment.be  This test is no t yet approved or cleared by the [place] FDA and  has been authorized for detection and/or diagnosis of SARS-CoV-2 by FDA under an Emergency Use Authorization (EUA). This EUA will remain  in effect (meaning this test can be used) for the duration of the COVID-19 declaration under Section 564(b)(1) of  the Act, 21 U.S.C.section 360bbb-3(b)(1), unless the authorization is terminated  or revoked sooner.       Influenza A by PCR NEGATIVE NEGATIVE Final   Influenza B by PCR NEGATIVE NEGATIVE Final    Comment: (NOTE) The Xpert Xpress SARS-CoV-2/FLU/RSV plus assay is intended as an aid in the diagnosis of influenza from Nasopharyngeal swab specimens and should not be used as a sole basis for treatment. Nasal washings and aspirates are unacceptable for Xpert Xpress SARS-CoV-2/FLU/RSV testing.  Fact Sheet for Patients: EntrepreneurPulse.com.au  Fact Sheet for Healthcare Providers: IncredibleEmployment.be  This test is not yet approved or cleared by the [place] FDA and has been authorized for detection and/or diagnosis of SARS-CoV-2 by FDA under an Emergency Use Authorization (EUA). This EUA will remain in effect (meaning this test can be used) for the duration of the COVID-19 declaration under Section 564(b)(1) of the Act, 21 U.S.C. section 360bbb-3(b)(1), unless the authorization is terminated or revoked.     Resp Syncytial Virus by PCR NEGATIVE NEGATIVE Final    Comment: (NOTE) Fact Sheet for Patients: EntrepreneurPulse.com.au  Fact Sheet for Healthcare Providers: IncredibleEmployment.be  This test is not yet approved or cleared by the [place] FDA and has been authorized for detection and/or diagnosis of SARS-CoV-2 by FDA under an Emergency Use Authorization (EUA). This EUA will remain in effect (meaning this test can be used) for the duration of the COVID-19 declaration under Section 564(b)(1) of the Act, 21 U.S.C. section 360bbb-3(b)(1), unless the authorization is terminated or revoked.  Performed at [facility], [place], [place] [address]   Respiratory (~20 pathogens) panel by PCR     Status: None   Collection Time: 05/29/22  5:16 PM   Specimen:  Nasopharyngeal Swab; Respiratory  Result Value Ref Range Status   Adenovirus NOT DETECTED NOT DETECTED Final   Coronavirus 229E NOT DETECTED NOT DETECTED Final    Comment: (NOTE) The Coronavirus on the Respiratory Panel, DOES NOT test for the novel  Coronavirus (2019 nCoV)    Coronavirus HKU1 NOT DETECTED NOT DETECTED Final   Coronavirus NL63 NOT DETECTED NOT DETECTED Final   Coronavirus OC43 NOT DETECTED NOT DETECTED Final   Metapneumovirus NOT DETECTED NOT DETECTED Final   Rhinovirus / Enterovirus NOT DETECTED NOT DETECTED Final   Influenza A NOT DETECTED NOT DETECTED Final   Influenza B NOT DETECTED NOT DETECTED Final   Parainfluenza Virus 1 NOT DETECTED NOT DETECTED Final   Parainfluenza Virus 2 NOT DETECTED NOT DETECTED Final   Parainfluenza Virus 3 NOT DETECTED NOT DETECTED Final   Parainfluenza Virus 4 NOT DETECTED NOT DETECTED Final   Respiratory Syncytial Virus NOT DETECTED NOT DETECTED Final   Bordetella pertussis NOT DETECTED NOT DETECTED Final   Bordetella Parapertussis NOT DETECTED NOT DETECTED Final   Chlamydophila pneumoniae NOT DETECTED NOT DETECTED Final   Mycoplasma pneumoniae NOT DETECTED NOT DETECTED Final    Comment: Performed at [facility], [address] [place], [place] [address]  C Difficile Quick Screen w PCR reflex     Status: None   Collection Time: 05/30/22 12:05 AM   Specimen: STOOL  Result Value Ref Range Status   C Diff antigen NEGATIVE NEGATIVE Final   C Diff toxin NEGATIVE NEGATIVE Final   C Diff interpretation No C. difficile detected.  Final    Comment: Performed at [facility], [address] [place], [place] [address]  Gastrointestinal Panel by PCR , Stool     Status: None   Collection Time: 05/30/22 12:05 AM   Specimen: Stool  Result Value Ref Range Status   Campylobacter species NOT DETECTED NOT DETECTED Final   Plesimonas shigelloides NOT DETECTED NOT DETECTED Final   Salmonella species NOT DETECTED NOT DETECTED  Final   Yersinia enterocolitica NOT DETECTED NOT DETECTED Final   Vibrio species NOT DETECTED NOT DETECTED Final   Vibrio cholerae NOT DETECTED NOT DETECTED Final   Enteroaggregative E coli (EAEC) NOT DETECTED NOT DETECTED Final   Enteropathogenic E coli (EPEC) NOT DETECTED NOT DETECTED Final   Enterotoxigenic E coli (ETEC) NOT DETECTED NOT DETECTED Final   Shiga like toxin producing E coli (STEC) NOT DETECTED NOT DETECTED Final   Shigella/Enteroinvasive E coli (EIEC) NOT DETECTED NOT DETECTED Final   Cryptosporidium NOT DETECTED NOT DETECTED Final   Cyclospora cayetanensis NOT DETECTED NOT DETECTED Final   Entamoeba histolytica NOT DETECTED NOT DETECTED Final   Giardia lamblia NOT DETECTED NOT DETECTED Final   Adenovirus F40/41 NOT DETECTED NOT DETECTED Final   Astrovirus NOT DETECTED NOT DETECTED Final   Norovirus GI/GII NOT DETECTED NOT DETECTED Final   Rotavirus A NOT DETECTED NOT DETECTED Final   Sapovirus (I, II, IV, and V) NOT DETECTED NOT DETECTED Final    Comment: Performed at [facility], [address] [place], [place] [address]    Coagulation Studies: Recent Labs    05/28/22 1009  LABPROT 14.4  INR 1.1    Urinalysis: Recent Labs    05/28/22 1518  COLORURINE YELLOW*  LABSPEC 1.018  PHURINE 5.0  GLUCOSEU NEGATIVE  HGBUR MODERATE*  BILIRUBINUR NEGATIVE  KETONESUR NEGATIVE  PROTEINUR 30*  NITRITE NEGATIVE  LEUKOCYTESUR NEGATIVE      Imaging: ECHOCARDIOGRAM COMPLETE  Result Date: 05/29/2022    ECHOCARDIOGRAM REPORT   Patient Name:   Gavin Maxwell Steines Date of Exam: 05/29/2022 Medical Rec #:  [id number]       Height:       71.0 in Accession #:    [id number]      Weight:       223.8 lb Date of Birth:  [birth year]       BSA:          2.212 m Patient Age:    77 years        BP:           122/59 mmHg Patient Gender: M               HR:           91 bpm. Exam Location:  [facility] Procedure: 2D Echo, Cardiac Doppler and Color Doppler Indications:     CHF---acute  diastolic [id number]  History:         Patient has no prior history of Echocardiogram examinations.                  COPD; Risk Factors:Hypertension and Sleep Apnea.  Sonographer:     Sonia Side  [place] Referring Phys:  [place] Diagnosing Phys: [place]  1. Left ventricular ejection fraction, by estimation, is 60 to 65%. The left ventricle has normal function. The left ventricle has no regional wall motion abnormalities. There is moderate concentric left ventricular hypertrophy. Left ventricular diastolic parameters are indeterminate.  2. Right ventricular systolic function is normal. The right ventricular size is normal.  3. The mitral valve is normal in structure. Mild mitral valve regurgitation. No evidence of mitral stenosis.  4. The aortic valve is normal in structure. Aortic valve regurgitation is not visualized. Aortic valve sclerosis/calcification is present, without any evidence of aortic stenosis.  5. The inferior vena cava is normal in size with greater than 50% respiratory variability, suggesting right atrial pressure of 3 mmHg. FINDINGS  Left Ventricle: Left ventricular ejection fraction, by estimation, is 60 to 65%. The left ventricle has normal function. The left ventricle has no regional wall motion abnormalities. The left ventricular internal cavity size was normal in size. There is  moderate concentric left ventricular hypertrophy. Left ventricular diastolic parameters are indeterminate. Right Ventricle: The right ventricular size is normal. No increase in right ventricular wall thickness. Right ventricular systolic function is normal. Left Atrium: Left atrial size was normal in size. Right Atrium: Right atrial size was normal in size. Pericardium: There is no evidence of pericardial effusion. Mitral Valve: The mitral valve is normal in structure. Mild mitral valve regurgitation. No evidence of mitral valve stenosis. Tricuspid Valve: The tricuspid valve is normal in structure. Tricuspid  valve regurgitation is trivial. No evidence of tricuspid stenosis. Aortic Valve: The aortic valve is normal in structure. Aortic valve regurgitation is not visualized. Aortic valve sclerosis/calcification is present, without any evidence of aortic stenosis. Aortic valve mean gradient measures 2.0 mmHg. Aortic valve peak  gradient measures 3.5 mmHg. Aortic valve area, by VTI measures 2.92 cm. Pulmonic Valve: The pulmonic valve was normal in structure. Pulmonic valve regurgitation is not visualized. No evidence of pulmonic stenosis. Aorta: The aortic root is normal in size and structure. Venous: The inferior vena cava is normal in size with greater than 50% respiratory variability, suggesting right atrial pressure of 3 mmHg. IAS/Shunts: No atrial level shunt detected by color flow Doppler.  LEFT VENTRICLE PLAX 2D LVIDd:         4.50 cm LVIDs:         3.60 cm LV PW:         0.80 cm LV IVS:        1.20 cm LVOT diam:     2.20 cm LV SV:         49 LV SV Index:   22 LVOT Area:     3.80 cm  RIGHT VENTRICLE RV Basal diam:  3.20 cm RV Mid diam:    3.00 cm RV S prime:     11.60 cm/s TAPSE (M-mode): 2.2 cm LEFT ATRIUM             Index        RIGHT ATRIUM           Index LA diam:        3.00 cm 1.36 cm/m   RA Area:     16.10 cm LA Vol (A2C):   40.4 ml 18.27 ml/m  RA Volume:   36.70 ml  16.59 ml/m LA Vol (A4C):   51.5 ml 23.29 ml/m LA Biplane Vol: 48.1 ml 21.75 ml/m  AORTIC VALVE AV Area (Vmax):    3.15 cm  AV Area (Vmean):   2.91 cm AV Area (VTI):     2.92 cm AV Vmax:           93.30 cm/s AV Vmean:          67.900 cm/s AV VTI:            0.168 m AV Peak Grad:      3.5 mmHg AV Mean Grad:      2.0 mmHg LVOT Vmax:         77.40 cm/s LVOT Vmean:        51.900 cm/s LVOT VTI:          0.129 m LVOT/AV VTI ratio: 0.77  AORTA Ao Root diam: 3.90 cm MITRAL VALVE               TRICUSPID VALVE MV Area (PHT): 4.49 cm    TR Peak grad:   19.4 mmHg MV Decel Time: 169 msec    TR Vmax:        220.00 cm/s MV E velocity: 85.40 cm/s                             SHUNTS                            Systemic VTI:  0.13 m                            Systemic Diam: 2.20 cm Neoma Laming Electronically signed by Neoma Laming Signature Date/Time: 05/29/2022/9:48:56 AM    Final    US RENAL  Result Date: 05/28/2022 CLINICAL DATA:  Acute kidney injury in a 77 year old male. EXAM: RENAL / URINARY TRACT ULTRASOUND COMPLETE COMPARISON:  CT imaging from January 2021. FINDINGS: Right Kidney: Renal measurements: 8.5 x 5.1 x 5.2 cm = volume: 119 mL. Small cyst in the RIGHT kidney measuring 1.7 x 1.3 x 1.2 cm. Mild parenchymal scarring of the RIGHT kidney. No hydronephrosis. Increased cortical echogenicity on the RIGHT. Left Kidney: Renal measurements: 9.8 x 5.2 x 5.5 cm = volume: 146 mL. Cysts in the RIGHT kidney largest 4.7 x 3.7 x 3.3 cm arising from the upper pole. No hydronephrosis. Mild increased cortical echogenicity. Trace perinephric fluid or stranding. Bladder: Appears normal for degree of bladder distention. Other: Increased echogenicity is subcutaneous and retroperitoneal fat could be seen in the setting of diffuse edema. IMPRESSION: Signs of medical renal disease and renal cysts.  No hydronephrosis. Question of anasarca. Trace perinephric fluid suggested on the LEFT is nonfocal in appearance and can be seen in the setting of anasarca, renal dysfunction or infection. Correlation with urinalysis may also be helpful. Electronically Signed   By: Zetta Bills M.D.   On: 05/28/2022 14:06     Medications:    heparin 1,200 Units/hr (05/30/22 [id number])    aspirin EC  81 mg Oral Daily   atorvastatin  80 mg Oral Daily   brimonidine  1 drop Both Eyes Q12H   calcium-vitamin D  1 tablet Oral Daily   doxycycline  100 mg Oral BID   fenofibrate  160 mg Oral Daily   ferrous sulfate  325 mg Oral Daily   fluticasone  2 spray Each Nare Daily   gabapentin  100 mg Oral TID   imipramine  25 mg Oral QHS   ipratropium-albuterol  3 mL Nebulization TID  latanoprost  1  drop Both Eyes QHS   magnesium oxide  400 mg Oral TID   metoprolol tartrate  25 mg Oral Q6H   mometasone-formoterol  2 puff Inhalation BID   multivitamin with minerals  1 tablet Oral Daily   pantoprazole  40 mg Oral Daily   polycarbophil  625 mg Oral BID   predniSONE  20 mg Oral Daily   sertraline  50 mg Oral QHS   sodium chloride  1 g Oral BID WC   timolol  1 drop Both Eyes BID   albuterol, dextromethorphan-guaiFENesin, hydrALAZINE, nitroGLYCERIN, ondansetron (ZOFRAN) IV  Assessment/ Plan:  Gavin Maxwell is a 77 y.o.  male with past medical conditions including hyperlipidemia, COPD, hypertension, anemia, depression and anxiety, obstructive sleep apnea with CPAP, and chronic kidney disease stage IIIa, who was admitted to [facility] on 05/28/2022 for NSTEMI (non-ST elevated myocardial infarction) ([place]) [I21.4] Atrial fibrillation with rapid ventricular response ([place]) [I48.91] Acute renal failure superimposed on chronic kidney disease, unspecified acute renal failure type, unspecified CKD stage ([place]) [N17.9, N18.9]   Acute kidney injury with proteinuria on chronic kidney disease IIIa. Baseline creatinine 1.8 with GFR 38 on 02/22/23. Acute kidney injury likely secondary to hemodynamic instability with NSAID use. NSTEMI on admission. UA positive for protein.   Creatinine continues to improve with adequate urine output. Discussed NSAID use with patient and alternative needs for pain management encouraged.   Lab Results  Component Value Date   CREATININE 2.79 (H) 05/30/2022   CREATININE 3.55 (H) 05/29/2022   CREATININE 3.75 (H) 05/29/2022    Intake/Output Summary (Last 24 hours) at 05/30/2022 1208 Last data filed at 05/30/2022 0402 Gross per 24 hour  Intake 174.79 ml  Output 200 ml  Net -25.21 ml   2. Anemia of chronic kidney disease Lab Results  Component Value Date   HGB 10.5 (L) 05/30/2022    Hgb stable  3. Hypertension with chronic kidney disease. Home regimen includes  metoprolol. Receiving this medication.     LOS: 2 Gavin Maxwell 2/23/202412:08 PM

## 2022-05-30 NOTE — Progress Notes (Addendum)
PROGRESS NOTE    Gavin Maxwell  [id number] DOB: 02-Apr-1946 DOA: 05/28/2022 PCP: Idelle Crouch, MD  [id number]  LOS: 2 days   Brief hospital course:   Assessment & Plan: Gavin Maxwell is a 77 y.o. male with medical history significant of HTN, HLD, COPD not on oxygen at baseline, depression with anxiety, CKD 3A, anemia, hiatal hernia, OSA on CPAP, NHL in remission, SVT, who presents with shortness breath and diarrhea.   Patient states that has SOB for about 5 day, which has been progressively worsening.  Patient was seen by PCP on 2/19, and diagnosed with COPD exacerbation.  Patient given prescription for doxycycline and prednisone, without significant improvement.   Dyspnea and cough Acute respiratory failure ruled out --likely viral illness.  Resp panel neg, RVP neg. --start Mucomyst neb BID  NSTEMI (non-ST elevated myocardial infarction) [facility]):   --In the setting of respiratory failure, viral syndrome, atrial fibrillation with RVR, renal failure --Troponin 2100, trending downward Normal LV function with no regional wall motion abnormality on echo --started on heparin gtt.  Cardiology consulted.  Not a good candidate for cardiac catheterization given renal failure. Plan: --cont heparin gtt --Ischemic workup deferred for now.   -If renal function improves back to baseline, could consider cardiac catheterization at a later date    Chronic diastolic congestive heart failure ([place]) Ruled out acute exacerbation   COPD with acute exacerbation [facility]):  Currently patient does not have wheezing. -continue outpatient prescribed prednisone and doxycycline -Bronchodilators   Atrial fibrillation with RVR ([place]):  --new onset.  CHADS2 score is 5 (hypertension, age 17, CHF, CAD).  --cont Lopressor 25 mg q6h --cont heparin gtt, plan to discharge on Eliquis   Essential hypertension --BP varied, sometimes hypotensive --cont Lopressor 25 mg q6h for rate control    Hyperlipidemia -Lipitor and fenofibrate   Acute renal failure superimposed on stage 3b chronic kidney disease ([place]):  --Baseline creatinine 1.8, presenting creatinine 4.2.  Likely due to dehydration secondary to diarrhea -Patient received 1 L normal saline bolus in the ED --nephro consulted Plan: -hold Mobic  Diarrhea -Check C. difficile and GI pathogen panel both neg   Abnormal LFTs: Likely due to liver congestion secondary to CHF -Check hepatitis panel   Hyponatremia:  Sodium 127, mental status normal.  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     Objective: Vitals:   05/30/22 0404 05/30/22 0735 05/30/22 1110 05/30/22 1530  BP:  100/64 (!) 89/53 95/69  Pulse:  92 94 72  Resp:  '18 18 18  '$ Temp:  98.6 F (37 C) 98.4 F (36.9 C) 98.4 F (36.9 C)  TempSrc:      SpO2:  (!) 88% 98% 98%  Weight: 99.3 kg     Height:        Intake/Output Summary (Last 24 hours) at 05/30/2022 1602 Last data filed at 05/30/2022 1523 Gross per 24 hour  Intake 734.5 ml  Output 1100 ml  Net -365.5 ml   Filed Weights   05/28/22 1003 05/29/22 0500 05/30/22 0404  Weight: 106 kg 101.5 kg 99.3 kg    Examination:   Constitutional: NAD, AAOx3 HEENT: conjunctivae and lids normal, EOMI, voice hoarse CV: No cyanosis.   RESP: normal respiratory effort, on 2L Neuro: II - XII grossly intact.   Psych: Normal mood and affect.   Appropriate judgement and reason   Data Reviewed: I have personally reviewed labs and imaging studies  Time spent: 35 minutes  Enzo Bi, MD [facility] If 7PM-7AM, please contact night-coverage 05/30/2022, 4:02 PM

## 2022-05-30 NOTE — Progress Notes (Signed)
.   Rounding Note    Patient Name: Gavin Maxwell Date of Encounter: 05/30/2022  [place] Cardiologist: Gavin Rogue, MD   Subjective   Reports resting comfortably today, continued cough but slowly improving Denies significant pain No additional diarrhea Telemetry reviewed, remains in atrial fibrillation rate 90-100  Inpatient Medications    Scheduled Meds:  aspirin EC  81 mg Oral Daily   atorvastatin  80 mg Oral Daily   brimonidine  1 drop Both Eyes Q12H   calcium-vitamin D  1 tablet Oral Daily   doxycycline  100 mg Oral BID   fenofibrate  160 mg Oral Daily   ferrous sulfate  325 mg Oral Daily   fluticasone  2 spray Each Nare Daily   gabapentin  100 mg Oral TID   imipramine  25 mg Oral QHS   ipratropium-albuterol  3 mL Nebulization TID   latanoprost  1 drop Both Eyes QHS   magnesium oxide  400 mg Oral TID   metoprolol tartrate  25 mg Oral Q6H   mometasone-formoterol  2 puff Inhalation BID   multivitamin with minerals  1 tablet Oral Daily   pantoprazole  40 mg Oral Daily   polycarbophil  625 mg Oral BID   predniSONE  20 mg Oral Daily   sertraline  50 mg Oral QHS   sodium chloride  1 g Oral BID WC   timolol  1 drop Both Eyes BID   Continuous Infusions:  heparin 1,200 Units/hr (05/30/22 0607)   PRN Meds: albuterol, dextromethorphan-guaiFENesin, hydrALAZINE, nitroGLYCERIN, ondansetron (ZOFRAN) IV   Vital Signs    Vitals:   05/30/22 0326 05/30/22 0328 05/30/22 0404 05/30/22 0735  BP: 104/61 104/61  100/64  Pulse: 89 (!) 105  92  Resp: '20 20  18  '$ Temp: (!) 97.4 F (36.3 C) (!) 97.4 F (36.3 C)  98.6 F (37 C)  TempSrc:      SpO2: 99% 100%  (!) 88%  Weight:   99.3 kg   Height:        Intake/Output Summary (Last 24 hours) at 05/30/2022 1050 Last data filed at 05/30/2022 0402 Gross per 24 hour  Intake 218.77 ml  Output 200 ml  Net 18.77 ml      05/30/2022    4:04 AM 05/29/2022    5:00 AM 05/28/2022   10:03 AM  Last 3 Weights  Weight  (lbs) 218 lb 14.7 oz 223 lb 12.3 oz 233 lb 11 oz  Weight (kg) 99.3 kg 101.5 kg 106 kg      Telemetry    Atrial fibrillation rate 90-100- Personally Reviewed  ECG     - Personally Reviewed  Physical Exam   GEN: No acute distress.   Neck: No JVD Cardiac: Irregularly irregular, no murmurs, rubs, or gallops.  Respiratory: Clear to auscultation bilaterally. GI: Soft, nontender, non-distended  MS: No edema; No deformity. Neuro:  Nonfocal  Psych: Normal affect   Labs    High Sensitivity Troponin:   Recent Labs  Lab 05/28/22 1009 05/28/22 1207 05/28/22 1510  TROPONINIHS 2,196* 1,998* 1,810*     Chemistry Recent Labs  Lab 05/28/22 1009 05/28/22 1207 05/29/22 0618 05/29/22 0957 05/29/22 1346 05/30/22 0630  NA 127*   < > 130* 131* 131* 132*  K 3.9   < > 4.1 4.3 4.3 4.0  CL 96*   < > 99 98 97* 103  CO2 19*   < > 20* 21* 23 20*  GLUCOSE 121*   < >  98 105* 98 106*  BUN 82*   < > 85* 85* 84* 74*  CREATININE 4.20*   < > 3.71* 3.75* 3.55* 2.79*  CALCIUM 8.5*   < > 8.1* 8.4* 8.2* 8.2*  MG  --   --  1.8  --   --  1.9  PROT 6.7  --   --  6.3*  --   --   ALBUMIN 2.9*  --   --  2.6*  --   --   AST 128*  --   --  73*  --   --   ALT 56*  --   --  50*  --   --   ALKPHOS 44  --   --  46  --   --   BILITOT 0.8  --   --  0.9  --   --   GFRNONAA 14*   < > 16* 16* 17* 22*  ANIONGAP 12   < > '11 12 11 9   '$ < > = values in this interval not displayed.    Lipids  Recent Labs  Lab 05/29/22 0618  CHOL 69  TRIG 154*  HDL 11*  LDLCALC 27  CHOLHDL 6.3    Hematology Recent Labs  Lab 05/28/22 1009 05/29/22 0618 05/30/22 0630  WBC 10.2 10.3 10.2  RBC 3.69* 3.25* 3.34*  HGB 11.7* 10.3* 10.5*  HCT 34.6* 30.1* 31.5*  MCV 93.8 92.6 94.3  MCH 31.7 31.7 31.4  MCHC 33.8 34.2 33.3  RDW 13.2 13.2 13.4  PLT 259 220 217   Thyroid  Recent Labs  Lab 05/28/22 1510  TSH 2.008    BNP Recent Labs  Lab 05/28/22 1009  BNP 215.1*    DDimer No results for input(s): "DDIMER" in the  last 168 hours.   Radiology    ECHOCARDIOGRAM COMPLETE  Result Date: 05/29/2022    ECHOCARDIOGRAM REPORT   Patient Name:   Gavin Maxwell Date of Exam: 05/29/2022 Medical Rec #:  [id number]       Height:       71.0 in Accession #:    [id number]      Weight:       223.8 lb Date of Birth:  [birth year]       BSA:          2.212 m Patient Age:    77 years        BP:           122/59 mmHg Patient Gender: M               HR:           91 bpm. Exam Location:  [facility] Procedure: 2D Echo, Cardiac Doppler and Color Doppler Indications:     CHF---acute diastolic [id number]  History:         Patient has no prior history of Echocardiogram examinations.                  COPD; Risk Factors:Hypertension and Sleep Apnea.  Sonographer:     Sherrie Sport Referring Phys:  [place] Diagnosing Phys: Gavin Maxwell  1. Left ventricular ejection fraction, by estimation, is 60 to 65%. The left ventricle has normal function. The left ventricle has no regional wall motion abnormalities. There is moderate concentric left ventricular hypertrophy. Left ventricular diastolic parameters are indeterminate.  2. Right ventricular systolic function is normal. The right ventricular size is normal.  3. The mitral valve is normal in  structure. Mild mitral valve regurgitation. No evidence of mitral stenosis.  4. The aortic valve is normal in structure. Aortic valve regurgitation is not visualized. Aortic valve sclerosis/calcification is present, without any evidence of aortic stenosis.  5. The inferior vena cava is normal in size with greater than 50% respiratory variability, suggesting right atrial pressure of 3 mmHg. FINDINGS  Left Ventricle: Left ventricular ejection fraction, by estimation, is 60 to 65%. The left ventricle has normal function. The left ventricle has no regional wall motion abnormalities. The left ventricular internal cavity size was normal in size. There is  moderate concentric left ventricular hypertrophy. Left ventricular  diastolic parameters are indeterminate. Right Ventricle: The right ventricular size is normal. No increase in right ventricular wall thickness. Right ventricular systolic function is normal. Left Atrium: Left atrial size was normal in size. Right Atrium: Right atrial size was normal in size. Pericardium: There is no evidence of pericardial effusion. Mitral Valve: The mitral valve is normal in structure. Mild mitral valve regurgitation. No evidence of mitral valve stenosis. Tricuspid Valve: The tricuspid valve is normal in structure. Tricuspid valve regurgitation is trivial. No evidence of tricuspid stenosis. Aortic Valve: The aortic valve is normal in structure. Aortic valve regurgitation is not visualized. Aortic valve sclerosis/calcification is present, without any evidence of aortic stenosis. Aortic valve mean gradient measures 2.0 mmHg. Aortic valve peak  gradient measures 3.5 mmHg. Aortic valve area, by VTI measures 2.92 cm. Pulmonic Valve: The pulmonic valve was normal in structure. Pulmonic valve regurgitation is not visualized. No evidence of pulmonic stenosis. Aorta: The aortic root is normal in size and structure. Venous: The inferior vena cava is normal in size with greater than 50% respiratory variability, suggesting right atrial pressure of 3 mmHg. IAS/Shunts: No atrial level shunt detected by color flow Doppler.  LEFT VENTRICLE PLAX 2D LVIDd:         4.50 cm LVIDs:         3.60 cm LV PW:         0.80 cm LV IVS:        1.20 cm LVOT diam:     2.20 cm LV SV:         49 LV SV Index:   22 LVOT Area:     3.80 cm  RIGHT VENTRICLE RV Basal diam:  3.20 cm RV Mid diam:    3.00 cm RV S prime:     11.60 cm/s TAPSE (M-mode): 2.2 cm LEFT ATRIUM             Index        RIGHT ATRIUM           Index LA diam:        3.00 cm 1.36 cm/m   RA Area:     16.10 cm LA Vol (A2C):   40.4 ml 18.27 ml/m  RA Volume:   36.70 ml  16.59 ml/m LA Vol (A4C):   51.5 ml 23.29 ml/m LA Biplane Vol: 48.1 ml 21.75 ml/m  AORTIC VALVE AV  Area (Vmax):    3.15 cm AV Area (Vmean):   2.91 cm AV Area (VTI):     2.92 cm AV Vmax:           93.30 cm/s AV Vmean:          67.900 cm/s AV VTI:            0.168 m AV Peak Grad:      3.5 mmHg AV Mean Grad:  2.0 mmHg LVOT Vmax:         77.40 cm/s LVOT Vmean:        51.900 cm/s LVOT VTI:          0.129 m LVOT/AV VTI ratio: 0.77  AORTA Ao Root diam: 3.90 cm MITRAL VALVE               TRICUSPID VALVE MV Area (PHT): 4.49 cm    TR Peak grad:   19.4 mmHg MV Decel Time: 169 msec    TR Vmax:        220.00 cm/s MV E velocity: 85.40 cm/s                            SHUNTS                            Systemic VTI:  0.13 m                            Systemic Diam: 2.20 cm Neoma Laming Electronically signed by Neoma Laming Signature Date/Time: 05/29/2022/9:48:56 AM    Final    US RENAL  Result Date: 05/28/2022 CLINICAL DATA:  Acute kidney injury in a 77 year old male. EXAM: RENAL / URINARY TRACT ULTRASOUND COMPLETE COMPARISON:  CT imaging from January 2021. FINDINGS: Right Kidney: Renal measurements: 8.5 x 5.1 x 5.2 cm = volume: 119 mL. Small cyst in the RIGHT kidney measuring 1.7 x 1.3 x 1.2 cm. Mild parenchymal scarring of the RIGHT kidney. No hydronephrosis. Increased cortical echogenicity on the RIGHT. Left Kidney: Renal measurements: 9.8 x 5.2 x 5.5 cm = volume: 146 mL. Cysts in the RIGHT kidney largest 4.7 x 3.7 x 3.3 cm arising from the upper pole. No hydronephrosis. Mild increased cortical echogenicity. Trace perinephric fluid or stranding. Bladder: Appears normal for degree of bladder distention. Other: Increased echogenicity is subcutaneous and retroperitoneal fat could be seen in the setting of diffuse edema. IMPRESSION: Signs of medical renal disease and renal cysts.  No hydronephrosis. Question of anasarca. Trace perinephric fluid suggested on the LEFT is nonfocal in appearance and can be seen in the setting of anasarca, renal dysfunction or infection. Correlation with urinalysis may also be helpful.  Electronically Signed   By: Zetta Bills M.D.   On: 05/28/2022 14:06    Cardiac Studies   Echo   1. Left ventricular ejection fraction, by estimation, is 60 to 65%. The  left ventricle has normal function. The left ventricle has no regional  wall motion abnormalities. There is moderate concentric left ventricular  hypertrophy. Left ventricular  diastolic parameters are indeterminate.   2. Right ventricular systolic function is normal. The right ventricular  size is normal.   3. The mitral valve is normal in structure. Mild mitral valve  regurgitation. No evidence of mitral stenosis.   4. The aortic valve is normal in structure. Aortic valve regurgitation is  not visualized. Aortic valve sclerosis/calcification is present, without  any evidence of aortic stenosis.   5. The inferior vena cava is normal in size with greater than 50%  respiratory variability, suggesting right atrial pressure of 3 mmHg.   Patient Profile     77 y.o. 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   Assessment & Plan    Acute respiratory failure Concerning for viral syndrome the respiratory panel negative Began with bodyaches, diarrhea, coughing On steroids, nebulizers, antibiotics Slow improvement    Atrial fibrillation with RVR Noted on presentation, rate control has been challenging secondary to hypotension Tolerating metoprolol tartrate 25 mg every 6 hours with better rate control On heparin infusion No immediate plans for cardioversion given respiratory distress -Plan to discharge on Eliquis   Non-STEMI In the  setting of respiratory failure, viral syndrome, atrial fibrillation with RVR, renal failure Troponin 2100, trending downward Normal LV function with no regional wall motion abnormality on echo On heparin infusion, statin, beta-blocker Not a good candidate for cardiac catheterization currently given renal failure Large body habitus would make Myoview less appealing -If renal function improves back to baseline, could consider cardiac catheterization at a later date   Acute on chronic renal failure Baseline creatinine 1.8, presenting creatinine 4.2 likely ATN in the setting of viral syndrome Slow improvement, creatinine <3 on today's labs   Total encounter time more than 50 minutes  Greater than 50% was spent in counseling and coordination of care with the patient   For questions or updates, please contact [place] Please consult [email or web address] for contact info under        Signed, Gavin Rogue, MD  05/30/2022, 10:50 AM

## 2022-05-30 NOTE — Consult Note (Signed)
[place] for Heparin Indication: chest pain/ACS and afib.   Allergies  Allergen Reactions   Augmentin [Amoxicillin-Pot Clavulanate] Nausea Only    Did it involve swelling of the face/tongue/throat, SOB, or low BP? No Did it involve sudden or severe rash/hives, skin peeling, or any reaction on the inside of your mouth or nose? No Did you need to seek medical attention at a hospital or doctor's office? No When did it last happen? More than 10 years If all above answers are "NO", may proceed with cephalosporin use.    Celebrex [Celecoxib] Nausea Only   Oxycodone Other (See Comments)    Confusion, makes him "crazy"    Patient Measurements: Height: '5\' 11"'$  (180.3 cm) Weight: 99.3 kg (218 lb 14.7 oz) IBW/kg (Calculated) : 75.3 Heparin Dosing Weight: 97.7 kg  Vital Signs: Temp: 97.4 F (36.3 C) (02/23 0328) Temp Source: Oral (02/22 2102) BP: 104/61 (02/23 0328) Pulse Rate: 105 (02/23 0328)  Labs: Recent Labs    05/28/22 1009 05/28/22 1207 05/28/22 1510 05/28/22 2015 05/29/22 0618 05/29/22 0957 05/29/22 1346 05/30/22 0630  HGB 11.7*  --   --   --  10.3*  --   --  10.5*  HCT 34.6*  --   --   --  30.1*  --   --  31.5*  PLT 259  --   --   --  220  --   --  217  APTT 37*  --   --   --   --   --   --   --   LABPROT 14.4  --   --   --   --   --   --   --   INR 1.1  --   --   --   --   --   --   --   HEPARINUNFRC  --   --   --  0.32 0.46  --   --  0.36  CREATININE 4.20* 4.04*  --  4.01* 3.71* 3.75* 3.55*  --   TROPONINIHS 2,196* 1,998* 1,810*  --   --   --   --   --      Estimated Creatinine Clearance: 20.3 mL/min (A) (by C-G formula based on SCr of 3.55 mg/dL (H)).   Medical History: Past Medical History:  Diagnosis Date   Anxiety    Cancer ([place])    non hodgins  lymphoma   2004 in remission  Left arm  wears a brace there is a bone broken   CKD (chronic kidney disease) stage 3, GFR 30-59 ml/min (HCC)    COPD (chronic obstructive  pulmonary disease) (HCC)    Depression    Dysrhythmia    SVT   GERD (gastroesophageal reflux disease)    History of hiatal hernia    Hypertension    Pneumonia    Primary localized osteoarthritis of knee 09/26/2014   Primary localized osteoarthrosis, shoulder region, rotator cuff arthropathy 10/04/2013   Sleep apnea    cpap    Medications:  No evidence of PTA anticoagulants  Assessment: Pharmacy consulted to dose heparin in this 40 to M presenting to the ED with ACS/STEMI.  CrCl 18, Troponin 2196. Not a good candidate for cardiac cath. Potentially plan to do a stress test. CHADSVASc 4.   2/22 0618 0.46  2/23 0630 0.36.    Goal of Therapy:  Heparin level 0.3-0.7 units/ml Monitor platelets by anticoagulation protocol: Yes   Plan:  Heparin  level is therapeutic. Will continue heparin infusion at 1200 units/hr. Recheck heparin level and CBC with AM labs. Plan to switch to apixaban at discharge.   [name], PharmD, BCPS Clinical Pharmacist   05/30/2022,7:34 AM

## 2022-05-30 NOTE — Plan of Care (Signed)
  Problem: Education: Goal: Ability to demonstrate management of disease process will improve Outcome: Progressing Goal: Ability to verbalize understanding of medication therapies will improve Outcome: Progressing Goal: Individualized Educational Video(s) Outcome: Progressing   Problem: Activity: Goal: Capacity to carry out activities will improve Outcome: Progressing   Problem: Cardiac: Goal: Ability to achieve and maintain adequate cardiopulmonary perfusion will improve Outcome: Progressing   Problem: Education: Goal: Knowledge of disease or condition will improve Outcome: Progressing Goal: Knowledge of the prescribed therapeutic regimen will improve Outcome: Progressing Goal: Individualized Educational Video(s) Outcome: Progressing   Problem: Activity: Goal: Ability to tolerate increased activity will improve Outcome: Progressing Goal: Will verbalize the importance of balancing activity with adequate rest periods Outcome: Progressing   Problem: Respiratory: Goal: Ability to maintain a clear airway will improve Outcome: Progressing Goal: Levels of oxygenation will improve Outcome: Progressing Goal: Ability to maintain adequate ventilation will improve Outcome: Progressing   Problem: Education: Goal: Knowledge of General Education information will improve Description Including pain rating scale, medication(s)/side effects and non-pharmacologic comfort measures Outcome: Progressing   Problem: Health Behavior/Discharge Planning: Goal: Ability to manage health-related needs will improve Outcome: Progressing   Problem: Clinical Measurements: Goal: Ability to maintain clinical measurements within normal limits will improve Outcome: Progressing Goal: Will remain free from infection Outcome: Progressing Goal: Diagnostic test results will improve Outcome: Progressing Goal: Respiratory complications will improve Outcome: Progressing Goal: Cardiovascular complication  will be avoided Outcome: Progressing   Problem: Activity: Goal: Risk for activity intolerance will decrease Outcome: Progressing   Problem: Nutrition: Goal: Adequate nutrition will be maintained Outcome: Progressing   Problem: Coping: Goal: Level of anxiety will decrease Outcome: Progressing   Problem: Elimination: Goal: Will not experience complications related to bowel motility Outcome: Progressing Goal: Will not experience complications related to urinary retention Outcome: Progressing   Problem: Pain Managment: Goal: General experience of comfort will improve Outcome: Progressing   Problem: Safety: Goal: Ability to remain free from injury will improve Outcome: Progressing   Problem: Skin Integrity: Goal: Risk for impaired skin integrity will decrease Outcome: Progressing   

## 2022-05-31 DIAGNOSIS — N179 Acute kidney failure, unspecified: Secondary | ICD-10-CM | POA: Diagnosis not present

## 2022-05-31 DIAGNOSIS — I4891 Unspecified atrial fibrillation: Secondary | ICD-10-CM | POA: Diagnosis not present

## 2022-05-31 DIAGNOSIS — I214 Non-ST elevation (NSTEMI) myocardial infarction: Secondary | ICD-10-CM | POA: Diagnosis not present

## 2022-05-31 DIAGNOSIS — J9601 Acute respiratory failure with hypoxia: Secondary | ICD-10-CM | POA: Diagnosis not present

## 2022-05-31 LAB — CBC
HCT: 32.3 % — ABNORMAL LOW (ref 39.0–52.0)
Hemoglobin: 10.8 g/dL — ABNORMAL LOW (ref 13.0–17.0)
MCH: 31.6 pg (ref 26.0–34.0)
MCHC: 33.4 g/dL (ref 30.0–36.0)
MCV: 94.4 fL (ref 80.0–100.0)
Platelets: 230 10*3/uL (ref 150–400)
RBC: 3.42 MIL/uL — ABNORMAL LOW (ref 4.22–5.81)
RDW: 13.6 % (ref 11.5–15.5)
WBC: 9.4 10*3/uL (ref 4.0–10.5)
nRBC: 0 % (ref 0.0–0.2)

## 2022-05-31 LAB — BASIC METABOLIC PANEL
Anion gap: 8 (ref 5–15)
BUN: 67 mg/dL — ABNORMAL HIGH (ref 8–23)
CO2: 19 mmol/L — ABNORMAL LOW (ref 22–32)
Calcium: 8.5 mg/dL — ABNORMAL LOW (ref 8.9–10.3)
Chloride: 105 mmol/L (ref 98–111)
Creatinine, Ser: 2.25 mg/dL — ABNORMAL HIGH (ref 0.61–1.24)
GFR, Estimated: 29 mL/min — ABNORMAL LOW (ref >60)
Glucose, Bld: 112 mg/dL — ABNORMAL HIGH (ref 70–99)
Potassium: 4.3 mmol/L (ref 3.5–5.1)
Sodium: 132 mmol/L — ABNORMAL LOW (ref 135–145)

## 2022-05-31 LAB — MAGNESIUM: Magnesium: 1.8 mg/dL (ref 1.7–2.4)

## 2022-05-31 LAB — HEPARIN LEVEL (UNFRACTIONATED): Heparin Unfractionated: 0.46 IU/mL (ref 0.30–0.70)

## 2022-05-31 MED ORDER — PHENOL 1.4 % MT LIQD
1.0000 | OROMUCOSAL | Status: DC | PRN
  Filled 2022-05-31: qty 177

## 2022-05-31 MED ORDER — APIXABAN 5 MG PO TABS
5.0000 mg | ORAL_TABLET | Freq: Two times a day (BID) | ORAL | Status: DC
  Administered 2022-05-31 – 2022-06-04 (×9): 5 mg via ORAL
  Filled 2022-05-31 (×9): qty 1

## 2022-05-31 MED ORDER — GUAIFENESIN-DM 100-10 MG/5ML PO SYRP
5.0000 mL | ORAL_SOLUTION | ORAL | Status: DC | PRN
  Administered 2022-05-31: 5 mL via ORAL
  Filled 2022-05-31: qty 10

## 2022-05-31 NOTE — Consult Note (Addendum)
[place] for Apixaban Indication: AF  Allergies  Allergen Reactions   Augmentin [Amoxicillin-Pot Clavulanate] Nausea Only    Did it involve swelling of the face/tongue/throat, SOB, or low BP? No Did it involve sudden or severe rash/hives, skin peeling, or any reaction on the inside of your mouth or nose? No Did you need to seek medical attention at a hospital or doctor's office? No When did it last happen? More than 10 years If all above answers are "NO", may proceed with cephalosporin use.    Celebrex [Celecoxib] Nausea Only   Oxycodone Other (See Comments)    Confusion, makes him "crazy"    Patient Measurements: Height: '5\' 11"'$  (180.3 cm) Weight: 99.2 kg (218 lb 11.1 oz) IBW/kg (Calculated) : 75.3 Heparin Dosing Weight: 97.7 kg  Vital Signs: Temp: 98.6 F (37 C) (02/24 1128) Temp Source: Oral (02/24 0533) BP: 91/62 (02/24 1201) Pulse Rate: 92 (02/24 1201)  Labs: Recent Labs    05/28/22 1207 05/28/22 1510 05/28/22 2015 05/29/22 0618 05/29/22 0957 05/29/22 1346 05/30/22 0630 05/31/22 0436  HGB  --   --    < > 10.3*  --   --  10.5* 10.8*  HCT  --   --   --  30.1*  --   --  31.5* 32.3*  PLT  --   --   --  220  --   --  217 230  HEPARINUNFRC  --   --    < > 0.46  --   --  0.36 0.46  CREATININE 4.04*  --    < > 3.71*   < > 3.55* 2.79* 2.25*  TROPONINIHS 1,998* 1,810*  --   --   --   --   --   --    < > = values in this interval not displayed.     Estimated Creatinine Clearance: 32 mL/min (A) (by C-G formula based on SCr of 2.25 mg/dL (H)).   Medical History: Past Medical History:  Diagnosis Date   Anxiety    Cancer ([place])    non hodgins  lymphoma   2004 in remission  Left arm  wears a brace there is a bone broken   CKD (chronic kidney disease) stage 3, GFR 30-59 ml/min (HCC)    COPD (chronic obstructive pulmonary disease) (HCC)    Depression    Dysrhythmia    SVT   GERD (gastroesophageal reflux disease)    History of  hiatal hernia    Hypertension    Pneumonia    Primary localized osteoarthritis of knee 09/26/2014   Primary localized osteoarthrosis, shoulder region, rotator cuff arthropathy 10/04/2013   Sleep apnea    cpap    Medications:  Scheduled:   acetylcysteine  4 mL Nebulization BID   aspirin EC  81 mg Oral Daily   atorvastatin  80 mg Oral Daily   brimonidine  1 drop Both Eyes Q12H   calcium-vitamin D  1 tablet Oral Daily   doxycycline  100 mg Oral BID   fenofibrate  160 mg Oral Daily   ferrous sulfate  325 mg Oral Daily   fluticasone  2 spray Each Nare Daily   gabapentin  100 mg Oral TID   imipramine  25 mg Oral QHS   ipratropium-albuterol  3 mL Nebulization BID   latanoprost  1 drop Both Eyes QHS   magnesium oxide  400 mg Oral TID   metoprolol tartrate  25 mg Oral Q6H   mometasone-formoterol  2 puff Inhalation BID   multivitamin with minerals  1 tablet Oral Daily   pantoprazole  40 mg Oral Daily   polycarbophil  625 mg Oral BID   predniSONE  20 mg Oral Daily   sertraline  50 mg Oral QHS   sodium chloride  1 g Oral BID WC   timolol  1 drop Both Eyes BID   Infusions:   heparin 1,200 Units/hr (05/31/22 0207)   PRN: dextromethorphan-guaiFENesin, guaiFENesin-dextromethorphan, hydrALAZINE, ipratropium-albuterol, nitroGLYCERIN, ondansetron (ZOFRAN) IV, phenol  Assessment: Gavin Maxwell is a 77 y.o. male presenting with ACS/STEMI and new AF (CHADS-VASc 4). PMH significant for COPD, spontaneous pneumothorax (2019), CKD3, HTN, non-Hodgkin's lymphoma, GERD, PSVT/AVNRT s/p catheter ablation (2020), anemia, former TUD, obesity, OSA. Patient was not on [facility] PTA per chart review. Pharmacy has been consulted to dose apixaban.   Plan:  Discontinue heparin Start apixaban 5 mg BID Pharmacy will sign off and continue to monitor peripherally  Gretel Acre, PharmD PGY1 Pharmacy Resident 05/31/2022 12:08 PM

## 2022-05-31 NOTE — Progress Notes (Addendum)
Michae Kava Note    Patient Name: Gavin Maxwell Date of Encounter: 05/31/2022  [facility] Cardiologist: Ida Rogue, MD   Subjective   Remains in atrial fibrillation with controlled HR.  Denies any chest pain or SOB.   Inpatient Medications    Scheduled Meds:  acetylcysteine  4 mL Nebulization BID   aspirin EC  81 mg Oral Daily   atorvastatin  80 mg Oral Daily   brimonidine  1 drop Both Eyes Q12H   calcium-vitamin D  1 tablet Oral Daily   doxycycline  100 mg Oral BID   fenofibrate  160 mg Oral Daily   ferrous sulfate  325 mg Oral Daily   fluticasone  2 spray Each Nare Daily   gabapentin  100 mg Oral TID   imipramine  25 mg Oral QHS   ipratropium-albuterol  3 mL Nebulization BID   latanoprost  1 drop Both Eyes QHS   magnesium oxide  400 mg Oral TID   metoprolol tartrate  25 mg Oral Q6H   mometasone-formoterol  2 puff Inhalation BID   multivitamin with minerals  1 tablet Oral Daily   pantoprazole  40 mg Oral Daily   polycarbophil  625 mg Oral BID   predniSONE  20 mg Oral Daily   sertraline  50 mg Oral QHS   sodium chloride  1 g Oral BID WC   timolol  1 drop Both Eyes BID   Continuous Infusions:  heparin 1,200 Units/hr (05/31/22 0207)   PRN Meds: dextromethorphan-guaiFENesin, guaiFENesin-dextromethorphan, hydrALAZINE, ipratropium-albuterol, nitroGLYCERIN, ondansetron (ZOFRAN) IV, phenol   Vital Signs    Vitals:   05/31/22 0506 05/31/22 0533 05/31/22 0753 05/31/22 0756  BP: 110/76 101/73  106/74  Pulse: 97 94  98  Resp:  18  18  Temp:  (!) 97.5 F (36.4 C)  98.6 F (37 C)  TempSrc:  Oral    SpO2:  99% 96% 100%  Weight:  99.2 kg    Height:        Intake/Output Summary (Last 24 hours) at 05/31/2022 [id number] Last data filed at 05/31/2022 0900 Gross per 24 hour  Intake 1320 ml  Output 1900 ml  Net -580 ml       05/31/2022    5:33 AM 05/30/2022    4:04 AM 05/29/2022    5:00 AM  Last 3 Weights  Weight (lbs) 218 lb 11.1 oz 218 lb 14.7 oz 223 lb  12.3 oz  Weight (kg) 99.2 kg 99.3 kg 101.5 kg      Telemetry    Atrial fibrillation with CVR- Personally Reviewed  ECG    No new EKG to review - Personally Reviewed  Physical Exam   GEN: Well nourished, well developed in no acute distress HEENT: Normal NECK: No JVD; No carotid bruits LYMPHATICS: No lymphadenopathy CARDIAC:irregularly irregular, no murmurs, rubs, gallops RESPIRATORY: scattered expiratory wheezes ABDOMEN: Soft, non-tender, non-distended MUSCULOSKELETAL:  No edema; No deformity  SKIN: Warm and dry NEUROLOGIC:  Alert and oriented x 3 PSYCHIATRIC:  Normal affect  Labs    High Sensitivity Troponin:   Recent Labs  Lab 05/28/22 1009 05/28/22 1207 05/28/22 1510  TROPONINIHS 2,196* 1,998* 1,810*      Chemistry Recent Labs  Lab 05/28/22 1009 05/28/22 1207 05/29/22 0618 05/29/22 0957 05/29/22 1346 05/30/22 0630 05/31/22 0436  NA 127*   < > 130* 131* 131* 132* 132*  K 3.9   < > 4.1 4.3 4.3 4.0 4.3  CL 96*   < >  99 98 97* 103 105  CO2 19*   < > 20* 21* 23 20* 19*  GLUCOSE 121*   < > 98 105* 98 106* 112*  BUN 82*   < > 85* 85* 84* 74* 67*  CREATININE 4.20*   < > 3.71* 3.75* 3.55* 2.79* 2.25*  CALCIUM 8.5*   < > 8.1* 8.4* 8.2* 8.2* 8.5*  MG  --   --  1.8  --   --  1.9 1.8  PROT 6.7  --   --  6.3*  --   --   --   ALBUMIN 2.9*  --   --  2.6*  --   --   --   AST 128*  --   --  73*  --   --   --   ALT 56*  --   --  50*  --   --   --   ALKPHOS 44  --   --  46  --   --   --   BILITOT 0.8  --   --  0.9  --   --   --   GFRNONAA 14*   < > 16* 16* 17* 22* 29*  ANIONGAP 12   < > '11 12 11 9 8   '$ < > = values in this interval not displayed.     Lipids  Recent Labs  Lab 05/29/22 0618  CHOL 69  TRIG 154*  HDL 11*  LDLCALC 27  CHOLHDL 6.3     Hematology Recent Labs  Lab 05/29/22 0618 05/30/22 0630 05/31/22 0436  WBC 10.3 10.2 9.4  RBC 3.25* 3.34* 3.42*  HGB 10.3* 10.5* 10.8*  HCT 30.1* 31.5* 32.3*  MCV 92.6 94.3 94.4  MCH 31.7 31.4 31.6  MCHC  34.2 33.3 33.4  RDW 13.2 13.4 13.6  PLT 220 217 230    Thyroid  Recent Labs  Lab 05/28/22 1510  TSH 2.008     BNP Recent Labs  Lab 05/28/22 1009  BNP 215.1*     DDimer No results for input(s): "DDIMER" in the last 168 hours.   Radiology    No results found.  Cardiac Studies   Echo   1. Left ventricular ejection fraction, by estimation, is 60 to 65%. The  left ventricle has normal function. The left ventricle has no regional  wall motion abnormalities. There is moderate concentric left ventricular  hypertrophy. Left ventricular  diastolic parameters are indeterminate.   2. Right ventricular systolic function is normal. The right ventricular  size is normal.   3. The mitral valve is normal in structure. Mild mitral valve  regurgitation. No evidence of mitral stenosis.   4. The aortic valve is normal in structure. Aortic valve regurgitation is  not visualized. Aortic valve sclerosis/calcification is present, without  any evidence of aortic stenosis.   5. The inferior vena cava is normal in size with greater than 50%  respiratory variability, suggesting right atrial pressure of 3 mmHg.   Patient Profile     77 y.o. 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   Assessment & Plan    Acute respiratory failure -Concerning for viral syndrome the  respiratory panel negative -Began with bodyaches, diarrhea, coughing -On steroids, nebulizers, antibiotics -Slow improvement    Atrial fibrillation with RVR -Noted on presentation, rate control has been challenging secondary to hypotension -HR  fairly well controlled on Lopressor '25mg'$  QID -continue IV Heparin gtt with plans to transition to Eliquis at discharge -No immediate plans for cardioversion given respiratory distress   Non-STEMI -In the setting of respiratory failure, viral syndrome, atrial fibrillation with RVR, renal failure -Troponin 2100, trending downward -Normal LV function with no regional wall motion abnormality on echo>>trop felt to be related to demand ischemia  -continue IV Heparin gtt, Atorvastatin '80mg'$  daily and Lopressor -Not a good candidate for cardiac catheterization currently given renal failure -Large body habitus would make Myoview less appealing -If renal function improves back to baseline, could consider cardiac catheterization at a later date   Acute on chronic renal failure Baseline creatinine 1.8, presenting creatinine 4.2 likely ATN in the setting of viral syndrome Slow improvement, creatinine 2.25 today  I have spent a total of 35 minutes with patient reviewing 2D echo , telemetry, EKGs, labs and examining patient as well as establishing an assessment and plan that was discussed with the patient.  > 50% of time was spent in direct patient care.    For questions or updates, please contact [place] Please consult [email or web address] for contact info under        Signed, Fransico Him, MD  05/31/2022, 9:37 AM

## 2022-05-31 NOTE — Consult Note (Signed)
[place] for Heparin Indication: chest pain/ACS and afib.   Allergies  Allergen Reactions   Augmentin [Amoxicillin-Pot Clavulanate] Nausea Only    Did it involve swelling of the face/tongue/throat, SOB, or low BP? No Did it involve sudden or severe rash/hives, skin peeling, or any reaction on the inside of your mouth or nose? No Did you need to seek medical attention at a hospital or doctor's office? No When did it last happen? More than 10 years If all above answers are "NO", may proceed with cephalosporin use.    Celebrex [Celecoxib] Nausea Only   Oxycodone Other (See Comments)    Confusion, makes him "crazy"    Patient Measurements: Height: '5\' 11"'$  (180.3 cm) Weight: 99.2 kg (218 lb 11.1 oz) IBW/kg (Calculated) : 75.3 Heparin Dosing Weight: 97.7 kg  Vital Signs: Temp: 97.5 F (36.4 C) (02/24 0533) Temp Source: Oral (02/24 0533) BP: 101/73 (02/24 0533) Pulse Rate: 94 (02/24 0533)  Labs: Recent Labs    05/28/22 1009 05/28/22 1207 05/28/22 1510 05/28/22 2015 05/29/22 0618 05/29/22 0957 05/29/22 1346 05/30/22 0630 05/31/22 0436  HGB 11.7*  --   --   --  10.3*  --   --  10.5* 10.8*  HCT 34.6*  --   --   --  30.1*  --   --  31.5* 32.3*  PLT 259  --   --   --  220  --   --  217 230  APTT 37*  --   --   --   --   --   --   --   --   LABPROT 14.4  --   --   --   --   --   --   --   --   INR 1.1  --   --   --   --   --   --   --   --   HEPARINUNFRC  --   --   --    < > 0.46  --   --  0.36 0.46  CREATININE 4.20* 4.04*  --    < > 3.71*   < > 3.55* 2.79* 2.25*  TROPONINIHS 2,196* 1,998* 1,810*  --   --   --   --   --   --    < > = values in this interval not displayed.     Estimated Creatinine Clearance: 32 mL/min (A) (by C-G formula based on SCr of 2.25 mg/dL (H)).   Medical History: Past Medical History:  Diagnosis Date   Anxiety    Cancer ([employer])    non hodgins  lymphoma   2004 in remission  Left arm  wears a brace there is  a bone broken   CKD (chronic kidney disease) stage 3, GFR 30-59 ml/min (HCC)    COPD (chronic obstructive pulmonary disease) (HCC)    Depression    Dysrhythmia    SVT   GERD (gastroesophageal reflux disease)    History of hiatal hernia    Hypertension    Pneumonia    Primary localized osteoarthritis of knee 09/26/2014   Primary localized osteoarthrosis, shoulder region, rotator cuff arthropathy 10/04/2013   Sleep apnea    cpap    Medications:  No evidence of PTA anticoagulants  Assessment: Pharmacy consulted to dose heparin in this 25 to M presenting to the ED with ACS/STEMI.  CrCl 18, Troponin 2196. Not a good candidate for cardiac cath. Potentially plan to  do a stress test. CHADSVASc 4.   2/22 0618 0.46  2/23 0630 0.36 2/24 0436 0.46   Goal of Therapy:  Heparin level 0.3-0.7 units/ml Monitor platelets by anticoagulation protocol: Yes   Plan:  Heparin level is therapeutic. Will continue heparin infusion at 1200 units/hr. Recheck heparin level and CBC with AM labs. Plan to switch to apixaban at discharge.   Renda Rolls, PharmD, [facility] 05/31/2022 5:47 AM

## 2022-05-31 NOTE — Plan of Care (Signed)
  Problem: Education: Goal: Ability to demonstrate management of disease process will improve Outcome: Progressing   Problem: Activity: Goal: Capacity to carry out activities will improve Outcome: Progressing   Problem: Cardiac: Goal: Ability to achieve and maintain adequate cardiopulmonary perfusion will improve Outcome: Progressing   Problem: Education: Goal: Knowledge of disease or condition will improve Outcome: Progressing

## 2022-05-31 NOTE — Progress Notes (Signed)
PROGRESS NOTE    MITCHEAL RABBITT  [id number] DOB: 05-Jul-1945 DOA: 05/28/2022 PCP: Idelle Crouch, MD  [id number]/[id number]-AA  LOS: 3 days   Brief hospital course:   Assessment & Plan: KADIR NATIVIDAD is a 77 y.o. male with medical history significant of HTN, COPD not on oxygen at baseline, CKD 3A, anemia, OSA on CPAP, NHL in remission, SVT, who presented with shortness breath and diarrhea.   Patient states that has SOB for about 5 day, which has been progressively worsening.  Patient was seen by PCP on 2/19, and diagnosed with COPD exacerbation.  Patient given prescription for doxycycline and prednisone, without significant improvement.   Dyspnea and cough Acute respiratory failure ruled out --likely viral illness.  Resp panel neg, RVP neg. --Mucomyst neb BID and DuoNeb BID  NSTEMI (non-ST elevated myocardial infarction) [facility]):   --In the setting of respiratory failure, viral syndrome, atrial fibrillation with RVR, renal failure --Troponin 2100, trending downward Normal LV function with no regional wall motion abnormality on echo --started on heparin gtt.  Cardiology consulted.  Not a good candidate for cardiac catheterization given renal failure.  Large body habitus would make Myoview less appealing. Plan: --d/c heparin gtt today --Ischemic workup deferred for now.   -If renal function improves back to baseline, could consider cardiac catheterization at a later date    Chronic diastolic congestive heart failure ([place]) Ruled out acute exacerbation   COPD with acute exacerbation [facility]):  Currently patient does not have wheezing. -continue outpatient prescribed prednisone and doxycycline -Bronchodilators   Atrial fibrillation with RVR ([place]):  --new onset.  CHADS2 score is 5 (hypertension, age 15, CHF, CAD).  --cont Lopressor 25 mg q6h, per cardio --transition from heparin gtt to Eliquis today   Essential hypertension --BP varied, sometimes hypotensive --cont Lopressor 25 mg q6h  for rate control   Hyperlipidemia -Lipitor and fenofibrate   Acute renal failure superimposed on stage 3b chronic kidney disease ([place]):  --Baseline creatinine 1.8, presenting creatinine 4.2.  Likely due to dehydration secondary to diarrhea, and hemodynamic instability with NSAID use.  -Patient received 1 L normal saline bolus in the ED f/u MIVF.  Cr improved to 2.25 today. --nephro consulted Plan: -hold Mobic  Diarrhea -Check C. difficile and GI pathogen panel both neg   Abnormal LFTs, mild --unclear etiology -acute hepatitis panel neg   Hyponatremia:  Sodium 127, mental status normal.  Likely due to diarrhea --Na improved with IVF   Depression with anxiety --cont imipramine and sertraline   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[facility]) -In remission -Follow-up with hematology   Obesity with body mass index of 30.0-39.9: Body weight 106 kg, BMI 32.59 -Encourage to lose weight -Exercise and healthy diet   DVT prophylaxis: [id number] Code Status: Full code  Family Communication: wife updated at bedside today Level of care: Progressive Dispo:   The patient is from: home Anticipated d/c is to: likely home Anticipated d/c date is: likely tomorrow, after cardio and PT clear pt for discharge.   Subjective and Interval History:  Pt reported cough improved.  RN walked pt, and patient sat 92 % room air, he said he was getting dizzy, and weak, and wife concerned about taking pt home.  PT ordered.   Objective: Vitals:   05/31/22 1128 05/31/22 1158 05/31/22 1201 05/31/22 1527  BP: (!) '92/48 90/70 91/62 '$ 95/63  Pulse: 89 86 92 99  Resp: '18 18  18  '$ Temp: 98.6 F (37 C)   (!) 97.5 F (36.4 C)  TempSrc:  SpO2: 97% 99% 100% 97%  Weight:      Height:        Intake/Output Summary (Last 24 hours) at 05/31/2022 1714 Last data filed at 05/31/2022 1320 Gross per 24 hour  Intake 1560 ml  Output 1651 ml  Net -91 ml   Filed Weights   05/29/22 0500 05/30/22 0404 05/31/22 0533  Weight:  101.5 kg 99.3 kg 99.2 kg    Examination:   Constitutional: NAD, AAOx3 HEENT: conjunctivae and lids normal, EOMI, voice hoarse CV: No cyanosis.   RESP: normal respiratory effort, on RA Neuro: II - XII grossly intact.   Psych: Normal mood and affect.  Appropriate judgement and reason   Data Reviewed: I have personally reviewed labs and imaging studies  Time spent: 35 minutes  Enzo Bi, MD [facility] If 7PM-7AM, please contact night-coverage 05/31/2022, 5:14 PM

## 2022-05-31 NOTE — Progress Notes (Signed)
[facility]  ROUNDING NOTE   Subjective:   Patient complains of indigestion this morning. He states he ate his breakfast.   Creatinine 2.25 (2.79) UOP 1900  Denies chest pain.   Objective:  Vital signs in last 24 hours:  Temp:  [97.5 F (36.4 C)-98.7 F (37.1 C)] 98.6 F (37 C) (02/24 0756) Pulse Rate:  [72-98] 98 (02/24 0756) Resp:  [16-18] 18 (02/24 0756) BP: (89-114)/(53-84) 106/74 (02/24 0756) SpO2:  [96 %-100 %] 100 % (02/24 0756) Weight:  [99.2 kg] 99.2 kg (02/24 0533)  Weight change: -0.1 kg Filed Weights   05/29/22 0500 05/30/22 0404 05/31/22 0533  Weight: 101.5 kg 99.3 kg 99.2 kg    Intake/Output: I/O last 3 completed shifts: In: 1193.3 [P.O.:1080; I.V.:113.3] Out: 2100 [Urine:2100]   Intake/Output this shift:  Total I/O In: 480 [P.O.:480] Out: -   Physical Exam: General: NAD, sitting up in bed  Head: Normocephalic, atraumatic. Moist oral mucosal membranes  Eyes: Anicteric  Lungs:  Clear to auscultation, 2L [place] O2  Heart: irregular  Abdomen:  Soft, nontender  Extremities:  no peripheral edema.  Neurologic: Alert and oriented, moving all four extremities  Skin: No lesions  Access: None    Basic Metabolic Panel: Recent Labs  Lab 05/29/22 0618 05/29/22 0957 05/29/22 1346 05/30/22 0630 05/31/22 0436  NA 130* 131* 131* 132* 132*  K 4.1 4.3 4.3 4.0 4.3  CL 99 98 97* 103 105  CO2 20* 21* 23 20* 19*  GLUCOSE 98 105* 98 106* 112*  BUN 85* 85* 84* 74* 67*  CREATININE 3.71* 3.75* 3.55* 2.79* 2.25*  CALCIUM 8.1* 8.4* 8.2* 8.2* 8.5*  MG 1.8  --   --  1.9 1.8  PHOS  --   --   --  3.0  --      Liver Function Tests: Recent Labs  Lab 05/28/22 1009 05/29/22 0957  AST 128* 73*  ALT 56* 50*  ALKPHOS 44 46  BILITOT 0.8 0.9  PROT 6.7 6.3*  ALBUMIN 2.9* 2.6*    No results for input(s): "LIPASE", "AMYLASE" in the last 168 hours. No results for input(s): "AMMONIA" in the last 168 hours.  CBC: Recent Labs  Lab 05/28/22 1009  05/29/22 0618 05/30/22 0630 05/31/22 0436  WBC 10.2 10.3 10.2 9.4  NEUTROABS 8.5*  --   --   --   HGB 11.7* 10.3* 10.5* 10.8*  HCT 34.6* 30.1* 31.5* 32.3*  MCV 93.8 92.6 94.3 94.4  PLT 259 220 217 230     Cardiac Enzymes: No results for input(s): "CKTOTAL", "CKMB", "CKMBINDEX", "TROPONINI" in the last 168 hours.  BNP: Invalid input(s): "POCBNP"  CBG: No results for input(s): "GLUCAP" in the last 168 hours.  Microbiology: Results for orders placed or performed during the hospital encounter of 05/28/22  Resp panel by RT-PCR (RSV, Flu A&B, Covid)     Status: None   Collection Time: 05/29/22  9:54 AM  Result Value Ref Range Status   SARS Coronavirus 2 by RT PCR NEGATIVE NEGATIVE Final    Comment: (NOTE) SARS-CoV-2 target nucleic acids are NOT DETECTED.  The SARS-CoV-2 RNA is generally detectable in upper respiratory specimens during the acute phase of infection. The lowest concentration of SARS-CoV-2 viral copies this assay can detect is 138 copies/mL. A negative result does not preclude SARS-Cov-2 infection and should not be used as the sole basis for treatment or other patient management decisions. A negative result may occur with  improper specimen collection/handling, submission of specimen other  than nasopharyngeal swab, presence of viral mutation(s) within the areas targeted by this assay, and inadequate number of viral copies(<138 copies/mL). A negative result must be combined with clinical observations, patient history, and epidemiological information. The expected result is Negative.  Fact Sheet for Patients:  EntrepreneurPulse.com.au  Fact Sheet for Healthcare Providers:  IncredibleEmployment.be  This test is no t yet approved or cleared by the [place] FDA and  has been authorized for detection and/or diagnosis of SARS-CoV-2 by FDA under an Emergency Use Authorization (EUA). This EUA will remain  in effect (meaning  this test can be used) for the duration of the COVID-19 declaration under Section 564(b)(1) of the Act, 21 U.S.C.section 360bbb-3(b)(1), unless the authorization is terminated  or revoked sooner.       Influenza A by PCR NEGATIVE NEGATIVE Final   Influenza B by PCR NEGATIVE NEGATIVE Final    Comment: (NOTE) The Xpert Xpress SARS-CoV-2/FLU/RSV plus assay is intended as an aid in the diagnosis of influenza from Nasopharyngeal swab specimens and should not be used as a sole basis for treatment. Nasal washings and aspirates are unacceptable for Xpert Xpress SARS-CoV-2/FLU/RSV testing.  Fact Sheet for Patients: EntrepreneurPulse.com.au  Fact Sheet for Healthcare Providers: IncredibleEmployment.be  This test is not yet approved or cleared by the [place] FDA and has been authorized for detection and/or diagnosis of SARS-CoV-2 by FDA under an Emergency Use Authorization (EUA). This EUA will remain in effect (meaning this test can be used) for the duration of the COVID-19 declaration under Section 564(b)(1) of the Act, 21 U.S.C. section 360bbb-3(b)(1), unless the authorization is terminated or revoked.     Resp Syncytial Virus by PCR NEGATIVE NEGATIVE Final    Comment: (NOTE) Fact Sheet for Patients: EntrepreneurPulse.com.au  Fact Sheet for Healthcare Providers: IncredibleEmployment.be  This test is not yet approved or cleared by the [place] FDA and has been authorized for detection and/or diagnosis of SARS-CoV-2 by FDA under an Emergency Use Authorization (EUA). This EUA will remain in effect (meaning this test can be used) for the duration of the COVID-19 declaration under Section 564(b)(1) of the Act, 21 U.S.C. section 360bbb-3(b)(1), unless the authorization is terminated or revoked.  Performed at [facility], [place], [place] [address]   Respiratory (~20  pathogens) panel by PCR     Status: None   Collection Time: 05/29/22  5:16 PM   Specimen: Nasopharyngeal Swab; Respiratory  Result Value Ref Range Status   Adenovirus NOT DETECTED NOT DETECTED Final   Coronavirus 229E NOT DETECTED NOT DETECTED Final    Comment: (NOTE) The Coronavirus on the Respiratory Panel, DOES NOT test for the novel  Coronavirus (2019 nCoV)    Coronavirus HKU1 NOT DETECTED NOT DETECTED Final   Coronavirus NL63 NOT DETECTED NOT DETECTED Final   Coronavirus OC43 NOT DETECTED NOT DETECTED Final   Metapneumovirus NOT DETECTED NOT DETECTED Final   Rhinovirus / Enterovirus NOT DETECTED NOT DETECTED Final   Influenza A NOT DETECTED NOT DETECTED Final   Influenza B NOT DETECTED NOT DETECTED Final   Parainfluenza Virus 1 NOT DETECTED NOT DETECTED Final   Parainfluenza Virus 2 NOT DETECTED NOT DETECTED Final   Parainfluenza Virus 3 NOT DETECTED NOT DETECTED Final   Parainfluenza Virus 4 NOT DETECTED NOT DETECTED Final   Respiratory Syncytial Virus NOT DETECTED NOT DETECTED Final   Bordetella pertussis NOT DETECTED NOT DETECTED Final   Bordetella Parapertussis NOT DETECTED NOT DETECTED Final   Chlamydophila pneumoniae NOT DETECTED NOT  DETECTED Final   Mycoplasma pneumoniae NOT DETECTED NOT DETECTED Final    Comment: Performed at [facility], [place] [address], [place], [place] [address]  C Difficile Quick Screen w PCR reflex     Status: None   Collection Time: 05/30/22 12:05 AM   Specimen: STOOL  Result Value Ref Range Status   C Diff antigen NEGATIVE NEGATIVE Final   C Diff toxin NEGATIVE NEGATIVE Final   C Diff interpretation No C. difficile detected.  Final    Comment: Performed at [facility], [address] [place], [place] [address]  Gastrointestinal Panel by PCR , Stool     Status: None   Collection Time: 05/30/22 12:05 AM   Specimen: Stool  Result Value Ref Range Status   Campylobacter species NOT DETECTED NOT DETECTED Final   Plesimonas  shigelloides NOT DETECTED NOT DETECTED Final   Salmonella species NOT DETECTED NOT DETECTED Final   Yersinia enterocolitica NOT DETECTED NOT DETECTED Final   Vibrio species NOT DETECTED NOT DETECTED Final   Vibrio cholerae NOT DETECTED NOT DETECTED Final   Enteroaggregative E coli (EAEC) NOT DETECTED NOT DETECTED Final   Enteropathogenic E coli (EPEC) NOT DETECTED NOT DETECTED Final   Enterotoxigenic E coli (ETEC) NOT DETECTED NOT DETECTED Final   Shiga like toxin producing E coli (STEC) NOT DETECTED NOT DETECTED Final   Shigella/Enteroinvasive E coli (EIEC) NOT DETECTED NOT DETECTED Final   Cryptosporidium NOT DETECTED NOT DETECTED Final   Cyclospora cayetanensis NOT DETECTED NOT DETECTED Final   Entamoeba histolytica NOT DETECTED NOT DETECTED Final   Giardia lamblia NOT DETECTED NOT DETECTED Final   Adenovirus F40/41 NOT DETECTED NOT DETECTED Final   Astrovirus NOT DETECTED NOT DETECTED Final   Norovirus GI/GII NOT DETECTED NOT DETECTED Final   Rotavirus A NOT DETECTED NOT DETECTED Final   Sapovirus (I, II, IV, and V) NOT DETECTED NOT DETECTED Final    Comment: Performed at [facility], [address] [place], [place] [address]    Coagulation Studies: Recent Labs    05/28/22 1009  LABPROT 14.4  INR 1.1     Urinalysis: Recent Labs    05/28/22 1518  COLORURINE YELLOW*  LABSPEC 1.018  PHURINE 5.0  GLUCOSEU NEGATIVE  HGBUR MODERATE*  BILIRUBINUR NEGATIVE  KETONESUR NEGATIVE  PROTEINUR 30*  NITRITE NEGATIVE  LEUKOCYTESUR NEGATIVE       Imaging: No results found.   Medications:    heparin 1,200 Units/hr (05/31/22 0207)    acetylcysteine  4 mL Nebulization BID   aspirin EC  81 mg Oral Daily   atorvastatin  80 mg Oral Daily   brimonidine  1 drop Both Eyes Q12H   calcium-vitamin D  1 tablet Oral Daily   doxycycline  100 mg Oral BID   fenofibrate  160 mg Oral Daily   ferrous sulfate  325 mg Oral Daily   fluticasone  2 spray Each Nare Daily    gabapentin  100 mg Oral TID   imipramine  25 mg Oral QHS   ipratropium-albuterol  3 mL Nebulization BID   latanoprost  1 drop Both Eyes QHS   magnesium oxide  400 mg Oral TID   metoprolol tartrate  25 mg Oral Q6H   mometasone-formoterol  2 puff Inhalation BID   multivitamin with minerals  1 tablet Oral Daily   pantoprazole  40 mg Oral Daily   polycarbophil  625 mg Oral BID   predniSONE  20 mg Oral Daily   sertraline  50 mg Oral QHS   sodium chloride  1 g Oral BID WC   timolol  1 drop Both Eyes BID   dextromethorphan-guaiFENesin, guaiFENesin-dextromethorphan, hydrALAZINE, ipratropium-albuterol, nitroGLYCERIN, ondansetron (ZOFRAN) IV, phenol  Assessment/ Plan:  [name] is a 77 y.o.  male with past medical conditions including hyperlipidemia, COPD, hypertension, anemia, depression and anxiety, obstructive sleep apnea with CPAP, and chronic kidney disease stage IIIa, who was admitted to [facility] on 05/28/2022 for NSTEMI (non-ST elevated myocardial infarction) ([place]) [I21.4] Atrial fibrillation with rapid ventricular response ([place]) [I48.91] Acute renal failure superimposed on chronic kidney disease, unspecified acute renal failure type, unspecified CKD stage ([facility]) [N17.9, N18.9]   Acute kidney injury with proteinuria on chronic kidney disease IIIa. Baseline creatinine 1.8 with GFR 38 on 02/22/23. Acute kidney injury likely secondary to hemodynamic instability with NSAID use. NSTEMI on admission. UA positive for protein.   Creatinine continues to improve with adequate urine output.  Lab Results  Component Value Date   CREATININE 2.25 (H) 05/31/2022   CREATININE 2.79 (H) 05/30/2022   CREATININE 3.55 (H) 05/29/2022    Intake/Output Summary (Last 24 hours) at 05/31/2022 1000 Last data filed at 05/31/2022 0900 Gross per 24 hour  Intake 1320 ml  Output 1900 ml  Net -580 ml    2. Anemia of chronic kidney disease Lab Results  Component Value Date   HGB 10.8 (L) 05/31/2022    Hgb  stable  3. Hypertension with chronic kidney disease. Home regimen includes metoprolol which is helping with rate control.    LOS: 3 Betzayda Braxton 2/24/202410:00 AM

## 2022-06-01 DIAGNOSIS — J9601 Acute respiratory failure with hypoxia: Secondary | ICD-10-CM

## 2022-06-01 DIAGNOSIS — I4891 Unspecified atrial fibrillation: Secondary | ICD-10-CM | POA: Diagnosis not present

## 2022-06-01 DIAGNOSIS — N179 Acute kidney failure, unspecified: Secondary | ICD-10-CM | POA: Diagnosis not present

## 2022-06-01 DIAGNOSIS — I214 Non-ST elevation (NSTEMI) myocardial infarction: Secondary | ICD-10-CM | POA: Diagnosis not present

## 2022-06-01 LAB — BASIC METABOLIC PANEL
Anion gap: 7 (ref 5–15)
BUN: 57 mg/dL — ABNORMAL HIGH (ref 8–23)
CO2: 21 mmol/L — ABNORMAL LOW (ref 22–32)
Calcium: 8.6 mg/dL — ABNORMAL LOW (ref 8.9–10.3)
Chloride: 105 mmol/L (ref 98–111)
Creatinine, Ser: 1.88 mg/dL — ABNORMAL HIGH (ref 0.61–1.24)
GFR, Estimated: 36 mL/min — ABNORMAL LOW (ref >60)
Glucose, Bld: 90 mg/dL (ref 70–99)
Potassium: 4.2 mmol/L (ref 3.5–5.1)
Sodium: 133 mmol/L — ABNORMAL LOW (ref 135–145)

## 2022-06-01 LAB — CBC
HCT: 30.6 % — ABNORMAL LOW (ref 39.0–52.0)
Hemoglobin: 10.2 g/dL — ABNORMAL LOW (ref 13.0–17.0)
MCH: 31.3 pg (ref 26.0–34.0)
MCHC: 33.3 g/dL (ref 30.0–36.0)
MCV: 93.9 fL (ref 80.0–100.0)
Platelets: 255 10*3/uL (ref 150–400)
RBC: 3.26 MIL/uL — ABNORMAL LOW (ref 4.22–5.81)
RDW: 13.4 % (ref 11.5–15.5)
WBC: 8.8 10*3/uL (ref 4.0–10.5)
nRBC: 0 % (ref 0.0–0.2)

## 2022-06-01 LAB — MAGNESIUM: Magnesium: 1.6 mg/dL — ABNORMAL LOW (ref 1.7–2.4)

## 2022-06-01 MED ORDER — POLYETHYLENE GLYCOL 3350 17 G PO PACK
17.0000 g | PACK | Freq: Two times a day (BID) | ORAL | Status: DC
  Administered 2022-06-01 – 2022-06-02 (×2): 17 g via ORAL
  Filled 2022-06-01 (×4): qty 1

## 2022-06-01 MED ORDER — LEVALBUTEROL HCL 0.63 MG/3ML IN NEBU
0.6300 mg | INHALATION_SOLUTION | Freq: Four times a day (QID) | RESPIRATORY_TRACT | Status: DC | PRN
  Administered 2022-06-01 – 2022-06-02 (×2): 0.63 mg via RESPIRATORY_TRACT
  Filled 2022-06-01 (×2): qty 3

## 2022-06-01 MED ORDER — TRAMADOL HCL 50 MG PO TABS
50.0000 mg | ORAL_TABLET | Freq: Once | ORAL | Status: AC
  Administered 2022-06-01: 50 mg via ORAL
  Filled 2022-06-01: qty 1

## 2022-06-01 MED ORDER — AMIODARONE LOAD VIA INFUSION
150.0000 mg | Freq: Once | INTRAVENOUS | Status: AC
  Administered 2022-06-01: 150 mg via INTRAVENOUS
  Filled 2022-06-01: qty 83.34

## 2022-06-01 MED ORDER — ORAL CARE MOUTH RINSE: 15.0000 mL | OROMUCOSAL | Status: DC | PRN

## 2022-06-01 MED ORDER — AMIODARONE HCL IN DEXTROSE 360-4.14 MG/200ML-% IV SOLN
60.0000 mg/h | INTRAVENOUS | Status: DC
  Administered 2022-06-01 (×2): 60 mg/h via INTRAVENOUS
  Filled 2022-06-01: qty 200

## 2022-06-01 MED ORDER — AMIODARONE HCL IN DEXTROSE 360-4.14 MG/200ML-% IV SOLN
60.0000 mg/h | INTRAVENOUS | Status: DC
  Administered 2022-06-01 – 2022-06-02 (×3): 30 mg/h via INTRAVENOUS
  Administered 2022-06-02 – 2022-06-03 (×3): 60 mg/h via INTRAVENOUS
  Filled 2022-06-01 (×7): qty 200

## 2022-06-01 NOTE — Progress Notes (Addendum)
Gavin Maxwell Note    Patient Name: Gavin Maxwell Date of Encounter: 06/01/2022  [place] Cardiologist: Ida Rogue, MD   Subjective   Heart rates have been all over the place anywhere from upper 50s to 130s.  When he is sitting and resting his heart rate is controlled but when he gets up to do any kind of movement it goes up into the 130s.  Patient is short of breath especially with any type of exertion.  He has not gotten his last 4 doses of metoprolol because of soft blood pressure and at times low heart rate but then heart rate goes up into the 130s.  Inpatient Medications    Scheduled Meds:  acetylcysteine  4 mL Nebulization BID   apixaban  5 mg Oral BID   aspirin EC  81 mg Oral Daily   atorvastatin  80 mg Oral Daily   brimonidine  1 drop Both Eyes Q12H   calcium-vitamin D  1 tablet Oral Daily   doxycycline  100 mg Oral BID   fenofibrate  160 mg Oral Daily   ferrous sulfate  325 mg Oral Daily   fluticasone  2 spray Each Nare Daily   gabapentin  100 mg Oral TID   imipramine  25 mg Oral QHS   ipratropium-albuterol  3 mL Nebulization BID   latanoprost  1 drop Both Eyes QHS   magnesium oxide  400 mg Oral TID   metoprolol tartrate  25 mg Oral Q6H   mometasone-formoterol  2 puff Inhalation BID   multivitamin with minerals  1 tablet Oral Daily   pantoprazole  40 mg Oral Daily   polycarbophil  625 mg Oral BID   predniSONE  20 mg Oral Daily   sertraline  50 mg Oral QHS   sodium chloride  1 g Oral BID WC   timolol  1 drop Both Eyes BID   Continuous Infusions:   PRN Meds: dextromethorphan-guaiFENesin, guaiFENesin-dextromethorphan, hydrALAZINE, ipratropium-albuterol, nitroGLYCERIN, ondansetron (ZOFRAN) IV, mouth rinse, phenol   Vital Signs    Vitals:   06/01/22 0440 06/01/22 0441 06/01/22 0734 06/01/22 0745  BP: 115/73   117/84  Pulse: (!) 56   (!) 59  Resp: 18   18  Temp: 97.6 F (36.4 C)   98.2 F (36.8 C)  TempSrc: Oral     SpO2: 96%  96% 100%   Weight:  99.7 kg    Height:        Intake/Output Summary (Last 24 hours) at 06/01/2022 1014 Last data filed at 06/01/2022 0443 Gross per 24 hour  Intake 480 ml  Output 1851 ml  Net -1371 ml       06/01/2022    4:41 AM 05/31/2022    5:33 AM 05/30/2022    4:04 AM  Last 3 Weights  Weight (lbs) 219 lb 12.8 oz 218 lb 11.1 oz 218 lb 14.7 oz  Weight (kg) 99.7 kg 99.2 kg 99.3 kg      Telemetry    Fibrillation with RVR in the 130s- Personally Reviewed  ECG    No new EKG to review - Personally Reviewed  Physical Exam  GEN: Well nourished, well developed in no acute distress HEENT: Normal NECK: No JVD; No carotid bruits LYMPHATICS: No lymphadenopathy CARDIAC: Irregularly irregular and tachycardic, no murmurs, rubs, gallops RESPIRATORY: Crackles at bases bilaterally ABDOMEN: Soft, non-tender, non-distended MUSCULOSKELETAL:  No edema; No deformity  SKIN: Warm and dry NEUROLOGIC:  Alert and oriented x 3 PSYCHIATRIC:  Normal affect  Labs    High Sensitivity Troponin:   Recent Labs  Lab 05/28/22 1009 05/28/22 1207 05/28/22 1510  TROPONINIHS 2,196* 1,998* 1,810*      Chemistry Recent Labs  Lab 05/28/22 1009 05/28/22 1207 05/29/22 0957 05/29/22 1346 05/30/22 0630 05/31/22 0436 06/01/22 0500  NA 127*   < > 131*   < > 132* 132* 133*  K 3.9   < > 4.3   < > 4.0 4.3 4.2  CL 96*   < > 98   < > 103 105 105  CO2 19*   < > 21*   < > 20* 19* 21*  GLUCOSE 121*   < > 105*   < > 106* 112* 90  BUN 82*   < > 85*   < > 74* 67* 57*  CREATININE 4.20*   < > 3.75*   < > 2.79* 2.25* 1.88*  CALCIUM 8.5*   < > 8.4*   < > 8.2* 8.5* 8.6*  MG  --    < >  --   --  1.9 1.8 1.6*  PROT 6.7  --  6.3*  --   --   --   --   ALBUMIN 2.9*  --  2.6*  --   --   --   --   AST 128*  --  73*  --   --   --   --   ALT 56*  --  50*  --   --   --   --   ALKPHOS 44  --  46  --   --   --   --   BILITOT 0.8  --  0.9  --   --   --   --   GFRNONAA 14*   < > 16*   < > 22* 29* 36*  ANIONGAP 12   < > 12   < > '9  8 7   '$ < > = values in this interval not displayed.     Lipids  Recent Labs  Lab 05/29/22 0618  CHOL 69  TRIG 154*  HDL 11*  LDLCALC 27  CHOLHDL 6.3     Hematology Recent Labs  Lab 05/30/22 0630 05/31/22 0436 06/01/22 0500  WBC 10.2 9.4 8.8  RBC 3.34* 3.42* 3.26*  HGB 10.5* 10.8* 10.2*  HCT 31.5* 32.3* 30.6*  MCV 94.3 94.4 93.9  MCH 31.4 31.6 31.3  MCHC 33.3 33.4 33.3  RDW 13.4 13.6 13.4  PLT 217 230 255    Thyroid  Recent Labs  Lab 05/28/22 1510  TSH 2.008     BNP Recent Labs  Lab 05/28/22 1009  BNP 215.1*     DDimer No results for input(s): "DDIMER" in the last 168 hours.   Radiology    No results found.  Cardiac Studies   Echo   1. Left ventricular ejection fraction, by estimation, is 60 to 65%. The  left ventricle has normal function. The left ventricle has no regional  wall motion abnormalities. There is moderate concentric left ventricular  hypertrophy. Left ventricular  diastolic parameters are indeterminate.   2. Right ventricular systolic function is normal. The right ventricular  size is normal.   3. The mitral valve is normal in structure. Mild mitral valve  regurgitation. No evidence of mitral stenosis.   4. The aortic valve is normal in structure. Aortic valve regurgitation is  not visualized. Aortic valve sclerosis/calcification is present, without  any evidence of aortic stenosis.  5. The inferior vena cava is normal in size with greater than 50%  respiratory variability, suggesting right atrial pressure of 3 mmHg.   Patient Profile     77 y.o. 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   Assessment & Plan    Acute respiratory failure -Concerning for viral syndrome the respiratory panel negative -Began with bodyaches, diarrhea, coughing -On steroids, nebulizers, antibiotics -Slow improvement  -Last TRH to change nebulizer so he is not getting albuterol which can drive his A-fib   Atrial fibrillation with RVR -Noted on presentation, rate control has been challenging secondary to hypotension -His metoprolol has been held for the last 3 doses due to bradycardia and soft BP -Rate in epic has been charted is in the 50s but after talking to the nursing staff and looking at telemetry it looks like he is having episodes of A-fib with RVR up into the 130s and then at rest heart rate goes into the upper 50s he is symptomatic with palpitations and shortness of breath with any type of movement when his heart rate increases -His soft BP at times I am going to stop metoprolol altogether -Start IV Amio drip -Will ask TRH to change nebulizers so that he is not getting albuterol -Has been transition from IV heparin to Eliquis 5 mg twice daily -If we cannot get heart rate controlled may need TEE/DCCV prior to discharge   Non-STEMI -In the setting of respiratory failure, viral syndrome, atrial fibrillation with RVR, renal failure -Troponin 2100, trending downward -Normal LV function with no regional wall motion abnormality on echo>>trop felt to be related to demand ischemia in setting of acute respiratory failure and A-fib with RVR -Not a good candidate for cardiac catheterization currently given renal failure -Large body habitus would make Myoview less appealing -If renal function improves back to baseline, could consider cardiac catheterization at a later date as coronary CTA if normal sinus rhythm is restored -Continue aspirin 81 mg daily, atorvastatin 80 mg daily  -Beta blocker stopped due to soft BP   Acute on chronic renal failure Baseline  creatinine 1.8, presenting creatinine 4.2 likely ATN in the setting of viral syndrome Slow improvement, creatinine decreased down to 1.88 today  I have spent a total of 35 minutes with patient reviewing 2D echo , telemetry, EKGs, labs and examining patient as well as establishing an assessment and plan that was discussed with the patient.  > 50% of time was spent in direct patient care.    For questions or updates, please contact [place] Please consult [email or web address] for contact info under        Signed, Fransico Him, MD  06/01/2022, 10:14 AM

## 2022-06-01 NOTE — Plan of Care (Signed)
  Problem: Education: Goal: Ability to demonstrate management of disease process will improve Outcome: Progressing Goal: Ability to verbalize understanding of medication therapies will improve Outcome: Progressing Goal: Individualized Educational Video(s) Outcome: Progressing   Problem: Activity: Goal: Capacity to carry out activities will improve Outcome: Progressing   Problem: Cardiac: Goal: Ability to achieve and maintain adequate cardiopulmonary perfusion will improve Outcome: Progressing   Problem: Education: Goal: Knowledge of disease or condition will improve Outcome: Progressing Goal: Knowledge of the prescribed therapeutic regimen will improve Outcome: Progressing Goal: Individualized Educational Video(s) Outcome: Progressing   Problem: Activity: Goal: Ability to tolerate increased activity will improve Outcome: Progressing Goal: Will verbalize the importance of balancing activity with adequate rest periods Outcome: Progressing   Problem: Respiratory: Goal: Ability to maintain a clear airway will improve Outcome: Progressing Goal: Levels of oxygenation will improve Outcome: Progressing Goal: Ability to maintain adequate ventilation will improve Outcome: Progressing   Problem: Education: Goal: Knowledge of General Education information will improve Description Including pain rating scale, medication(s)/side effects and non-pharmacologic comfort measures Outcome: Progressing   Problem: Health Behavior/Discharge Planning: Goal: Ability to manage health-related needs will improve Outcome: Progressing   Problem: Clinical Measurements: Goal: Ability to maintain clinical measurements within normal limits will improve Outcome: Progressing Goal: Will remain free from infection Outcome: Progressing Goal: Diagnostic test results will improve Outcome: Progressing Goal: Respiratory complications will improve Outcome: Progressing Goal: Cardiovascular complication  will be avoided Outcome: Progressing   Problem: Activity: Goal: Risk for activity intolerance will decrease Outcome: Progressing   Problem: Nutrition: Goal: Adequate nutrition will be maintained Outcome: Progressing   Problem: Coping: Goal: Level of anxiety will decrease Outcome: Progressing   Problem: Elimination: Goal: Will not experience complications related to bowel motility Outcome: Progressing Goal: Will not experience complications related to urinary retention Outcome: Progressing   Problem: Pain Managment: Goal: General experience of comfort will improve Outcome: Progressing   Problem: Safety: Goal: Ability to remain free from injury will improve Outcome: Progressing   Problem: Skin Integrity: Goal: Risk for impaired skin integrity will decrease Outcome: Progressing   

## 2022-06-01 NOTE — Progress Notes (Signed)
[facility] NAME: Gavin Maxwell    MR#:  [id number]  DATE OF BIRTH:  June 08, 1945  SUBJECTIVE:   Life at bedside. Patient continues to have rapid heart rate eight max up to 170s. He also Bradys down in between. Denies any chest pain. Did have shortness of breath with elevated heart rate when earlier tried to work with therapy. No cough or fever.   VITALS:  Blood pressure 110/64, pulse 63, temperature 98.1 F (36.7 C), resp. rate 20, height '5\' 11"'$  (1.803 m), weight 99.7 kg, SpO2 91 %.  PHYSICAL EXAMINATION:   GENERAL:  77 y.o.-year-old patient with no acute distress. Morbid obesity LUNGS: Normal breath sounds bilaterally, no wheezing CARDIOVASCULAR: S1, S2 normal. No murmur tachycardia ABDOMEN: Soft, nontender, nondistended. Bowel sounds present.  EXTREMITIES: +  edema b/l.    NEUROLOGIC: nonfocal  patient is alert and awake SKIN: No obvious rash, lesion, or ulcer.   LABORATORY PANEL:  CBC Recent Labs  Lab 06/01/22 0500  WBC 8.8  HGB 10.2*  HCT 30.6*  PLT 255    Chemistries  Recent Labs  Lab 05/29/22 0957 05/29/22 1346 06/01/22 0500  NA 131*   < > 133*  K 4.3   < > 4.2  CL 98   < > 105  CO2 21*   < > 21*  GLUCOSE 105*   < > 90  BUN 85*   < > 57*  CREATININE 3.75*   < > 1.88*  CALCIUM 8.4*   < > 8.6*  MG  --    < > 1.6*  AST 73*  --   --   ALT 50*  --   --   ALKPHOS 46  --   --   BILITOT 0.9  --   --    < > = values in this interval not displayed.   Assessment and Plan  Gavin Maxwell is a 77 y.o. male with medical history significant of HTN, COPD not on oxygen at baseline, CKD 3A, anemia, OSA on CPAP, NHL in remission, SVT, who presented with shortness breath and diarrhea.   Patient states that has SOB for about 5 day, which has been progressively worsening.  Patient was seen by PCP on 2/19, and diagnosed with COPD exacerbation.  Patient given prescription for doxycycline and prednisone, without  significant improvement.   Dyspnea and cough Acute respiratory failure ruled out --likely viral illness.  Resp panel neg, RVP neg. --Mucomyst neb BID and xopenex BID (due o tachy)   NSTEMI (non-ST elevated myocardial infarction) ([place]):   --In the setting of respiratory failure, viral syndrome, atrial fibrillation with RVR, renal failure --Troponin 2100, trending downward Normal LV function with no regional wall motion abnormality on echo --started on heparin gtt.  Cardiology consulted.  Not a good candidate for cardiac catheterization given renal failure. Change to po eliquis. Large body habitus would make Myoview less appealing. --Ischemic workup deferred for now.   -If renal function improves back to baseline, could consider cardiac catheterization at a later date    Chronic diastolic congestive heart failure ([place]) Ruled out acute exacerbation   COPD with acute exacerbation [facility]):  Currently patient does not have wheezing. -continue outpatient prescribed prednisone and doxycycline -Bronchodilators   Atrial fibrillation with RVR ([place]):  --new onset.  CHADS2 score is 5 (hypertension, age 7, CHF, CAD).  --transition from heparin gtt to Eliquis --IV Amiodarone gtt per cardiology. Hold BB   Essential hypertension --  BP varied, sometimes hypotensive  control   Hyperlipidemia -Lipitor and fenofibrate   Acute renal failure superimposed on stage 3b chronic kidney disease ([place]):  --Baseline creatinine 1.8, presenting creatinine 4.2.  Likely due to dehydration secondary to diarrhea, and hemodynamic instability with NSAID use.  -Patient received 1 L normal saline bolus in the ED f/u MIVF.  Cr improved to 1.8 today. --nephro consulted -hold Mobic   Diarrhea - C. difficile and GI pathogen panel both neg   Abnormal LFTs, mild --unclear etiology -acute hepatitis panel neg   Hyponatremia:  Sodium 127, mental status normal.  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  >50% time spent on counselling and coordination of care  Note: This dictation was prepared with Dragon dictation along with smaller phrase technology. Any transcriptional errors that result from this process are unintentional.  Fritzi Mandes M.D    [facility]   CC: Primary care physician; Idelle Crouch, MD

## 2022-06-01 NOTE — Progress Notes (Signed)
[facility]  ROUNDING NOTE   Subjective:   Patient seen sitting in chair Room air, denies shortness of breath Trace lower extremity edema Generalized pain, requesting pain medication.   Creatinine 1.88 (2.25) (2.79) UOP 1.8L   Objective:  Vital signs in last 24 hours:  Temp:  [97.5 F (36.4 C)-98.2 F (36.8 C)] 98.2 F (36.8 C) (02/25 0745) Pulse Rate:  [56-99] 59 (02/25 0745) Resp:  [18] 18 (02/25 0745) BP: (90-117)/(62-84) 117/84 (02/25 0745) SpO2:  [95 %-100 %] 100 % (02/25 0745) Weight:  [99.7 kg] 99.7 kg (02/25 0441)  Weight change: 0.5 kg Filed Weights   05/30/22 0404 05/31/22 0533 06/01/22 0441  Weight: 99.3 kg 99.2 kg 99.7 kg    Intake/Output: I/O last 3 completed shifts: In: 1440 [P.O.:1440] Out: 2851 [Urine:2850; Stool:1]   Intake/Output this shift:  No intake/output data recorded.  Physical Exam: General: NAD, sitting up in chair   Head: Normocephalic, atraumatic. Moist oral mucosal membranes  Eyes: Anicteric  Lungs:  Clear to auscultation, room air  Heart: irregular  Abdomen:  Soft, nontender  Extremities:  trace peripheral edema.  Neurologic: Alert and oriented, moving all four extremities  Skin: No lesions  Access: None    Basic Metabolic Panel: Recent Labs  Lab 05/29/22 0618 05/29/22 0957 05/29/22 1346 05/30/22 0630 05/31/22 0436 06/01/22 0500  NA 130* 131* 131* 132* 132* 133*  K 4.1 4.3 4.3 4.0 4.3 4.2  CL 99 98 97* 103 105 105  CO2 20* 21* 23 20* 19* 21*  GLUCOSE 98 105* 98 106* 112* 90  BUN 85* 85* 84* 74* 67* 57*  CREATININE 3.71* 3.75* 3.55* 2.79* 2.25* 1.88*  CALCIUM 8.1* 8.4* 8.2* 8.2* 8.5* 8.6*  MG 1.8  --   --  1.9 1.8 1.6*  PHOS  --   --   --  3.0  --   --      Liver Function Tests: Recent Labs  Lab 05/28/22 1009 05/29/22 0957  AST 128* 73*  ALT 56* 50*  ALKPHOS 44 46  BILITOT 0.8 0.9  PROT 6.7 6.3*  ALBUMIN 2.9* 2.6*    No results for input(s): "LIPASE", "AMYLASE" in the last 168 hours. No  results for input(s): "AMMONIA" in the last 168 hours.  CBC: Recent Labs  Lab 05/28/22 1009 05/29/22 0618 05/30/22 0630 05/31/22 0436 06/01/22 0500  WBC 10.2 10.3 10.2 9.4 8.8  NEUTROABS 8.5*  --   --   --   --   HGB 11.7* 10.3* 10.5* 10.8* 10.2*  HCT 34.6* 30.1* 31.5* 32.3* 30.6*  MCV 93.8 92.6 94.3 94.4 93.9  PLT 259 220 217 230 255     Cardiac Enzymes: No results for input(s): "CKTOTAL", "CKMB", "CKMBINDEX", "TROPONINI" in the last 168 hours.  BNP: Invalid input(s): "POCBNP"  CBG: No results for input(s): "GLUCAP" in the last 168 hours.  Microbiology: Results for orders placed or performed during the hospital encounter of 05/28/22  Resp panel by RT-PCR (RSV, Flu A&B, Covid)     Status: None   Collection Time: 05/29/22  9:54 AM  Result Value Ref Range Status   SARS Coronavirus 2 by RT PCR NEGATIVE NEGATIVE Final    Comment: (NOTE) SARS-CoV-2 target nucleic acids are NOT DETECTED.  The SARS-CoV-2 RNA is generally detectable in upper respiratory specimens during the acute phase of infection. The lowest concentration of SARS-CoV-2 viral copies this assay can detect is 138 copies/mL. A negative result does not preclude SARS-Cov-2 infection and should not be used  as the sole basis for treatment or other patient management decisions. A negative result may occur with  improper specimen collection/handling, submission of specimen other than nasopharyngeal swab, presence of viral mutation(s) within the areas targeted by this assay, and inadequate number of viral copies(<138 copies/mL). A negative result must be combined with clinical observations, patient history, and epidemiological information. The expected result is Negative.  Fact Sheet for Patients:  EntrepreneurPulse.com.au  Fact Sheet for Healthcare Providers:  IncredibleEmployment.be  This test is no t yet approved or cleared by the [place] FDA and  has been authorized  for detection and/or diagnosis of SARS-CoV-2 by FDA under an Emergency Use Authorization (EUA). This EUA will remain  in effect (meaning this test can be used) for the duration of the COVID-19 declaration under Section 564(b)(1) of the Act, 21 U.S.C.section 360bbb-3(b)(1), unless the authorization is terminated  or revoked sooner.       Influenza A by PCR NEGATIVE NEGATIVE Final   Influenza B by PCR NEGATIVE NEGATIVE Final    Comment: (NOTE) The Xpert Xpress SARS-CoV-2/FLU/RSV plus assay is intended as an aid in the diagnosis of influenza from Nasopharyngeal swab specimens and should not be used as a sole basis for treatment. Nasal washings and aspirates are unacceptable for Xpert Xpress SARS-CoV-2/FLU/RSV testing.  Fact Sheet for Patients: EntrepreneurPulse.com.au  Fact Sheet for Healthcare Providers: IncredibleEmployment.be  This test is not yet approved or cleared by the [place] FDA and has been authorized for detection and/or diagnosis of SARS-CoV-2 by FDA under an Emergency Use Authorization (EUA). This EUA will remain in effect (meaning this test can be used) for the duration of the COVID-19 declaration under Section 564(b)(1) of the Act, 21 U.S.C. section 360bbb-3(b)(1), unless the authorization is terminated or revoked.     Resp Syncytial Virus by PCR NEGATIVE NEGATIVE Final    Comment: (NOTE) Fact Sheet for Patients: EntrepreneurPulse.com.au  Fact Sheet for Healthcare Providers: IncredibleEmployment.be  This test is not yet approved or cleared by the [place] FDA and has been authorized for detection and/or diagnosis of SARS-CoV-2 by FDA under an Emergency Use Authorization (EUA). This EUA will remain in effect (meaning this test can be used) for the duration of the COVID-19 declaration under Section 564(b)(1) of the Act, 21 U.S.C. section 360bbb-3(b)(1), unless the authorization is  terminated or revoked.  Performed at [facility], [place], [place] [address]   Respiratory (~20 pathogens) panel by PCR     Status: None   Collection Time: 05/29/22  5:16 PM   Specimen: Nasopharyngeal Swab; Respiratory  Result Value Ref Range Status   Adenovirus NOT DETECTED NOT DETECTED Final   Coronavirus 229E NOT DETECTED NOT DETECTED Final    Comment: (NOTE) The Coronavirus on the Respiratory Panel, DOES NOT test for the novel  Coronavirus (2019 nCoV)    Coronavirus HKU1 NOT DETECTED NOT DETECTED Final   Coronavirus NL63 NOT DETECTED NOT DETECTED Final   Coronavirus OC43 NOT DETECTED NOT DETECTED Final   Metapneumovirus NOT DETECTED NOT DETECTED Final   Rhinovirus / Enterovirus NOT DETECTED NOT DETECTED Final   Influenza A NOT DETECTED NOT DETECTED Final   Influenza B NOT DETECTED NOT DETECTED Final   Parainfluenza Virus 1 NOT DETECTED NOT DETECTED Final   Parainfluenza Virus 2 NOT DETECTED NOT DETECTED Final   Parainfluenza Virus 3 NOT DETECTED NOT DETECTED Final   Parainfluenza Virus 4 NOT DETECTED NOT DETECTED Final   Respiratory Syncytial Virus NOT DETECTED NOT DETECTED Final  Bordetella pertussis NOT DETECTED NOT DETECTED Final   Bordetella Parapertussis NOT DETECTED NOT DETECTED Final   Chlamydophila pneumoniae NOT DETECTED NOT DETECTED Final   Mycoplasma pneumoniae NOT DETECTED NOT DETECTED Final    Comment: Performed at [facility], [place] [address], [place], [place] [address]  C Difficile Quick Screen w PCR reflex     Status: None   Collection Time: 05/30/22 12:05 AM   Specimen: STOOL  Result Value Ref Range Status   C Diff antigen NEGATIVE NEGATIVE Final   C Diff toxin NEGATIVE NEGATIVE Final   C Diff interpretation No C. difficile detected.  Final    Comment: Performed at [facility], [address] [place], [place] [address]  Gastrointestinal Panel by PCR , Stool     Status: None   Collection Time: 05/30/22 12:05  AM   Specimen: Stool  Result Value Ref Range Status   Campylobacter species NOT DETECTED NOT DETECTED Final   Plesimonas shigelloides NOT DETECTED NOT DETECTED Final   Salmonella species NOT DETECTED NOT DETECTED Final   Yersinia enterocolitica NOT DETECTED NOT DETECTED Final   Vibrio species NOT DETECTED NOT DETECTED Final   Vibrio cholerae NOT DETECTED NOT DETECTED Final   Enteroaggregative E coli (EAEC) NOT DETECTED NOT DETECTED Final   Enteropathogenic E coli (EPEC) NOT DETECTED NOT DETECTED Final   Enterotoxigenic E coli (ETEC) NOT DETECTED NOT DETECTED Final   Shiga like toxin producing E coli (STEC) NOT DETECTED NOT DETECTED Final   Shigella/Enteroinvasive E coli (EIEC) NOT DETECTED NOT DETECTED Final   Cryptosporidium NOT DETECTED NOT DETECTED Final   Cyclospora cayetanensis NOT DETECTED NOT DETECTED Final   Entamoeba histolytica NOT DETECTED NOT DETECTED Final   Giardia lamblia NOT DETECTED NOT DETECTED Final   Adenovirus F40/41 NOT DETECTED NOT DETECTED Final   Astrovirus NOT DETECTED NOT DETECTED Final   Norovirus GI/GII NOT DETECTED NOT DETECTED Final   Rotavirus A NOT DETECTED NOT DETECTED Final   Sapovirus (I, II, IV, and V) NOT DETECTED NOT DETECTED Final    Comment: Performed at [facility], [place] [place],  [address]    Coagulation Studies: No results for input(s): "LABPROT", "INR" in the last 72 hours.   Urinalysis: No results for input(s): "COLORURINE", "LABSPEC", "PHURINE", "GLUCOSEU", "HGBUR", "BILIRUBINUR", "KETONESUR", "PROTEINUR", "UROBILINOGEN", "NITRITE", "LEUKOCYTESUR" in the last 72 hours.  Invalid input(s): "APPERANCEUR"     Imaging: No results found.   Medications:    amiodarone 60 mg/hr (06/01/22 1127)   Followed by   amiodarone      acetylcysteine  4 mL Nebulization BID   amiodarone  150 mg Intravenous Once   apixaban  5 mg Oral BID   aspirin EC  81 mg Oral Daily   atorvastatin  80 mg Oral Daily    brimonidine  1 drop Both Eyes Q12H   calcium-vitamin D  1 tablet Oral Daily   doxycycline  100 mg Oral BID   fenofibrate  160 mg Oral Daily   ferrous sulfate  325 mg Oral Daily   fluticasone  2 spray Each Nare Daily   gabapentin  100 mg Oral TID   imipramine  25 mg Oral QHS   latanoprost  1 drop Both Eyes QHS   magnesium oxide  400 mg Oral TID   mometasone-formoterol  2 puff Inhalation BID   multivitamin with minerals  1 tablet Oral Daily   pantoprazole  40 mg Oral Daily   polycarbophil  625 mg Oral  BID   predniSONE  20 mg Oral Daily   sertraline  50 mg Oral QHS   sodium chloride  1 g Oral BID WC   timolol  1 drop Both Eyes BID   dextromethorphan-guaiFENesin, guaiFENesin-dextromethorphan, hydrALAZINE, levalbuterol, nitroGLYCERIN, ondansetron (ZOFRAN) IV, mouth rinse, phenol  Assessment/ Plan:  [name] is a 77 y.o.  male with past medical conditions including hyperlipidemia, COPD, hypertension, anemia, depression and anxiety, obstructive sleep apnea with CPAP, and chronic kidney disease stage IIIa, who was admitted to [facility] on 05/28/2022 for NSTEMI (non-ST elevated myocardial infarction) ([place]) [I21.4] Atrial fibrillation with rapid ventricular response ([place]) [I48.91] Acute renal failure superimposed on chronic kidney disease, unspecified acute renal failure type, unspecified CKD stage ([facility]) [N17.9, N18.9]   Acute kidney injury with proteinuria on chronic kidney disease IIIa. Baseline creatinine 1.8 with GFR 38 on 02/22/23. Acute kidney injury likely secondary to hemodynamic instability with NSAID use. NSTEMI on admission. UA positive for protein.   Renal function has returned to baseline. Adequate UOP noted. Will continue to monitor for now.   Lab Results  Component Value Date   CREATININE 1.88 (H) 06/01/2022   CREATININE 2.25 (H) 05/31/2022   CREATININE 2.79 (H) 05/30/2022    Intake/Output Summary (Last 24 hours) at 06/01/2022 1130 Last data filed at 06/01/2022  0443 Gross per 24 hour  Intake 480 ml  Output 1851 ml  Net -1371 ml    2. Anemia of chronic kidney disease Lab Results  Component Value Date   HGB 10.2 (L) 06/01/2022    Hemoglobin remains stable  3. Hypertension with chronic kidney disease. Home regimen includes metoprolol which is helping with rate control.   Blood pressure remains stable for this patient.    LOS: 4 [place] 2/25/202411:30 AM

## 2022-06-01 NOTE — Evaluation (Signed)
Physical Therapy Evaluation Patient Details Name: Gavin Maxwell MRN: [id number] DOB: October 23, 1945 Today's Date: 06/01/2022  History of Present Illness  Pt is a 77 y/o M admitted on 05/28/22 after presenting with c/o SOB & diarrhea. Pt is being treated for dyspnea & cough, likely viral illness, as well as NSTEMI. PMH: HTN, COPD, CKD3A, anemia, OSA on CPAP, NHL in remission, SVT  Clinical Impression  Pt seen for PT evaluation with pt agreeable to tx. Pt reports prior to admission he was mod I with SPC. On this date, pt requires min assist with HOB fully elevated & use of bed rails to come to sitting EOB. Pt is able to transfer STS & stand pivot with RW & CGA. Overall mobility limited by elevated HR with movement (up to 176 bpm with standing EOB & pt endorsing SOB - MD made aware). Will continue to follow pt acutely to progress mobility as able.       Recommendations for follow up therapy are one component of a multi-disciplinary discharge planning process, led by the attending physician.  Recommendations may be updated based on patient status, additional functional criteria and insurance authorization.  Follow Up Recommendations Home health PT      Assistance Recommended at Discharge Intermittent Supervision/Assistance  Patient can return home with the following  A little help with walking and/or transfers;A little help with bathing/dressing/bathroom;Assistance with cooking/housework;Assist for transportation;Help with stairs or ramp for entrance    Equipment Recommendations None recommended by PT (pt reports he already has a RW)  Recommendations for Other Services  OT consult    Functional Status Assessment Patient has had a recent decline in their functional status and demonstrates the ability to make significant improvements in function in a reasonable and predictable amount of time.     Precautions / Restrictions Precautions Precautions: Fall Precaution Comments: watch  HR Restrictions Weight Bearing Restrictions: No      Mobility  Bed Mobility Overal bed mobility: Needs Assistance Bed Mobility: Supine to Sit     Supine to sit: Min assist, HOB elevated     General bed mobility comments: Pt is able to come to sitting EOB with min assist but HOB fully elevated & cuing for technique, extra time.    Transfers Overall transfer level: Needs assistance Equipment used: Rolling walker (2 wheels) Transfers: Sit to/from Stand, Bed to chair/wheelchair/BSC Sit to Stand: Min guard   Step pivot transfers: Min guard       General transfer comment: STS from EOB    Ambulation/Gait                  Stairs            Wheelchair Mobility    Modified Rankin (Stroke Patients Only)       Balance Overall balance assessment: Needs assistance Sitting-balance support: Feet supported Sitting balance-Leahy Scale: Good Sitting balance - Comments: supervision static sitting   Standing balance support: Bilateral upper extremity supported, During functional activity, Reliant on assistive device for balance Standing balance-Leahy Scale: Fair                               Pertinent Vitals/Pain Pain Assessment Pain Assessment: No/denies pain    Home Living Family/patient expects to be discharged to:: Private residence Living Arrangements: Spouse/significant other (daughter) Available Help at Discharge: Family Type of Home: House Home Access: Stairs to enter Entrance Stairs-Rails: None Entrance Stairs-Number of Steps: 2  Home Layout: One level Home Equipment: Conservation officer, nature (2 wheels);Cane - single point      Prior Function Prior Level of Function : Independent/Modified Independent             Mobility Comments: Pt reports he was ambulatory with SPC at baseline, pt reports last fall was ~1 year ago.       Hand Dominance        Extremity/Trunk Assessment   Upper Extremity Assessment Upper Extremity  Assessment: Generalized weakness (Pt wears brace on LUE, reports he's worn this for years 2/2 CA diagnoses)    Lower Extremity Assessment Lower Extremity Assessment: Generalized weakness       Communication   Communication: No difficulties  Cognition Arousal/Alertness: Awake/alert Behavior During Therapy: WFL for tasks assessed/performed Overall Cognitive Status: Within Functional Limits for tasks assessed                                          General Comments General comments (skin integrity, edema, etc.): Pt on room air throughout session, endorses dizziness upon sitting EOB that dissipates within ~2-3 minutes (pt endorses not being OOB in days). Pt with c/o SOB after standing EOB with HR noted to be 176 bpm, does decrease to 126 bpm before transferring to recliner. MD notified of elevated HR. Pt with morning meds sitting on tray -- nurse & MD made aware.    Exercises     Assessment/Plan    PT Assessment Patient needs continued PT services  PT Problem List Decreased strength;Cardiopulmonary status limiting activity;Decreased activity tolerance;Decreased balance;Decreased mobility;Decreased safety awareness;Decreased knowledge of use of DME       PT Treatment Interventions DME instruction;Therapeutic exercise;Gait training;Balance training;Neuromuscular re-education;Stair training;Functional mobility training;Patient/family education;Therapeutic activities    PT Goals (Current goals can be found in the Care Plan section)  Acute Rehab PT Goals Patient Stated Goal: get better/stronger PT Goal Formulation: With patient Time For Goal Achievement: 06/15/22 Potential to Achieve Goals: Good    Frequency Min 2X/week     Co-evaluation               AM-PAC PT "6 Clicks" Mobility  Outcome Measure Help needed turning from your back to your side while in a flat bed without using bedrails?: A Little Help needed moving from lying on your back to sitting on  the side of a flat bed without using bedrails?: A Lot Help needed moving to and from a bed to a chair (including a wheelchair)?: A Little Help needed standing up from a chair using your arms (e.g., wheelchair or bedside chair)?: A Little Help needed to walk in hospital room?: A Little Help needed climbing 3-5 steps with a railing? : A Lot 6 Click Score: 16    End of Session   Activity Tolerance: Treatment limited secondary to medical complications (Comment) Patient left: in chair;with call bell/phone within reach Nurse Communication: Mobility status (HR) PT Visit Diagnosis: Muscle weakness (generalized) (M62.81);Difficulty in walking, not elsewhere classified (R26.2)    Time: [id number] PT Time Calculation (min) (ACUTE ONLY): 18 min   Charges:   PT Evaluation $PT Eval High Complexity: 1 High          Lavone Nian, PT, DPT 06/01/22, 10:20 AM   Waunita Schooner 06/01/2022, 10:19 AM

## 2022-06-02 ENCOUNTER — Other Ambulatory Visit: Payer: Self-pay

## 2022-06-02 DIAGNOSIS — R0602 Shortness of breath: Secondary | ICD-10-CM

## 2022-06-02 DIAGNOSIS — I214 Non-ST elevation (NSTEMI) myocardial infarction: Secondary | ICD-10-CM | POA: Diagnosis not present

## 2022-06-02 DIAGNOSIS — I4891 Unspecified atrial fibrillation: Secondary | ICD-10-CM

## 2022-06-02 LAB — BASIC METABOLIC PANEL
Anion gap: 9 (ref 5–15)
BUN: 56 mg/dL — ABNORMAL HIGH (ref 8–23)
CO2: 21 mmol/L — ABNORMAL LOW (ref 22–32)
Calcium: 8.7 mg/dL — ABNORMAL LOW (ref 8.9–10.3)
Chloride: 104 mmol/L (ref 98–111)
Creatinine, Ser: 1.77 mg/dL — ABNORMAL HIGH (ref 0.61–1.24)
GFR, Estimated: 39 mL/min — ABNORMAL LOW (ref >60)
Glucose, Bld: 139 mg/dL — ABNORMAL HIGH (ref 70–99)
Potassium: 4.8 mmol/L (ref 3.5–5.1)
Sodium: 134 mmol/L — ABNORMAL LOW (ref 135–145)

## 2022-06-02 LAB — PROTIME-INR
INR: 1.5 — ABNORMAL HIGH (ref 0.8–1.2)
Prothrombin Time: 18.2 seconds — ABNORMAL HIGH (ref 11.4–15.2)

## 2022-06-02 LAB — PARATHYROID HORMONE, INTACT (NO CA): PTH: 21 pg/mL (ref 15–65)

## 2022-06-02 MED ORDER — ACETAMINOPHEN 325 MG PO TABS
650.0000 mg | ORAL_TABLET | Freq: Once | ORAL | Status: AC
  Administered 2022-06-02: 650 mg via ORAL
  Filled 2022-06-02: qty 2

## 2022-06-02 MED ORDER — SODIUM CHLORIDE 0.9 % IV SOLN: INTRAVENOUS | Status: DC

## 2022-06-02 MED ORDER — PREDNISONE 20 MG PO TABS
20.0000 mg | ORAL_TABLET | Freq: Every day | ORAL | Status: AC
  Administered 2022-06-03: 20 mg via ORAL
  Filled 2022-06-02: qty 1

## 2022-06-02 MED ORDER — METOPROLOL TARTRATE 25 MG PO TABS
25.0000 mg | ORAL_TABLET | Freq: Two times a day (BID) | ORAL | Status: DC
  Administered 2022-06-02 – 2022-06-04 (×4): 25 mg via ORAL
  Filled 2022-06-02 (×4): qty 1

## 2022-06-02 NOTE — Progress Notes (Signed)
Progress Note  Patient Name: Gavin Maxwell Date of Encounter: 06/02/2022  Primary Cardiologist: Rockey Situ  Subjective   Remains in Afib/flutter with RVR with ventricular rates in the 120s to 130s bpm. He did trend into the 170s bpm with PT. Was missing some doses of metoprolol secondary to soft BP. Currently on amiodarone gtt. BP stable in the low 100s to 0000000 mmHg systolic. Breathing improved, close to baseline. No chest pain or palpitations.   Inpatient Medications    Scheduled Meds:  apixaban  5 mg Oral BID   aspirin EC  81 mg Oral Daily   atorvastatin  80 mg Oral Daily   brimonidine  1 drop Both Eyes Q12H   calcium-vitamin D  1 tablet Oral Daily   fenofibrate  160 mg Oral Daily   ferrous sulfate  325 mg Oral Daily   fluticasone  2 spray Each Nare Daily   gabapentin  100 mg Oral TID   imipramine  25 mg Oral QHS   latanoprost  1 drop Both Eyes QHS   magnesium oxide  400 mg Oral TID   mometasone-formoterol  2 puff Inhalation BID   multivitamin with minerals  1 tablet Oral Daily   pantoprazole  40 mg Oral Daily   polycarbophil  625 mg Oral BID   polyethylene glycol  17 g Oral BID   predniSONE  20 mg Oral Daily   sertraline  50 mg Oral QHS   sodium chloride  1 g Oral BID WC   timolol  1 drop Both Eyes BID   Continuous Infusions:  amiodarone 30 mg/hr (06/02/22 0901)   PRN Meds: dextromethorphan-guaiFENesin, guaiFENesin-dextromethorphan, hydrALAZINE, levalbuterol, nitroGLYCERIN, ondansetron (ZOFRAN) IV, mouth rinse, phenol   Vital Signs    Vitals:   06/01/22 2315 06/02/22 0335 06/02/22 0500 06/02/22 0857  BP: 105/76 113/77  104/72  Pulse: 99 (!) 105  (!) 143  Resp: '17 16  18  '$ Temp: (!) 97.5 F (36.4 C) 97.7 F (36.5 C)  97.7 F (36.5 C)  TempSrc:  Oral  Oral  SpO2: 95% 96%  99%  Weight:   100.2 kg   Height:        Intake/Output Summary (Last 24 hours) at 06/02/2022 0902 Last data filed at 06/02/2022 0901 Gross per 24 hour  Intake 1449.61 ml  Output 2150 ml   Net -700.39 ml   Filed Weights   05/31/22 0533 06/01/22 0441 06/02/22 0500  Weight: 99.2 kg 99.7 kg 100.2 kg    Telemetry    Afib/flutter with RVR with ventricular rates in the 120s to 130s bpm - Personally Reviewed  ECG    Afib with RVR, 104 bpm, poor R wave progression along the precordial leads - Personally Reviewed  Physical Exam   GEN: No acute distress.   Neck: No JVD. Cardiac: Tachycardic, IRIR, no murmurs, rubs, or gallops.  Respiratory: Diminished breath sounds along the bases bilaterally.  GI: Soft, nontender, non-distended.   MS: No edema; No deformity. Neuro:  Alert and oriented x 3; Nonfocal.  Psych: Normal affect.  Labs    Chemistry Recent Labs  Lab 05/28/22 1009 05/28/22 1207 05/29/22 0957 05/29/22 1346 05/30/22 0630 05/31/22 0436 06/01/22 0500  NA 127*   < > 131*   < > 132* 132* 133*  K 3.9   < > 4.3   < > 4.0 4.3 4.2  CL 96*   < > 98   < > 103 105 105  CO2 19*   < > 21*   < >  20* 19* 21*  GLUCOSE 121*   < > 105*   < > 106* 112* 90  BUN 82*   < > 85*   < > 74* 67* 57*  CREATININE 4.20*   < > 3.75*   < > 2.79* 2.25* 1.88*  CALCIUM 8.5*   < > 8.4*   < > 8.2* 8.5* 8.6*  PROT 6.7  --  6.3*  --   --   --   --   ALBUMIN 2.9*  --  2.6*  --   --   --   --   AST 128*  --  73*  --   --   --   --   ALT 56*  --  50*  --   --   --   --   ALKPHOS 44  --  46  --   --   --   --   BILITOT 0.8  --  0.9  --   --   --   --   GFRNONAA 14*   < > 16*   < > 22* 29* 36*  ANIONGAP 12   < > 12   < > '9 8 7   '$ < > = values in this interval not displayed.     Hematology Recent Labs  Lab 05/30/22 0630 05/31/22 0436 06/01/22 0500  WBC 10.2 9.4 8.8  RBC 3.34* 3.42* 3.26*  HGB 10.5* 10.8* 10.2*  HCT 31.5* 32.3* 30.6*  MCV 94.3 94.4 93.9  MCH 31.4 31.6 31.3  MCHC 33.3 33.4 33.3  RDW 13.4 13.6 13.4  PLT 217 230 255    Cardiac EnzymesNo results for input(s): "TROPONINI" in the last 168 hours. No results for input(s): "TROPIPOC" in the last 168 hours.    BNP Recent Labs  Lab 05/28/22 1009  BNP 215.1*     DDimer No results for input(s): "DDIMER" in the last 168 hours.   Radiology    No results found.  Cardiac Studies   2D echo 05/29/2022: 1. Left ventricular ejection fraction, by estimation, is 60 to 65%. The  left ventricle has normal function. The left ventricle has no regional  wall motion abnormalities. There is moderate concentric left ventricular  hypertrophy. Left ventricular  diastolic parameters are indeterminate.   2. Right ventricular systolic function is normal. The right ventricular  size is normal.   3. The mitral valve is normal in structure. Mild mitral valve  regurgitation. No evidence of mitral stenosis.   4. The aortic valve is normal in structure. Aortic valve regurgitation is  not visualized. Aortic valve sclerosis/calcification is present, without  any evidence of aortic stenosis.   5. The inferior vena cava is normal in size with greater than 50%  respiratory variability, suggesting right atrial pressure of 3 mmHg.   Patient Profile     77 y.o. 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  Assessment & Plan    1. Acute respiratory dystress: -Viral respiratory panel negative -Empiric ABX per IM -Agree with Xoepnex in place of albuterol given Afib/flutter -Improved  2. Afib/flutter with RVR: -Noted on presentation, rates have  been difficult to control secondary to soft BP -Remains in Afib/flutter with RVR with ventricular rates in the 120s to 130s bpm -Relative  hypotension led to the holding and ultimate discontinuation of metoprolol over the weekend -Currently on amiodarone gtt with difficult to control ventricular rates  -Schedule TEE-guided DCCV for 2/27 -NPO at midnight  -CHADS2VASc at least 4 (HTN, age x 2, vascular disease) -Eliquis 5 mg bid, does not meet reduced dosing criteria at this time  3. NSTEMI: -Cannot exclude supply demand ischemia in the setting of Afib/flutter with RVR, acute hypoxic respiratory distress, and CKD -No chest pain -Not currently a good cath candidate given renal dysfunction  -Completed 48 hours of heparin  -MPI not ideal with body habitus -If renal function remains close to baseline, could consider cardiac cath down the road -ASA -Lipitor  4. Acute on CKD stage III: -Baseline SCr 1.8, presenting SCr of 4.2 -Felt to represent ATN in the setting of the above -Improving, now back to approximate baseline  5. Normocytic anemia: -Stable  6. Hyponatremia: -Improving  -Does not appear to be hypervolemic     Shared Decision Making/Informed Consent{  The risks [stroke, cardiac arrhythmias rarely resulting in the need for a temporary or permanent pacemaker, skin irritation or burns, esophageal damage, perforation (1:10,000 risk), bleeding, pharyngeal hematoma as well as other potential complications associated with conscious sedation including aspiration, arrhythmia, respiratory failure and death], benefits (treatment guidance, restoration of normal sinus rhythm, diagnostic support) and alternatives of a transesophageal echocardiogram guided cardioversion were discussed in detail with Mr. Wiemken and he is willing to proceed.   For questions or updates, please contact [place] Please consult [email or web address] for contact info under Cardiology/STEMI.    Signed, Christell Faith, PA-C [place] Pager: [phone] 06/02/2022, 9:02 AM

## 2022-06-02 NOTE — Progress Notes (Signed)
[facility] NAME: Gavin Maxwell    MR#:  [id number]  DATE OF BIRTH:  06-May-1945  SUBJECTIVE:   NO family at bedside. Hr 110-120's Denies any chest pain. Did have shortness of breath with elevated heart rate when earlier tried to work with therapy. No cough or fever.   VITALS:  Blood pressure 100/85, pulse (!) 129, temperature 98.5 F (36.9 C), temperature source Oral, resp. rate 18, height '5\' 11"'$  (1.803 m), weight 100.2 kg, SpO2 100 %.  PHYSICAL EXAMINATION:   GENERAL:  77 y.o.-year-old patient with no acute distress. Morbid obesity LUNGS: Normal breath sounds bilaterally, no wheezing CARDIOVASCULAR: S1, S2 normal. No murmur tachycardia ABDOMEN: Soft, nontender, nondistended. Bowel sounds present.  EXTREMITIES: +  edema b/l.    NEUROLOGIC: nonfocal  patient is alert and awake SKIN: No obvious rash, lesion, or ulcer.   LABORATORY PANEL:  CBC Recent Labs  Lab 06/01/22 0500  WBC 8.8  HGB 10.2*  HCT 30.6*  PLT 255     Chemistries  Recent Labs  Lab 05/29/22 0957 05/29/22 1346 06/01/22 0500 06/02/22 0900  NA 131*   < > 133* 134*  K 4.3   < > 4.2 4.8  CL 98   < > 105 104  CO2 21*   < > 21* 21*  GLUCOSE 105*   < > 90 139*  BUN 85*   < > 57* 56*  CREATININE 3.75*   < > 1.88* 1.77*  CALCIUM 8.4*   < > 8.6* 8.7*  MG  --    < > 1.6*  --   AST 73*  --   --   --   ALT 50*  --   --   --   ALKPHOS 46  --   --   --   BILITOT 0.9  --   --   --    < > = values in this interval not displayed.    Assessment and Plan  Gavin Maxwell is a 77 y.o. male with medical history significant of HTN, COPD not on oxygen at baseline, CKD 3A, anemia, OSA on CPAP, NHL in remission, SVT, who presented with shortness breath and diarrhea.   Patient states that has SOB for about 5 day, which has been progressively worsening.  Patient was seen by PCP on 2/19, and diagnosed with COPD exacerbation.  Patient given prescription for doxycycline  and prednisone, without significant improvement.  Atrial fibrillation with RVR ([place]): New onset --new onset.  CHADS2 score is 5 (hypertension, age 76, CHF, CAD).  --transition from heparin gtt to Eliquis --IV Amiodarone gtt per cardiology. Hold BB due to low BP --Plans for TEE/CV noted for tomorrow   Dyspnea and cough Acute respiratory failure ruled out --likely viral illness.  Resp panel neg, RVP neg. --Mucomyst neb BID and xopenex BID (due o tachy)   NSTEMI (non-ST elevated myocardial infarction) ([place]):   --In the setting of respiratory failure, viral syndrome, atrial fibrillation with RVR, renal failure --Troponin 2100, trending downward Normal LV function with no regional wall motion abnormality on echo --started on heparin gtt.  Cardiology consulted.  Not a good candidate for cardiac catheterization given renal failure. Change to po eliquis. Large body habitus would make Myoview less appealing. --Ischemic workup deferred for now.   -If renal function improves back to baseline, could consider cardiac catheterization at a later date    Chronic diastolic congestive heart failure ([place]) Ruled out acute  exacerbation   COPD with acute exacerbation [facility]):  Currently patient does not have wheezing. -continue outpatient prescribed prednisone and doxycycline -Bronchodilators    Essential hypertension --BP varied, sometimes hypotensive  control   Hyperlipidemia -Lipitor and fenofibrate   Acute renal failure superimposed on stage 3b chronic kidney disease ([place]):  --Baseline creatinine 1.8, presenting creatinine 4.2.  Likely due to dehydration secondary to diarrhea, and hemodynamic instability with NSAID use.  -Patient received 1 L normal saline bolus in the ED  --  Cr improved to 1.7 today. --nephro consult appreciated. -hold Mobic   Diarrhea - C. difficile and GI pathogen panel both neg   Abnormal LFTs, mild --unclear etiology -acute hepatitis panel neg   Hyponatremia:   Sodium 127, mental status normal.  Likely due to diarrhea --Na improved    Depression with anxiety --cont imipramine and sertraline   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[place]) -In remission -Follow-up with hematology   Obesity with body mass index of 30.0-39.9: Body weight 106 kg, BMI 32.59 -Encourage to lose weight -Exercise and healthy diet     DVT prophylaxis: [id number] Code Status: Full code  Family Communication: none today Level of care: Progressive Dispo:   The patient is from: home Anticipated d/c is to: likely home with [place] in 1-3 days once Afib under control Consults :[facility] cardiology    TOTAL TIME TAKING CARE OF THIS PATIENT: 35 minutes.  >50% time spent on counselling and coordination of care  Note: This dictation was prepared with Dragon dictation along with smaller phrase technology. Any transcriptional errors that result from this process are unintentional.  Fritzi Mandes M.D    [facility]   CC: Primary care physician; Idelle Crouch, MD

## 2022-06-02 NOTE — Care Management Important Message (Signed)
Important Message  Patient Details  Name: Gavin Maxwell MRN: [id number] Date of Birth: [birth year]   Medicare Important Message Given:  Yes     [name] 06/02/2022, 1:55 PM

## 2022-06-02 NOTE — Progress Notes (Signed)
[facility]  ROUNDING NOTE   Subjective:   Patient seen sitting in chair Wife at bedside Appears in good spirits today. Denies shortness of breath, nausea or vomting  Creatinine 1.77 (1.88) (2.25) (2.79) UOP 1.65L   Objective:  Vital signs in last 24 hours:  Temp:  [97.5 F (36.4 C)-98.5 F (36.9 C)] 98.5 F (36.9 C) (02/26 1114) Pulse Rate:  [63-143] 129 (02/26 1114) Resp:  [16-20] 18 (02/26 1114) BP: (100-115)/(53-85) 100/85 (02/26 1114) SpO2:  [91 %-100 %] 100 % (02/26 1114) Weight:  [100.2 kg] 100.2 kg (02/26 0500)  Weight change: 0.5 kg Filed Weights   05/31/22 0533 06/01/22 0441 06/02/22 0500  Weight: 99.2 kg 99.7 kg 100.2 kg    Intake/Output: I/O last 3 completed shifts: In: 1251.7 [P.O.:1040; I.V.:211.7] Out: 2850 [Urine:2850]   Intake/Output this shift:  Total I/O In: 1037.9 [P.O.:720; I.V.:317.9] Out: 900 [Urine:900]  Physical Exam: General: NAD, sitting up in chair   Head: Normocephalic, atraumatic. Moist oral mucosal membranes  Eyes: Anicteric  Lungs:  Clear to auscultation, room air  Heart: irregular  Abdomen:  Soft, nontender  Extremities:  trace peripheral edema.  Neurologic: Alert and oriented, moving all four extremities  Skin: No lesions  Access: None    Basic Metabolic Panel: Recent Labs  Lab 05/29/22 0618 05/29/22 0957 05/29/22 1346 05/30/22 0630 05/31/22 0436 06/01/22 0500 06/02/22 0900  NA 130*   < > 131* 132* 132* 133* 134*  K 4.1   < > 4.3 4.0 4.3 4.2 4.8  CL 99   < > 97* 103 105 105 104  CO2 20*   < > 23 20* 19* 21* 21*  GLUCOSE 98   < > 98 106* 112* 90 139*  BUN 85*   < > 84* 74* 67* 57* 56*  CREATININE 3.71*   < > 3.55* 2.79* 2.25* 1.88* 1.77*  CALCIUM 8.1*   < > 8.2* 8.2* 8.5* 8.6* 8.7*  MG 1.8  --   --  1.9 1.8 1.6*  --   PHOS  --   --   --  3.0  --   --   --    < > = values in this interval not displayed.     Liver Function Tests: Recent Labs  Lab 05/28/22 1009 05/29/22 0957  AST 128* 73*   ALT 56* 50*  ALKPHOS 44 46  BILITOT 0.8 0.9  PROT 6.7 6.3*  ALBUMIN 2.9* 2.6*    No results for input(s): "LIPASE", "AMYLASE" in the last 168 hours. No results for input(s): "AMMONIA" in the last 168 hours.  CBC: Recent Labs  Lab 05/28/22 1009 05/29/22 0618 05/30/22 0630 05/31/22 0436 06/01/22 0500  WBC 10.2 10.3 10.2 9.4 8.8  NEUTROABS 8.5*  --   --   --   --   HGB 11.7* 10.3* 10.5* 10.8* 10.2*  HCT 34.6* 30.1* 31.5* 32.3* 30.6*  MCV 93.8 92.6 94.3 94.4 93.9  PLT 259 220 217 230 255     Cardiac Enzymes: No results for input(s): "CKTOTAL", "CKMB", "CKMBINDEX", "TROPONINI" in the last 168 hours.  BNP: Invalid input(s): "POCBNP"  CBG: No results for input(s): "GLUCAP" in the last 168 hours.  Microbiology: Results for orders placed or performed during the hospital encounter of 05/28/22  Resp panel by RT-PCR (RSV, Flu A&B, Covid)     Status: None   Collection Time: 05/29/22  9:54 AM  Result Value Ref Range Status   SARS Coronavirus 2 by RT PCR NEGATIVE NEGATIVE Final  Comment: (NOTE) SARS-CoV-2 target nucleic acids are NOT DETECTED.  The SARS-CoV-2 RNA is generally detectable in upper respiratory specimens during the acute phase of infection. The lowest concentration of SARS-CoV-2 viral copies this assay can detect is 138 copies/mL. A negative result does not preclude SARS-Cov-2 infection and should not be used as the sole basis for treatment or other patient management decisions. A negative result may occur with  improper specimen collection/handling, submission of specimen other than nasopharyngeal swab, presence of viral mutation(s) within the areas targeted by this assay, and inadequate number of viral copies(<138 copies/mL). A negative result must be combined with clinical observations, patient history, and epidemiological information. The expected result is Negative.  Fact Sheet for Patients:  EntrepreneurPulse.com.au  Fact Sheet for  Healthcare Providers:  IncredibleEmployment.be  This test is no t yet approved or cleared by the [place] FDA and  has been authorized for detection and/or diagnosis of SARS-CoV-2 by FDA under an Emergency Use Authorization (EUA). This EUA will remain  in effect (meaning this test can be used) for the duration of the COVID-19 declaration under Section 564(b)(1) of the Act, 21 U.S.C.section 360bbb-3(b)(1), unless the authorization is terminated  or revoked sooner.       Influenza A by PCR NEGATIVE NEGATIVE Final   Influenza B by PCR NEGATIVE NEGATIVE Final    Comment: (NOTE) The Xpert Xpress SARS-CoV-2/FLU/RSV plus assay is intended as an aid in the diagnosis of influenza from Nasopharyngeal swab specimens and should not be used as a sole basis for treatment. Nasal washings and aspirates are unacceptable for Xpert Xpress SARS-CoV-2/FLU/RSV testing.  Fact Sheet for Patients: EntrepreneurPulse.com.au  Fact Sheet for Healthcare Providers: IncredibleEmployment.be  This test is not yet approved or cleared by the [place] FDA and has been authorized for detection and/or diagnosis of SARS-CoV-2 by FDA under an Emergency Use Authorization (EUA). This EUA will remain in effect (meaning this test can be used) for the duration of the COVID-19 declaration under Section 564(b)(1) of the Act, 21 U.S.C. section 360bbb-3(b)(1), unless the authorization is terminated or revoked.     Resp Syncytial Virus by PCR NEGATIVE NEGATIVE Final    Comment: (NOTE) Fact Sheet for Patients: EntrepreneurPulse.com.au  Fact Sheet for Healthcare Providers: IncredibleEmployment.be  This test is not yet approved or cleared by the [place] FDA and has been authorized for detection and/or diagnosis of SARS-CoV-2 by FDA under an Emergency Use Authorization (EUA). This EUA will remain in effect (meaning this  test can be used) for the duration of the COVID-19 declaration under Section 564(b)(1) of the Act, 21 U.S.C. section 360bbb-3(b)(1), unless the authorization is terminated or revoked.  Performed at [facility], [place], [place] [address]   Respiratory (~20 pathogens) panel by PCR     Status: None   Collection Time: 05/29/22  5:16 PM   Specimen: Nasopharyngeal Swab; Respiratory  Result Value Ref Range Status   Adenovirus NOT DETECTED NOT DETECTED Final   Coronavirus 229E NOT DETECTED NOT DETECTED Final    Comment: (NOTE) The Coronavirus on the Respiratory Panel, DOES NOT test for the novel  Coronavirus (2019 nCoV)    Coronavirus HKU1 NOT DETECTED NOT DETECTED Final   Coronavirus NL63 NOT DETECTED NOT DETECTED Final   Coronavirus OC43 NOT DETECTED NOT DETECTED Final   Metapneumovirus NOT DETECTED NOT DETECTED Final   Rhinovirus / Enterovirus NOT DETECTED NOT DETECTED Final   Influenza A NOT DETECTED NOT DETECTED Final   Influenza B NOT DETECTED  NOT DETECTED Final   Parainfluenza Virus 1 NOT DETECTED NOT DETECTED Final   Parainfluenza Virus 2 NOT DETECTED NOT DETECTED Final   Parainfluenza Virus 3 NOT DETECTED NOT DETECTED Final   Parainfluenza Virus 4 NOT DETECTED NOT DETECTED Final   Respiratory Syncytial Virus NOT DETECTED NOT DETECTED Final   Bordetella pertussis NOT DETECTED NOT DETECTED Final   Bordetella Parapertussis NOT DETECTED NOT DETECTED Final   Chlamydophila pneumoniae NOT DETECTED NOT DETECTED Final   Mycoplasma pneumoniae NOT DETECTED NOT DETECTED Final    Comment: Performed at [facility], [place] [address] [place], [place] [address]  C Difficile Quick Screen w PCR reflex     Status: None   Collection Time: 05/30/22 12:05 AM   Specimen: STOOL  Result Value Ref Range Status   C Diff antigen NEGATIVE NEGATIVE Final   C Diff toxin NEGATIVE NEGATIVE Final   C Diff interpretation No C. difficile detected.  Final    Comment: Performed  at [facility], [address] [place], [place] [address]  Gastrointestinal Panel by PCR , Stool     Status: None   Collection Time: 05/30/22 12:05 AM   Specimen: Stool  Result Value Ref Range Status   Campylobacter species NOT DETECTED NOT DETECTED Final   Plesimonas shigelloides NOT DETECTED NOT DETECTED Final   Salmonella species NOT DETECTED NOT DETECTED Final   Yersinia enterocolitica NOT DETECTED NOT DETECTED Final   Vibrio species NOT DETECTED NOT DETECTED Final   Vibrio cholerae NOT DETECTED NOT DETECTED Final   Enteroaggregative E coli (EAEC) NOT DETECTED NOT DETECTED Final   Enteropathogenic E coli (EPEC) NOT DETECTED NOT DETECTED Final   Enterotoxigenic E coli (ETEC) NOT DETECTED NOT DETECTED Final   Shiga like toxin producing E coli (STEC) NOT DETECTED NOT DETECTED Final   Shigella/Enteroinvasive E coli (EIEC) NOT DETECTED NOT DETECTED Final   Cryptosporidium NOT DETECTED NOT DETECTED Final   Cyclospora cayetanensis NOT DETECTED NOT DETECTED Final   Entamoeba histolytica NOT DETECTED NOT DETECTED Final   Giardia lamblia NOT DETECTED NOT DETECTED Final   Adenovirus F40/41 NOT DETECTED NOT DETECTED Final   Astrovirus NOT DETECTED NOT DETECTED Final   Norovirus GI/GII NOT DETECTED NOT DETECTED Final   Rotavirus A NOT DETECTED NOT DETECTED Final   Sapovirus (I, II, IV, and V) NOT DETECTED NOT DETECTED Final    Comment: Performed at [facility], [address] [place], [place] [address]    Coagulation Studies: No results for input(s): "LABPROT", "INR" in the last 72 hours.   Urinalysis: No results for input(s): "COLORURINE", "LABSPEC", "PHURINE", "GLUCOSEU", "HGBUR", "BILIRUBINUR", "KETONESUR", "PROTEINUR", "UROBILINOGEN", "NITRITE", "LEUKOCYTESUR" in the last 72 hours.  Invalid input(s): "APPERANCEUR"     Imaging: No results found.   Medications:    amiodarone 30 mg/hr (06/02/22 0901)    apixaban  5 mg Oral BID   aspirin EC  81 mg Oral  Daily   atorvastatin  80 mg Oral Daily   brimonidine  1 drop Both Eyes Q12H   calcium-vitamin D  1 tablet Oral Daily   fenofibrate  160 mg Oral Daily   ferrous sulfate  325 mg Oral Daily   fluticasone  2 spray Each Nare Daily   gabapentin  100 mg Oral TID   imipramine  25 mg Oral QHS   latanoprost  1 drop Both Eyes QHS   magnesium oxide  400 mg Oral TID   mometasone-formoterol  2 puff Inhalation BID   multivitamin  with minerals  1 tablet Oral Daily   pantoprazole  40 mg Oral Daily   polycarbophil  625 mg Oral BID   polyethylene glycol  17 g Oral BID   [START ON 06/03/2022] predniSONE  20 mg Oral Daily   sertraline  50 mg Oral QHS   sodium chloride  1 g Oral BID WC   timolol  1 drop Both Eyes BID   dextromethorphan-guaiFENesin, guaiFENesin-dextromethorphan, hydrALAZINE, levalbuterol, nitroGLYCERIN, ondansetron (ZOFRAN) IV, mouth rinse, phenol  Assessment/ Plan:  [name] is a 77 y.o.  male with past medical conditions including hyperlipidemia, COPD, hypertension, anemia, depression and anxiety, obstructive sleep apnea with CPAP, and chronic kidney disease stage IIIa, who was admitted to [facility] on 05/28/2022 for NSTEMI (non-ST elevated myocardial infarction) ([place]) [I21.4] Atrial fibrillation with rapid ventricular response ([place]) [I48.91] Acute renal failure superimposed on chronic kidney disease, unspecified acute renal failure type, unspecified CKD stage ([facility]) [N17.9, N18.9]   Acute kidney injury with proteinuria on chronic kidney disease IIIa. Baseline creatinine 1.8 with GFR 38 on 02/22/23. Acute kidney injury likely secondary to hemodynamic instability with NSAID use. NSTEMI on admission. UA positive for protein.   Creatinine stable at baseline with decent urine output. Will need outpatient follow up in our office.   Lab Results  Component Value Date   CREATININE 1.77 (H) 06/02/2022   CREATININE 1.88 (H) 06/01/2022   CREATININE 2.25 (H) 05/31/2022    Intake/Output  Summary (Last 24 hours) at 06/02/2022 1115 Last data filed at 06/02/2022 1016 Gross per 24 hour  Intake 1689.61 ml  Output 2550 ml  Net -860.39 ml    2. Anemia of chronic kidney disease Lab Results  Component Value Date   HGB 10.2 (L) 06/01/2022    Hgb within optimal range.   3. Hypertension with chronic kidney disease. Home regimen includes metoprolol which is helping with rate control.   Blood pressure remains stable  Will continue to follow this patient peripherally during upcoming cardiac procedures.    LOS: [place] 2/26/202411:15 AM

## 2022-06-02 NOTE — Evaluation (Signed)
Occupational Therapy Evaluation Patient Details Name: Gavin Maxwell MRN: [id number] DOB: 01/03/1946 Today's Date: 06/02/2022   History of Present Illness Pt is a 77 y/o M admitted on 05/28/22 after presenting with c/o SOB & diarrhea. Pt is being treated for dyspnea & cough, likely viral illness, as well as NSTEMI. PMH: HTN, COPD, CKD3A, anemia, OSA on CPAP, NHL in remission, SVT   Clinical Impression   Patient presenting with decreased Ind in self care,balance, functional mobility/transfers, endurance, and safety awareness. Patient reports being Mod I at baseline with use of SPC at home and living with wife. Pt endorses being Ind in self care and IADLs. Pt's resting HR is 115-120's. Pt endorses need for BM. Pt stands from recliner chair and takes several steps with min guard to sit onto [facility] for BM. HR increased to 140's with transfer. Pt requesting to remain seated on [facility] for toileting needs and asking therapist to step out. RN is agreeable to plan and pt has call bell to call once he is finished and ready to return to chair.Patient will benefit from acute OT to increase overall independence in the areas of ADLs, functional mobility, and safety awareness in order to safely discharge home with family.      Recommendations for follow up therapy are one component of a multi-disciplinary discharge planning process, led by the attending physician.  Recommendations may be updated based on patient status, additional functional criteria and insurance authorization.   Follow Up Recommendations  Home health OT     Assistance Recommended at Discharge Intermittent Supervision/Assistance  Patient can return home with the following A little help with walking and/or transfers;A little help with bathing/dressing/bathroom       Equipment Recommendations  None recommended by OT       Precautions / Restrictions Precautions Precautions: Fall Precaution Comments: watch HR      Mobility Bed Mobility                General bed mobility comments: seated in recliner chair    Transfers Overall transfer level: Needs assistance Equipment used: 1 person hand held assist Transfers: Bed to chair/wheelchair/BSC, Sit to/from Stand Sit to Stand: Supervision     Step pivot transfers: Min guard            Balance Overall balance assessment: Needs assistance Sitting-balance support: Feet supported Sitting balance-Leahy Scale: Good     Standing balance support: During functional activity, Single extremity supported Standing balance-Leahy Scale: Fair                             ADL either performed or assessed with clinical judgement   ADL Overall ADL's : Needs assistance/impaired                         Toilet Transfer: Min guard;BSC/3in1           Functional mobility during ADLs: Min guard       Vision Patient Visual Report: No change from baseline              Pertinent Vitals/Pain Pain Assessment Pain Assessment: No/denies pain     Hand Dominance Right   Extremity/Trunk Assessment Upper Extremity Assessment Upper Extremity Assessment: LUE deficits/detail;Generalized weakness LUE Deficits / Details: hinged brace on L UE and pt reports he has worn this for 10-12 years   Lower Extremity Assessment Lower Extremity Assessment: Generalized weakness  Communication Communication Communication: No difficulties   Cognition Arousal/Alertness: Awake/alert Behavior During Therapy: WFL for tasks assessed/performed Overall Cognitive Status: Within Functional Limits for tasks assessed                                 General Comments: Pt is pleasant and cooperative                Home Living Family/patient expects to be discharged to:: Private residence Living Arrangements: Spouse/significant other Available Help at Discharge: Family;Available PRN/intermittently Type of Home: House Home Access: Stairs to  enter [employer] of Steps: 2 Entrance Stairs-Rails: None Home Layout: One level     Bathroom Shower/Tub: Occupational psychologist: Standard     Home Equipment: Conservation officer, nature (2 wheels);Cane - single point;Shower seat          Prior Functioning/Environment Prior Level of Function : Independent/Modified Independent             Mobility Comments: Pt reports he was ambulatory with SPC at baseline, pt reports last fall was ~1 year ago. ADLs Comments: Pt reports being Ind in self care and IADLs        OT Problem List: Decreased strength;Decreased activity tolerance;Impaired balance (sitting and/or standing);Decreased safety awareness;Decreased knowledge of use of DME or AE      OT Treatment/Interventions: Self-care/ADL training;Therapeutic exercise;Therapeutic activities;Energy conservation;DME and/or AE instruction;Patient/family education;Balance training    OT Goals(Current goals can be found in the care plan section) Acute Rehab OT Goals Patient Stated Goal: to go home OT Goal Formulation: With patient Time For Goal Achievement: 06/16/22 Potential to Achieve Goals: Fair ADL Goals Pt Will Perform Grooming: with modified independence;standing Pt Will Perform Lower Body Dressing: with modified independence;sit to/from stand Pt Will Transfer to Toilet: with modified independence;ambulating Pt Will Perform Toileting - Clothing Manipulation and hygiene: with modified independence;sit to/from stand  OT Frequency: Min 2X/week       AM-PAC OT "6 Clicks" Daily Activity     Outcome Measure Help from another person eating meals?: None Help from another person taking care of personal grooming?: None Help from another person toileting, which includes using toliet, bedpan, or urinal?: A Little Help from another person bathing (including washing, rinsing, drying)?: A Little Help from another person to put on and taking off regular upper body clothing?:  None Help from another person to put on and taking off regular lower body clothing?: A Little 6 Click Score: 21   End of Session Nurse Communication: Mobility status;Other (comment) (pt on [facility])  Activity Tolerance: Patient tolerated treatment well Patient left: Other (comment);with call bell/phone within reach (on [facility])  OT Visit Diagnosis: Muscle weakness (generalized) (M62.81);Unsteadiness on feet (R26.81)                Time: [id number] OT Time Calculation (min): 20 min Charges:  OT General Charges $OT Visit: 1 Visit OT Evaluation $OT Eval Low Complexity: 1 Low OT Treatments $Self Care/Home Management : 8-22 mins  Darleen Crocker, MS, OTR/L , CBIS ascom [phone]  06/02/22, 12:18 PM

## 2022-06-02 NOTE — Progress Notes (Signed)
PT Cancellation Note  Patient Details Name: MATVEY SANTILLANA MRN: [id number] DOB: [birth year]   Cancelled Treatment:     Pt resting in chair, slightly SOB while talking SpO2 95%. HR irregular between 114-128bpm. Pt awaiting TEE guided cardioversion tomorrow. Will re-attempt after procedure.   Josie Dixon 06/02/2022, 1:53 PM

## 2022-06-03 ENCOUNTER — Inpatient Hospital Stay: Payer: PPO | Admitting: Certified Registered Nurse Anesthetist

## 2022-06-03 ENCOUNTER — Other Ambulatory Visit: Payer: Self-pay

## 2022-06-03 ENCOUNTER — Encounter
Admission: EM | Disposition: A | Discharge status: Home Health | Payer: Self-pay | Source: Home / Self Care | Attending: Hospitalist

## 2022-06-03 ENCOUNTER — Inpatient Hospital Stay (HOSPITAL_COMMUNITY)
Admit: 2022-06-03 | Discharge: 2022-06-03 | Disposition: A | Discharge status: Home / Self Care | Payer: PPO | Attending: Physician Assistant | Admitting: Physician Assistant

## 2022-06-03 DIAGNOSIS — I4891 Unspecified atrial fibrillation: Secondary | ICD-10-CM

## 2022-06-03 DIAGNOSIS — I340 Nonrheumatic mitral (valve) insufficiency: Secondary | ICD-10-CM | POA: Diagnosis not present

## 2022-06-03 DIAGNOSIS — I361 Nonrheumatic tricuspid (valve) insufficiency: Secondary | ICD-10-CM | POA: Diagnosis not present

## 2022-06-03 DIAGNOSIS — I214 Non-ST elevation (NSTEMI) myocardial infarction: Secondary | ICD-10-CM | POA: Diagnosis not present

## 2022-06-03 HISTORY — PX: CARDIOVERSION: SHX1299

## 2022-06-03 HISTORY — PX: TEE WITHOUT CARDIOVERSION: SHX5443

## 2022-06-03 SURGERY — ECHOCARDIOGRAM, TRANSESOPHAGEAL
Anesthesia: General

## 2022-06-03 SURGERY — CARDIOVERSION
Anesthesia: General

## 2022-06-03 MED ORDER — PROPOFOL 10 MG/ML IV BOLUS
INTRAVENOUS | Status: AC
  Filled 2022-06-03: qty 20

## 2022-06-03 MED ORDER — MIDAZOLAM HCL 2 MG/2ML IJ SOLN
INTRAMUSCULAR | Status: AC
  Filled 2022-06-03: qty 2

## 2022-06-03 MED ORDER — AMIODARONE HCL 200 MG PO TABS
400.0000 mg | ORAL_TABLET | Freq: Two times a day (BID) | ORAL | Status: DC
  Administered 2022-06-03 – 2022-06-04 (×2): 400 mg via ORAL
  Filled 2022-06-03 (×2): qty 2

## 2022-06-03 MED ORDER — PROPOFOL 10 MG/ML IV BOLUS
INTRAVENOUS | Status: DC | PRN
  Administered 2022-06-03: 20 mg via INTRAVENOUS
  Administered 2022-06-03: 25 mg via INTRAVENOUS
  Administered 2022-06-03: 20 mg via INTRAVENOUS
  Administered 2022-06-03: 5 mg via INTRAVENOUS
  Administered 2022-06-03: 10 mg via INTRAVENOUS

## 2022-06-03 MED ORDER — BUTAMBEN-TETRACAINE-BENZOCAINE 2-2-14 % EX AERO
INHALATION_SPRAY | CUTANEOUS | Status: AC
  Filled 2022-06-03: qty 5

## 2022-06-03 MED ORDER — LIDOCAINE VISCOUS HCL 2 % MT SOLN
OROMUCOSAL | Status: AC
  Filled 2022-06-03: qty 15

## 2022-06-03 NOTE — Progress Notes (Signed)
Transesophageal Echocardiogram :  Indication: Persistent atrial fibrillation Requesting/ordering  physician:   Procedure: Benzocaine spray x2 and 2 mls x 2 of viscous lidocaine were given orally to provide local anesthesia to the oropharynx. The patient was positioned supine on the left side, bite block provided. The patient was moderately sedated with the doses of versed and fentanyl as detailed below.  Using digital technique an omniplane probe was advanced into the distal esophagus without incident.   Moderate sedation: 1. Sedation used: Per anesthesia, propofol given  See report in EPIC  for complete details: In brief, transgastric imaging revealed mildly depressed LV function with mild global hypokinesis and no mural apical thrombus.  .  Estimated ejection fraction was 45 to 50%, mild global hypokinesis.  Right sided cardiac chambers were normal with no evidence of pulmonary hypertension.  Imaging of the septum showed no ASD or VSD Bubble study was negative for shunt 2D and color flow confirmed no PFO  The LA was well visualized in orthogonal views.  There was no spontaneous contrast and no thrombus in the LA and LA appendage   The descending thoracic aorta had no  mural aortic debris with no evidence of aneurysmal dilation or dissection, moderate aortic atherosclerosis aortic arch, descending aorta  Cardioversion to follow   Ida Rogue 06/03/2022 1:43 PM

## 2022-06-03 NOTE — Progress Notes (Signed)
Physical Therapy Treatment Patient Details Name: Gavin Maxwell MRN: [id number] DOB: 1945/07/08 Today's Date: 06/03/2022   History of Present Illness Pt is a 77 y/o M admitted on 05/28/22 after presenting with c/o SOB & diarrhea. Pt is being treated for dyspnea & cough, likely viral illness, as well as NSTEMI. PMH: HTN, COPD, CKD3A, anemia, OSA on CPAP, NHL in remission, SVT    PT Comments    Pt is making gradual progress towards goals with ability to perform transfer from recliner back to bed about to go down for cardioversion this date. HR ranging from 84-104bpm with limited exertion. Does reports dizziness with transfer. Educated on B LE there-ex in prep for standing tolerance. If cleared by cardio, can begin ambulation with monitoring of HR next date. Will continue to progress.  Recommendations for follow up therapy are one component of a multi-disciplinary discharge planning process, led by the attending physician.  Recommendations may be updated based on patient status, additional functional criteria and insurance authorization.  Follow Up Recommendations  Home health PT     Assistance Recommended at Discharge Intermittent Supervision/Assistance  Patient can return home with the following A little help with walking and/or transfers;A little help with bathing/dressing/bathroom;Assistance with cooking/housework;Assist for transportation;Help with stairs or ramp for entrance   Equipment Recommendations  None recommended by PT    Recommendations for Other Services       Precautions / Restrictions Precautions Precautions: Fall Precaution Comments: watch HR Restrictions Weight Bearing Restrictions: No     Mobility  Bed Mobility Overal bed mobility: Needs Assistance Bed Mobility: Sit to Supine       Sit to supine: Min assist   General bed mobility comments: needs assist for B LEs and repositioning in bed.    Transfers Overall transfer level: Needs assistance Equipment  used: 1 person hand held assist Transfers: Bed to chair/wheelchair/BSC, Sit to/from Stand Sit to Stand: Min assist   Step pivot transfers: Min assist       General transfer comment: unable to use L arm, attempted with independence and pt unable to stand. Min assist required for transfer and then pt able to take a few steps over to bed. HR ranging from 84-104bpm with exertion    Ambulation/Gait               General Gait Details: pt about to leave for procedure. Ambulation not tested   Stairs             Wheelchair Mobility    Modified Rankin (Stroke Patients Only)       Balance Overall balance assessment: Needs assistance Sitting-balance support: Feet supported Sitting balance-Leahy Scale: Good     Standing balance support: During functional activity, Single extremity supported Standing balance-Leahy Scale: Fair                              Cognition Arousal/Alertness: Awake/alert Behavior During Therapy: WFL for tasks assessed/performed Overall Cognitive Status: Within Functional Limits for tasks assessed                                 General Comments: pleasant and agreeable        Exercises      General Comments        Pertinent Vitals/Pain Pain Assessment Pain Assessment: No/denies pain    Home Living  Prior Function            PT Goals (current goals can now be found in the care plan section) Acute Rehab PT Goals Patient Stated Goal: get better/stronger PT Goal Formulation: With patient Time For Goal Achievement: 06/15/22 Potential to Achieve Goals: Good Progress towards PT goals: Progressing toward goals    Frequency    Min 2X/week      PT Plan Current plan remains appropriate    Co-evaluation              AM-PAC PT "6 Clicks" Mobility   Outcome Measure  Help needed turning from your back to your side while in a flat bed without using bedrails?: A  Little Help needed moving from lying on your back to sitting on the side of a flat bed without using bedrails?: A Lot Help needed moving to and from a bed to a chair (including a wheelchair)?: A Little Help needed standing up from a chair using your arms (e.g., wheelchair or bedside chair)?: A Little Help needed to walk in hospital room?: A Little Help needed climbing 3-5 steps with a railing? : A Lot 6 Click Score: 16    End of Session   Activity Tolerance: Patient tolerated treatment well Patient left: in bed;with bed alarm set Nurse Communication: Mobility status PT Visit Diagnosis: Muscle weakness (generalized) (M62.81);Difficulty in walking, not elsewhere classified (R26.2)     Time: [id number] PT Time Calculation (min) (ACUTE ONLY): 17 min  Charges:  $Therapeutic Activity: 8-22 mins                     Greggory Stallion, PT, DPT, GCS [phone]    Gavin Maxwell 06/03/2022, 12:46 PM

## 2022-06-03 NOTE — Anesthesia Procedure Notes (Signed)
Date/Time: 06/03/2022 1:05 PM  Performed by: Johnna Acosta, CRNAPre-anesthesia Checklist: Patient identified, Emergency Drugs available, Suction available, Patient being monitored and Timeout performed Patient Re-evaluated:Patient Re-evaluated prior to induction Oxygen Delivery Method: Nasal cannula Preoxygenation: Pre-oxygenation with 100% oxygen Induction Type: IV induction

## 2022-06-03 NOTE — Progress Notes (Signed)
Progress Note  Patient Name: Gavin Maxwell Date of Encounter: 06/03/2022  Primary Cardiologist: Rockey Situ  Subjective   Remains in Afib/flutter with RVR with improved ventricular rates in the 90s to low 100s bpm.  Remains on amiodarone drip given limited options for rate control secondary to hypotension causing him to miss multiple doses of metoprolol.  No chest pain or palpitations.  Breathing improved and back at baseline.  He is n.p.o. for TEE guided DCCV today.  Inpatient Medications    Scheduled Meds:  [MAR Hold] apixaban  5 mg Oral BID   [MAR Hold] aspirin EC  81 mg Oral Daily   [MAR Hold] atorvastatin  80 mg Oral Daily   [MAR Hold] brimonidine  1 drop Both Eyes Q12H   [MAR Hold] calcium-vitamin D  1 tablet Oral Daily   [MAR Hold] fenofibrate  160 mg Oral Daily   [MAR Hold] ferrous sulfate  325 mg Oral Daily   [MAR Hold] fluticasone  2 spray Each Nare Daily   [MAR Hold] gabapentin  100 mg Oral TID   [MAR Hold] imipramine  25 mg Oral QHS   [MAR Hold] latanoprost  1 drop Both Eyes QHS   [MAR Hold] magnesium oxide  400 mg Oral TID   [MAR Hold] metoprolol tartrate  25 mg Oral BID   [MAR Hold] mometasone-formoterol  2 puff Inhalation BID   [MAR Hold] multivitamin with minerals  1 tablet Oral Daily   [MAR Hold] pantoprazole  40 mg Oral Daily   [MAR Hold] polycarbophil  625 mg Oral BID   [MAR Hold] polyethylene glycol  17 g Oral BID   [MAR Hold] sertraline  50 mg Oral QHS   [MAR Hold] sodium chloride  1 g Oral BID WC   [MAR Hold] timolol  1 drop Both Eyes BID   Continuous Infusions:  sodium chloride     amiodarone 60 mg/hr (06/03/22 1034)   PRN Meds: [MAR Hold] dextromethorphan-guaiFENesin, [MAR Hold] guaiFENesin-dextromethorphan, [MAR Hold] hydrALAZINE, [MAR Hold] levalbuterol, [MAR Hold] nitroGLYCERIN, [MAR Hold] ondansetron (ZOFRAN) IV, [MAR Hold] mouth rinse, [MAR Hold] phenol   Vital Signs    Vitals:   06/03/22 0401 06/03/22 0500 06/03/22 0847 06/03/22 1157  BP:  106/67  107/83 108/68  Pulse: (!) 54  95 87  Resp: '20  17 17  '$ Temp: (!) 97.5 F (36.4 C)  97.6 F (36.4 C) 97.6 F (36.4 C)  TempSrc:    Oral  SpO2: 100%  100% 93%  Weight:  98.5 kg    Height:        Intake/Output Summary (Last 24 hours) at 06/03/2022 1159 Last data filed at 06/03/2022 0914 Gross per 24 hour  Intake 124.61 ml  Output 1500 ml  Net -1375.39 ml    Filed Weights   06/01/22 0441 06/02/22 0500 06/03/22 0500  Weight: 99.7 kg 100.2 kg 98.5 kg    Telemetry    Afib/flutter with RVR with ventricular rates in the 90s to low 100s bpm - Personally Reviewed  ECG    No new tracings - Personally Reviewed  Physical Exam   GEN: No acute distress.   Neck: No JVD. Cardiac: Tachycardic, IRIR, I/VI systolic murmur RUSB, no rubs, or gallops.  Respiratory: Diminished breath sounds along the bases bilaterally.  GI: Soft, nontender, non-distended.   MS: No edema; No deformity. Neuro:  Alert and oriented x 3; Nonfocal.  Psych: Normal affect.  Labs    Chemistry Recent Labs  Lab 05/28/22 1009 05/28/22 1207 05/29/22 [id number]  05/29/22 1346 05/31/22 0436 06/01/22 0500 06/02/22 0900  NA 127*   < > 131*   < > 132* 133* 134*  K 3.9   < > 4.3   < > 4.3 4.2 4.8  CL 96*   < > 98   < > 105 105 104  CO2 19*   < > 21*   < > 19* 21* 21*  GLUCOSE 121*   < > 105*   < > 112* 90 139*  BUN 82*   < > 85*   < > 67* 57* 56*  CREATININE 4.20*   < > 3.75*   < > 2.25* 1.88* 1.77*  CALCIUM 8.5*   < > 8.4*   < > 8.5* 8.6* 8.7*  PROT 6.7  --  6.3*  --   --   --   --   ALBUMIN 2.9*  --  2.6*  --   --   --   --   AST 128*  --  73*  --   --   --   --   ALT 56*  --  50*  --   --   --   --   ALKPHOS 44  --  46  --   --   --   --   BILITOT 0.8  --  0.9  --   --   --   --   GFRNONAA 14*   < > 16*   < > 29* 36* 39*  ANIONGAP 12   < > 12   < > '8 7 9   '$ < > = values in this interval not displayed.      Hematology Recent Labs  Lab 05/30/22 0630 05/31/22 0436 06/01/22 0500  WBC 10.2 9.4 8.8   RBC 3.34* 3.42* 3.26*  HGB 10.5* 10.8* 10.2*  HCT 31.5* 32.3* 30.6*  MCV 94.3 94.4 93.9  MCH 31.4 31.6 31.3  MCHC 33.3 33.4 33.3  RDW 13.4 13.6 13.4  PLT 217 230 255     Cardiac EnzymesNo results for input(s): "TROPONINI" in the last 168 hours. No results for input(s): "TROPIPOC" in the last 168 hours.   BNP Recent Labs  Lab 05/28/22 1009  BNP 215.1*      DDimer No results for input(s): "DDIMER" in the last 168 hours.   Radiology    No results found.  Cardiac Studies   2D echo 05/29/2022: 1. Left ventricular ejection fraction, by estimation, is 60 to 65%. The  left ventricle has normal function. The left ventricle has no regional  wall motion abnormalities. There is moderate concentric left ventricular  hypertrophy. Left ventricular  diastolic parameters are indeterminate.   2. Right ventricular systolic function is normal. The right ventricular  size is normal.   3. The mitral valve is normal in structure. Mild mitral valve  regurgitation. No evidence of mitral stenosis.   4. The aortic valve is normal in structure. Aortic valve regurgitation is  not visualized. Aortic valve sclerosis/calcification is present, without  any evidence of aortic stenosis.   5. The inferior vena cava is normal in size with greater than 50%  respiratory variability, suggesting right atrial pressure of 3 mmHg.   Patient Profile     77 y.o. 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  Assessment & Plan    1. Acute respiratory dystress: -Viral respiratory  panel negative -Empiric ABX per IM -Agree with Xoepnex in place of albuterol given Afib/flutter -Improved  2. Afib/flutter with RVR: -Noted on presentation, rates have been difficult to control secondary to soft BP -Remains in Afib/flutter with RVR with ventricular rates in the 90s to 140s bpm -Relative hypotension led to the holding and ultimate discontinuation of metoprolol over the weekend with the only option for rate control being IV amiodarone -Currently on amiodarone gtt with difficult to control ventricular rates  -He is for TEE-guided DCCV for 2/27 -NPO  -CHADS2VASc at least 4 (HTN, age x 2, vascular disease) -Eliquis 5 mg bid, does not meet reduced dosing criteria at this time  3. NSTEMI: -Cannot exclude supply demand ischemia in the setting of Afib/flutter with RVR, acute hypoxic respiratory distress, and CKD -No chest pain -Previously not a good cath candidate due to acute on CKD, renal function has been improving -Completed 48 hours of heparin  -MPI not ideal with body habitus -If renal function remains close to baseline, could consider cardiac cath down the road (dyspnea improved) -ASA -Lipitor  4. Acute on CKD stage III: -Baseline SCr 1.8, presenting SCr of 4.2 -Felt to represent ATN in the setting of the above -Improving, now back to approximate baseline  5. Normocytic anemia: -Stable  6. Hyponatremia: -Improving  -Does not appear to be hypervolemic     Shared Decision Making/Informed Consent{  The risks [stroke, cardiac arrhythmias rarely resulting in the need for a temporary or permanent pacemaker, skin irritation or burns, esophageal damage, perforation (1:10,000 risk), bleeding, pharyngeal hematoma as well as other potential complications associated with conscious sedation including aspiration, arrhythmia, respiratory failure and death], benefits (treatment guidance, restoration of normal sinus rhythm, diagnostic support) and alternatives of a  transesophageal echocardiogram guided cardioversion were discussed in detail with Mr. Behrman and he is willing to proceed.   For questions or updates, please contact [place] Please consult [email or web address] for contact info under Cardiology/STEMI.    Signed, Christell Faith, PA-C [place] Pager: [phone] 06/03/2022, 11:59 AM

## 2022-06-03 NOTE — Progress Notes (Signed)
*  PRELIMINARY RESULTS* Echocardiogram Echocardiogram Transesophageal has been performed.  Gavin Maxwell 06/03/2022, 1:39 PM

## 2022-06-03 NOTE — TOC Progression Note (Signed)
Transition of Care [facility]) - Progression Note    Patient Details  Name: Gavin Maxwell MRN: [id number] Date of Birth: September 08, 1945  Transition of Care [facility]) CM/SW Contact  Laurena Slimmer, RN Phone Number: 06/03/2022, 4:29 PM  Clinical Narrative:    [facility] referral sent to [facility] from [place] for [facility] PT/ OT   Expected Discharge Plan: Home/Self Care Barriers to Discharge: Continued Medical Work up  Expected Discharge Plan and Services     Post Acute Care Choice: NA Living arrangements for the past 2 months: Single Family Home                                       Social Determinants of Health (SDOH) Interventions SDOH Screenings   Food Insecurity: No Food Insecurity (05/28/2022)  Housing: Low Risk  (05/28/2022)  Transportation Needs: No Transportation Needs (05/28/2022)  Utilities: Not At Risk (05/28/2022)  Tobacco Use: Medium Risk (05/28/2022)    Readmission Risk Interventions    05/29/2022   11:22 AM  Readmission Risk Prevention Plan  Transportation Screening Complete  PCP or Specialist Appt within 3-5 Days Complete  Social Work Consult for [place] Planning/Counseling [place] Not Applicable  Medication Review Press photographer) Complete

## 2022-06-03 NOTE — Anesthesia Postprocedure Evaluation (Signed)
Anesthesia Post Note  Patient: Gavin Maxwell  Procedure(s) Performed: TRANSESOPHAGEAL ECHOCARDIOGRAM CARDIOVERSION  Patient location during evaluation: Specials Recovery Anesthesia Type: General Level of consciousness: awake and alert Pain management: pain level controlled Vital Signs Assessment: post-procedure vital signs reviewed and stable Respiratory status: spontaneous breathing, nonlabored ventilation, respiratory function stable and patient connected to nasal cannula oxygen Cardiovascular status: blood pressure returned to baseline and stable Postop Assessment: no apparent nausea or vomiting Anesthetic complications: no   No notable events documented.   Last Vitals:  Vitals:   06/03/22 1342 06/03/22 1345  BP:  107/68  Pulse: 78 100  Resp: 14 11  Temp:    SpO2: 91% 94%    Last Pain:  Vitals:   06/03/22 1157  TempSrc: Oral  PainSc: 0-No pain                 Ilene Qua

## 2022-06-03 NOTE — CV Procedure (Signed)
Cardioversion procedure note For atrial fibrillation, persistent  Procedure Details:  Consent: Risks of procedure as well as the alternatives and risks of each were explained to the (patient/caregiver).  Consent for procedure obtained.  Time Out: Verified patient identification, verified procedure, site/side was marked, verified correct patient position, special equipment/implants available, medications/allergies/relevent history reviewed, required imaging and test results available.  Performed  Patient placed on cardiac monitor, pulse oximetry, supplemental oxygen as necessary.   Sedation given: propofol IV, Dr. Danne Baxter Pacer pads placed anterior and posterior chest.   Cardioverted 1 time(s).   Cardioverted at  150 J. Synchronized biphasic Converted to NSR   Evaluation: Findings: Post procedure EKG shows: NSR Complications: None Patient did tolerate procedure well.  Time Spent Directly with the Patient:  30 minutes   Esmond Plants, M.D., Ph.D.

## 2022-06-03 NOTE — Transfer of Care (Signed)
Immediate Anesthesia Transfer of Care Note  Patient: Gavin Maxwell  Procedure(s) Performed: TRANSESOPHAGEAL ECHOCARDIOGRAM CARDIOVERSION  Patient Location:  3rd floor cardiology  Anesthesia Type:General  Level of Consciousness: sedated and drowsy  Airway & Oxygen Therapy: Patient Spontanous Breathing and Patient connected to nasal cannula oxygen  Post-op Assessment: Report given to RN and Post -op Vital signs reviewed and stable  Post vital signs: Reviewed and stable  Last Vitals:  Vitals Value Taken Time  BP 108/59 06/03/22 1340  Temp    Pulse 78 06/03/22 1340  Resp 12 06/03/22 1340  SpO2 93 % 06/03/22 1340    Last Pain:  Vitals:   06/03/22 1157  TempSrc: Oral  PainSc: 0-No pain      Patients Stated Pain Goal: 0 ([id number])  Complications: No notable events documented.

## 2022-06-03 NOTE — Anesthesia Preprocedure Evaluation (Addendum)
Anesthesia Evaluation  Patient identified by MRN, date of birth, ID band Patient awake    Reviewed: Allergy & Precautions, NPO status , Patient's Chart, lab work & pertinent test results  Airway Mallampati: III  TM Distance: >3 FB Neck ROM: full    Dental  (+) Chipped   Pulmonary former smoker   Pulmonary exam normal        Cardiovascular hypertension, +CHF  + dysrhythmias Atrial Fibrillation  Rhythm:Irregular Rate:Tachycardia     Neuro/Psych  PSYCHIATRIC DISORDERS Anxiety Depression     Neuromuscular disease    GI/Hepatic Neg liver ROS, hiatal hernia,GERD  ,,  Endo/Other  negative endocrine ROS    Renal/GU CRF and ARFRenal disease  negative genitourinary   Musculoskeletal   Abdominal   Peds  Hematology  (+) Blood dyscrasia, anemia   Anesthesia Other Findings Past Medical History: No date: Anxiety No date: Cancer [facility])     Comment:  non hodgins  lymphoma   2004 in remission  Left arm                wears a brace there is a bone broken No date: CKD (chronic kidney disease) stage 3, GFR 30-59 ml/min (HCC) No date: COPD (chronic obstructive pulmonary disease) (HCC) No date: Depression No date: Dysrhythmia     Comment:  SVT No date: GERD (gastroesophageal reflux disease) No date: History of hiatal hernia No date: Hypertension No date: Pneumonia 09/26/2014: Primary localized osteoarthritis of knee 10/04/2013: Primary localized osteoarthrosis, shoulder region,  rotator cuff arthropathy No date: Sleep apnea     Comment:  cpap  Past Surgical History: No date: CARDIAC CATHETERIZATION     Comment:  20 yrs. ago No date: CHOLECYSTECTOMY No date: EYE SURGERY; Left     Comment:  pt states he was "seeing double" and they did surgery to              fix it No date: NASAL SINUS SURGERY     Comment:  x2 10/04/2013: REVERSE SHOULDER ARTHROPLASTY; Right     Comment:  Procedure: REVERSE SHOULDER ARTHROPLASTY;  Surgeon:                Johnny Bridge, MD;  Location: [place];  Service:               Orthopedics;  Laterality: Right; No date: SHOULDER ACROMIOPLASTY; Left     Comment:  x 5 shoulder surgeries 03/07/2019: SVT ABLATION; N/A     Comment:  Procedure: SVT ABLATION;  Surgeon: Constance Haw,              MD;  Location: [place] CV LAB;  Service:               Cardiovascular;  Laterality: N/A; 07/26/2019: TOTAL HIP ARTHROPLASTY; Right     Comment:  Procedure: TOTAL HIP ARTHROPLASTY;  Surgeon: Marchia Bond, MD;  Location: [facility];  Service: Orthopedics;                Laterality: Right; 1998: TOTAL HIP ARTHROPLASTY; Left 09/26/2014: TOTAL KNEE ARTHROPLASTY; Right     Comment:  Procedure: RIGHT TOTAL KNEE ARTHROPLASTY;  Surgeon:               Marchia Bond, MD;  Location: [place];  Service:               Orthopedics;  Laterality: Right; 06/05/2021: TRANSFORAMINAL LUMBAR  INTERBODY FUSION (TLIF) WITH PEDICLE  SCREW FIXATION 2 LEVEL; Right     Comment:  Procedure: RIGHT-SIDED LUMBAR 3- LUMBAR 4, LUMBAR 4-               LUMBAR 5 TRANSFORAMINAL LUMBAR INTERBODY FUSION AND               DECOMPRESSION WITH INSTRUMENTATION AND ALLOGRAFT;                Surgeon: Phylliss Bob, MD;  Location: [place];  Service:               Orthopedics;  Laterality: Right; 08/18/2017: VIDEO ASSISTED THORACOSCOPY (VATS) W/TALC PLEUADESIS;  Right     Comment:  Procedure: VIDEO ASSISTED THORACOSCOPY (VATS)POSSIBLE                W/TALC PLEUADESIS.POSSIBLE BLEBECTOMY;  Surgeon: Nestor Lewandowsky, MD;  Location: [facility];  Service: Thoracic;                Laterality: Right;  BMI    Body Mass Index: 30.29 kg/m      Reproductive/Obstetrics negative OB ROS                             Anesthesia Physical Anesthesia Plan  ASA: 3  Anesthesia Plan: General   Post-op Pain Management: Minimal or no pain anticipated   Induction: Intravenous  PONV Risk Score and Plan:  Propofol infusion and TIVA  Airway Management Planned: Natural Airway and Nasal Cannula  Additional Equipment:   Intra-op Plan:   Post-operative Plan:   Informed Consent: I have reviewed the patients History and Physical, chart, labs and discussed the procedure including the risks, benefits and alternatives for the proposed anesthesia with the patient or authorized representative who has indicated his/her understanding and acceptance.     Dental Advisory Given  Plan Discussed with: Anesthesiologist, CRNA and Surgeon  Anesthesia Plan Comments: (Patient consented for risks of anesthesia including but not limited to:  - adverse reactions to medications - risk of airway placement if required - damage to eyes, teeth, lips or other oral mucosa - nerve damage due to positioning  - sore throat or hoarseness - Damage to heart, brain, nerves, lungs, other parts of body or loss of life  Patient voiced understanding.)        Anesthesia Quick Evaluation

## 2022-06-03 NOTE — Progress Notes (Addendum)
Occupational Therapy Treatment Patient Details Name: Gavin Maxwell MRN: [id number] DOB: Sep 27, 1945 Today's Date: 06/03/2022   History of present illness Pt is a 77 y/o M admitted on 05/28/22 after presenting with c/o SOB & diarrhea. Pt is being treated for dyspnea & cough, likely viral illness, as well as NSTEMI. PMH: HTN, COPD, CKD3A, anemia, OSA on CPAP, NHL in remission, SVT   OT comments  MD cleared patient for participation in therapy s/p cardioversion. Pt received sitting up in bed completing self-feeding. Spouse present and both agreeable to OT. Tx session targeted improving tolerance for functional mobility in the setting of ADL tasks. Pt able to come to EOB with supervision-CGA and then engaged in UB dressing with Min A. Pt then stood from EOB with Min guard and completed step pivot transfer to bedside recliner with Min A via HHA. Pt left sitting in recliner with all needs in reach. Pt is making progress toward goal completion. D/C recommendation remains appropriate. OT will continue to follow acutely.    Recommendations for follow up therapy are one component of a multi-disciplinary discharge planning process, led by the attending physician.  Recommendations may be updated based on patient status, additional functional criteria and insurance authorization.    Follow Up Recommendations  Home health OT     Assistance Recommended at Discharge Intermittent Supervision/Assistance  Patient can return home with the following  A little help with walking and/or transfers;A little help with bathing/dressing/bathroom   Equipment Recommendations  None recommended by OT    Recommendations for Other Services      Precautions / Restrictions Precautions Precautions: Fall Precaution Comments: watch HR Restrictions Weight Bearing Restrictions: No       Mobility Bed Mobility Overal bed mobility: Needs Assistance Bed Mobility: Sit to Supine     Supine to sit: Min guard, HOB elevated,  Supervision     General bed mobility comments: increased time/effort required but no physical assistance provided    Transfers Overall transfer level: Needs assistance Equipment used: 1 person hand held assist Transfers: Bed to chair/wheelchair/BSC, Sit to/from Stand Sit to Stand: Min guard     Step pivot transfers: Min assist (from EOB>recliner via HHA)     General transfer comment: pt able to push up from surface of bed to stand     Balance Overall balance assessment: Needs assistance Sitting-balance support: Feet supported Sitting balance-Leahy Scale: Good     Standing balance support: During functional activity, Single extremity supported Standing balance-Leahy Scale: Fair                             ADL either performed or assessed with clinical judgement   ADL Overall ADL's : Needs assistance/impaired Eating/Feeding: Modified independent;Bed level               Upper Body Dressing : Minimal assistance;Sitting Upper Body Dressing Details (indicate cue type and reason): to doff gown and t-shirt then don new gown     Toilet Transfer: Magazine features editor Details (indicate cue type and reason): simulated         Functional mobility during ADLs: Min guard      Extremity/Trunk Assessment Upper Extremity Assessment Upper Extremity Assessment: Generalized weakness   Lower Extremity Assessment Lower Extremity Assessment: Generalized weakness        Vision Baseline Vision/History: 1 Wears glasses Patient Visual Report: No change from baseline     Perception     Praxis  Cognition Arousal/Alertness: Awake/alert Behavior During Therapy: WFL for tasks assessed/performed Overall Cognitive Status: Within Functional Limits for tasks assessed                                          Exercises      Shoulder Instructions       General Comments HR 80s-100s with activity, SpO2 maintained >90% on RA.     Pertinent Vitals/ Pain       Pain Assessment Pain Assessment: No/denies pain  Home Living                                          Prior Functioning/Environment              Frequency  Min 2X/week        Progress Toward Goals  OT Goals(current goals can now be found in the care plan section)  Progress towards OT goals: Progressing toward goals  Acute Rehab OT Goals Patient Stated Goal: to go home OT Goal Formulation: With patient Time For Goal Achievement: 06/16/22 Potential to Achieve Goals: [place] Discharge plan remains appropriate;Frequency remains appropriate    Co-evaluation                 AM-PAC OT "6 Clicks" Daily Activity     Outcome Measure   Help from another person eating meals?: None Help from another person taking care of personal grooming?: None Help from another person toileting, which includes using toliet, bedpan, or urinal?: A Little Help from another person bathing (including washing, rinsing, drying)?: A Little Help from another person to put on and taking off regular upper body clothing?: None Help from another person to put on and taking off regular lower body clothing?: A Little 6 Click Score: 21    End of Session Equipment Utilized During Treatment: Gait belt  OT Visit Diagnosis: Muscle weakness (generalized) (M62.81);Unsteadiness on feet (R26.81)   Activity Tolerance Patient tolerated treatment well   Patient Left in chair;with call bell/phone within reach;with family/visitor present;with chair alarm set   Nurse Communication Mobility status        Time: [id number] OT Time Calculation (min): 19 min  Charges: OT General Charges $OT Visit: 1 Visit OT Treatments $Self Care/Home Management : 8-22 mins  [facility] MS, OTR/L ascom [phone]  06/03/22, 6:06 PM

## 2022-06-03 NOTE — TOC Progression Note (Signed)
Transition of Care [facility]) - Progression Note    Patient Details  Name: ANCHOR HUISINGA MRN: [id number] Date of Birth: [birth year]  Transition of Care [facility]) CM/SW Contact  Laurena Slimmer, RN Phone Number: 06/03/2022, 4:28 PM  Clinical Narrative:    Spoke with patient at bedside regarding [place] recommendation. Patient is agreeable to [facility].     Expected Discharge Plan: Home/Self Care Barriers to Discharge: Continued Medical Work up  Expected Discharge Plan and Services     Post Acute Care Choice: NA Living arrangements for the past 2 months: Single Family Home                                       Social Determinants of Health (SDOH) Interventions SDOH Screenings   Food Insecurity: No Food Insecurity (05/28/2022)  Housing: Low Risk  (05/28/2022)  Transportation Needs: No Transportation Needs (05/28/2022)  Utilities: Not At Risk (05/28/2022)  Tobacco Use: Medium Risk (05/28/2022)    Readmission Risk Interventions    05/29/2022   11:22 AM  Readmission Risk Prevention Plan  Transportation Screening Complete  PCP or Specialist Appt within 3-5 Days Complete  Social Work Consult for [place] Planning/Counseling Complete  Palliative Care Screening Not Applicable  Medication Review [employer]) Complete

## 2022-06-03 NOTE — Progress Notes (Signed)
[facility] [place] [facility] [place] NAME: Gavin Maxwell    MR#:  [id number]  DATE OF BIRTH:  07/30/1945  SUBJECTIVE:   wife family at bedside. Hr 90-110's Denies any chest pain. Did have shortness of breath with elevated heart rate when earlier tried to work with therapy. No cough or fever.   VITALS:  Blood pressure (!) 108/59, pulse 78, temperature 97.6 F (36.4 C), temperature source Oral, resp. rate 14, height '5\' 11"'$  (1.803 m), weight 98.5 kg, SpO2 91 %.  PHYSICAL EXAMINATION:   GENERAL:  77 y.o.-year-old patient with no acute distress. Morbid obesity LUNGS: Normal breath sounds bilaterally, no wheezing CARDIOVASCULAR: S1, S2 normal. No murmur tachycardia ABDOMEN: Soft, nontender, nondistended. Bowel sounds present.  EXTREMITIES: +  edema b/l.    NEUROLOGIC: nonfocal  patient is alert and awake SKIN: No obvious rash, lesion, or ulcer.   LABORATORY PANEL:  CBC Recent Labs  Lab 06/01/22 0500  WBC 8.8  HGB 10.2*  HCT 30.6*  PLT 255     Chemistries  Recent Labs  Lab 05/29/22 0957 05/29/22 1346 06/01/22 0500 06/02/22 0900  NA 131*   < > 133* 134*  K 4.3   < > 4.2 4.8  CL 98   < > 105 104  CO2 21*   < > 21* 21*  GLUCOSE 105*   < > 90 139*  BUN 85*   < > 57* 56*  CREATININE 3.75*   < > 1.88* 1.77*  CALCIUM 8.4*   < > 8.6* 8.7*  MG  --    < > 1.6*  --   AST 73*  --   --   --   ALT 50*  --   --   --   ALKPHOS 46  --   --   --   BILITOT 0.9  --   --   --    < > = values in this interval not displayed.    Assessment and Plan  Gavin Maxwell is a 77 y.o. male with medical history significant of HTN, COPD not on oxygen at baseline, CKD 3A, anemia, OSA on CPAP, NHL in remission, SVT, who presented with shortness breath and diarrhea.   Patient states that has SOB for about 5 day, which has been progressively worsening.  Patient was seen by PCP on 2/19, and diagnosed with COPD exacerbation.  Patient given prescription for doxycycline  and prednisone, without significant improvement.  Atrial fibrillation with RVR ([place]): New onset --new onset.  CHADS2 score is 5 (hypertension, age 36, CHF, CAD).  --transition from heparin gtt to Eliquis --IV Amiodarone gtt per cardiology. Hold BB due to low BP --Plans for TEE/CV noted for today   Dyspnea and cough Acute respiratory failure ruled out --likely viral illness.  Resp panel neg, RVP neg. --Mucomyst neb BID and xopenex BID (due to tachy)   NSTEMI (non-ST elevated myocardial infarction) [facility]):   --In the setting of respiratory failure, viral syndrome, atrial fibrillation with RVR, renal failure --Troponin 2100, trending downward Normal LV function with no regional wall motion abnormality on echo --started on heparin gtt.  Cardiology consulted---> Not a good candidate for cardiac catheterization given renal failure. Change to po eliquis. Large body habitus would make Myoview less appealing. --Ischemic workup deferred for now.   -If renal function improves back to baseline, could consider cardiac catheterization at a later date    Chronic diastolic congestive heart failure ([place]) Ruled out acute exacerbation  COPD with acute exacerbation [facility]):  Currently patient does not have wheezing. -continue outpatient prescribed prednisone and doxycycline -Bronchodilators    Essential hypertension --BP varied, sometimes hypotensive  control   Hyperlipidemia -Lipitor and fenofibrate   Acute renal failure superimposed on stage 3b chronic kidney disease ([place]):  --Baseline creatinine 1.8, presenting creatinine 4.2.  Likely due to dehydration secondary to diarrhea, and hemodynamic instability with NSAID use.  -Patient received 1 L normal saline bolus in the ED  --  Cr improved to 1.7 today. --nephro consult appreciated--now signed off -hold Mobic   Diarrhea - C. difficile and GI pathogen panel both neg   Abnormal LFTs, mild --unclear etiology -acute hepatitis panel neg    Hyponatremia:  Sodium 127, mental status normal.  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  >50% time spent on counselling and coordination of care  Note: This dictation was prepared with Dragon dictation along with smaller phrase technology. Any transcriptional errors that result from this process are unintentional.  Fritzi Mandes M.D    [facility]   CC: Primary care physician; Idelle Crouch, MD

## 2022-06-04 ENCOUNTER — Other Ambulatory Visit (HOSPITAL_COMMUNITY): Payer: Self-pay

## 2022-06-04 ENCOUNTER — Encounter: Payer: Self-pay | Admitting: Cardiovascular Disease

## 2022-06-04 DIAGNOSIS — I214 Non-ST elevation (NSTEMI) myocardial infarction: Secondary | ICD-10-CM | POA: Diagnosis not present

## 2022-06-04 LAB — PROTEIN ELECTROPHORESIS, SERUM
A/G Ratio: 0.8 (ref 0.7–1.7)
Albumin ELP: 2.4 g/dL — ABNORMAL LOW (ref 2.9–4.4)
Alpha-1-Globulin: 0.3 g/dL (ref 0.0–0.4)
Alpha-2-Globulin: 1 g/dL (ref 0.4–1.0)
Beta Globulin: 0.7 g/dL (ref 0.7–1.3)
Gamma Globulin: 1 g/dL (ref 0.4–1.8)
Globulin, Total: 3 g/dL (ref 2.2–3.9)
Total Protein ELP: 5.4 g/dL — ABNORMAL LOW (ref 6.0–8.5)

## 2022-06-04 LAB — BASIC METABOLIC PANEL
Anion gap: 8 (ref 5–15)
BUN: 47 mg/dL — ABNORMAL HIGH (ref 8–23)
CO2: 22 mmol/L (ref 22–32)
Calcium: 8.7 mg/dL — ABNORMAL LOW (ref 8.9–10.3)
Chloride: 102 mmol/L (ref 98–111)
Creatinine, Ser: 1.69 mg/dL — ABNORMAL HIGH (ref 0.61–1.24)
GFR, Estimated: 41 mL/min — ABNORMAL LOW (ref >60)
Glucose, Bld: 108 mg/dL — ABNORMAL HIGH (ref 70–99)
Potassium: 5.3 mmol/L — ABNORMAL HIGH (ref 3.5–5.1)
Sodium: 132 mmol/L — ABNORMAL LOW (ref 135–145)

## 2022-06-04 LAB — MAGNESIUM: Magnesium: 1.9 mg/dL (ref 1.7–2.4)

## 2022-06-04 MED ORDER — SODIUM ZIRCONIUM CYCLOSILICATE 5 G PO PACK: 5.0000 g | PACK | Freq: Once | ORAL | Status: DC

## 2022-06-04 MED ORDER — APIXABAN 5 MG PO TABS: 5.0000 mg | ORAL_TABLET | Freq: Two times a day (BID) | ORAL | 0 refills | Status: DC

## 2022-06-04 MED ORDER — AMIODARONE HCL 400 MG PO TABS: ORAL_TABLET | ORAL | 0 refills | Status: DC

## 2022-06-04 MED ORDER — DAPAGLIFLOZIN PROPANEDIOL 10 MG PO TABS: 10.0000 mg | ORAL_TABLET | Freq: Every day | ORAL | 0 refills | Status: DC

## 2022-06-04 MED ORDER — LOSARTAN POTASSIUM 25 MG PO TABS: 25.0000 mg | ORAL_TABLET | Freq: Every day | ORAL | Status: DC

## 2022-06-04 MED ORDER — DAPAGLIFLOZIN PROPANEDIOL 10 MG PO TABS
10.0000 mg | ORAL_TABLET | Freq: Every day | ORAL | Status: DC
  Administered 2022-06-04: 10 mg via ORAL
  Filled 2022-06-04: qty 1

## 2022-06-04 NOTE — Progress Notes (Signed)
[facility]  ROUNDING NOTE   Subjective:   Sitting up in chair Just completed PT session Room air No lower extremity edema  Creatinine 1.69 (1.77) (1.88) (2.25) (2.79) UOP 1.2L   Objective:  Vital signs in last 24 hours:  Temp:  [97.5 F (36.4 C)-98.2 F (36.8 C)] 97.5 F (36.4 C) (02/28 0827) Pulse Rate:  [74-101] 101 (02/28 0827) Resp:  [11-24] 18 (02/28 0827) BP: (96-129)/(47-80) 129/80 (02/28 0827) SpO2:  [91 %-100 %] 98 % (02/28 0827) Weight:  [98.1 kg] 98.1 kg (02/28 0501)  Weight change: -0.4 kg Filed Weights   06/02/22 0500 06/03/22 0500 06/04/22 0501  Weight: 100.2 kg 98.5 kg 98.1 kg    Intake/Output: I/O last 3 completed shifts: In: 540 [P.O.:540] Out: 2201 [Urine:2200; Stool:1]   Intake/Output this shift:  No intake/output data recorded.  Physical Exam: General: NAD, sitting up in chair   Head: Normocephalic, atraumatic. Moist oral mucosal membranes  Eyes: Anicteric  Lungs:  Clear to auscultation, room air  Heart: irregular  Abdomen:  Soft, nontender  Extremities:  trace peripheral edema.  Neurologic: Alert and oriented, moving all four extremities  Skin: No lesions  Access: None    Basic Metabolic Panel: Recent Labs  Lab 05/29/22 0618 05/29/22 0957 05/30/22 0630 05/31/22 0436 06/01/22 0500 06/02/22 0900 06/04/22 1031  NA 130*   < > 132* 132* 133* 134* 132*  K 4.1   < > 4.0 4.3 4.2 4.8 5.3*  CL 99   < > 103 105 105 104 102  CO2 20*   < > 20* 19* 21* 21* 22  GLUCOSE 98   < > 106* 112* 90 139* 108*  BUN 85*   < > 74* 67* 57* 56* 47*  CREATININE 3.71*   < > 2.79* 2.25* 1.88* 1.77* 1.69*  CALCIUM 8.1*   < > 8.2* 8.5* 8.6* 8.7* 8.7*  MG 1.8  --  1.9 1.8 1.6*  --   --   PHOS  --   --  3.0  --   --   --   --    < > = values in this interval not displayed.     Liver Function Tests: Recent Labs  Lab 05/29/22 0957  AST 73*  ALT 50*  ALKPHOS 46  BILITOT 0.9  PROT 6.3*  ALBUMIN 2.6*    No results for input(s):  "LIPASE", "AMYLASE" in the last 168 hours. No results for input(s): "AMMONIA" in the last 168 hours.  CBC: Recent Labs  Lab 05/29/22 0618 05/30/22 0630 05/31/22 0436 06/01/22 0500  WBC 10.3 10.2 9.4 8.8  HGB 10.3* 10.5* 10.8* 10.2*  HCT 30.1* 31.5* 32.3* 30.6*  MCV 92.6 94.3 94.4 93.9  PLT 220 217 230 255     Cardiac Enzymes: No results for input(s): "CKTOTAL", "CKMB", "CKMBINDEX", "TROPONINI" in the last 168 hours.  BNP: Invalid input(s): "POCBNP"  CBG: No results for input(s): "GLUCAP" in the last 168 hours.  Microbiology: Results for orders placed or performed during the hospital encounter of 05/28/22  Resp panel by RT-PCR (RSV, Flu A&B, Covid)     Status: None   Collection Time: 05/29/22  9:54 AM  Result Value Ref Range Status   SARS Coronavirus 2 by RT PCR NEGATIVE NEGATIVE Final    Comment: (NOTE) SARS-CoV-2 target nucleic acids are NOT DETECTED.  The SARS-CoV-2 RNA is generally detectable in upper respiratory specimens during the acute phase of infection. The lowest concentration of SARS-CoV-2 viral copies this assay can detect  is 138 copies/mL. A negative result does not preclude SARS-Cov-2 infection and should not be used as the sole basis for treatment or other patient management decisions. A negative result may occur with  improper specimen collection/handling, submission of specimen other than nasopharyngeal swab, presence of viral mutation(s) within the areas targeted by this assay, and inadequate number of viral copies(<138 copies/mL). A negative result must be combined with clinical observations, patient history, and epidemiological information. The expected result is Negative.  Fact Sheet for Patients:  EntrepreneurPulse.com.au  Fact Sheet for Healthcare Providers:  IncredibleEmployment.be  This test is no t yet approved or cleared by the [place] FDA and  has been authorized for detection and/or diagnosis  of SARS-CoV-2 by FDA under an Emergency Use Authorization (EUA). This EUA will remain  in effect (meaning this test can be used) for the duration of the COVID-19 declaration under Section 564(b)(1) of the Act, 21 U.S.C.section 360bbb-3(b)(1), unless the authorization is terminated  or revoked sooner.       Influenza A by PCR NEGATIVE NEGATIVE Final   Influenza B by PCR NEGATIVE NEGATIVE Final    Comment: (NOTE) The Xpert Xpress SARS-CoV-2/FLU/RSV plus assay is intended as an aid in the diagnosis of influenza from Nasopharyngeal swab specimens and should not be used as a sole basis for treatment. Nasal washings and aspirates are unacceptable for Xpert Xpress SARS-CoV-2/FLU/RSV testing.  Fact Sheet for Patients: EntrepreneurPulse.com.au  Fact Sheet for Healthcare Providers: IncredibleEmployment.be  This test is not yet approved or cleared by the [place] FDA and has been authorized for detection and/or diagnosis of SARS-CoV-2 by FDA under an Emergency Use Authorization (EUA). This EUA will remain in effect (meaning this test can be used) for the duration of the COVID-19 declaration under Section 564(b)(1) of the Act, 21 U.S.C. section 360bbb-3(b)(1), unless the authorization is terminated or revoked.     Resp Syncytial Virus by PCR NEGATIVE NEGATIVE Final    Comment: (NOTE) Fact Sheet for Patients: EntrepreneurPulse.com.au  Fact Sheet for Healthcare Providers: IncredibleEmployment.be  This test is not yet approved or cleared by the [place] FDA and has been authorized for detection and/or diagnosis of SARS-CoV-2 by FDA under an Emergency Use Authorization (EUA). This EUA will remain in effect (meaning this test can be used) for the duration of the COVID-19 declaration under Section 564(b)(1) of the Act, 21 U.S.C. section 360bbb-3(b)(1), unless the authorization is terminated  or revoked.  Performed at [facility], [place], [place] [address]   Respiratory (~20 pathogens) panel by PCR     Status: None   Collection Time: 05/29/22  5:16 PM   Specimen: Nasopharyngeal Swab; Respiratory  Result Value Ref Range Status   Adenovirus NOT DETECTED NOT DETECTED Final   Coronavirus 229E NOT DETECTED NOT DETECTED Final    Comment: (NOTE) The Coronavirus on the Respiratory Panel, DOES NOT test for the novel  Coronavirus (2019 nCoV)    Coronavirus HKU1 NOT DETECTED NOT DETECTED Final   Coronavirus NL63 NOT DETECTED NOT DETECTED Final   Coronavirus OC43 NOT DETECTED NOT DETECTED Final   Metapneumovirus NOT DETECTED NOT DETECTED Final   Rhinovirus / Enterovirus NOT DETECTED NOT DETECTED Final   Influenza A NOT DETECTED NOT DETECTED Final   Influenza B NOT DETECTED NOT DETECTED Final   Parainfluenza Virus 1 NOT DETECTED NOT DETECTED Final   Parainfluenza Virus 2 NOT DETECTED NOT DETECTED Final   Parainfluenza Virus 3 NOT DETECTED NOT DETECTED Final   Parainfluenza Virus  4 NOT DETECTED NOT DETECTED Final   Respiratory Syncytial Virus NOT DETECTED NOT DETECTED Final   Bordetella pertussis NOT DETECTED NOT DETECTED Final   Bordetella Parapertussis NOT DETECTED NOT DETECTED Final   Chlamydophila pneumoniae NOT DETECTED NOT DETECTED Final   Mycoplasma pneumoniae NOT DETECTED NOT DETECTED Final    Comment: Performed at [facility], [place] [address], [place], [place] [address]  C Difficile Quick Screen w PCR reflex     Status: None   Collection Time: 05/30/22 12:05 AM   Specimen: STOOL  Result Value Ref Range Status   C Diff antigen NEGATIVE NEGATIVE Final   C Diff toxin NEGATIVE NEGATIVE Final   C Diff interpretation No C. difficile detected.  Final    Comment: Performed at [facility], [address] [place], [place] [address]  Gastrointestinal Panel by PCR , Stool     Status: None   Collection Time: 05/30/22 12:05 AM    Specimen: Stool  Result Value Ref Range Status   Campylobacter species NOT DETECTED NOT DETECTED Final   Plesimonas shigelloides NOT DETECTED NOT DETECTED Final   Salmonella species NOT DETECTED NOT DETECTED Final   Yersinia enterocolitica NOT DETECTED NOT DETECTED Final   Vibrio species NOT DETECTED NOT DETECTED Final   Vibrio cholerae NOT DETECTED NOT DETECTED Final   Enteroaggregative E coli (EAEC) NOT DETECTED NOT DETECTED Final   Enteropathogenic E coli (EPEC) NOT DETECTED NOT DETECTED Final   Enterotoxigenic E coli (ETEC) NOT DETECTED NOT DETECTED Final   Shiga like toxin producing E coli (STEC) NOT DETECTED NOT DETECTED Final   Shigella/Enteroinvasive E coli (EIEC) NOT DETECTED NOT DETECTED Final   Cryptosporidium NOT DETECTED NOT DETECTED Final   Cyclospora cayetanensis NOT DETECTED NOT DETECTED Final   Entamoeba histolytica NOT DETECTED NOT DETECTED Final   Giardia lamblia NOT DETECTED NOT DETECTED Final   Adenovirus F40/41 NOT DETECTED NOT DETECTED Final   Astrovirus NOT DETECTED NOT DETECTED Final   Norovirus GI/GII NOT DETECTED NOT DETECTED Final   Rotavirus A NOT DETECTED NOT DETECTED Final   Sapovirus (I, II, IV, and V) NOT DETECTED NOT DETECTED Final    Comment: Performed at [facility], [address] [place], [place] [address]    Coagulation Studies: Recent Labs    06/02/22 1907  LABPROT 18.2*  INR 1.5*     Urinalysis: No results for input(s): "COLORURINE", "LABSPEC", "PHURINE", "GLUCOSEU", "HGBUR", "BILIRUBINUR", "KETONESUR", "PROTEINUR", "UROBILINOGEN", "NITRITE", "LEUKOCYTESUR" in the last 72 hours.  Invalid input(s): "APPERANCEUR"     Imaging: No results found.   Medications:      amiodarone  400 mg Oral BID   apixaban  5 mg Oral BID   aspirin EC  81 mg Oral Daily   atorvastatin  80 mg Oral Daily   brimonidine  1 drop Both Eyes Q12H   calcium-vitamin D  1 tablet Oral Daily   dapagliflozin propanediol  10 mg Oral Daily    fenofibrate  160 mg Oral Daily   ferrous sulfate  325 mg Oral Daily   fluticasone  2 spray Each Nare Daily   gabapentin  100 mg Oral TID   imipramine  25 mg Oral QHS   latanoprost  1 drop Both Eyes QHS   magnesium oxide  400 mg Oral TID   metoprolol tartrate  25 mg Oral BID   mometasone-formoterol  2 puff Inhalation BID   multivitamin with minerals  1 tablet Oral Daily   pantoprazole  40 mg  Oral Daily   polycarbophil  625 mg Oral BID   polyethylene glycol  17 g Oral BID   sertraline  50 mg Oral QHS   sodium chloride  1 g Oral BID WC   timolol  1 drop Both Eyes BID   dextromethorphan-guaiFENesin, guaiFENesin-dextromethorphan, hydrALAZINE, levalbuterol, nitroGLYCERIN, ondansetron (ZOFRAN) IV, mouth rinse, phenol  Assessment/ Plan:  [name] is a 77 y.o.  male with past medical conditions including hyperlipidemia, COPD, hypertension, anemia, depression and anxiety, obstructive sleep apnea with CPAP, and chronic kidney disease stage IIIa, who was admitted to [facility] on 05/28/2022 for NSTEMI (non-ST elevated myocardial infarction) ([place] [address]) [I21.4] Atrial fibrillation with rapid ventricular response ([place]) [I48.91] Acute renal failure superimposed on chronic kidney disease, unspecified acute renal failure type, unspecified CKD stage ([facility]) [N17.9, N18.9]   Acute kidney injury with proteinuria on chronic kidney disease IIIa. Baseline creatinine 1.8 with GFR 38 on 02/22/23. Acute kidney injury likely secondary to hemodynamic instability with NSAID use. NSTEMI on admission. UA positive for protein.   Creatinine stable. Continues to have good urine output.   Will schedule follow up appt with our office at discharge.   Lab Results  Component Value Date   CREATININE 1.69 (H) 06/04/2022   CREATININE 1.77 (H) 06/02/2022   CREATININE 1.88 (H) 06/01/2022    Intake/Output Summary (Last 24 hours) at 06/04/2022 1111 Last data filed at 06/04/2022 0500 Gross per 24 hour  Intake 540 ml  Output 701  ml  Net -161 ml    2. Anemia of chronic kidney disease Lab Results  Component Value Date   HGB 10.2 (L) 06/01/2022    Hgb remains stable.   3. Hypertension with chronic kidney disease. Home regimen includes metoprolol which is helping with rate control.   Blood pressure acceptable today     LOS: 7 [place] 2/28/202411:11 AM

## 2022-06-04 NOTE — Progress Notes (Signed)
Progress Note  Patient Name: Gavin Maxwell Date of Encounter: 06/04/2022  Primary Cardiologist: Rockey Situ  Subjective   -TEE cardioversion yesterday -EKG this morning normal sinus rhythm -Patient reports feeling slightly better than he was yesterday however still feels very fatigued and deconditioned.  Inpatient Medications    Scheduled Meds:  amiodarone  400 mg Oral BID   apixaban  5 mg Oral BID   aspirin EC  81 mg Oral Daily   atorvastatin  80 mg Oral Daily   brimonidine  1 drop Both Eyes Q12H   calcium-vitamin D  1 tablet Oral Daily   fenofibrate  160 mg Oral Daily   ferrous sulfate  325 mg Oral Daily   fluticasone  2 spray Each Nare Daily   gabapentin  100 mg Oral TID   imipramine  25 mg Oral QHS   latanoprost  1 drop Both Eyes QHS   magnesium oxide  400 mg Oral TID   metoprolol tartrate  25 mg Oral BID   mometasone-formoterol  2 puff Inhalation BID   multivitamin with minerals  1 tablet Oral Daily   pantoprazole  40 mg Oral Daily   polycarbophil  625 mg Oral BID   polyethylene glycol  17 g Oral BID   sertraline  50 mg Oral QHS   sodium chloride  1 g Oral BID WC   timolol  1 drop Both Eyes BID   Continuous Infusions:   PRN Meds: dextromethorphan-guaiFENesin, guaiFENesin-dextromethorphan, hydrALAZINE, levalbuterol, nitroGLYCERIN, ondansetron (ZOFRAN) IV, mouth rinse, phenol   Vital Signs    Vitals:   06/03/22 2316 06/04/22 0301 06/04/22 0501 06/04/22 0827  BP: 104/67 116/71  129/80  Pulse: 78 74  (!) 101  Resp: '19 16  18  '$ Temp: 97.8 F (36.6 C) 97.8 F (36.6 C)  (!) 97.5 F (36.4 C)  TempSrc:    Oral  SpO2: 99% 98%  98%  Weight:   98.1 kg   Height:        Intake/Output Summary (Last 24 hours) at 06/04/2022 0918 Last data filed at 06/04/2022 0500 Gross per 24 hour  Intake 540 ml  Output 701 ml  Net -161 ml    Filed Weights   06/02/22 0500 06/03/22 0500 06/04/22 0501  Weight: 100.2 kg 98.5 kg 98.1 kg    Telemetry    Afib/flutter with RVR  with ventricular rates in the 90s to low 100s bpm - Personally Reviewed  ECG    No new tracings - Personally Reviewed  Physical Exam   GEN: No acute distress.   Neck: 8-9 Cardiac: Tachycardic, IRIR, I/VI systolic murmur RUSB, no rubs, or gallops.  Respiratory: Diminished breath sounds along the bases bilaterally.  GI: Soft, nontender, non-distended.   MS: No edema; No deformity. Neuro:  Alert and oriented x 3; Nonfocal.  Psych: Normal affect.  Labs    Chemistry Recent Labs  Lab 05/28/22 1009 05/28/22 1207 05/29/22 0957 05/29/22 1346 05/31/22 0436 06/01/22 0500 06/02/22 0900  NA 127*   < > 131*   < > 132* 133* 134*  K 3.9   < > 4.3   < > 4.3 4.2 4.8  CL 96*   < > 98   < > 105 105 104  CO2 19*   < > 21*   < > 19* 21* 21*  GLUCOSE 121*   < > 105*   < > 112* 90 139*  BUN 82*   < > 85*   < > 67* 57* 56*  CREATININE 4.20*   < > 3.75*   < > 2.25* 1.88* 1.77*  CALCIUM 8.5*   < > 8.4*   < > 8.5* 8.6* 8.7*  PROT 6.7  --  6.3*  --   --   --   --   ALBUMIN 2.9*  --  2.6*  --   --   --   --   AST 128*  --  73*  --   --   --   --   ALT 56*  --  50*  --   --   --   --   ALKPHOS 44  --  46  --   --   --   --   BILITOT 0.8  --  0.9  --   --   --   --   GFRNONAA 14*   < > 16*   < > 29* 36* 39*  ANIONGAP 12   < > 12   < > '8 7 9   '$ < > = values in this interval not displayed.      Hematology Recent Labs  Lab 05/30/22 0630 05/31/22 0436 06/01/22 0500  WBC 10.2 9.4 8.8  RBC 3.34* 3.42* 3.26*  HGB 10.5* 10.8* 10.2*  HCT 31.5* 32.3* 30.6*  MCV 94.3 94.4 93.9  MCH 31.4 31.6 31.3  MCHC 33.3 33.4 33.3  RDW 13.4 13.6 13.4  PLT 217 230 255     Cardiac EnzymesNo results for input(s): "TROPONINI" in the last 168 hours. No results for input(s): "TROPIPOC" in the last 168 hours.   BNP Recent Labs  Lab 05/28/22 1009  BNP 215.1*      DDimer No results for input(s): "DDIMER" in the last 168 hours.   Radiology    No results found.  Cardiac Studies   2D echo  05/29/2022: 1. Left ventricular ejection fraction, by estimation, is 60 to 65%. The  left ventricle has normal function. The left ventricle has no regional  wall motion abnormalities. There is moderate concentric left ventricular  hypertrophy. Left ventricular  diastolic parameters are indeterminate.   2. Right ventricular systolic function is normal. The right ventricular  size is normal.   3. The mitral valve is normal in structure. Mild mitral valve  regurgitation. No evidence of mitral stenosis.   4. The aortic valve is normal in structure. Aortic valve regurgitation is  not visualized. Aortic valve sclerosis/calcification is present, without  any evidence of aortic stenosis.   5. The inferior vena cava is normal in size with greater than 50%  respiratory variability, suggesting right atrial pressure of 3 mmHg.   Patient Profile     77 y.o. 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  Assessment & Plan    1. Acute respiratory dystress: -Viral respiratory panel negative -Empiric ABX per IM -Agree with Xoepnex in place of albuterol given Afib/flutter -Improved  2. 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  - Start  farxiga '10mg'$  today. Labs pending - Will likely need to start losartan outpatient due to low SBPs.  - Euvolemic on exam, lasix '40mg'$  PRN.   3. Afib/flutter with RVR: -Noted on  presentation, rates have been difficult to control secondary to soft BP -Remains in Afib/flutter with RVR with ventricular rates in the 90s to 140s bpm -Relative hypotension led to the holding and ultimate discontinuation of metoprolol over the weekend with the only option for rate control being IV amiodarone -CHADS2VASc at least 4 (HTN, age x 2, vascular disease) -Eliquis 5 mg bid, does not meet reduced dosing criteria at this time -TEE cardioversion yesterday, remains in normal sinus rhythm today.  Plan to continue amiodarone 400 mg twice daily for the next 7 days and then reduce dosing to daily.  Will follow-up outpatient with repeat EKG can reduce to 200 mg daily at that point. Continue metoprolol '25mg'$  daily.   4. NSTEMI: -Cannot exclude supply demand ischemia in the setting of Afib/flutter with RVR, acute hypoxic respiratory distress, and CKD -No chest pain -Previously not a good cath candidate due to acute on CKD, renal function has been improving -Completed 48 hours of heparin  -MPI not ideal with body habitus -ASA -Lipitor -CT scan from 2022 demonstrates severe three-vessel coronary calcifications.  At this time he is having no chest pressure and reports complete resolution of dyspnea.  Will continue to monitor with plan for left heart cath outpatient.  5. Acute on CKD stage III: -Baseline SCr 1.8, presenting SCr of 4.2 -Felt to represent ATN in the setting of the above -Labs pending today.  6. Normocytic anemia: -Stable  7. Hyponatremia: -Improving  -Does not appear to be hypervolemic     Shared Decision Making/Informed Consent{  The risks [stroke, cardiac arrhythmias rarely resulting in the need for a temporary or permanent pacemaker, skin irritation or burns, esophageal damage, perforation (1:10,000 risk), bleeding, pharyngeal hematoma as well as other potential complications associated with conscious sedation including aspiration, arrhythmia, respiratory failure and  death], benefits (treatment guidance, restoration of normal sinus rhythm, diagnostic support) and alternatives of a transesophageal echocardiogram guided cardioversion were discussed in detail with Mr. Bethard and he is willing to proceed.   For questions or updates, please contact [place] Please consult [email or web address] for contact info under Cardiology/STEMI.    Signed, Camara Rosander Advanced Heart Failure 9:26 AM

## 2022-06-04 NOTE — TOC Benefit Eligibility Note (Addendum)
Patient Teacher, English as a foreign language completed.    The patient is currently admitted and upon discharge could be taking Eliquis 5 mg.  The current 30 day co-pay is $47.00.   The patient is currently admitted and upon discharge could be taking Farxiga 10 mg.  The current 30 day co-pay is $47.00.   The patient is insured through [employer], [place] Patient Advocate Specialist [place] Patient Advocate Team Direct Number: [phone]  Fax: [phone]

## 2022-06-04 NOTE — Progress Notes (Signed)
Physical Therapy Treatment Patient Details Name: Gavin Maxwell MRN: [id number] DOB: 27-Aug-1945 Today's Date: 06/04/2022   History of Present Illness Pt is a 77 y/o M admitted on 05/28/22 after presenting with c/o SOB & diarrhea. Pt is being treated for dyspnea & cough, likely viral illness, as well as NSTEMI. PMH: HTN, COPD, CKD3A, anemia, OSA on CPAP, NHL in remission, SVT    PT Comments    Pt is making good progress towards goals with ability to ambulate in hallway with close chair follow and 1 seated rest break. RW used and HR monitored throughout. Pt thrilled with improvement this date and will be able to ambulate short house hold distances. Discussed with patient about energy conservation including chairs for rest breaks. Will continue to progress as able.  Recommendations for follow up therapy are one component of a multi-disciplinary discharge planning process, led by the attending physician.  Recommendations may be updated based on patient status, additional functional criteria and insurance authorization.  Follow Up Recommendations  Home health PT     Assistance Recommended at Discharge Intermittent Supervision/Assistance  Patient can return home with the following A little help with walking and/or transfers;A little help with bathing/dressing/bathroom;Assistance with cooking/housework;Assist for transportation;Help with stairs or ramp for entrance   Equipment Recommendations  None recommended by PT    Recommendations for Other Services       Precautions / Restrictions Precautions Precautions: Fall Precaution Comments: watch HR Restrictions Weight Bearing Restrictions: No     Mobility  Bed Mobility               General bed mobility comments: not performed, pt received in recliner at beginning of session    Transfers Overall transfer level: Needs assistance Equipment used: Rolling walker (2 wheels) Transfers: Sit to/from Stand Sit to Stand: Min guard            General transfer comment: improved technique this date and no cues for pushing from seated surface. RW used once standing.    Ambulation/Gait Ambulation/Gait assistance: Min guard Gait Distance (Feet): 80 Feet Assistive device: Rolling walker (2 wheels) Gait Pattern/deviations: Step-through pattern       General Gait Details: ambulated in hallway with reciprocal gait pattern and RW used. HR monitored ranging from 98-114bpm. 1 seated rest break required 1/2 way   Stairs             Wheelchair Mobility    Modified Rankin (Stroke Patients Only)       Balance Overall balance assessment: Needs assistance Sitting-balance support: Feet supported Sitting balance-Leahy Scale: Good     Standing balance support: During functional activity, Single extremity supported Standing balance-Leahy Scale: Fair                              Cognition Arousal/Alertness: Awake/alert Behavior During Therapy: WFL for tasks assessed/performed Overall Cognitive Status: Within Functional Limits for tasks assessed                                 General Comments: pleasant and agreeable        Exercises      General Comments        Pertinent Vitals/Pain Pain Assessment Pain Assessment: No/denies pain    Home Living  Prior Function            PT Goals (current goals can now be found in the care plan section) Acute Rehab PT Goals Patient Stated Goal: get better/stronger PT Goal Formulation: With patient Time For Goal Achievement: 06/15/22 Potential to Achieve Goals: Good Progress towards PT goals: Progressing toward goals    Frequency    Min 2X/week      PT Plan Current plan remains appropriate    Co-evaluation              AM-PAC PT "6 Clicks" Mobility   Outcome Measure  Help needed turning from your back to your side while in a flat bed without using bedrails?: A Little Help needed  moving from lying on your back to sitting on the side of a flat bed without using bedrails?: A Lot Help needed moving to and from a bed to a chair (including a wheelchair)?: A Little Help needed standing up from a chair using your arms (e.g., wheelchair or bedside chair)?: A Little Help needed to walk in hospital room?: A Little Help needed climbing 3-5 steps with a railing? : A Lot 6 Click Score: 16    End of Session Equipment Utilized During Treatment: Gait belt Activity Tolerance: Patient tolerated treatment well Patient left: in chair;with nursing/sitter in room Nurse Communication: Mobility status PT Visit Diagnosis: Muscle weakness (generalized) (M62.81);Difficulty in walking, not elsewhere classified (R26.2)     Time: [id number] PT Time Calculation (min) (ACUTE ONLY): 23 min  Charges:  $Gait Training: 23-37 mins                     Greggory Stallion, PT, DPT, GCS [phone]    Gavin Maxwell 06/04/2022, 11:01 AM

## 2022-06-04 NOTE — TOC Transition Note (Signed)
Transition of Care [facility]) - CM/SW Discharge Note   Patient Details  Name: Gavin Maxwell MRN: [id number] Date of Birth: [birth year]  [facility] CM/SW Contact:  Laurena Slimmer, RN Phone Number: 06/04/2022, 2:44 PM   Clinical Narrative:    Discharge order received.  Patient advised his HH was arranged with [facility] and they would reach out to him.  Patient's wife will transport him home.   TOC signing off.       Barriers to Discharge: Continued Medical Work up   Patient Goals and CMS Choice      Discharge Placement                         Discharge Plan and Services Additional resources added to the After Visit Summary for       Post Acute Care Choice: NA                               Social Determinants of Health (SDOH) Interventions SDOH Screenings   Food Insecurity: No Food Insecurity (05/28/2022)  Housing: Low Risk  (05/28/2022)  Transportation Needs: No Transportation Needs (05/28/2022)  Utilities: Not At Risk (05/28/2022)  Tobacco Use: Medium Risk (06/04/2022)     Readmission Risk Interventions    05/29/2022   11:22 AM  Readmission Risk Prevention Plan  Transportation Screening Complete  PCP or Specialist Appt within 3-5 Days Complete  Social Work Consult for [place] Planning/Counseling Complete  Palliative Care Screening Not Applicable  Medication Review Press photographer) Complete

## 2022-06-04 NOTE — Hospital Course (Addendum)
77 y.o.. w/ HTN, COPD not on oxygen at baseline, CKD 3A, anemia, OSA on CPAP, NHL in remission, SVT, who presented with shortness breath and diarrhea. Patient states that has SOB for about 5 day, which has been progressively worsening.  Patient was seen by PCP on 2/19, and diagnosed with COPD exacerbation.  Patient given prescription for doxycycline and prednisone, without significant improvement.  Chest x-ray new bilateral rule effusion pulmonary bilateral interstitial marking could represent venous congestion.  Ultrasound renal signs of medical renal disease question of anasarca Patient was admitted for A-fib with RVR new onset seen by cardiology status post TEE/cardioversion 2/27> reverted to this rhythm Meds were adjusted including transition of heparin to Eliquis.  Patient felt to be not a good candidate for cardiac cath given renal failure managed medically for elevated troponin concern for NSTEMI Underwent cardioversion not doing well in normal sinus rhythm> seen by cardiology and nephrology this morning okay for discharge home although had mild hyperkalemia losartan has been discontinued needs outpatient follow-up.  He did well with PT OT ambulated outside.

## 2022-06-04 NOTE — Discharge Summary (Signed)
Physician Discharge Summary  Gavin Maxwell [id number] DOB: January 15, 1946 DOA: 05/28/2022  PCP: Idelle Crouch, MD  Admit date: 05/28/2022 Discharge date: 06/04/2022 Recommendations for Outpatient Follow-up:  Follow up with PCP in 1 weeks-call for appointment Please obtain BMP/CBC in one week  Discharge Dispo: Home /w [facility] Discharge Condition: Stable Code Status:   Code Status: Full Code Diet recommendation:  Diet Order             Diet regular Room service appropriate? Yes; Fluid consistency: Thin; Fluid restriction: 1200 mL Fluid  Diet effective now                    Brief/Interim Summary: 77 y.o.. w/ HTN, COPD not on oxygen at baseline, CKD 3A, anemia, OSA on CPAP, NHL in remission, SVT, who presented with shortness breath and diarrhea. Patient states that has SOB for about 5 day, which has been progressively worsening.  Patient was seen by PCP on 2/19, and diagnosed with COPD exacerbation.  Patient given prescription for doxycycline and prednisone, without significant improvement.  Chest x-ray new bilateral rule effusion pulmonary bilateral interstitial marking could represent venous congestion.  Ultrasound renal signs of medical renal disease question of anasarca Patient was admitted for A-fib with RVR new onset seen by cardiology status post TEE/cardioversion 2/27> reverted to this rhythm Meds were adjusted including transition of heparin to Eliquis.  Patient felt to be not a good candidate for cardiac cath given renal failure managed medically for elevated troponin concern for NSTEMI Underwent cardioversion not doing well in normal sinus rhythm> seen by cardiology and nephrology this morning okay for discharge home although had mild hyperkalemia losartan has been discontinued needs outpatient follow-up.  He did well with PT OT ambulated outside.   Discharge Diagnoses:  Principal Problem:   NSTEMI (non-ST elevated myocardial infarction) ([place]) Active Problems:   Acute  diastolic congestive heart failure ([place])   COPD with acute exacerbation (HCC)   Atrial fibrillation with rapid ventricular response (HCC)   Essential hypertension   Hyperlipidemia   Acute renal failure superimposed on chronic kidney disease (HCC)   Diarrhea   Abnormal LFTs   Hyponatremia   Depression with anxiety   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HCC)   Iron deficiency anemia   Obesity with body mass index of 30.0-39.9    Atrial fibrillation with RVR ([place]): New onset : Underwent cardioversion currently in sinus rhythm.  CHADS2VASc at least 4 (HTN, age x 2, vascular disease).  Continue Eliquis 5 mg bid, does not meet reduced dosing criteria at this time, Plan to continue amiodarone 400 mg twice daily for the next 7 days and then reduce dosing to daily.  Will follow-up outpatient with repeat EKG can reduce to 200 mg daily at that point. Continue metoprolol '25mg'$  daily  NSTEMI: In the setting of respiratory failure, viral syndrome, atrial fibrillation with RVR, renal failure ,Troponin 2100, trending downward  Normal LV function with no regional wall motion abnormality on echo , initially managed with heparin seen by cardiology no chest pain previously noted good cardiac cath candidate due to acute on CKD renal function has been improving completed 48 hours of heparin continue aspirin Lipitor, continue to monitor with plan for left heart cath outpatient.  Dyspnea and cough:Acute respiratory failure ruled out -likely viral illness.  Resp panel neg, RVP neg. doing well on room air. AKI on CKD stage IIIA: Baseline creatinine around 1.8 AKI likely hemodynamic instability with NSAID use/NSTEMI.  Cleared by nephrology for discharge they will  schedule outpatient follow-up soon Mild hyperkalemia okay to continue [place] which should help with potassium discontinue losartan.  Follow-up closely Anemia of chronic kidney disease hemoglobin stable monitor Hypertension BP stable continue current metoprolol.  COPD with  acute exacerbation no wheezing currently he took prescribed prednisone and doxycycline, bronchodilators Hyperlipidemia continue Lipitor and fenofibrate's Diarrhea negative C. difficile and GI pathogen panel Mildly elevated LFTs abnormal-unclear etiology outpatient follow-up advised Hyponatremia sodium improved Anxiety/depression continue provide sertraline mood is stable NHL on remission follow-up with hematology as outpatient Class I obesity with BMI 30.1 advise outpatient follow-up weight loss   Consults: CARDIO, NEPHRO Subjective: Alert oriented resting comfortably ambulating with physical therapy, HE FEELS READY for discharge home.  Discharge Exam: Vitals:   06/04/22 0827 06/04/22 1146  BP: 129/80 114/72  Pulse: (!) 101 91  Resp: 18 18  Temp: (!) 97.5 F (36.4 C) (!) 97.5 F (36.4 C)  SpO2: 98% 100%   General: Pt is alert, awake, not in acute distress Cardiovascular: RRR, S1/S2 +, no rubs, no gallops Respiratory: CTA bilaterally, no wheezing, no rhonchi Abdominal: Soft, NT, ND, bowel sounds + Extremities: no edema, no cyanosis  Discharge Instructions  Discharge Instructions     Amb Referral to Cardiac Rehabilitation   Complete by: As directed    Diagnosis: NSTEMI   After initial evaluation and assessments completed: Virtual Based Care may be provided alone or in conjunction with Phase 2 Cardiac Rehab based on patient barriers.: Yes   Intensive Cardiac Rehabilitation (ICR) [place] location only OR Traditional Cardiac Rehabilitation (TCR) *If criteria for ICR are not met will enroll in [facility] only): Yes   Discharge instructions   Complete by: As directed    Follow-up with nephrology within few days to check your renal function and potassium level  Please call call MD or return to ER for similar or worsening recurring problem that brought you to hospital or if any fever,nausea/vomiting,abdominal pain, uncontrolled pain, chest pain,  shortness of breath or any other alarming  symptoms.  Please follow-up your doctor as instructed in a week time and call the office for appointment.  Please avoid alcohol, smoking, or any other illicit substance and maintain healthy habits including taking your regular medications as prescribed.  You were cared for by a hospitalist during your hospital stay. If you have any questions about your discharge medications or the care you received while you were in the hospital after you are discharged, you can call the unit and ask to speak with the hospitalist on call if the hospitalist that took care of you is not available.  Once you are discharged, your primary care physician will handle any further medical issues. Please note that NO REFILLS for any discharge medications will be authorized once you are discharged, as it is imperative that you return to your primary care physician (or establish a relationship with a primary care physician if you do not have one) for your aftercare needs so that they can reassess your need for medications and monitor your lab values   Increase activity slowly   Complete by: As directed       Allergies as of 06/04/2022       Reactions   Augmentin [amoxicillin-pot Clavulanate] Nausea Only   Did it involve swelling of the face/tongue/throat, SOB, or low BP? No Did it involve sudden or severe rash/hives, skin peeling, or any reaction on the inside of your mouth or nose? No Did you need to seek medical attention at a hospital  or doctor's office? No When did it last happen? More than 10 years If all above answers are "NO", may proceed with cephalosporin use.   Celebrex [celecoxib] Nausea Only   Oxycodone Other (See Comments)   Confusion, makes him "crazy"        Medication List     STOP taking these medications    doxycycline 100 MG capsule Commonly known as: VIBRAMYCIN   HYDROcodone-acetaminophen 5-325 MG tablet Commonly known as: NORCO/VICODIN   meloxicam 15 MG tablet Commonly known as:  MOBIC   methocarbamol 500 MG tablet Commonly known as: ROBAXIN   predniSONE 10 MG tablet Commonly known as: DELTASONE   Symbicort 80-4.5 MCG/ACT inhaler Generic drug: budesonide-formoterol       TAKE these medications    acetaminophen 500 MG tablet Commonly known as: TYLENOL Take 1,000 mg by mouth 2 (two) times daily as needed for moderate pain.   albuterol 108 (90 Base) MCG/ACT inhaler Commonly known as: VENTOLIN HFA Inhale 2 puffs into the lungs every 6 (six) hours as needed for wheezing or shortness of breath.   amiodarone 400 MG tablet Commonly known as: PACERONE Take 400 mg bid x 7 days then once a day until further instruction   apixaban 5 MG Tabs tablet Commonly known as: ELIQUIS Take 1 tablet (5 mg total) by mouth 2 (two) times daily.   aspirin EC 81 MG tablet Take 1 tablet (81 mg total) by mouth daily. Swallow whole.   atorvastatin 80 MG tablet Commonly known as: LIPITOR Take 1 tablet (80 mg total) by mouth daily.   azithromycin 250 MG tablet Commonly known as: ZITHROMAX Take 250 mg by mouth daily.   brimonidine-timolol 0.2-0.5 % ophthalmic solution Commonly known as: COMBIGAN Place 1 drop into both eyes every 12 (twelve) hours.   CALCIUM 600 + D PO Take 1 tablet by mouth daily.   dapagliflozin propanediol 10 MG Tabs tablet Commonly known as: FARXIGA Take 1 tablet (10 mg total) by mouth daily. Start taking on: June 05, 2022   fenofibrate 160 MG tablet Take 160 mg by mouth daily.   ferrous sulfate 325 (65 FE) MG tablet Take 325 mg by mouth daily.   fluticasone 50 MCG/ACT nasal spray Commonly known as: FLONASE Place 2 sprays into both nostrils daily.   fluticasone-salmeterol 100-50 MCG/ACT Aepb Commonly known as: ADVAIR Inhale 1 puff into the lungs 2 (two) times daily.   gabapentin 300 MG capsule Commonly known as: NEURONTIN Take 300 mg by mouth 3 (three) times daily.   imipramine 25 MG tablet Commonly known as: TOFRANIL Take 25 mg  by mouth at bedtime.   Lumigan 0.01 % Soln Generic drug: bimatoprost Place 1 drop into both eyes at bedtime.   magnesium oxide 400 MG tablet Commonly known as: MAG-OX Take 400 mg by mouth 3 (three) times daily.   metoprolol succinate 25 MG 24 hr tablet Commonly known as: TOPROL-XL Take 1 tablet (25 mg total) by mouth every evening.   multivitamin with minerals Tabs tablet Take 1 tablet by mouth daily.   pantoprazole 40 MG tablet Commonly known as: PROTONIX Take 40 mg by mouth daily.   polycarbophil 625 MG tablet Commonly known as: FIBERCON Take 625 mg by mouth 2 (two) times daily.   sertraline 50 MG tablet Commonly known as: ZOLOFT Take 50 mg by mouth at bedtime.        Follow-up Information     Idelle Crouch, MD Follow up in 1 week(s).   Specialty: Internal Medicine  Contact information: [facility] [place] [address] [phone]         Lavonia Dana, MD Follow up in 3 day(s).   Specialty: Nephrology Contact information: [address] [place] [address] [phone]                Allergies  Allergen Reactions   Augmentin [Amoxicillin-Pot Clavulanate] Nausea Only    Did it involve swelling of the face/tongue/throat, SOB, or low BP? No Did it involve sudden or severe rash/hives, skin peeling, or any reaction on the inside of your mouth or nose? No Did you need to seek medical attention at a hospital or doctor's office? No When did it last happen? More than 10 years If all above answers are "NO", may proceed with cephalosporin use.    Celebrex [Celecoxib] Nausea Only   Oxycodone Other (See Comments)    Confusion, makes him "crazy"    The results of significant diagnostics from this hospitalization (including imaging, microbiology, ancillary and laboratory) are listed below for reference.    Microbiology: Recent Results (from the past 240 hour(s))  Resp panel by RT-PCR (RSV, Flu A&B,  Covid)     Status: None   Collection Time: 05/29/22  9:54 AM  Result Value Ref Range Status   SARS Coronavirus 2 by RT PCR NEGATIVE NEGATIVE Final    Comment: (NOTE) SARS-CoV-2 target nucleic acids are NOT DETECTED.  The SARS-CoV-2 RNA is generally detectable in upper respiratory specimens during the acute phase of infection. The lowest concentration of SARS-CoV-2 viral copies this assay can detect is 138 copies/mL. A negative result does not preclude SARS-Cov-2 infection and should not be used as the sole basis for treatment or other patient management decisions. A negative result may occur with  improper specimen collection/handling, submission of specimen other than nasopharyngeal swab, presence of viral mutation(s) within the areas targeted by this assay, and inadequate number of viral copies(<138 copies/mL). A negative result must be combined with clinical observations, patient history, and epidemiological information. The expected result is Negative.  Fact Sheet for Patients:  EntrepreneurPulse.com.au  Fact Sheet for Healthcare Providers:  IncredibleEmployment.be  This test is no t yet approved or cleared by the [place] FDA and  has been authorized for detection and/or diagnosis of SARS-CoV-2 by FDA under an Emergency Use Authorization (EUA). This EUA will remain  in effect (meaning this test can be used) for the duration of the COVID-19 declaration under Section 564(b)(1) of the Act, 21 U.S.C.section 360bbb-3(b)(1), unless the authorization is terminated  or revoked sooner.       Influenza A by PCR NEGATIVE NEGATIVE Final   Influenza B by PCR NEGATIVE NEGATIVE Final    Comment: (NOTE) The Xpert Xpress SARS-CoV-2/FLU/RSV plus assay is intended as an aid in the diagnosis of influenza from Nasopharyngeal swab specimens and should not be used as a sole basis for treatment. Nasal washings and aspirates are unacceptable for Xpert  Xpress SARS-CoV-2/FLU/RSV testing.  Fact Sheet for Patients: EntrepreneurPulse.com.au  Fact Sheet for Healthcare Providers: IncredibleEmployment.be  This test is not yet approved or cleared by the [place] FDA and has been authorized for detection and/or diagnosis of SARS-CoV-2 by FDA under an Emergency Use Authorization (EUA). This EUA will remain in effect (meaning this test can be used) for the duration of the COVID-19 declaration under Section 564(b)(1) of the Act, 21 U.S.C. section 360bbb-3(b)(1), unless the authorization is terminated or revoked.     Resp Syncytial Virus  by PCR NEGATIVE NEGATIVE Final    Comment: (NOTE) Fact Sheet for Patients: EntrepreneurPulse.com.au  Fact Sheet for Healthcare Providers: IncredibleEmployment.be  This test is not yet approved or cleared by the [place] FDA and has been authorized for detection and/or diagnosis of SARS-CoV-2 by FDA under an Emergency Use Authorization (EUA). This EUA will remain in effect (meaning this test can be used) for the duration of the COVID-19 declaration under Section 564(b)(1) of the Act, 21 U.S.C. section 360bbb-3(b)(1), unless the authorization is terminated or revoked.  Performed at [facility], [place], [place] [address]   Respiratory (~20 pathogens) panel by PCR     Status: None   Collection Time: 05/29/22  5:16 PM   Specimen: Nasopharyngeal Swab; Respiratory  Result Value Ref Range Status   Adenovirus NOT DETECTED NOT DETECTED Final   Coronavirus 229E NOT DETECTED NOT DETECTED Final    Comment: (NOTE) The Coronavirus on the Respiratory Panel, DOES NOT test for the novel  Coronavirus (2019 nCoV)    Coronavirus HKU1 NOT DETECTED NOT DETECTED Final   Coronavirus NL63 NOT DETECTED NOT DETECTED Final   Coronavirus OC43 NOT DETECTED NOT DETECTED Final   Metapneumovirus NOT DETECTED NOT DETECTED  Final   Rhinovirus / Enterovirus NOT DETECTED NOT DETECTED Final   Influenza A NOT DETECTED NOT DETECTED Final   Influenza B NOT DETECTED NOT DETECTED Final   Parainfluenza Virus 1 NOT DETECTED NOT DETECTED Final   Parainfluenza Virus 2 NOT DETECTED NOT DETECTED Final   Parainfluenza Virus 3 NOT DETECTED NOT DETECTED Final   Parainfluenza Virus 4 NOT DETECTED NOT DETECTED Final   Respiratory Syncytial Virus NOT DETECTED NOT DETECTED Final   Bordetella pertussis NOT DETECTED NOT DETECTED Final   Bordetella Parapertussis NOT DETECTED NOT DETECTED Final   Chlamydophila pneumoniae NOT DETECTED NOT DETECTED Final   Mycoplasma pneumoniae NOT DETECTED NOT DETECTED Final    Comment: Performed at [facility], [place] [place], [place] [address]  C Difficile Quick Screen w PCR reflex     Status: None   Collection Time: 05/30/22 12:05 AM   Specimen: STOOL  Result Value Ref Range Status   C Diff antigen NEGATIVE NEGATIVE Final   C Diff toxin NEGATIVE NEGATIVE Final   C Diff interpretation No C. difficile detected.  Final    Comment: Performed at [facility], [address] [place], [place] [address]  Gastrointestinal Panel by PCR , Stool     Status: None   Collection Time: 05/30/22 12:05 AM   Specimen: Stool  Result Value Ref Range Status   Campylobacter species NOT DETECTED NOT DETECTED Final   Plesimonas shigelloides NOT DETECTED NOT DETECTED Final   Salmonella species NOT DETECTED NOT DETECTED Final   Yersinia enterocolitica NOT DETECTED NOT DETECTED Final   Vibrio species NOT DETECTED NOT DETECTED Final   Vibrio cholerae NOT DETECTED NOT DETECTED Final   Enteroaggregative E coli (EAEC) NOT DETECTED NOT DETECTED Final   Enteropathogenic E coli (EPEC) NOT DETECTED NOT DETECTED Final   Enterotoxigenic E coli (ETEC) NOT DETECTED NOT DETECTED Final   Shiga like toxin producing E coli (STEC) NOT DETECTED NOT DETECTED Final   Shigella/Enteroinvasive E coli (EIEC) NOT  DETECTED NOT DETECTED Final   Cryptosporidium NOT DETECTED NOT DETECTED Final   Cyclospora cayetanensis NOT DETECTED NOT DETECTED Final   Entamoeba histolytica NOT DETECTED NOT DETECTED Final   Giardia lamblia NOT DETECTED NOT DETECTED Final   Adenovirus F40/41 NOT  DETECTED NOT DETECTED Final   Astrovirus NOT DETECTED NOT DETECTED Final   Norovirus GI/GII NOT DETECTED NOT DETECTED Final   Rotavirus A NOT DETECTED NOT DETECTED Final   Sapovirus (I, II, IV, and V) NOT DETECTED NOT DETECTED Final    Comment: Performed at [facility], [address] [place], [place] [address]    Procedures/Studies: ECHOCARDIOGRAM COMPLETE  Result Date: 05/29/2022    ECHOCARDIOGRAM REPORT   Patient Name:   Gavin Maxwell Date of Exam: 05/29/2022 Medical Rec #:  [id number]       Height:       71.0 in Accession #:    [id number]      Weight:       223.8 lb Date of Birth:  1945-12-01       BSA:          2.212 m Patient Age:    77 years        BP:           122/59 mmHg Patient Gender: M               HR:           91 bpm. Exam Location:  [facility] Procedure: 2D Echo, Cardiac Doppler and Color Doppler Indications:     CHF---acute diastolic [id number]  History:         Patient has no prior history of Echocardiogram examinations.                  COPD; Risk Factors:Hypertension and Sleep Apnea.  Sonographer:     Sherrie Sport Referring Phys:  [place] Diagnosing Phys: [place]  1. Left ventricular ejection fraction, by estimation, is 60 to 65%. The left ventricle has normal function. The left ventricle has no regional wall motion abnormalities. There is moderate concentric left ventricular hypertrophy. Left ventricular diastolic parameters are indeterminate.  2. Right ventricular systolic function is normal. The right ventricular size is normal.  3. The mitral valve is normal in structure. Mild mitral valve regurgitation. No evidence of mitral stenosis.  4. The aortic valve is normal in structure. Aortic valve  regurgitation is not visualized. Aortic valve sclerosis/calcification is present, without any evidence of aortic stenosis.  5. The inferior vena cava is normal in size with greater than 50% respiratory variability, suggesting right atrial pressure of 3 mmHg. FINDINGS  Left Ventricle: Left ventricular ejection fraction, by estimation, is 60 to 65%. The left ventricle has normal function. The left ventricle has no regional wall motion abnormalities. The left ventricular internal cavity size was normal in size. There is  moderate concentric left ventricular hypertrophy. Left ventricular diastolic parameters are indeterminate. Right Ventricle: The right ventricular size is normal. No increase in right ventricular wall thickness. Right ventricular systolic function is normal. Left Atrium: Left atrial size was normal in size. Right Atrium: Right atrial size was normal in size. Pericardium: There is no evidence of pericardial effusion. Mitral Valve: The mitral valve is normal in structure. Mild mitral valve regurgitation. No evidence of mitral valve stenosis. Tricuspid Valve: The tricuspid valve is normal in structure. Tricuspid valve regurgitation is trivial. No evidence of tricuspid stenosis. Aortic Valve: The aortic valve is normal in structure. Aortic valve regurgitation is not visualized. Aortic valve sclerosis/calcification is present, without any evidence of aortic stenosis. Aortic valve mean gradient measures 2.0 mmHg. Aortic valve peak  gradient measures 3.5 mmHg. Aortic valve area, by VTI measures 2.92 cm. Pulmonic Valve: The pulmonic  valve was normal in structure. Pulmonic valve regurgitation is not visualized. No evidence of pulmonic stenosis. Aorta: The aortic root is normal in size and structure. Venous: The inferior vena cava is normal in size with greater than 50% respiratory variability, suggesting right atrial pressure of 3 mmHg. IAS/Shunts: No atrial level shunt detected by color flow Doppler.  LEFT  VENTRICLE PLAX 2D LVIDd:         4.50 cm LVIDs:         3.60 cm LV PW:         0.80 cm LV IVS:        1.20 cm LVOT diam:     2.20 cm LV SV:         49 LV SV Index:   22 LVOT Area:     3.80 cm  RIGHT VENTRICLE RV Basal diam:  3.20 cm RV Mid diam:    3.00 cm RV S prime:     11.60 cm/s TAPSE (M-mode): 2.2 cm LEFT ATRIUM             Index        RIGHT ATRIUM           Index LA diam:        3.00 cm 1.36 cm/m   RA Area:     16.10 cm LA Vol (A2C):   40.4 ml 18.27 ml/m  RA Volume:   36.70 ml  16.59 ml/m LA Vol (A4C):   51.5 ml 23.29 ml/m LA Biplane Vol: 48.1 ml 21.75 ml/m  AORTIC VALVE AV Area (Vmax):    3.15 cm AV Area (Vmean):   2.91 cm AV Area (VTI):     2.92 cm AV Vmax:           93.30 cm/s AV Vmean:          67.900 cm/s AV VTI:            0.168 m AV Peak Grad:      3.5 mmHg AV Mean Grad:      2.0 mmHg LVOT Vmax:         77.40 cm/s LVOT Vmean:        51.900 cm/s LVOT VTI:          0.129 m LVOT/AV VTI ratio: 0.77  AORTA Ao Root diam: 3.90 cm MITRAL VALVE               TRICUSPID VALVE MV Area (PHT): 4.49 cm    TR Peak grad:   19.4 mmHg MV Decel Time: 169 msec    TR Vmax:        220.00 cm/s MV E velocity: 85.40 cm/s                            SHUNTS                            Systemic VTI:  0.13 m                            Systemic Diam: 2.20 cm Neoma Laming Electronically signed by Neoma Laming Signature Date/Time: 05/29/2022/9:48:56 AM    Final    US RENAL  Result Date: 05/28/2022 CLINICAL DATA:  Acute kidney injury in a 77 year old male. EXAM: RENAL / URINARY TRACT ULTRASOUND COMPLETE COMPARISON:  CT imaging from January 2021. FINDINGS: Right Kidney:  Renal measurements: 8.5 x 5.1 x 5.2 cm = volume: 119 mL. Small cyst in the RIGHT kidney measuring 1.7 x 1.3 x 1.2 cm. Mild parenchymal scarring of the RIGHT kidney. No hydronephrosis. Increased cortical echogenicity on the RIGHT. Left Kidney: Renal measurements: 9.8 x 5.2 x 5.5 cm = volume: 146 mL. Cysts in the RIGHT kidney largest 4.7 x 3.7 x 3.3 cm arising  from the upper pole. No hydronephrosis. Mild increased cortical echogenicity. Trace perinephric fluid or stranding. Bladder: Appears normal for degree of bladder distention. Other: Increased echogenicity is subcutaneous and retroperitoneal fat could be seen in the setting of diffuse edema. IMPRESSION: Signs of medical renal disease and renal cysts.  No hydronephrosis. Question of anasarca. Trace perinephric fluid suggested on the LEFT is nonfocal in appearance and can be seen in the setting of anasarca, renal dysfunction or infection. Correlation with urinalysis may also be helpful. Electronically Signed   By: Zetta Bills M.D.   On: 05/28/2022 14:06   DG Chest 1 View  Result Date: 05/28/2022 CLINICAL DATA:  Shortness of breath EXAM: CHEST  1 VIEW COMPARISON:  CXR 05/26/22 FINDINGS: New bilateral pleural effusions. No pneumothorax. Unchanged cardiac and mediastinal contours. No focal airspace opacity. There are prominent bilateral interstitial markings, which could represent pulmonary venous congestion. No radiographically apparent displaced rib fracture. Unchanged appearance of the bilateral glenohumeral joints, only partially imaged on the right. Visualized upper abdomen is unremarkable. IMPRESSION: 1. New bilateral pleural effusions. 2. Prominent bilateral interstitial markings, which could represent pulmonary venous congestion. 3. No focal airspace opacity. Electronically Signed   By: Marin Roberts M.D.   On: 05/28/2022 10:57   DG Chest 2 View  Result Date: 05/26/2022 CLINICAL DATA:  Shortness of breath EXAM: CHEST - 2 VIEW COMPARISON:  CT 06/01/2020. FINDINGS: Calcified aorta. Normal cardiopericardial silhouette. Apical pleural thickening. No consolidation, pneumothorax or effusion. Interstitial prominence again seen which is likely chronic. 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 Please correlate with clinical history IMPRESSION: CHRONIC  CHANGES.  NO ACUTE CARDIOPULMONARY DISEASE.: IMPRESSION: CHRONIC CHANGES.  NO ACUTE CARDIOPULMONARY DISEASE. 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 Please correlate with any known history or dedicated shoulder x-ray when appropriate Electronically Signed   By: Jill Side M.D.   On: 05/26/2022 11:29    Labs: BNP (last 3 results) Recent Labs    05/28/22 1009  BNP [id number]*   Basic Metabolic Panel: Recent Labs  Lab 05/29/22 0618 05/29/22 0957 05/30/22 0630 05/31/22 0436 06/01/22 0500 06/02/22 0900 06/04/22 1031  NA 130*   < > 132* 132* 133* 134* 132*  K 4.1   < > 4.0 4.3 4.2 4.8 5.3*  CL 99   < > 103 105 105 104 102  CO2 20*   < > 20* 19* 21* 21* 22  GLUCOSE 98   < > 106* 112* 90 139* 108*  BUN 85*   < > 74* 67* 57* 56* 47*  CREATININE 3.71*   < > 2.79* 2.25* 1.88* 1.77* 1.69*  CALCIUM 8.1*   < > 8.2* 8.5* 8.6* 8.7* 8.7*  MG 1.8  --  1.9 1.8 1.6*  --  1.9  PHOS  --   --  3.0  --   --   --   --    < > = values in this interval not displayed.   Liver Function Tests: Recent Labs  Lab 05/29/22 0957  AST 73*  ALT 50*  ALKPHOS 46  BILITOT 0.9  PROT 6.3*  ALBUMIN 2.6*   No results for input(s): "LIPASE", "AMYLASE" in the last 168 hours. No results for input(s): "AMMONIA" in the last 168 hours. CBC: Recent Labs  Lab 05/29/22 0618 05/30/22 0630 05/31/22 0436 06/01/22 0500  WBC 10.3 10.2 9.4 8.8  HGB 10.3* 10.5* 10.8* 10.2*  HCT 30.1* 31.5* 32.3* 30.6*  MCV 92.6 94.3 94.4 93.9  PLT 220 217 230 255   Cardiac Enzymes: No results for input(s): "CKTOTAL", "CKMB", "CKMBINDEX", "TROPONINI" in the last 168 hours. BNP: Invalid input(s): "POCBNP" CBG: No results for input(s): "GLUCAP" in the last 168 hours. D-Dimer No results for input(s): "DDIMER" in the last 72 hours. Hgb A1c No results for input(s): "HGBA1C" in the last 72 hours. Lipid Profile No results for input(s): "CHOL", "HDL", "LDLCALC", "TRIG", "CHOLHDL", "LDLDIRECT" in the  last 72 hours. Thyroid function studies No results for input(s): "TSH", "T4TOTAL", "T3FREE", "THYROIDAB" in the last 72 hours.  Invalid input(s): "FREET3" Anemia work up No results for input(s): "VITAMINB12", "FOLATE", "FERRITIN", "TIBC", "IRON", "RETICCTPCT" in the last 72 hours. Urinalysis    Component Value Date/Time   COLORURINE YELLOW (A) 05/28/2022 1518   APPEARANCEUR HAZY (A) 05/28/2022 1518   APPEARANCEUR Clear 09/19/2019 1122   LABSPEC 1.018 05/28/2022 1518   PHURINE 5.0 05/28/2022 1518   GLUCOSEU NEGATIVE 05/28/2022 1518   HGBUR MODERATE (A) 05/28/2022 1518   BILIRUBINUR NEGATIVE 05/28/2022 1518   BILIRUBINUR Negative 09/19/2019 1122   KETONESUR NEGATIVE 05/28/2022 1518   PROTEINUR 30 (A) 05/28/2022 1518   NITRITE NEGATIVE 05/28/2022 1518   LEUKOCYTESUR NEGATIVE 05/28/2022 1518   Sepsis Labs Recent Labs  Lab 05/29/22 0618 05/30/22 0630 05/31/22 0436 06/01/22 0500  WBC 10.3 10.2 9.4 8.8   Microbiology Recent Results (from the past 240 hour(s))  Resp panel by RT-PCR (RSV, Flu A&B, Covid)     Status: None   Collection Time: 05/29/22  9:54 AM  Result Value Ref Range Status   SARS Coronavirus 2 by RT PCR NEGATIVE NEGATIVE Final    Comment: (NOTE) SARS-CoV-2 target nucleic acids are NOT DETECTED.  The SARS-CoV-2 RNA is generally detectable in upper respiratory specimens during the acute phase of infection. The lowest concentration of SARS-CoV-2 viral copies this assay can detect is 138 copies/mL. A negative result does not preclude SARS-Cov-2 infection and should not be used as the sole basis for treatment or other patient management decisions. A negative result may occur with  improper specimen collection/handling, submission of specimen other than nasopharyngeal swab, presence of viral mutation(s) within the areas targeted by this assay, and inadequate number of viral copies(<138 copies/mL). A negative result must be combined with clinical observations,  patient history, and epidemiological information. The expected result is Negative.  Fact Sheet for Patients:  EntrepreneurPulse.com.au  Fact Sheet for Healthcare Providers:  IncredibleEmployment.be  This test is no t yet approved or cleared by the [place] FDA and  has been authorized for detection and/or diagnosis of SARS-CoV-2 by FDA under an Emergency Use Authorization (EUA). This EUA will remain  in effect (meaning this test can be used) for the duration of the COVID-19 declaration under Section 564(b)(1) of the Act, 21 U.S.C.section 360bbb-3(b)(1), unless the authorization is terminated  or revoked sooner.       Influenza A by PCR NEGATIVE NEGATIVE Final   Influenza B by PCR NEGATIVE NEGATIVE Final    Comment: (NOTE) The Xpert Xpress SARS-CoV-2/FLU/RSV plus assay is intended as an aid in the diagnosis of  influenza from Nasopharyngeal swab specimens and should not be used as a sole basis for treatment. Nasal washings and aspirates are unacceptable for Xpert Xpress SARS-CoV-2/FLU/RSV testing.  Fact Sheet for Patients: EntrepreneurPulse.com.au  Fact Sheet for Healthcare Providers: IncredibleEmployment.be  This test is not yet approved or cleared by the [place] FDA and has been authorized for detection and/or diagnosis of SARS-CoV-2 by FDA under an Emergency Use Authorization (EUA). This EUA will remain in effect (meaning this test can be used) for the duration of the COVID-19 declaration under Section 564(b)(1) of the Act, 21 U.S.C. section 360bbb-3(b)(1), unless the authorization is terminated or revoked.     Resp Syncytial Virus by PCR NEGATIVE NEGATIVE Final    Comment: (NOTE) Fact Sheet for Patients: EntrepreneurPulse.com.au  Fact Sheet for Healthcare Providers: IncredibleEmployment.be  This test is not yet approved or cleared by the [place] FDA and has been authorized for detection and/or diagnosis of SARS-CoV-2 by FDA under an Emergency Use Authorization (EUA). This EUA will remain in effect (meaning this test can be used) for the duration of the COVID-19 declaration under Section 564(b)(1) of the Act, 21 U.S.C. section 360bbb-3(b)(1), unless the authorization is terminated or revoked.  Performed at [facility], [place], [place] [address]   Respiratory (~20 pathogens) panel by PCR     Status: None   Collection Time: 05/29/22  5:16 PM   Specimen: Nasopharyngeal Swab; Respiratory  Result Value Ref Range Status   Adenovirus NOT DETECTED NOT DETECTED Final   Coronavirus 229E NOT DETECTED NOT DETECTED Final    Comment: (NOTE) The Coronavirus on the Respiratory Panel, DOES NOT test for the novel  Coronavirus (2019 nCoV)    Coronavirus HKU1 NOT DETECTED NOT DETECTED Final   Coronavirus NL63 NOT DETECTED NOT DETECTED Final   Coronavirus OC43 NOT DETECTED NOT DETECTED Final   Metapneumovirus NOT DETECTED NOT DETECTED Final   Rhinovirus / Enterovirus NOT DETECTED NOT DETECTED Final   Influenza A NOT DETECTED NOT DETECTED Final   Influenza B NOT DETECTED NOT DETECTED Final   Parainfluenza Virus 1 NOT DETECTED NOT DETECTED Final   Parainfluenza Virus 2 NOT DETECTED NOT DETECTED Final   Parainfluenza Virus 3 NOT DETECTED NOT DETECTED Final   Parainfluenza Virus 4 NOT DETECTED NOT DETECTED Final   Respiratory Syncytial Virus NOT DETECTED NOT DETECTED Final   Bordetella pertussis NOT DETECTED NOT DETECTED Final   Bordetella Parapertussis NOT DETECTED NOT DETECTED Final   Chlamydophila pneumoniae NOT DETECTED NOT DETECTED Final   Mycoplasma pneumoniae NOT DETECTED NOT DETECTED Final    Comment: Performed at [facility], [place]. [address] [place], [place] [address]  C Difficile Quick Screen w PCR reflex     Status: None   Collection Time: 05/30/22 12:05 AM   Specimen: STOOL  Result Value  Ref Range Status   C Diff antigen NEGATIVE NEGATIVE Final   C Diff toxin NEGATIVE NEGATIVE Final   C Diff interpretation No C. difficile detected.  Final    Comment: Performed at [facility], [address] [place], [place] [address]  Gastrointestinal Panel by PCR , Stool     Status: None   Collection Time: 05/30/22 12:05 AM   Specimen: Stool  Result Value Ref Range Status   Campylobacter species NOT DETECTED NOT DETECTED Final   Plesimonas shigelloides NOT DETECTED NOT DETECTED Final   Salmonella species NOT DETECTED NOT DETECTED Final   Yersinia enterocolitica NOT DETECTED NOT DETECTED Final  Vibrio species NOT DETECTED NOT DETECTED Final   Vibrio cholerae NOT DETECTED NOT DETECTED Final   Enteroaggregative E coli (EAEC) NOT DETECTED NOT DETECTED Final   Enteropathogenic E coli (EPEC) NOT DETECTED NOT DETECTED Final   Enterotoxigenic E coli (ETEC) NOT DETECTED NOT DETECTED Final   Shiga like toxin producing E coli (STEC) NOT DETECTED NOT DETECTED Final   Shigella/Enteroinvasive E coli (EIEC) NOT DETECTED NOT DETECTED Final   Cryptosporidium NOT DETECTED NOT DETECTED Final   Cyclospora cayetanensis NOT DETECTED NOT DETECTED Final   Entamoeba histolytica NOT DETECTED NOT DETECTED Final   Giardia lamblia NOT DETECTED NOT DETECTED Final   Adenovirus F40/41 NOT DETECTED NOT DETECTED Final   Astrovirus NOT DETECTED NOT DETECTED Final   Norovirus GI/GII NOT DETECTED NOT DETECTED Final   Rotavirus A NOT DETECTED NOT DETECTED Final   Sapovirus (I, II, IV, and V) NOT DETECTED NOT DETECTED Final    Comment: Performed at [facility], [address], [place], [place] [address]     Time coordinating discharge: 25 minutes  SIGNED: Antonieta Pert, MD  [facility] 06/04/2022, 12:41 PM  If 7PM-7AM, please contact night-coverage [email or web address]

## 2022-06-05 ENCOUNTER — Encounter: Payer: PPO | Admitting: Family

## 2022-06-05 LAB — ECHO TEE

## 2022-06-06 DIAGNOSIS — I5031 Acute diastolic (congestive) heart failure: Secondary | ICD-10-CM | POA: Diagnosis not present

## 2022-06-06 DIAGNOSIS — J441 Chronic obstructive pulmonary disease with (acute) exacerbation: Secondary | ICD-10-CM | POA: Diagnosis not present

## 2022-06-06 DIAGNOSIS — I214 Non-ST elevation (NSTEMI) myocardial infarction: Secondary | ICD-10-CM | POA: Diagnosis not present

## 2022-06-06 DIAGNOSIS — Z7901 Long term (current) use of anticoagulants: Secondary | ICD-10-CM | POA: Diagnosis not present

## 2022-06-06 DIAGNOSIS — I130 Hypertensive heart and chronic kidney disease with heart failure and stage 1 through stage 4 chronic kidney disease, or unspecified chronic kidney disease: Secondary | ICD-10-CM | POA: Diagnosis not present

## 2022-06-06 DIAGNOSIS — I4891 Unspecified atrial fibrillation: Secondary | ICD-10-CM | POA: Diagnosis not present

## 2022-06-06 DIAGNOSIS — D509 Iron deficiency anemia, unspecified: Secondary | ICD-10-CM | POA: Diagnosis not present

## 2022-06-06 DIAGNOSIS — N1831 Chronic kidney disease, stage 3a: Secondary | ICD-10-CM | POA: Diagnosis not present

## 2022-06-06 DIAGNOSIS — F418 Other specified anxiety disorders: Secondary | ICD-10-CM | POA: Diagnosis not present

## 2022-06-06 DIAGNOSIS — C8590 Non-Hodgkin lymphoma, unspecified, unspecified site: Secondary | ICD-10-CM | POA: Diagnosis not present

## 2022-06-11 DIAGNOSIS — E871 Hypo-osmolality and hyponatremia: Secondary | ICD-10-CM | POA: Diagnosis not present

## 2022-06-11 DIAGNOSIS — E875 Hyperkalemia: Secondary | ICD-10-CM | POA: Diagnosis not present

## 2022-06-11 DIAGNOSIS — I129 Hypertensive chronic kidney disease with stage 1 through stage 4 chronic kidney disease, or unspecified chronic kidney disease: Secondary | ICD-10-CM | POA: Diagnosis not present

## 2022-06-11 DIAGNOSIS — R600 Localized edema: Secondary | ICD-10-CM | POA: Diagnosis not present

## 2022-06-11 DIAGNOSIS — N1832 Chronic kidney disease, stage 3b: Secondary | ICD-10-CM | POA: Diagnosis not present

## 2022-06-12 DIAGNOSIS — I480 Paroxysmal atrial fibrillation: Secondary | ICD-10-CM | POA: Diagnosis not present

## 2022-06-12 DIAGNOSIS — R0602 Shortness of breath: Secondary | ICD-10-CM | POA: Diagnosis not present

## 2022-06-12 DIAGNOSIS — J439 Emphysema, unspecified: Secondary | ICD-10-CM | POA: Diagnosis not present

## 2022-06-12 DIAGNOSIS — D649 Anemia, unspecified: Secondary | ICD-10-CM | POA: Diagnosis not present

## 2022-06-12 DIAGNOSIS — Z0000 Encounter for general adult medical examination without abnormal findings: Secondary | ICD-10-CM | POA: Diagnosis not present

## 2022-06-12 DIAGNOSIS — F411 Generalized anxiety disorder: Secondary | ICD-10-CM | POA: Diagnosis not present

## 2022-06-12 DIAGNOSIS — I10 Essential (primary) hypertension: Secondary | ICD-10-CM | POA: Diagnosis not present

## 2022-06-12 DIAGNOSIS — I700 Atherosclerosis of aorta: Secondary | ICD-10-CM | POA: Diagnosis not present

## 2022-06-12 DIAGNOSIS — N183 Chronic kidney disease, stage 3 unspecified: Secondary | ICD-10-CM | POA: Diagnosis not present

## 2022-06-12 DIAGNOSIS — E782 Mixed hyperlipidemia: Secondary | ICD-10-CM | POA: Diagnosis not present

## 2022-06-13 ENCOUNTER — Telehealth: Payer: Self-pay | Admitting: Cardiovascular Disease

## 2022-06-13 DIAGNOSIS — J441 Chronic obstructive pulmonary disease with (acute) exacerbation: Secondary | ICD-10-CM | POA: Diagnosis not present

## 2022-06-13 DIAGNOSIS — C8590 Non-Hodgkin lymphoma, unspecified, unspecified site: Secondary | ICD-10-CM | POA: Diagnosis not present

## 2022-06-13 DIAGNOSIS — N1831 Chronic kidney disease, stage 3a: Secondary | ICD-10-CM | POA: Diagnosis not present

## 2022-06-13 DIAGNOSIS — I4891 Unspecified atrial fibrillation: Secondary | ICD-10-CM | POA: Diagnosis not present

## 2022-06-13 DIAGNOSIS — I130 Hypertensive heart and chronic kidney disease with heart failure and stage 1 through stage 4 chronic kidney disease, or unspecified chronic kidney disease: Secondary | ICD-10-CM | POA: Diagnosis not present

## 2022-06-13 DIAGNOSIS — I5031 Acute diastolic (congestive) heart failure: Secondary | ICD-10-CM | POA: Diagnosis not present

## 2022-06-13 DIAGNOSIS — I214 Non-ST elevation (NSTEMI) myocardial infarction: Secondary | ICD-10-CM | POA: Diagnosis not present

## 2022-06-13 NOTE — Telephone Encounter (Signed)
Spoke with patients wife per release form. She wants to know if he can be seen earlier since he had his procedure on 2/27 (TEE-Guided DCCV). Scheduled him next available with APP here in our office. She was agreeable with date & time with no further questions at this time.

## 2022-06-13 NOTE — Telephone Encounter (Signed)
Patient's wife called stating her husband had a procedure done last week on 2/27 by Dr. Rockey Situ. She wants to know if he needs to be seen sooner than his 08/12/22 appt his currently has schedule with Dr. Rockey Situ.

## 2022-06-24 ENCOUNTER — Other Ambulatory Visit: Payer: Self-pay | Admitting: Cardiovascular Disease

## 2022-06-24 DIAGNOSIS — I480 Paroxysmal atrial fibrillation: Secondary | ICD-10-CM

## 2022-06-24 NOTE — Telephone Encounter (Signed)
Dr. Rockey Situ-   Please advise regarding amiodarone refill. The patient was discharged on 06/04/22 with instruction for:  amiodarone (PACERONE) 400 MG tablet 35 tablet 0 06/04/2022    Sig: Take 400 mg bid x 7 days then once a day until further instruction   Sent to pharmacy as: amiodarone (PACERONE) 400 MG tablet   E-Prescribing Status: Receipt confirmed by pharmacy (06/04/2022 12:40 PM EST)     He is not scheduled to follow up in the office until 07/16/22 with Ignacia Bayley. Do you want him to go ahead and reduce his amiodarone dose to 200 mg once daily now? He will have been on the 400 mg once daily dose for at least 2 weeks at this point.  Thank you!

## 2022-06-24 NOTE — Telephone Encounter (Signed)
Dr Rockey Situ, Please advise if OK to refill Eliquis as well since it was ordered in the hospital. Thanks, Lattie Haw

## 2022-06-24 NOTE — Telephone Encounter (Signed)
Refill Request.  

## 2022-06-24 NOTE — Telephone Encounter (Signed)
*  STAT* If patient is at the pharmacy, call can be transferred to refill team.   1. Which medications need to be refilled? (please list name of each medication and dose if known)   amiodarone (PACERONE) 400 MG tablet  apixaban (ELIQUIS) 5 MG TABS tablet  dapagliflozin propanediol (FARXIGA) 10 MG TABS tablet   2. Which pharmacy/location (including street and city if local pharmacy) is medication to be sent to? [place] (N), [place] [address]   3. Do they need a 30 day or 90 day supply? 90 day

## 2022-06-25 MED ORDER — APIXABAN 5 MG PO TABS: 5.0000 mg | ORAL_TABLET | Freq: Two times a day (BID) | ORAL | 0 refills | Status: DC

## 2022-06-25 NOTE — Telephone Encounter (Signed)
Pt is scheduled to see Ignacia Bayley, PA on 07/16/22.  Pt recently hospitalized, s/p TEE, last labs 06/04/22 Creat 1.69, age 77, weight 98.1kg, based on specified criteria pt is on appropriate dosage of Eliquis 5mg  BID for afib.  Will refill rx to get pt to upcoming appt.

## 2022-06-26 MED ORDER — DAPAGLIFLOZIN PROPANEDIOL 10 MG PO TABS: 10.0000 mg | ORAL_TABLET | Freq: Every day | ORAL | 0 refills | Status: DC

## 2022-06-26 NOTE — Telephone Encounter (Signed)
Pt's wife made aware of MD's recommendations and verbalized understanding. [facility].   Minna Merritts, MD  [facility] [phone] hours ago (6:40 PM)    Would continue amiodarone 400 daily (needs refill ) until we are able to get EKG to confirm normal sinus rhythm Perhaps he can swing by the hospital this week for EKG (if we are unable to do one in the office) If normal sinus rhythm we will drop the amiodarone down to 200 daily Thx TGollan

## 2022-06-26 NOTE — Telephone Encounter (Signed)
Please advise due to pt needing EKG.

## 2022-06-27 ENCOUNTER — Other Ambulatory Visit: Payer: Self-pay | Admitting: *Deleted

## 2022-06-27 ENCOUNTER — Ambulatory Visit
Admission: RE | Admit: 2022-06-27 | Discharge: 2022-06-27 | Disposition: A | Discharge status: Home / Self Care | Payer: PPO | Source: Ambulatory Visit | Attending: Cardiovascular Disease | Admitting: Cardiovascular Disease

## 2022-06-27 DIAGNOSIS — I480 Paroxysmal atrial fibrillation: Secondary | ICD-10-CM | POA: Diagnosis not present

## 2022-06-27 DIAGNOSIS — Z01810 Encounter for preprocedural cardiovascular examination: Secondary | ICD-10-CM

## 2022-06-27 MED ORDER — AMIODARONE HCL 200 MG PO TABS: 200.0000 mg | ORAL_TABLET | Freq: Every day | ORAL | 3 refills | Status: DC

## 2022-06-27 NOTE — Telephone Encounter (Signed)
EKG uploaded for review.  Will forward to Dr. Rockey Situ to make aware.

## 2022-06-27 NOTE — Telephone Encounter (Signed)
Per secure chat Dr. Rockey Situ would like [place] to decrease patients amiodarone to 200 mg once daily. Will reach out to patient to review change.

## 2022-06-27 NOTE — Telephone Encounter (Signed)
Spoke with wife per release form. Reviewed instructions on amiodarone to take 200 mg once daily. She repeated instructions back and verbalized understanding. She was appreciative for the call back with no further questions at this time.

## 2022-07-08 DIAGNOSIS — C8590 Non-Hodgkin lymphoma, unspecified, unspecified site: Secondary | ICD-10-CM | POA: Diagnosis not present

## 2022-07-08 DIAGNOSIS — Z7901 Long term (current) use of anticoagulants: Secondary | ICD-10-CM | POA: Diagnosis not present

## 2022-07-08 DIAGNOSIS — I130 Hypertensive heart and chronic kidney disease with heart failure and stage 1 through stage 4 chronic kidney disease, or unspecified chronic kidney disease: Secondary | ICD-10-CM | POA: Diagnosis not present

## 2022-07-08 DIAGNOSIS — J441 Chronic obstructive pulmonary disease with (acute) exacerbation: Secondary | ICD-10-CM | POA: Diagnosis not present

## 2022-07-08 DIAGNOSIS — D509 Iron deficiency anemia, unspecified: Secondary | ICD-10-CM | POA: Diagnosis not present

## 2022-07-08 DIAGNOSIS — I5031 Acute diastolic (congestive) heart failure: Secondary | ICD-10-CM | POA: Diagnosis not present

## 2022-07-08 DIAGNOSIS — I214 Non-ST elevation (NSTEMI) myocardial infarction: Secondary | ICD-10-CM | POA: Diagnosis not present

## 2022-07-08 DIAGNOSIS — F418 Other specified anxiety disorders: Secondary | ICD-10-CM | POA: Diagnosis not present

## 2022-07-08 DIAGNOSIS — G4733 Obstructive sleep apnea (adult) (pediatric): Secondary | ICD-10-CM | POA: Diagnosis not present

## 2022-07-08 DIAGNOSIS — N1831 Chronic kidney disease, stage 3a: Secondary | ICD-10-CM | POA: Diagnosis not present

## 2022-07-08 DIAGNOSIS — I4891 Unspecified atrial fibrillation: Secondary | ICD-10-CM | POA: Diagnosis not present

## 2022-07-10 DIAGNOSIS — H2513 Age-related nuclear cataract, bilateral: Secondary | ICD-10-CM | POA: Diagnosis not present

## 2022-07-10 DIAGNOSIS — H401133 Primary open-angle glaucoma, bilateral, severe stage: Secondary | ICD-10-CM | POA: Diagnosis not present

## 2022-07-13 ENCOUNTER — Other Ambulatory Visit: Payer: Self-pay | Admitting: Cardiovascular Disease

## 2022-07-14 DIAGNOSIS — E782 Mixed hyperlipidemia: Secondary | ICD-10-CM | POA: Diagnosis not present

## 2022-07-14 DIAGNOSIS — F411 Generalized anxiety disorder: Secondary | ICD-10-CM | POA: Diagnosis not present

## 2022-07-14 DIAGNOSIS — I480 Paroxysmal atrial fibrillation: Secondary | ICD-10-CM | POA: Diagnosis not present

## 2022-07-14 DIAGNOSIS — N183 Chronic kidney disease, stage 3 unspecified: Secondary | ICD-10-CM | POA: Diagnosis not present

## 2022-07-14 DIAGNOSIS — I10 Essential (primary) hypertension: Secondary | ICD-10-CM | POA: Diagnosis not present

## 2022-07-14 DIAGNOSIS — Z79899 Other long term (current) drug therapy: Secondary | ICD-10-CM | POA: Diagnosis not present

## 2022-07-15 ENCOUNTER — Ambulatory Visit (INDEPENDENT_AMBULATORY_CARE_PROVIDER_SITE_OTHER): Payer: PPO | Admitting: Student in an Organized Health Care Education/Training Program

## 2022-07-15 ENCOUNTER — Encounter: Payer: Self-pay | Admitting: Student in an Organized Health Care Education/Training Program

## 2022-07-15 VITALS — BP 134/76 | HR 73 | Temp 97.7°F | Ht 71.0 in | Wt 220.8 lb

## 2022-07-15 DIAGNOSIS — R0602 Shortness of breath: Secondary | ICD-10-CM | POA: Diagnosis not present

## 2022-07-15 NOTE — Progress Notes (Signed)
Synopsis: Referred in for shortness of breath by Gavin Arbour, MD  Assessment & Plan:   #Shortness of Breath #Atrial Fibrillation #History of Smoking  Patient is presenting for the evaluation of exertional dyspnea that appears to have improved following the control of his atrial fibrillation. He feels that he is now back to his baseline. He currently denies any cough. On exam, he does have rales over the right lower lung field and does have some pitting edema in the lower extremities. History is also notable for primary spontaneous pneumothorax in 2019 (treated with blebectomy and talc pleurodesis).  While the patient has improved, he is at risk for obstructive lung disease. His symptoms were most likely from his afib, with the differential also including heart failure, medication toxicity, and ILD. I will obtain a pulmonary function test to assess for any obstruction. Furthermore, I will obtain a repeat chest CT to assess for any infiltrates that could account for the rales noted on exam. Patient will continue to follow up with his other providers for the management of his afib, NSTEMI, and lower extremity edema.  -chest CT -PFT's  Return in about 3 months (around 10/14/2022).  I spent 45 minutes caring for this patient today, including preparing to see the patient, obtaining a medical history , reviewing a separately obtained history, performing a medically appropriate examination and/or evaluation, counseling and educating the patient/family/caregiver, ordering medications, tests, or procedures, and documenting clinical information in the electronic health record  Gavin Chute, MD [facility] 07/15/2022 6:30 PM    End of visit medications:  No orders of the defined types were placed in this encounter.    Current Outpatient Medications:    acetaminophen (TYLENOL) 500 MG tablet, Take 1,000 mg by mouth 2 (two) times daily as needed for moderate pain., Disp: ,  Rfl:    albuterol (PROVENTIL HFA;VENTOLIN HFA) 108 (90 Base) MCG/ACT inhaler, Inhale 2 puffs into the lungs every 6 (six) hours as needed for wheezing or shortness of breath., Disp: 1 Inhaler, Rfl: 3   amiodarone (PACERONE) 200 MG tablet, Take 1 tablet (200 mg total) by mouth daily., Disp: 90 tablet, Rfl: 3   apixaban (ELIQUIS) 5 MG TABS tablet, Take 1 tablet (5 mg total) by mouth 2 (two) times daily., Disp: 180 tablet, Rfl: 0   aspirin EC 81 MG tablet, Take 1 tablet (81 mg total) by mouth daily. Swallow whole., Disp: 90 tablet, Rfl: 3   atorvastatin (LIPITOR) 80 MG tablet, Take 1 tablet by mouth once daily, Disp: 30 tablet, Rfl: 0   brimonidine-timolol (COMBIGAN) 0.2-0.5 % ophthalmic solution, Place 1 drop into both eyes every 12 (twelve) hours., Disp: , Rfl:    Calcium Carb-Cholecalciferol (CALCIUM 600 + D PO), Take 1 tablet by mouth daily., Disp: , Rfl:    dapagliflozin propanediol (FARXIGA) 10 MG TABS tablet, Take 1 tablet (10 mg total) by mouth daily., Disp: 90 tablet, Rfl: 0   fenofibrate 160 MG tablet, Take 160 mg by mouth daily. , Disp: , Rfl:    ferrous sulfate 325 (65 FE) MG tablet, Take 325 mg by mouth daily., Disp: , Rfl:    fluticasone (FLONASE) 50 MCG/ACT nasal spray, Place 2 sprays into both nostrils daily. , Disp: , Rfl:    gabapentin (NEURONTIN) 300 MG capsule, Take 300 mg by mouth 3 (three) times daily., Disp: , Rfl:    imipramine (TOFRANIL) 25 MG tablet, Take 25 mg by mouth at bedtime., Disp: , Rfl:    LUMIGAN  0.01 % SOLN, Place 1 drop into both eyes at bedtime., Disp: , Rfl:    magnesium oxide (MAG-OX) 400 MG tablet, Take 400 mg by mouth 3 (three) times daily., Disp: , Rfl:    metoprolol succinate (TOPROL-XL) 25 MG 24 hr tablet, Take 1 tablet (25 mg total) by mouth every evening., Disp: 90 tablet, Rfl: 3   Multiple Vitamin (MULTIVITAMIN WITH MINERALS) TABS tablet, Take 1 tablet by mouth daily., Disp: , Rfl:    pantoprazole (PROTONIX) 40 MG tablet, Take 40 mg by mouth daily. ,  Disp: , Rfl:    polycarbophil (FIBERCON) 625 MG tablet, Take 625 mg by mouth 2 (two) times daily., Disp: , Rfl:    sertraline (ZOLOFT) 50 MG tablet, Take 50 mg by mouth at bedtime., Disp: , Rfl:    Subjective:   PATIENT ID: Gavin Maxwell GENDER: male DOB: May 10, 1945, MRN: [id number]  Chief Complaint  Patient presents with   pulmonary consult    Occ SOB with exertion and dry cough.     HPI  Gavin Maxwell is a pleasant 77 year old male presenting to clinic for the evaluation of shortness of breath.  Patient reports he was in his usual state of health but a few months ago started to notice exertional dyspnea. He also noticed some associated cough with it. The dyspnea was mostly when going up an incline, and he subsequently developed palpitations. He was seen by his primary care provider, and was eventually seen by cardiology and diagnosed with atrial fibrillation with a rapid ventricular response after being admitted on 05/28/2022. Patient was started on Eliquis, prescribed amiodarone, and also underwent electrical cardioversion. He was also noted to have a NSTEMI, and did not undergo LHC due to lack of chest pain as well as AKI on CKD. He is to follow up with cardiology, and has an appointment scheduled tomorrow. There was concern for COPD exacerbation, and the patient was given prednisone, antibiotics, and inhalers.  Today, he reports that his symptoms have fully resolved. He is using flonase for nasal drip and the cough is resolved. The shortness of breath is also resolved, and he feels he is back to his baseline. No fevers, chills, night sweats reported. He had 12 lbs of weight loss following said admission.  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 He was previusly seen by Gavin Maxwell in our office, last seen  by him in 2019.  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 He is retired.  Ancillary information including prior medications, full medical/surgical/family/social histories, and PFTs (when available) are listed below and have been reviewed.   Review of Systems  Constitutional:  Negative for chills, fever, malaise/fatigue and weight loss.  Respiratory:  Negative for cough, sputum production, shortness of breath and wheezing.   Cardiovascular:  Positive for leg swelling.     Objective:   Vitals:   07/15/22 1529  BP: 134/76  Pulse: 73  Temp: 97.7 F (36.5 C)  TempSrc: Temporal  SpO2: 98%  Weight: 220 lb 12.8 oz (100.2 kg)  Height: 5\' 11"  (1.803 m)   98% on RA BMI Readings from Last 3 Encounters:  07/15/22 30.80 kg/m  06/04/22 30.16 kg/m  06/05/21 32.64 kg/m   Wt Readings  from Last 3 Encounters:  07/15/22 220 lb 12.8 oz (100.2 kg)  06/04/22 216 lb 4.3 oz (98.1 kg)  06/05/21 234 lb (106.1 kg)    Physical Exam Constitutional:      General: He is not in acute distress.    Appearance: He is obese. He is ill-appearing.  HENT:     Mouth/Throat:     Mouth: Mucous membranes are moist.  Cardiovascular:     Rate and Rhythm: Normal rate. Rhythm irregular.     Pulses: Normal pulses.     Heart sounds: Normal heart sounds.  Pulmonary:     Effort: Pulmonary effort is normal.     Breath sounds: Rales (over the right lower lung field) present.  Abdominal:     General: There is distension.     Palpations: Abdomen is soft.  Musculoskeletal:     Right lower leg: Edema present.     Left lower leg: Edema present.  Skin:    General: Skin is warm.  Neurological:     General: No focal deficit present.     Mental Status: He is alert and oriented to person, place, and time. Mental status is at baseline.     Ancillary  Information    Past Medical History:  Diagnosis Date   Anxiety    Cancer    non hodgins  lymphoma   2004 in remission  Left arm  wears a brace there is a bone broken   CKD (chronic kidney disease) stage 3, GFR 30-59 ml/min    COPD (chronic obstructive pulmonary disease)    Depression    Dysrhythmia    SVT   GERD (gastroesophageal reflux disease)    History of hiatal hernia    Hypertension    Pneumonia    Primary localized osteoarthritis of knee 09/26/2014   Primary localized osteoarthrosis, shoulder region, rotator cuff arthropathy 10/04/2013   Sleep apnea    cpap     Family History  Problem Relation Age of Onset   Breast cancer Mother    Hypertension Mother    Rheum arthritis Father    Cancer Father    Lung cancer Brother    Stroke Paternal Grandfather      Past Surgical History:  Procedure Laterality Date   CARDIAC CATHETERIZATION     20 yrs. ago   CARDIOVERSION N/A 06/03/2022   Procedure: CARDIOVERSION;  Surgeon: Antonieta IbaGollan, Timothy J, MD;  Location: [facility];  Service: Cardiovascular;  Laterality: N/A;   CHOLECYSTECTOMY     EYE SURGERY Left    pt states he was "seeing double" and they did surgery to fix it   NASAL SINUS SURGERY     x2   REVERSE SHOULDER ARTHROPLASTY Right 10/04/2013   Procedure: REVERSE SHOULDER ARTHROPLASTY;  Surgeon: Eulas PostJoshua P Landau, MD;  Location: [facility];  Service: Orthopedics;  Laterality: Right;   SHOULDER ACROMIOPLASTY Left    x 5 shoulder surgeries   SVT ABLATION N/A 03/07/2019   Procedure: SVT ABLATION;  Surgeon: Regan Lemmingamnitz, Will Martin, MD;  Location: [facility];  Service: Cardiovascular;  Laterality: N/A;   TEE WITHOUT CARDIOVERSION N/A 06/03/2022   Procedure: TRANSESOPHAGEAL ECHOCARDIOGRAM;  Surgeon: Antonieta IbaGollan, Timothy J, MD;  Location: [facility];  Service: Cardiovascular;  Laterality: N/A;   TOTAL HIP ARTHROPLASTY Right 07/26/2019   Procedure: TOTAL HIP ARTHROPLASTY;  Surgeon: Teryl LucyLandau, Joshua, MD;  Location: [facility];  Service: Orthopedics;   Laterality: Right;   TOTAL HIP ARTHROPLASTY Left 1998   TOTAL KNEE ARTHROPLASTY Right 09/26/2014  Procedure: RIGHT TOTAL KNEE ARTHROPLASTY;  Surgeon: Teryl Lucy, MD;  Location: [facility];  Service: Orthopedics;  Laterality: Right;   TRANSFORAMINAL LUMBAR INTERBODY FUSION (TLIF) WITH PEDICLE SCREW FIXATION 2 LEVEL Right 06/05/2021   Procedure: RIGHT-SIDED LUMBAR 3- LUMBAR 4, LUMBAR 4- LUMBAR 5 TRANSFORAMINAL LUMBAR INTERBODY FUSION AND DECOMPRESSION WITH INSTRUMENTATION AND ALLOGRAFT;  Surgeon: Estill Bamberg, MD;  Location: [facility];  Service: Orthopedics;  Laterality: Right;   VIDEO ASSISTED THORACOSCOPY (VATS) W/TALC PLEUADESIS Right 08/18/2017   Procedure: VIDEO ASSISTED THORACOSCOPY (VATS)POSSIBLE  W/TALC PLEUADESIS.POSSIBLE BLEBECTOMY;  Surgeon: Hulda Marin, MD;  Location: [facility];  Service: Thoracic;  Laterality: Right;    Social History   Socioeconomic History   Marital status: Married    Spouse name: Not on file   Number of children: 2   Years of education: Not on file   Highest education level: Not on file  Occupational History   Not on file  Tobacco Use   Smoking status: Former    Packs/day: 2.00    Years: 20.00    Additional pack years: 0.00    Total pack years: 40.00    Types: Cigarettes    Quit date: 3    Years since quitting: 34.2   Smokeless tobacco: Never   Tobacco comments:    quit 25 years ago  Vaping Use   Vaping Use: Never used  Substance and Sexual Activity   Alcohol use: No   Drug use: No   Sexual activity: Not Currently    Birth control/protection: None  Other Topics Concern   Not on file  Social History Narrative   Not on file   Social Determinants of Health   Financial Resource Strain: Not on file  Food Insecurity: No Food Insecurity (05/28/2022)   Hunger Vital Sign    Worried About Running Out of Food in the Last Year: Never true    Ran Out of Food in the Last Year: Never true  Transportation Needs: No Transportation Needs (05/28/2022)    PRAPARE - Administrator, Civil Service (Medical): No    Lack of Transportation (Non-Medical): No  Physical Activity: Not on file  Stress: Not on file  Social Connections: Not on file  Intimate Partner Violence: Not At Risk (05/28/2022)   Humiliation, Afraid, Rape, and Kick questionnaire    Fear of Current or Ex-Partner: No    Emotionally Abused: No    Physically Abused: No    Sexually Abused: No     Allergies  Allergen Reactions   Augmentin [Amoxicillin-Pot Clavulanate] Nausea Only    Did it involve swelling of the face/tongue/throat, SOB, or low BP? No Did it involve sudden or severe rash/hives, skin peeling, or any reaction on the inside of your mouth or nose? No Did you need to seek medical attention at a hospital or doctor's office? No When did it last happen? More than 10 years If all above answers are "NO", may proceed with cephalosporin use.    Celebrex [Celecoxib] Nausea Only   Oxycodone Other (See Comments)    Confusion, makes him "crazy"     CBC    Component Value Date/Time   WBC 8.8 06/01/2022 0500   RBC 3.26 (L) 06/01/2022 0500   HGB 10.2 (L) 06/01/2022 0500   HGB 13.6 09/23/2012 1018   HCT 30.6 (L) 06/01/2022 0500   HCT 38.3 (L) 09/23/2012 1018   PLT 255 06/01/2022 0500   PLT 215 09/23/2012 1018   MCV 93.9 06/01/2022 0500   MCV  92 09/23/2012 1018   MCH 31.3 06/01/2022 0500   MCHC 33.3 06/01/2022 0500   RDW 13.4 06/01/2022 0500   RDW 12.8 09/23/2012 1018   LYMPHSABS 0.7 05/28/2022 1009   LYMPHSABS 1.4 09/23/2012 1018   MONOABS 0.8 05/28/2022 1009   MONOABS 0.5 09/23/2012 1018   EOSABS 0.1 05/28/2022 1009   EOSABS 0.3 09/23/2012 1018   BASOSABS 0.0 05/28/2022 1009   BASOSABS 0.1 09/23/2012 1018    Pulmonary Functions Testing Results:     No data to display          Outpatient Medications Prior to Visit  Medication Sig Dispense Refill   acetaminophen (TYLENOL) 500 MG tablet Take 1,000 mg by mouth 2 (two) times daily as needed for  moderate pain.     albuterol (PROVENTIL HFA;VENTOLIN HFA) 108 (90 Base) MCG/ACT inhaler Inhale 2 puffs into the lungs every 6 (six) hours as needed for wheezing or shortness of breath. 1 Inhaler 3   amiodarone (PACERONE) 200 MG tablet Take 1 tablet (200 mg total) by mouth daily. 90 tablet 3   apixaban (ELIQUIS) 5 MG TABS tablet Take 1 tablet (5 mg total) by mouth 2 (two) times daily. 180 tablet 0   aspirin EC 81 MG tablet Take 1 tablet (81 mg total) by mouth daily. Swallow whole. 90 tablet 3   atorvastatin (LIPITOR) 80 MG tablet Take 1 tablet by mouth once daily 30 tablet 0   brimonidine-timolol (COMBIGAN) 0.2-0.5 % ophthalmic solution Place 1 drop into both eyes every 12 (twelve) hours.     Calcium Carb-Cholecalciferol (CALCIUM 600 + D PO) Take 1 tablet by mouth daily.     dapagliflozin propanediol (FARXIGA) 10 MG TABS tablet Take 1 tablet (10 mg total) by mouth daily. 90 tablet 0   fenofibrate 160 MG tablet Take 160 mg by mouth daily.      ferrous sulfate 325 (65 FE) MG tablet Take 325 mg by mouth daily.     fluticasone (FLONASE) 50 MCG/ACT nasal spray Place 2 sprays into both nostrils daily.      gabapentin (NEURONTIN) 300 MG capsule Take 300 mg by mouth 3 (three) times daily.     imipramine (TOFRANIL) 25 MG tablet Take 25 mg by mouth at bedtime.     LUMIGAN 0.01 % SOLN Place 1 drop into both eyes at bedtime.     magnesium oxide (MAG-OX) 400 MG tablet Take 400 mg by mouth 3 (three) times daily.     metoprolol succinate (TOPROL-XL) 25 MG 24 hr tablet Take 1 tablet (25 mg total) by mouth every evening. 90 tablet 3   Multiple Vitamin (MULTIVITAMIN WITH MINERALS) TABS tablet Take 1 tablet by mouth daily.     pantoprazole (PROTONIX) 40 MG tablet Take 40 mg by mouth daily.      polycarbophil (FIBERCON) 625 MG tablet Take 625 mg by mouth 2 (two) times daily.     sertraline (ZOLOFT) 50 MG tablet Take 50 mg by mouth at bedtime.     azithromycin (ZITHROMAX) 250 MG tablet Take 250 mg by mouth daily.      fluticasone-salmeterol (ADVAIR) 100-50 MCG/ACT AEPB Inhale 1 puff into the lungs 2 (two) times daily. (Patient not taking: Reported on 07/15/2022)     No facility-administered medications prior to visit.

## 2022-07-15 NOTE — Progress Notes (Signed)
Cardiology Office Note:    Date:  07/15/2022   ID:  Gavin Maxwell, DOB Dec 05, 1945, MRN [id number]  PCP:  Marguarite Arbour, MD   [facility] Providers Cardiologist:  Julien Nordmann, MD { Click to update primary MD,subspecialty MD or APP then REFRESH:1}    Referring MD: Marguarite Arbour, MD   No chief complaint on file. ***  History of Present Illness:    Gavin Maxwell is a 77 y.o. male with a hx of CAD noted on CT scan in 2022, hypertension, atrial fibrillation, HFrEF, PSVT s/p catheter ablation in 2020, OSA on CPAP, COPD, GERD, CKD stage III, HLD, non-Hodgkin lymphoma in remission, depression, anxiety, iron deficiency anemia.  He was initially evaluated by [facility] in 2020 after establishing with Dr. Mariah Milling, he was referred to Dr. Elberta Fortis for evaluation of SVT.  He had worn a cardiac monitor that showed episodes of SVT that were occurring with near syncopal episodes. Underwent ablation on 03/08/2019.   Admitted 05/28/2022 to 06/04/2022 after presenting to the ED with a 5-day history of shortness of breath that had been getting progressively worse.  He had recently been evaluated by his PCP and diagnosed with COPD exacerbation, given doxycycline and prednisone without improvement.  He was noted to be in new onset A-fib with RVR and was evaluated by cardiology on 06/03/22.  He underwent a TEE which revealed an EF of 45 to 50%, global hypokinesis, mild MR, grade 3 atheroma plaque involving the aortic arch and descending aorta.  Troponin was elevated peaking at 2196--patient did not report any chest pain-- felt to be related to demand ischemia (not a candidate for MPI d/t body habitus).  He was ultimately successfully cardioverted X1 at 150 J, remaining in sinus rhythm at discharge.  Plans were to continue amiodarone 400 mg twice daily for the next 7 days, then reduce to 400 mg daily.  His blood pressure was soft during this admission and his losartan was held.  Repeat CMET, TSH,  FT4 Past Medical History:  Diagnosis Date   Anxiety    Cancer    non hodgins  lymphoma   2004 in remission  Left arm  wears a brace there is a bone broken   CKD (chronic kidney disease) stage 3, GFR 30-59 ml/min    COPD (chronic obstructive pulmonary disease)    Depression    Dysrhythmia    SVT   GERD (gastroesophageal reflux disease)    History of hiatal hernia    Hypertension    Pneumonia    Primary localized osteoarthritis of knee 09/26/2014   Primary localized osteoarthrosis, shoulder region, rotator cuff arthropathy 10/04/2013   Sleep apnea    cpap    Past Surgical History:  Procedure Laterality Date   CARDIAC CATHETERIZATION     20 yrs. ago   CARDIOVERSION N/A 06/03/2022   Procedure: CARDIOVERSION;  Surgeon: Antonieta Iba, MD;  Location: [facility] ORS;  Service: Cardiovascular;  Laterality: N/A;   CHOLECYSTECTOMY     EYE SURGERY Left    pt states he was "seeing double" and they did surgery to fix it   NASAL SINUS SURGERY     x2   REVERSE SHOULDER ARTHROPLASTY Right 10/04/2013   Procedure: REVERSE SHOULDER ARTHROPLASTY;  Surgeon: Eulas Post, MD;  Location: [facility] OR;  Service: Orthopedics;  Laterality: Right;   SHOULDER ACROMIOPLASTY Left    x 5 shoulder surgeries   SVT ABLATION N/A 03/07/2019   Procedure: SVT ABLATION;  Surgeon: Elberta Fortis, Will  Daphine Deutscher, MD;  Location: [facility] INVASIVE CV LAB;  Service: Cardiovascular;  Laterality: N/A;   TEE WITHOUT CARDIOVERSION N/A 06/03/2022   Procedure: TRANSESOPHAGEAL ECHOCARDIOGRAM;  Surgeon: Antonieta Iba, MD;  Location: [facility] ORS;  Service: Cardiovascular;  Laterality: N/A;   TOTAL HIP ARTHROPLASTY Right 07/26/2019   Procedure: TOTAL HIP ARTHROPLASTY;  Surgeon: Teryl Lucy, MD;  Location: [facility] ORS;  Service: Orthopedics;  Laterality: Right;   TOTAL HIP ARTHROPLASTY Left 1998   TOTAL KNEE ARTHROPLASTY Right 09/26/2014   Procedure: RIGHT TOTAL KNEE ARTHROPLASTY;  Surgeon: Teryl Lucy, MD;  Location: [facility] OR;  Service: Orthopedics;   Laterality: Right;   TRANSFORAMINAL LUMBAR INTERBODY FUSION (TLIF) WITH PEDICLE SCREW FIXATION 2 LEVEL Right 06/05/2021   Procedure: RIGHT-SIDED LUMBAR 3- LUMBAR 4, LUMBAR 4- LUMBAR 5 TRANSFORAMINAL LUMBAR INTERBODY FUSION AND DECOMPRESSION WITH INSTRUMENTATION AND ALLOGRAFT;  Surgeon: Estill Bamberg, MD;  Location: [facility] OR;  Service: Orthopedics;  Laterality: Right;   VIDEO ASSISTED THORACOSCOPY (VATS) W/TALC PLEUADESIS Right 08/18/2017   Procedure: VIDEO ASSISTED THORACOSCOPY (VATS)POSSIBLE  W/TALC PLEUADESIS.POSSIBLE BLEBECTOMY;  Surgeon: Hulda Marin, MD;  Location: [facility] ORS;  Service: Thoracic;  Laterality: Right;    Current Medications: No outpatient medications have been marked as taking for the 07/16/22 encounter (Appointment) with Creig Hines, NP.     Allergies:   Augmentin [amoxicillin-pot clavulanate], Celebrex [celecoxib], and Oxycodone   Social History   Socioeconomic History   Marital status: Married    Spouse name: Not on file   Number of children: 2   Years of education: Not on file   Highest education level: Not on file  Occupational History   Not on file  Tobacco Use   Smoking status: Former    Packs/day: 2.00    Years: 20.00    Additional pack years: 0.00    Total pack years: 40.00    Types: Cigarettes    Quit date: 77    Years since quitting: 34.2   Smokeless tobacco: Never   Tobacco comments:    quit 25 years ago  Vaping Use   Vaping Use: Never used  Substance and Sexual Activity   Alcohol use: No   Drug use: No   Sexual activity: Not Currently    Birth control/protection: None  Other Topics Concern   Not on file  Social History Narrative   Not on file   Social Determinants of Health   Financial Resource Strain: Not on file  Food Insecurity: No Food Insecurity (05/28/2022)   Hunger Vital Sign    Worried About Running Out of Food in the Last Year: Never true    Ran Out of Food in the Last Year: Never true  Transportation Needs: No  Transportation Needs (05/28/2022)   PRAPARE - Administrator, Civil Service (Medical): No    Lack of Transportation (Non-Medical): No  Physical Activity: Not on file  Stress: Not on file  Social Connections: Not on file     Family History: The patient's ***family history includes Breast cancer in his mother; Cancer in his father; Hypertension in his mother; Lung cancer in his brother; Rheum arthritis in his father; Stroke in his paternal grandfather.  ROS:   Please see the history of present illness.    *** All other systems reviewed and are negative.  EKGs/Labs/Other Studies Reviewed:    The following studies were reviewed today: ***  EKG:  EKG is *** ordered today.  The ekg ordered today demonstrates ***  Recent Labs: 05/28/2022: B Natriuretic Peptide  215.1; TSH 2.008 05/29/2022: ALT 50 06/01/2022: Hemoglobin 10.2; Platelets 255 06/04/2022: BUN 47; Creatinine, Ser 1.69; Magnesium 1.9; Potassium 5.3; Sodium 132  Recent Lipid Panel    Component Value Date/Time   CHOL 69 05/29/2022 0618   TRIG 154 (H) 05/29/2022 0618   HDL 11 (L) 05/29/2022 0618   CHOLHDL 6.3 05/29/2022 0618   VLDL 31 05/29/2022 0618   LDLCALC 27 05/29/2022 0618     Risk Assessment/Calculations:   {Does this patient have ATRIAL FIBRILLATION?:[phone]}            Physical Exam:    VS:  There were no vitals taken for this visit.    Wt Readings from Last 3 Encounters:  07/15/22 220 lb 12.8 oz (100.2 kg)  06/04/22 216 lb 4.3 oz (98.1 kg)  06/05/21 234 lb (106.1 kg)     GEN: *** Well nourished, well developed in no acute distress HEENT: Normal NECK: No JVD; No carotid bruits LYMPHATICS: No lymphadenopathy CARDIAC: ***RRR, no murmurs, rubs, gallops RESPIRATORY:  Clear to auscultation without rales, wheezing or rhonchi  ABDOMEN: Soft, non-tender, non-distended MUSCULOSKELETAL:  No edema; No deformity  SKIN: Warm and dry NEUROLOGIC:  Alert and oriented x 3 PSYCHIATRIC:  Normal affect    ASSESSMENT:    No diagnosis found. PLAN:    In order of problems listed above:  ***  {The patient has an active order for outpatient cardiac rehabilitation.   Please indicate if the patient is ready to start. Do NOT delete this.  It will auto delete.  Refresh note, then sign.              Click here to document readiness and see contraindications.  :1}  Cardiac Rehabilitation Eligibility Assessment       {Are you ordering a CV Procedure (e.g. stress test, cath, DCCV, TEE, etc)?   Press F2        :[id number]}    Medication Adjustments/Labs and Tests Ordered: Current medicines are reviewed at length with the patient today.  Concerns regarding medicines are outlined above.  No orders of the defined types were placed in this encounter.  No orders of the defined types were placed in this encounter.   There are no Patient Instructions on file for this visit.   Signed, Flossie DibbleJennifer C Alanda Colton, NP  07/15/2022 7:21 PM    [facility]

## 2022-07-16 ENCOUNTER — Ambulatory Visit: Payer: PPO

## 2022-07-16 ENCOUNTER — Ambulatory Visit: Payer: PPO | Attending: Nurse Practitioner | Admitting: Cardiology

## 2022-07-16 ENCOUNTER — Encounter: Payer: Self-pay | Admitting: Nurse Practitioner

## 2022-07-16 ENCOUNTER — Other Ambulatory Visit
Admission: RE | Admit: 2022-07-16 | Discharge: 2022-07-16 | Disposition: A | Discharge status: Home / Self Care | Payer: PPO | Source: Ambulatory Visit | Attending: Nurse Practitioner | Admitting: Nurse Practitioner

## 2022-07-16 VITALS — BP 110/60 | HR 72 | Ht 71.0 in | Wt 217.0 lb

## 2022-07-16 DIAGNOSIS — E782 Mixed hyperlipidemia: Secondary | ICD-10-CM | POA: Insufficient documentation

## 2022-07-16 DIAGNOSIS — I5020 Unspecified systolic (congestive) heart failure: Secondary | ICD-10-CM

## 2022-07-16 DIAGNOSIS — I10 Essential (primary) hypertension: Secondary | ICD-10-CM

## 2022-07-16 DIAGNOSIS — I480 Paroxysmal atrial fibrillation: Secondary | ICD-10-CM

## 2022-07-16 DIAGNOSIS — N1832 Chronic kidney disease, stage 3b: Secondary | ICD-10-CM | POA: Insufficient documentation

## 2022-07-16 DIAGNOSIS — R079 Chest pain, unspecified: Secondary | ICD-10-CM | POA: Diagnosis not present

## 2022-07-16 DIAGNOSIS — I2510 Atherosclerotic heart disease of native coronary artery without angina pectoris: Secondary | ICD-10-CM | POA: Insufficient documentation

## 2022-07-16 LAB — TSH: TSH: 7.597 u[IU]/mL — ABNORMAL HIGH (ref 0.350–4.500)

## 2022-07-16 LAB — T4, FREE: Free T4: 0.95 ng/dL (ref 0.61–1.12)

## 2022-07-16 MED ORDER — FUROSEMIDE 20 MG PO TABS: 20.0000 mg | ORAL_TABLET | Freq: Every day | ORAL | 3 refills | Status: DC

## 2022-07-16 NOTE — Patient Instructions (Signed)
Medication Instructions:  Your physician has recommended you make the following change in your medication:   START - furosemide (LASIX) 20 MG tablet - Take 1 tablet by mouth daily for 2 days, then as needed for leg swelling  *If you need a refill on your cardiac medications before your next appointment, please call your pharmacy*  Lab Work: Your physician recommends that you get lab work today: TSH and Free T4 Sales executive at [facility] 1st desk on the right to check in (REGISTRATION)  Lab hours: Monday- Friday (7:30 am- 5:30 pm)  If you have labs (blood work) drawn today and your tests are completely normal, you will receive your results only by: MyChart Message (if you have MyChart) OR A paper copy in the mail If you have any lab test that is abnormal or we need to change your treatment, we will call you to review the results.   Testing/Procedures: Your provider has ordered a Lexiscan/ Exercise Myoview Stress test. This will take place at [facility]. Please report to the [facility] medical mall entrance. The volunteers at the first desk will direct you where to go.  [facility] MYOVIEW  Your provider has ordered a Stress Test with nuclear imaging. The purpose of this test is to evaluate the blood supply to your heart muscle. This procedure is referred to as a "Non-Invasive Stress Test." This is because other than having an IV started in your vein, nothing is inserted or "invades" your body. Cardiac stress tests are done to find areas of poor blood flow to the heart by determining the extent of coronary artery disease (CAD). Some patients exercise on a treadmill, which naturally increases the blood flow to your heart, while others who are unable to walk on a treadmill due to physical limitations will have a pharmacologic/chemical stress agent called Lexiscan . This medicine will mimic walking on a treadmill by temporarily increasing your coronary blood flow.   Please note: these test may take anywhere  between 2-4 hours to complete  How to prepare for your Myoview test:  Nothing to eat for 6 hours prior to the test No caffeine for 24 hours prior to test No smoking 24 hours prior to test. Your medication may be taken with water.  If your doctor stopped a medication because of this test, do not take that medication. Ladies, please do not wear dresses.  Skirts or pants are appropriate. Please wear a short sleeve shirt. No perfume, cologne or lotion. Wear comfortable walking shoes. No heels!   PLEASE NOTIFY THE OFFICE AT LEAST 24 HOURS IN ADVANCE IF YOU ARE UNABLE TO KEEP YOUR APPOINTMENT.  [phone] AND  PLEASE NOTIFY NUCLEAR MEDICINE AT [facility] AT LEAST 24 HOURS IN ADVANCE IF YOU ARE UNABLE TO KEEP YOUR APPOINTMENT. [phone]   Heart Monitor:  Length of Wear: 14 days  Your monitor will be mailed to your home address within 3-5 business days. However, if you have not received your monitor after 5 business days please send [place] a MyChart message or call the office at [phone], so we may follow up on this for you.   Your physician has recommended that you wear a Zio heart monitor.   This monitor is a medical device that records the heart's electrical activity. Doctors most often use these monitors to diagnose arrhythmias. Arrhythmias are problems with the speed or rhythm of the heartbeat. The monitor is a small device applied to your chest. You can wear one while you do your normal  daily activities. While wearing this monitor if you have any symptoms to push the button and record what you felt. Once you have worn this monitor for the period of time provider prescribed (Usually 14 days), you will return the monitor device in the postage paid box. Once it is returned they will download the data collected and provide [place] with a report which the provider will then review and we will call you with those results. Important tips:  Avoid showering during the first 24 hours of wearing the  monitor. Avoid excessive sweating to help maximize wear time. Do not submerge the device, no hot tubs, and no swimming pools. Keep any lotions or oils away from the patch. After 24 hours you may shower with the patch on. Take brief showers with your back facing the shower head.  Do not remove patch once it has been placed because that will interrupt data and decrease adhesive wear time. Push the button when you have any symptoms and write down what you were feeling. Once you have completed wearing your monitor, remove and place into box which has postage paid and place in your outgoing mailbox.  If for some reason you have misplaced your box then call our office and we can provide another box and/or mail it off for you.      Follow-Up: At [facility], you and your health needs are our priority.  As part of our continuing mission to provide you with exceptional heart care, we have created designated Provider Care Teams.  These Care Teams include your primary Cardiologist (physician) and Advanced Practice Providers (APPs -  Physician Assistants and Nurse Practitioners) who all work together to provide you with the care you need, when you need it.  We recommend signing up for the patient portal called "MyChart".  Sign up information is provided on this After Visit Summary.  MyChart is used to connect with patients for Virtual Visits (Telemedicine).  Patients are able to view lab/test results, encounter notes, upcoming appointments, etc.  Non-urgent messages can be sent to your provider as well.   To learn more about what you can do with MyChart, go to [email or web address].    Your next appointment:   08/12/2022 2:20 PM  Provider:   You may see Julien Nordmann, MD or one of the following Advanced Practice Providers on your designated Care Team:   Nicolasa Ducking, NP Eula Listen, PA-C Cadence Fransico Michael, PA-C Charlsie Quest, NP   Other Instructions -None

## 2022-07-18 ENCOUNTER — Other Ambulatory Visit: Payer: Self-pay

## 2022-07-18 ENCOUNTER — Telehealth: Payer: Self-pay | Admitting: Cardiovascular Disease

## 2022-07-18 DIAGNOSIS — Z79899 Other long term (current) drug therapy: Secondary | ICD-10-CM

## 2022-07-18 NOTE — Telephone Encounter (Signed)
Left message to call the clinic. 

## 2022-07-18 NOTE — Telephone Encounter (Signed)
Patient was returning call. Please advise ?

## 2022-07-18 NOTE — Telephone Encounter (Signed)
Patient is returning call.  °

## 2022-07-18 NOTE — Telephone Encounter (Signed)
Returned call to patient and discussed lab results.  Per Gavin Bamberg, NP: [name], Your thyroid function was slightly abnormal, however this could happen with amiodarone use. For now, we will just monitor and continue medications as they are. We will see what the results from your monitor show, and then we will know if we can stop the amiodarone or not. Will repeat your TSH and free T4 in six weeks. De Nurse   Patient will apply heart monitor after his stress test on 07/22/22. [facility] will [facility] in [place] to have repeat TSH and free T4 drawn around 08/29/22. Labs ordered.  Patient verbalized understanding of results and recommendations, expressed appreciation for call.

## 2022-07-22 ENCOUNTER — Encounter
Admission: RE | Admit: 2022-07-22 | Discharge: 2022-07-22 | Disposition: A | Discharge status: Home / Self Care | Payer: PPO | Source: Ambulatory Visit | Attending: Nurse Practitioner | Admitting: Nurse Practitioner

## 2022-07-22 DIAGNOSIS — R079 Chest pain, unspecified: Secondary | ICD-10-CM

## 2022-07-22 LAB — NM MYOCAR MULTI W/SPECT W/WALL MOTION / EF
Nuc Stress EF: 56 %
Percent HR: 56 %
Rest HR: 63 {beats}/min
SRS: 7
Stress Nuclear Isotope Dose: 32.9 mCi

## 2022-07-22 MED ORDER — REGADENOSON 0.4 MG/5ML IV SOLN
0.4000 mg | Freq: Once | INTRAVENOUS | Status: AC
  Administered 2022-07-22: 0.4 mg via INTRAVENOUS

## 2022-07-22 MED ORDER — TECHNETIUM TC 99M TETROFOSMIN IV KIT
32.8500 | PACK | Freq: Once | INTRAVENOUS | Status: AC | PRN
  Administered 2022-07-22: 32.85 via INTRAVENOUS

## 2022-07-22 MED ORDER — TECHNETIUM TC 99M TETROFOSMIN IV KIT
10.0300 | PACK | Freq: Once | INTRAVENOUS | Status: AC | PRN
  Administered 2022-07-22: 10.03 via INTRAVENOUS

## 2022-07-23 LAB — NM MYOCAR MULTI W/SPECT W/WALL MOTION / EF
LV dias vol: 71 mL (ref 62–150)
LV sys vol: 31 mL
Peak HR: 79 {beats}/min
Rest Nuclear Isotope Dose: 10 mCi
SDS: 2
SSS: 8
TID: 1.17

## 2022-07-28 DIAGNOSIS — M5116 Intervertebral disc disorders with radiculopathy, lumbar region: Secondary | ICD-10-CM | POA: Diagnosis not present

## 2022-07-28 DIAGNOSIS — M47816 Spondylosis without myelopathy or radiculopathy, lumbar region: Secondary | ICD-10-CM | POA: Diagnosis not present

## 2022-07-28 DIAGNOSIS — M5416 Radiculopathy, lumbar region: Secondary | ICD-10-CM | POA: Diagnosis not present

## 2022-08-07 DIAGNOSIS — M5416 Radiculopathy, lumbar region: Secondary | ICD-10-CM | POA: Diagnosis not present

## 2022-08-11 NOTE — Progress Notes (Signed)
Cardiology Office Note  Date:  08/12/2022   ID:  Gavin Maxwell, DOB Jul 31, 1945, MRN [id number]  PCP:  Marguarite Arbour, MD   Chief Complaint  Patient presents with   2-3 week follow up     Follow up Zio and Myoview results. Patient wore a Zio monitor x 14 days; mailed to [employer] on 08/06/2022. Medications reviewed by the patient verbally. "Doing well." Patient would like to know if should be taking Aspirin with being on Eliquis.     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 Presenting for follow-up of his SVT, atrial fibrillation  Seen in clinic by myself February 2023 Seen by one of our providers July 16, 2022  Admitted 05/28/2022 to 06/04/2022 after presenting to the ED with a 5-day history of shortness of breath that had been getting progressively worse.  He had recently been evaluated by his PCP and diagnosed with COPD exacerbation, given doxycycline and prednisone without improvement.    new onset A-fib with RVR and was evaluated by cardiology on 06/03/22.  TTE on 05/29/22 revealed and EF of 60-65%, moderate concentric LVH.   TEE 06/05/22 which revealed an EF of 45 to 50%, global hypokinesis, mild MR, grade 3 atheroma plaque involving the aortic arch and descending aorta.   Troponin was elevated peaking at 2196--patient did not report any chest pain-- felt to be related to demand ischemia (not a candidate for MPI d/t body habitus; AKI on CKD prohibited LHC).   successfully cardioverted X1 at 150 J, remaining in sinus rhythm at discharge.  Plans were to continue amiodarone 400 mg twice daily for the next 7 days, then reduce to 400 mg daily.   No further episodes of tachycardia On last clinic visit started on Lasix as needed for leg  swelling  Myoview ordered low risk study echo showed EF 45-50%,   Myoview ordered on last clinic visit to look at A-fib burden We talked to [employer] and they reports monitor was never activated and I have no data  Chronic Back surgery, arthritis, left arm brace, Walks with cane Walking with therapy  Denies any chest pain concerning for angina Denies shortness of breath  CR 1.7 BUN 35 Rare lasix , maybe 1 a week  EKG personally reviewed by myself on todays visit  Nsr rate 71 bpm no significant ST or T wave changes  Other past medical history reviewed hospital May 2019 acute shortness of breath primary spontaneous pneumothorax on the right Underwent chest tube insertion, right thoracoscopy and apical blebectomy and talc pleurodesis  Noted to have intermittent episodes of SVT treated with beta-blockers Followed by pulmonary  Prior CTs personally reviewed by myself  Rare episodes of near syncope, typically when sitting down One day sitting at computer, "almost went out" on 03/2017 Seen by [facility]:  Echo:  NORMAL LEFT VENTRICULAR SYSTOLIC FUNCTION   WITH MILD LVH NORMAL RIGHT VENTRICULAR SYSTOLIC FUNCTION MILD VALVULAR REGURGITATION (See above) NO VALVULAR STENOSIS MILD TR, PR TRIVIAL MR EF 50%  Carotid u/s 04/09/2017 which revealed minimal atherosclerotic plaque bilaterally without hemodynamically significant stenosis.  Seen in [place], back doctor "abn heart rhythm, irregular, did not feel well":  Prior event monitor 24 hours done through [facility] showing rare short episodes atrial tachycardia.  Did not have a " spell"  when he had the monitor in place   PMH:   has a past medical history of Anxiety, Cancer (HCC), CKD (chronic kidney disease) stage 3, GFR 30-59 ml/min (HCC), COPD (chronic obstructive pulmonary disease) (HCC), Depression, Dysrhythmia, GERD (gastroesophageal reflux disease), History of hiatal hernia, Hypertension, Pneumonia, Primary localized  osteoarthritis of knee (09/26/2014), Primary localized osteoarthrosis, shoulder region, rotator cuff arthropathy (10/04/2013), and Sleep apnea.  PSH:    Past Surgical History:  Procedure Laterality Date   CARDIAC CATHETERIZATION     20 yrs. ago   CARDIOVERSION N/A 06/03/2022   Procedure: CARDIOVERSION;  Surgeon: Antonieta Iba, MD;  Location: [facility] ORS;  Service: Cardiovascular;  Laterality: N/A;   CHOLECYSTECTOMY     EYE SURGERY Left    pt states he was "seeing double" and they did surgery to fix it   NASAL SINUS SURGERY     x2   REVERSE SHOULDER ARTHROPLASTY Right 10/04/2013   Procedure: REVERSE SHOULDER ARTHROPLASTY;  Surgeon: Eulas Post, MD;  Location: [facility] OR;  Service: Orthopedics;  Laterality: Right;   SHOULDER ACROMIOPLASTY Left    x 5 shoulder surgeries   SVT ABLATION N/A 03/07/2019   Procedure: SVT ABLATION;  Surgeon: Regan Lemming, MD;  Location: [facility];  Service: Cardiovascular;  Laterality: N/A;   TEE WITHOUT CARDIOVERSION N/A 06/03/2022   Procedure: TRANSESOPHAGEAL ECHOCARDIOGRAM;  Surgeon: Antonieta Iba, MD;  Location: [facility] ORS;  Service: Cardiovascular;  Laterality: N/A;   TOTAL HIP ARTHROPLASTY Right 07/26/2019   Procedure: TOTAL HIP ARTHROPLASTY;  Surgeon: Teryl Lucy, MD;  Location: [facility];  Service: Orthopedics;  Laterality: Right;   TOTAL HIP ARTHROPLASTY Left 1998   TOTAL KNEE ARTHROPLASTY Right 09/26/2014   Procedure: RIGHT TOTAL KNEE ARTHROPLASTY;  Surgeon: Teryl Lucy, MD;  Location: [facility];  Service: Orthopedics;  Laterality: Right;   TRANSFORAMINAL LUMBAR INTERBODY FUSION (TLIF) WITH PEDICLE SCREW FIXATION 2 LEVEL Right 06/05/2021   Procedure: RIGHT-SIDED LUMBAR 3- LUMBAR 4, LUMBAR 4- LUMBAR 5 TRANSFORAMINAL LUMBAR INTERBODY FUSION AND DECOMPRESSION WITH INSTRUMENTATION AND ALLOGRAFT;  Surgeon: Estill Bamberg, MD;  Location: [facility] OR;  Service: Orthopedics;  Laterality: Right;   VIDEO ASSISTED THORACOSCOPY (VATS) W/TALC PLEUADESIS Right  08/18/2017   Procedure: VIDEO ASSISTED THORACOSCOPY (VATS)POSSIBLE  W/TALC PLEUADESIS.POSSIBLE BLEBECTOMY;  Surgeon: Hulda Marin, MD;  Location: [facility] ORS;  Service: Thoracic;  Laterality: Right;    Current Outpatient Medications  Medication Sig Dispense Refill   acetaminophen (TYLENOL) 500 MG tablet Take 1,000 mg by mouth 2 (two) times daily as needed for moderate pain.     albuterol (PROVENTIL HFA;VENTOLIN HFA) 108 (90 Base) MCG/ACT inhaler Inhale 2 puffs into the lungs every 6 (six) hours as needed for wheezing or shortness of breath. 1 Inhaler 3   amiodarone (PACERONE) 200 MG tablet Take 1 tablet (200 mg total) by mouth daily. 90 tablet 3   apixaban (ELIQUIS) 5 MG TABS tablet Take 1 tablet (5 mg total) by mouth 2 (two) times daily. 180 tablet 0   atorvastatin (LIPITOR) 80 MG tablet Take 1 tablet by mouth once daily 30 tablet 0   brimonidine-timolol (COMBIGAN) 0.2-0.5 % ophthalmic solution Place 1 drop into both eyes every 12 (twelve) hours.     Calcium Carb-Cholecalciferol (CALCIUM 600 + D PO) Take 1 tablet by mouth daily.     dapagliflozin propanediol (FARXIGA) 10 MG TABS tablet Take 1 tablet (10 mg total) by mouth daily. 90 tablet 0   fenofibrate 160 MG tablet Take 160 mg by mouth daily.  ferrous sulfate 325 (65 FE) MG tablet Take 325 mg by mouth daily.     fluticasone (FLONASE) 50 MCG/ACT nasal spray Place 2 sprays into both nostrils daily.      furosemide (LASIX) 20 MG tablet Take 1 tablet (20 mg total) by mouth daily. 90 tablet 3   furosemide (LASIX) 20 MG tablet Take 20 mg by mouth as needed. For edema     gabapentin (NEURONTIN) 300 MG capsule Take 300 mg by mouth 3 (three) times daily.     imipramine (TOFRANIL) 25 MG tablet Take 25 mg by mouth at bedtime.     LUMIGAN 0.01 % SOLN Place 1 drop into both eyes at bedtime.     magnesium oxide (MAG-OX) 400 MG tablet Take 400 mg by mouth 3 (three) times daily.     metoprolol succinate (TOPROL-XL) 25 MG 24 hr tablet Take 1 tablet (25  mg total) by mouth every evening. 90 tablet 3   Multiple Vitamin (MULTIVITAMIN WITH MINERALS) TABS tablet Take 1 tablet by mouth daily.     pantoprazole (PROTONIX) 40 MG tablet Take 40 mg by mouth daily.      polycarbophil (FIBERCON) 625 MG tablet Take 625 mg by mouth 2 (two) times daily.     sertraline (ZOLOFT) 50 MG tablet Take 50 mg by mouth at bedtime.     No current facility-administered medications for this visit.    Allergies:   Amoxicillin-pot clavulanate, Celecoxib, and Oxycodone   Social History:  The patient  reports that he quit smoking about 34 years ago. His smoking use included cigarettes. He has a 40.00 pack-year smoking history. He has never used smokeless tobacco. He reports that he does not drink alcohol and does not use drugs.   Family History:   family history includes Breast cancer in his mother; Cancer in his father; Hypertension in his mother; Lung cancer in his brother; Rheum arthritis in his father; Stroke in his paternal grandfather.   Review of Systems: Review of Systems  Constitutional: Negative.   HENT: Negative.    Respiratory: Negative.    Cardiovascular: Negative.   Gastrointestinal: Negative.   Musculoskeletal: Negative.   Neurological: Negative.   Psychiatric/Behavioral: Negative.    All other systems reviewed and are negative.   PHYSICAL EXAM: VS:  BP 130/62 (BP Location: Left Arm, Patient Position: Sitting, Cuff Size: Normal)   Pulse 71   Ht 5\' 11"  (1.803 m)   Wt 216 lb 6 oz (98.1 kg)   SpO2 96%   BMI 30.18 kg/m  , BMI Body mass index is 30.18 kg/m. Constitutional:  oriented to person, place, and time. No distress.  HENT:  Head: Grossly normal Eyes:  no discharge. No scleral icterus.  Neck: No JVD, no carotid bruits  Cardiovascular: Regular rate and rhythm, no murmurs appreciated Pulmonary/Chest: Clear to auscultation bilaterally, no wheezes or rails Abdominal: Soft.  no distension.  no tenderness.  Musculoskeletal: Normal range of  motion Neurological:  normal muscle tone. Coordination normal. No atrophy Skin: Skin warm and dry Psychiatric: normal affect, pleasant  Recent Labs: 05/28/2022: B Natriuretic Peptide 215.1 05/29/2022: ALT 50 06/01/2022: Hemoglobin 10.2; Platelets 255 06/04/2022: BUN 47; Creatinine, Ser 1.69; Magnesium 1.9; Potassium 5.3; Sodium 132 07/16/2022: TSH 7.597    Lipid Panel Lab Results  Component Value Date   CHOL 69 05/29/2022   HDL 11 (L) 05/29/2022   LDLCALC 27 05/29/2022   TRIG 154 (H) 05/29/2022      Wt Readings from Last 3 Encounters:  08/12/22  216 lb 6 oz (98.1 kg)  07/16/22 217 lb (98.4 kg)  07/15/22 220 lb 12.8 oz (100.2 kg)     ASSESSMENT AND PLAN:  Problem List Items Addressed This Visit       Cardiology Problems   Essential hypertension   Relevant Medications   furosemide (LASIX) 20 MG tablet   Hyperlipidemia   Relevant Medications   furosemide (LASIX) 20 MG tablet   Other Relevant Orders   EKG 12-Lead   SVT (supraventricular tachycardia)   Relevant Medications   furosemide (LASIX) 20 MG tablet     Other   CKD (chronic kidney disease) stage 3, GFR 30-59 ml/min (HCC)   Other Visit Diagnoses     Coronary artery disease involving native heart without angina pectoris, unspecified vessel or lesion type    -  Primary   Relevant Medications   furosemide (LASIX) 20 MG tablet   Other Relevant Orders   EKG 12-Lead   Paroxysmal atrial fibrillation (HCC)       Relevant Medications   furosemide (LASIX) 20 MG tablet   Other Relevant Orders   EKG 12-Lead   Chest pain, unspecified type       Relevant Orders   EKG 12-Lead   HFrEF (heart failure with reduced ejection fraction) (HCC)       Relevant Medications   furosemide (LASIX) 20 MG tablet     SVT Prior ablation, no recurrent episodes  Essential hypertension Blood pressure is well controlled on today's visit. No changes made to the medications.  Hyperlipidemia Cholesterol is at goal on the current lipid  regimen. No changes to the medications were made.  Paroxysmal atrial fibrillation Maintaining normal sinus rhythm, continue amiodarone, metoprolol, Eliquis Stop aspirin  Chronic kidney disease Recommend he avoid NSAIDs  Cardiomyopathy In the setting of atrial fibrillation Continue current medications Rare use of Lasix, appears euvolemic Myoview low risk   Total encounter time more than 40 minutes  Greater than 50% was spent in counseling and coordination of care with the patient    Signed, Dossie Arbour, M.D., Ph.D. [facility] [place], [place] [phone]

## 2022-08-12 ENCOUNTER — Other Ambulatory Visit: Payer: Self-pay | Admitting: Cardiovascular Disease

## 2022-08-12 ENCOUNTER — Telehealth: Payer: Self-pay | Admitting: Nurse Practitioner

## 2022-08-12 ENCOUNTER — Ambulatory Visit: Payer: PPO | Attending: Cardiovascular Disease | Admitting: Cardiovascular Disease

## 2022-08-12 ENCOUNTER — Encounter: Payer: Self-pay | Admitting: Cardiovascular Disease

## 2022-08-12 VITALS — BP 130/62 | HR 71 | Ht 71.0 in | Wt 216.4 lb

## 2022-08-12 DIAGNOSIS — I5020 Unspecified systolic (congestive) heart failure: Secondary | ICD-10-CM | POA: Diagnosis not present

## 2022-08-12 DIAGNOSIS — I480 Paroxysmal atrial fibrillation: Secondary | ICD-10-CM | POA: Diagnosis not present

## 2022-08-12 DIAGNOSIS — N1832 Chronic kidney disease, stage 3b: Secondary | ICD-10-CM

## 2022-08-12 DIAGNOSIS — I471 Supraventricular tachycardia, unspecified: Secondary | ICD-10-CM | POA: Diagnosis not present

## 2022-08-12 DIAGNOSIS — R079 Chest pain, unspecified: Secondary | ICD-10-CM

## 2022-08-12 DIAGNOSIS — E782 Mixed hyperlipidemia: Secondary | ICD-10-CM

## 2022-08-12 DIAGNOSIS — I2510 Atherosclerotic heart disease of native coronary artery without angina pectoris: Secondary | ICD-10-CM

## 2022-08-12 DIAGNOSIS — I10 Essential (primary) hypertension: Secondary | ICD-10-CM

## 2022-08-12 NOTE — Telephone Encounter (Signed)
Email notification received from [employer].  Monitor Status: No Data Available (Device Not Activated).    The patient was seen in the office on 07/16/22 by Wallis Bamberg, NP Ward Givens, NP). ZIO XT ordered to be worn for 14 days for A-fib Burden.   Will forward to Ward Givens, NP's nurse as an FYI/ for follow up.

## 2022-08-12 NOTE — Patient Instructions (Addendum)
Your physician recommends that you continue on your current medications as directed. Please refer to the Current Medication list given to you today.  The Zio monitor will be cancelled.  Medication Instructions:  Stop aspirin  If you need a refill on your cardiac medications before your next appointment, please call your pharmacy.   Lab work: No new labs needed  Testing/Procedures: No new testing needed  Follow-Up: At [facility], you and your health needs are our priority.  As part of our continuing mission to provide you with exceptional heart care, we have created designated Provider Care Teams.  These Care Teams include your primary Cardiologist (physician) and Advanced Practice Providers (APPs -  Physician Assistants and Nurse Practitioners) who all work together to provide you with the care you need, when you need it.  You will need a follow up appointment in 6 months  Providers on your designated Care Team:   Nicolasa Ducking, NP Eula Listen, PA-C Cadence Fransico Michael, [place]  COVID-19 Vaccine Information can be found at: [email or web address] For questions related to vaccine distribution or appointments, please email [email or web address] or call [phone].

## 2022-08-14 DIAGNOSIS — I214 Non-ST elevation (NSTEMI) myocardial infarction: Secondary | ICD-10-CM | POA: Diagnosis not present

## 2022-08-14 DIAGNOSIS — Z7901 Long term (current) use of anticoagulants: Secondary | ICD-10-CM | POA: Diagnosis not present

## 2022-08-14 DIAGNOSIS — D509 Iron deficiency anemia, unspecified: Secondary | ICD-10-CM | POA: Diagnosis not present

## 2022-08-14 DIAGNOSIS — I5031 Acute diastolic (congestive) heart failure: Secondary | ICD-10-CM | POA: Diagnosis not present

## 2022-08-14 DIAGNOSIS — F418 Other specified anxiety disorders: Secondary | ICD-10-CM | POA: Diagnosis not present

## 2022-08-14 DIAGNOSIS — J441 Chronic obstructive pulmonary disease with (acute) exacerbation: Secondary | ICD-10-CM | POA: Diagnosis not present

## 2022-08-14 DIAGNOSIS — I4891 Unspecified atrial fibrillation: Secondary | ICD-10-CM | POA: Diagnosis not present

## 2022-08-14 DIAGNOSIS — C8590 Non-Hodgkin lymphoma, unspecified, unspecified site: Secondary | ICD-10-CM | POA: Diagnosis not present

## 2022-08-14 DIAGNOSIS — N1831 Chronic kidney disease, stage 3a: Secondary | ICD-10-CM | POA: Diagnosis not present

## 2022-08-14 DIAGNOSIS — I130 Hypertensive heart and chronic kidney disease with heart failure and stage 1 through stage 4 chronic kidney disease, or unspecified chronic kidney disease: Secondary | ICD-10-CM | POA: Diagnosis not present

## 2022-08-15 NOTE — Telephone Encounter (Signed)
The patient stated that this was discussed at his office visit with Dr. Mariah Milling on 5/7. The patient stated that another monitor was not needed at this time.   The patient stated that the monitor turned green and he wore it for two weeks.

## 2022-08-23 ENCOUNTER — Other Ambulatory Visit: Payer: Self-pay | Admitting: Cardiovascular Disease

## 2022-08-29 ENCOUNTER — Emergency Department
Admission: EM | Admit: 2022-08-29 | Discharge: 2022-08-29 | Disposition: A | Discharge status: Home / Self Care | Payer: PPO | Attending: Emergency Medicine | Admitting: Emergency Medicine

## 2022-08-29 ENCOUNTER — Encounter: Payer: Self-pay | Admitting: Emergency Medicine

## 2022-08-29 ENCOUNTER — Emergency Department: Payer: PPO

## 2022-08-29 ENCOUNTER — Other Ambulatory Visit: Payer: Self-pay

## 2022-08-29 ENCOUNTER — Telehealth: Payer: Self-pay | Admitting: Cardiovascular Disease

## 2022-08-29 DIAGNOSIS — J449 Chronic obstructive pulmonary disease, unspecified: Secondary | ICD-10-CM | POA: Insufficient documentation

## 2022-08-29 DIAGNOSIS — S51012A Laceration without foreign body of left elbow, initial encounter: Secondary | ICD-10-CM | POA: Diagnosis not present

## 2022-08-29 DIAGNOSIS — W0110XA Fall on same level from slipping, tripping and stumbling with subsequent striking against unspecified object, initial encounter: Secondary | ICD-10-CM | POA: Diagnosis not present

## 2022-08-29 DIAGNOSIS — I130 Hypertensive heart and chronic kidney disease with heart failure and stage 1 through stage 4 chronic kidney disease, or unspecified chronic kidney disease: Secondary | ICD-10-CM | POA: Insufficient documentation

## 2022-08-29 DIAGNOSIS — M5416 Radiculopathy, lumbar region: Secondary | ICD-10-CM | POA: Diagnosis not present

## 2022-08-29 DIAGNOSIS — S0990XA Unspecified injury of head, initial encounter: Secondary | ICD-10-CM | POA: Diagnosis not present

## 2022-08-29 DIAGNOSIS — N189 Chronic kidney disease, unspecified: Secondary | ICD-10-CM | POA: Diagnosis not present

## 2022-08-29 DIAGNOSIS — Z23 Encounter for immunization: Secondary | ICD-10-CM | POA: Diagnosis not present

## 2022-08-29 DIAGNOSIS — M4312 Spondylolisthesis, cervical region: Secondary | ICD-10-CM | POA: Diagnosis not present

## 2022-08-29 DIAGNOSIS — I2510 Atherosclerotic heart disease of native coronary artery without angina pectoris: Secondary | ICD-10-CM | POA: Diagnosis not present

## 2022-08-29 DIAGNOSIS — I509 Heart failure, unspecified: Secondary | ICD-10-CM | POA: Insufficient documentation

## 2022-08-29 DIAGNOSIS — Z7901 Long term (current) use of anticoagulants: Secondary | ICD-10-CM | POA: Insufficient documentation

## 2022-08-29 DIAGNOSIS — W19XXXA Unspecified fall, initial encounter: Secondary | ICD-10-CM

## 2022-08-29 MED ORDER — TETANUS-DIPHTH-ACELL PERTUSSIS 5-2.5-18.5 LF-MCG/0.5 IM SUSY
0.5000 mL | PREFILLED_SYRINGE | Freq: Once | INTRAMUSCULAR | Status: AC
  Administered 2022-08-29: 0.5 mL via INTRAMUSCULAR
  Filled 2022-08-29: qty 0.5

## 2022-08-29 NOTE — Telephone Encounter (Signed)
Patient had a fall and had a CT scan and lab work this morning. He would like to know if Dr. Mariah Milling can review labs and provide any feedback.

## 2022-08-29 NOTE — ED Provider Notes (Signed)
[facility] Provider Note    Event Date/Time   First MD Initiated Contact with Patient 08/29/22 1107     (approximate)   History   Chief Complaint Fall   HPI  Gavin Maxwell is a 77 y.o. male with past medical history of hypertension, hyperlipidemia, CAD, CHF, atrial fibrillation on Eliquis, COPD, and CKD who presents to the ED following fall.  Patient reports that he was at the hospital for routine blood work and while standing at the registration desk, turned to leave and lost his balance.  He reports falling to the ground and striking his head as well as his left shoulder.  He denies losing consciousness, currently denies significant headache or neck pain.  He reports multiple skin tears to his left upper arm along with some discomfort in the arm.  He reports a chronic deformity in his left shoulder due to non-Hodgkin's lymphoma and pathologic fracture.  He was previously told that he cannot have surgery to repair this.  He states that he is able to move his left arm as usual.  He denies any chest pain, abdominal pain, or lower extremity pain.     Physical Exam   Triage Vital Signs: ED Triage Vitals [08/29/22 1030]  Enc Vitals Group     BP 113/73     Pulse Rate 67     Resp 17     Temp 98.1 F (36.7 C)     Temp Source Oral     SpO2 100 %     Weight 216 lb (98 kg)     Height 5\' 11"  (1.803 m)     Head Circumference      Peak Flow      Pain Score      Pain Loc      Pain Edu?      Excl. in GC?     Most recent vital signs: Vitals:   08/29/22 1030  BP: 113/73  Pulse: 67  Resp: 17  Temp: 98.1 F (36.7 C)  SpO2: 100%    Constitutional: Alert and oriented. Eyes: Conjunctivae are normal. Head: Atraumatic. Nose: No congestion/rhinnorhea. Mouth/Throat: Mucous membranes are moist.  Neck: Midline cervical spine tenderness to palpation. Cardiovascular: Normal rate, regular rhythm. Grossly normal heart sounds.  2+ radial pulses  bilaterally. Respiratory: Normal respiratory effort.  No retractions. Lungs CTAB.  No chest wall tenderness to palpation. Gastrointestinal: Soft and nontender. No distention. Musculoskeletal: Chronic deformity to left shoulder with no associated tenderness to palpation and no change in range of motion.  Skin tear overlying left upper arm and left elbow.  No elbow tenderness to palpation or limitation in range of motion.  No tenderness to palpation at left wrist.  No right upper extremity tenderness to palpation.  No lower extremity tenderness nor edema.  Neurologic:  Normal speech and language. No gross focal neurologic deficits are appreciated.    ED Results / Procedures / Treatments   Labs (all labs ordered are listed, but only abnormal results are displayed) Labs Reviewed - No data to display   RADIOLOGY CT head reviewed and interpreted by me with no hemorrhage or midline shift.  PROCEDURES:  Critical Care performed: No  Procedures   MEDICATIONS ORDERED IN ED: Medications  Tdap (BOOSTRIX) injection 0.5 mL (0.5 mLs Intramuscular Given 08/29/22 1131)     IMPRESSION / MDM / ASSESSMENT AND PLAN / ED COURSE  I reviewed the triage vital signs and the nursing notes.  77 y.o. male with past medical history of hypertension, hyperlipidemia, CAD, CHF, atrial fibrillation on Eliquis, COPD, and CKD who presents to the ED following fall where he lost his balance attempting to turn and walk, striking his head and left arm.  Patient's presentation is most consistent with acute complicated illness / injury requiring diagnostic workup.  Differential diagnosis includes, but is not limited to, intracranial injury, cervical spine injury, shoulder injury, elbow injury, skin tear.  Patient well-appearing and in no acute distress, vital signs are unremarkable.  He has no significant signs of trauma to his head or neck area but does have midline tenderness of his  cervical spine.  Given he is anticoagulated, we will check CT head and cervical spine.  He has skin tears to his left upper arm, no apparent lacerations in need of repair.  We will clean wounds and apply nonadherent dressing, he was offered x-ray imaging of his shoulder, but declines.  He states his range of motion is at his baseline with minimal pain and it seems reasonable to hold off on imaging at this time.  CT head and cervical spine are negative for acute process, patient continues to feel well on reassessment.  He is appropriate for outpatient management and was counseled to follow-up with his PCP, otherwise return to the ED for new or worsening symptoms.  Patient agrees with plan.      FINAL CLINICAL IMPRESSION(S) / ED DIAGNOSES   Final diagnoses:  Fall, initial encounter  Injury of head, initial encounter  Skin tear of left elbow without complication, initial encounter     Rx / DC Orders   ED Discharge Orders     None        Note:  This document was prepared using Dragon voice recognition software and may include unintentional dictation errors.   Chesley Noon, MD 08/29/22 1259

## 2022-08-29 NOTE — ED Triage Notes (Signed)
Presents to ED s/p fall  He lost his balance and fell  Has skin tear to left side of face,left upper arm and to right forearm  Pt is currently on Eliquis

## 2022-08-29 NOTE — Progress Notes (Signed)
   08/29/22 1031  Spiritual Encounters  Type of Visit Initial  Care provided to: Pt and family  Referral source Trauma page  Reason for visit Trauma  OnCall Visit Yes  Spiritual Framework  Presenting Themes Impactful experiences and emotions  Community/Connection Family  Interventions  Spiritual Care Interventions Made Compassionate presence;Established relationship of care and support  Intervention Outcomes  Outcomes Connection to spiritual care  Spiritual Care Plan  Spiritual Care Issues Still Outstanding No further spiritual care needs at this time (see row info)  Advance Directives (For Healthcare)  Does Patient Have a Medical Advance Directive? No  Would patient like information on creating a medical advance directive? No - Patient declined  Mental Health Advance Directives  Does Patient Have a Mental Health Advance Directive? No  Would patient like information on creating a mental health advance directive? No - Patient declined   Visited with patient and wife who lost his balance in the main lobby. Informed them that chaplain care is available for support and care

## 2022-09-02 NOTE — Telephone Encounter (Signed)
Spoke to patient and informed him of the provider's recommendations as follows:  "I do not see any lab work in the computer only scanning Would talk to his primary care physician about CT scan neck finding of Moderate spinal canal narrowing at C3-C4."  Patient understood with read back

## 2022-09-03 DIAGNOSIS — I129 Hypertensive chronic kidney disease with stage 1 through stage 4 chronic kidney disease, or unspecified chronic kidney disease: Secondary | ICD-10-CM | POA: Diagnosis not present

## 2022-09-03 DIAGNOSIS — N1832 Chronic kidney disease, stage 3b: Secondary | ICD-10-CM | POA: Diagnosis not present

## 2022-09-03 DIAGNOSIS — E871 Hypo-osmolality and hyponatremia: Secondary | ICD-10-CM | POA: Diagnosis not present

## 2022-09-03 DIAGNOSIS — R600 Localized edema: Secondary | ICD-10-CM | POA: Diagnosis not present

## 2022-09-03 DIAGNOSIS — E875 Hyperkalemia: Secondary | ICD-10-CM | POA: Diagnosis not present

## 2022-09-06 DIAGNOSIS — M545 Low back pain, unspecified: Secondary | ICD-10-CM | POA: Diagnosis not present

## 2022-09-08 ENCOUNTER — Other Ambulatory Visit: Payer: Self-pay | Admitting: Cardiovascular Disease

## 2022-09-08 DIAGNOSIS — R600 Localized edema: Secondary | ICD-10-CM | POA: Diagnosis not present

## 2022-09-08 DIAGNOSIS — I129 Hypertensive chronic kidney disease with stage 1 through stage 4 chronic kidney disease, or unspecified chronic kidney disease: Secondary | ICD-10-CM | POA: Diagnosis not present

## 2022-09-08 DIAGNOSIS — N1832 Chronic kidney disease, stage 3b: Secondary | ICD-10-CM | POA: Diagnosis not present

## 2022-09-13 ENCOUNTER — Other Ambulatory Visit: Payer: Self-pay | Admitting: Cardiovascular Disease

## 2022-09-15 NOTE — Telephone Encounter (Addendum)
Prescription refill request for Eliquis received. Indication: AF Last office visit: 08/12/22  Concha Se MD Scr: 1.93 on 09/03/22 Age: 77 Weight: 98.1kg  Based on above findings Eliquis 5mg  twice daily is the appropriate dose.  Refill approved.  Dose will likely need reducing at his birthday in [birth year].

## 2022-09-15 NOTE — Telephone Encounter (Signed)
Refill request

## 2022-09-19 ENCOUNTER — Ambulatory Visit (HOSPITAL_BASED_OUTPATIENT_CLINIC_OR_DEPARTMENT_OTHER): Payer: PPO

## 2022-09-19 ENCOUNTER — Ambulatory Visit
Admission: RE | Admit: 2022-09-19 | Discharge: 2022-09-19 | Disposition: A | Discharge status: Home / Self Care | Payer: PPO | Source: Ambulatory Visit | Attending: Student in an Organized Health Care Education/Training Program | Admitting: Student in an Organized Health Care Education/Training Program

## 2022-09-19 DIAGNOSIS — R0602 Shortness of breath: Secondary | ICD-10-CM

## 2022-09-19 DIAGNOSIS — J9811 Atelectasis: Secondary | ICD-10-CM | POA: Diagnosis not present

## 2022-09-19 DIAGNOSIS — Z87891 Personal history of nicotine dependence: Secondary | ICD-10-CM | POA: Insufficient documentation

## 2022-09-19 DIAGNOSIS — J479 Bronchiectasis, uncomplicated: Secondary | ICD-10-CM | POA: Diagnosis not present

## 2022-09-19 LAB — PULMONARY FUNCTION TEST ARMC ONLY
DL/VA % pred: 68 %
DL/VA: 2.64 ml/min/mmHg/L
DLCO unc % pred: 53 %
DLCO unc: 13.61 ml/min/mmHg
FEF 25-75 Post: 1.17 L/sec
FEF 25-75 Pre: 1.2 L/sec
FEF2575-%Change-Post: -2 %
FEF2575-%Pred-Post: 55 %
FEF2575-%Pred-Pre: 56 %
FEV1-%Change-Post: 0 %
FEV1-%Pred-Post: 68 %
FEV1-%Pred-Pre: 69 %
FEV1-Post: 2.09 L
FEV1-Pre: 2.1 L
FEV1FVC-%Change-Post: 0 %
FEV1FVC-%Pred-Pre: 96 %
FEV6-%Change-Post: 0 %
FEV6-%Pred-Post: 74 %
FEV6-%Pred-Pre: 75 %
FEV6-Post: 2.96 L
FEV6-Pre: 2.99 L
FEV6FVC-%Change-Post: -1 %
FEV6FVC-%Pred-Post: 104 %
FEV6FVC-%Pred-Pre: 105 %
FVC-%Change-Post: 0 %
FVC-%Pred-Post: 71 %
FVC-%Pred-Pre: 71 %
FVC-Post: 3.04 L
FVC-Pre: 3.02 L
Post FEV1/FVC ratio: 69 %
Post FEV6/FVC ratio: 98 %
Pre FEV1/FVC ratio: 69 %
Pre FEV6/FVC Ratio: 99 %
RV % pred: 116 %
RV: 3.13 L
TLC % pred: 84 %
TLC: 6.15 L

## 2022-09-19 MED ORDER — ALBUTEROL SULFATE (2.5 MG/3ML) 0.083% IN NEBU
2.5000 mg | INHALATION_SOLUTION | Freq: Once | RESPIRATORY_TRACT | Status: AC
  Administered 2022-09-19: 2.5 mg via RESPIRATORY_TRACT
  Filled 2022-09-19: qty 3

## 2022-09-22 ENCOUNTER — Telehealth: Payer: Self-pay

## 2022-09-22 NOTE — Telephone Encounter (Signed)
Received voicemail from PAT--patient would like to schedule PFT. According to patient's chart, he had PFT 09/19/2022. Nothing further needed.

## 2022-10-06 DIAGNOSIS — M5416 Radiculopathy, lumbar region: Secondary | ICD-10-CM | POA: Diagnosis not present

## 2022-10-07 DIAGNOSIS — H6121 Impacted cerumen, right ear: Secondary | ICD-10-CM | POA: Diagnosis not present

## 2022-10-07 DIAGNOSIS — G4733 Obstructive sleep apnea (adult) (pediatric): Secondary | ICD-10-CM | POA: Diagnosis not present

## 2022-10-07 DIAGNOSIS — H903 Sensorineural hearing loss, bilateral: Secondary | ICD-10-CM | POA: Diagnosis not present

## 2022-10-08 DIAGNOSIS — Z79899 Other long term (current) drug therapy: Secondary | ICD-10-CM | POA: Diagnosis not present

## 2022-10-08 DIAGNOSIS — E782 Mixed hyperlipidemia: Secondary | ICD-10-CM | POA: Diagnosis not present

## 2022-10-08 DIAGNOSIS — I10 Essential (primary) hypertension: Secondary | ICD-10-CM | POA: Diagnosis not present

## 2022-10-13 ENCOUNTER — Telehealth: Payer: Self-pay | Admitting: Physical Medicine and Rehabilitation

## 2022-10-13 NOTE — Telephone Encounter (Signed)
Pt called requesting a call back from referral for back stimulator. Please call pt at [phone].

## 2022-10-14 ENCOUNTER — Telehealth: Payer: Self-pay

## 2022-10-14 NOTE — Telephone Encounter (Signed)
Spoke with patient and scheduled OV for 10/21/22.

## 2022-10-14 NOTE — Telephone Encounter (Signed)
-----   Message from Juanda Chance, NP sent at 10/07/2022  4:41 PM EDT ----- OV to discuss spinal cord stimulator trial.  ----- Message ----- From: Sharlet Salina, CMA Sent: 10/07/2022   4:37 PM EDT To: Juanda Chance, NP   ----- Message ----- From: Dorcas Mcmurray Sent: 10/07/2022   4:25 PM EDT To: Sharlet Salina, CMA

## 2022-10-15 DIAGNOSIS — Z79899 Other long term (current) drug therapy: Secondary | ICD-10-CM | POA: Diagnosis not present

## 2022-10-15 DIAGNOSIS — F411 Generalized anxiety disorder: Secondary | ICD-10-CM | POA: Diagnosis not present

## 2022-10-15 DIAGNOSIS — I10 Essential (primary) hypertension: Secondary | ICD-10-CM | POA: Diagnosis not present

## 2022-10-15 DIAGNOSIS — N183 Chronic kidney disease, stage 3 unspecified: Secondary | ICD-10-CM | POA: Diagnosis not present

## 2022-10-15 DIAGNOSIS — I480 Paroxysmal atrial fibrillation: Secondary | ICD-10-CM | POA: Diagnosis not present

## 2022-10-15 DIAGNOSIS — Z125 Encounter for screening for malignant neoplasm of prostate: Secondary | ICD-10-CM | POA: Diagnosis not present

## 2022-10-15 DIAGNOSIS — E782 Mixed hyperlipidemia: Secondary | ICD-10-CM | POA: Diagnosis not present

## 2022-10-17 ENCOUNTER — Encounter: Payer: Self-pay | Admitting: Student in an Organized Health Care Education/Training Program

## 2022-10-17 ENCOUNTER — Ambulatory Visit: Payer: PPO | Admitting: Student in an Organized Health Care Education/Training Program

## 2022-10-17 VITALS — BP 120/80 | HR 65 | Temp 97.6°F | Ht 71.0 in | Wt 222.4 lb

## 2022-10-17 DIAGNOSIS — R0602 Shortness of breath: Secondary | ICD-10-CM | POA: Diagnosis not present

## 2022-10-17 DIAGNOSIS — J439 Emphysema, unspecified: Secondary | ICD-10-CM | POA: Diagnosis not present

## 2022-10-17 MED ORDER — BREZTRI AEROSPHERE 160-9-4.8 MCG/ACT IN AERO: 2.0000 | INHALATION_SPRAY | Freq: Two times a day (BID) | RESPIRATORY_TRACT | 0 refills | Status: DC

## 2022-10-17 NOTE — Progress Notes (Signed)
Synopsis: Referred in *** by Marguarite Arbour, MD  Assessment & Plan:   1. Shortness of breath  Symptoms are resolved, no change since last visit. He did have afib with RVR and symptoms got better after that was controlled. CT chest without interval change, PFT's with obstructive lung disease and COPD. Patient feels asymptomatic at the time of the interview. Discussed options with him, we will proceed with a sample of long acting inhaler for a week and he will let us know if he feels better with it. If he finds symptomatic improvement, I will send a LABA/LAMA inhaler to his pharmacy.   No follow-ups on file.  I spent *** minutes caring for this patient today, including {EM billing:[id number]}  Raechel Chute, MD [facility] 10/17/2022 9:08 AM    End of visit medications:  Meds ordered this encounter  Medications   Budeson-Glycopyrrol-Formoterol (BREZTRI AEROSPHERE) 160-9-4.8 MCG/ACT AERO    Sig: Inhale 2 puffs into the lungs in the morning and at bedtime.    Dispense:  5.9 g    Refill:  0    Order Specific Question:   Lot Number?    Answer:   [id number]    Order Specific Question:   Expiration Date?    Answer:   01/05/2025    Order Specific Question:   Manufacturer?    Answer:   [employer] [[id number]]    Order Specific Question:   Quantity    Answer:   1     Current Outpatient Medications:    acetaminophen (TYLENOL) 500 MG tablet, Take 1,000 mg by mouth 2 (two) times daily as needed for moderate pain., Disp: , Rfl:    albuterol (PROVENTIL HFA;VENTOLIN HFA) 108 (90 Base) MCG/ACT inhaler, Inhale 2 puffs into the lungs every 6 (six) hours as needed for wheezing or shortness of breath., Disp: 1 Inhaler, Rfl: 3   amiodarone (PACERONE) 200 MG tablet, Take 1 tablet (200 mg total) by mouth daily., Disp: 90 tablet, Rfl: 3   apixaban (ELIQUIS) 5 MG TABS tablet, Take 1 tablet by mouth twice daily, Disp: 180 tablet, Rfl: 1   atorvastatin (LIPITOR) 80 MG tablet, Take 1 tablet  by mouth once daily, Disp: 30 tablet, Rfl: 5   brimonidine-timolol (COMBIGAN) 0.2-0.5 % ophthalmic solution, Place 1 drop into both eyes every 12 (twelve) hours., Disp: , Rfl:    Budeson-Glycopyrrol-Formoterol (BREZTRI AEROSPHERE) 160-9-4.8 MCG/ACT AERO, Inhale 2 puffs into the lungs in the morning and at bedtime., Disp: 5.9 g, Rfl: 0   Calcium Carb-Cholecalciferol (CALCIUM 600 + D PO), Take 1 tablet by mouth daily., Disp: , Rfl:    FARXIGA 10 MG TABS tablet, Take 1 tablet by mouth once daily, Disp: 90 tablet, Rfl: 0   fenofibrate 160 MG tablet, Take 160 mg by mouth daily. , Disp: , Rfl:    ferrous sulfate 325 (65 FE) MG tablet, Take 325 mg by mouth daily., Disp: , Rfl:    fluticasone (FLONASE) 50 MCG/ACT nasal spray, Place 2 sprays into both nostrils daily. , Disp: , Rfl:    furosemide (LASIX) 20 MG tablet, Take 1 tablet (20 mg total) by mouth daily., Disp: 90 tablet, Rfl: 3   furosemide (LASIX) 20 MG tablet, Take 20 mg by mouth as needed. For edema, Disp: , Rfl:    gabapentin (NEURONTIN) 300 MG capsule, Take 300 mg by mouth 3 (three) times daily., Disp: , Rfl:    imipramine (TOFRANIL) 25 MG tablet, Take 25 mg by mouth at bedtime., Disp: ,  Rfl:    LUMIGAN 0.01 % SOLN, Place 1 drop into both eyes at bedtime., Disp: , Rfl:    magnesium oxide (MAG-OX) 400 MG tablet, Take 400 mg by mouth 3 (three) times daily., Disp: , Rfl:    metoprolol succinate (TOPROL-XL) 25 MG 24 hr tablet, Take 1 tablet by mouth in the evening, Disp: 90 tablet, Rfl: 0   Multiple Vitamin (MULTIVITAMIN WITH MINERALS) TABS tablet, Take 1 tablet by mouth daily., Disp: , Rfl:    pantoprazole (PROTONIX) 40 MG tablet, Take 40 mg by mouth daily. , Disp: , Rfl:    polycarbophil (FIBERCON) 625 MG tablet, Take 625 mg by mouth 2 (two) times daily., Disp: , Rfl:    sertraline (ZOLOFT) 50 MG tablet, Take 50 mg by mouth at bedtime., Disp: , Rfl:    Subjective:   PATIENT ID: Gavin Maxwell GENDER: male DOB: 07-09-1945, MRN:  [id number]  Chief Complaint  Patient presents with   Follow-up    DOE. No wheezing or cough.    HPI ***  Ancillary information including prior medications, full medical/surgical/family/social histories, and PFTs (when available) are listed below and have been reviewed.   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  Left arm  wears a brace there is a bone broken   CKD (chronic kidney disease) stage 3, GFR 30-59 ml/min (HCC)    COPD (chronic obstructive pulmonary disease) (HCC)    Depression    Dysrhythmia    SVT   GERD (gastroesophageal reflux disease)    History of hiatal hernia    Hypertension    Pneumonia    Primary localized osteoarthritis of knee 09/26/2014   Primary localized osteoarthrosis, shoulder region, rotator cuff arthropathy 10/04/2013   Sleep apnea    cpap     Family History  Problem Relation Age of Onset   Breast cancer Mother    Hypertension Mother    Rheum arthritis Father    Cancer Father    Lung cancer Brother    Stroke Paternal Grandfather      Past Surgical History:  Procedure Laterality Date   CARDIAC CATHETERIZATION     20 yrs. ago   CARDIOVERSION N/A 06/03/2022   Procedure: CARDIOVERSION;  Surgeon: Antonieta Iba, MD;  Location: [facility];  Service: Cardiovascular;  Laterality: N/A;   CHOLECYSTECTOMY     EYE SURGERY Left    pt states he was "seeing double" and they did surgery to fix it    NASAL SINUS SURGERY     x2   REVERSE SHOULDER ARTHROPLASTY Right 10/04/2013   Procedure: REVERSE SHOULDER ARTHROPLASTY;  Surgeon: Eulas Post, MD;  Location: [facility];  Service: Orthopedics;  Laterality: Right;   SHOULDER ACROMIOPLASTY Left    x 5 shoulder  surgeries   SVT ABLATION N/A 03/07/2019   Procedure: SVT ABLATION;  Surgeon: Regan Lemming, MD;  Location: [facility];  Service: Cardiovascular;  Laterality: N/A;   TEE WITHOUT CARDIOVERSION N/A 06/03/2022   Procedure: TRANSESOPHAGEAL ECHOCARDIOGRAM;  Surgeon: Antonieta Iba, MD;  Location: [facility];  Service: Cardiovascular;  Laterality: N/A;   TOTAL HIP ARTHROPLASTY Right 07/26/2019   Procedure: TOTAL HIP ARTHROPLASTY;  Surgeon: Teryl Lucy, MD;  Location: [facility];  Service: Orthopedics;  Laterality: Right;   TOTAL HIP ARTHROPLASTY Left 1998   TOTAL KNEE ARTHROPLASTY Right 09/26/2014   Procedure: RIGHT TOTAL KNEE ARTHROPLASTY;  Surgeon: Teryl Lucy, MD;  Location: [facility];  Service: Orthopedics;  Laterality: Right;   TRANSFORAMINAL LUMBAR INTERBODY FUSION (TLIF) WITH PEDICLE SCREW FIXATION 2 LEVEL Right 06/05/2021   Procedure: RIGHT-SIDED LUMBAR 3- LUMBAR 4, LUMBAR 4- LUMBAR 5 TRANSFORAMINAL LUMBAR INTERBODY FUSION AND DECOMPRESSION WITH INSTRUMENTATION AND ALLOGRAFT;  Surgeon: Estill Bamberg, MD;  Location: [facility];  Service: Orthopedics;  Laterality: Right;   VIDEO ASSISTED THORACOSCOPY (VATS) W/TALC PLEUADESIS Right 08/18/2017   Procedure: VIDEO ASSISTED THORACOSCOPY (VATS)POSSIBLE  W/TALC PLEUADESIS.POSSIBLE BLEBECTOMY;  Surgeon: Hulda Marin, MD;  Location: [facility];  Service: Thoracic;  Laterality: Right;    Social History   Socioeconomic History   Marital status: Married    Spouse name: Not on file   Number of children: 2   Years of education: Not on file   Highest education level: Not on file  Occupational History   Not on file  Tobacco Use   Smoking status: Former    Current packs/day: 0.00    Average  packs/day: 2.0 packs/day for 20.0 years (40.0 ttl pk-yrs)    Types: Cigarettes    Start date: 74    Quit date: 53    Years since quitting: 34.5   Smokeless tobacco: Never   Tobacco comments:    quit 25 years ago  Vaping Use   Vaping status: Never Used  Substance and Sexual Activity   Alcohol use: No   Drug use: No   Sexual activity: Not Currently    Birth control/protection: None  Other Topics Concern   Not on file  Social History Narrative   Not on file   Social Determinants of Health   Financial Resource Strain: Not on file  Food Insecurity: No Food Insecurity (05/28/2022)   Hunger Vital Sign    Worried About Running Out of Food in the Last Year: Never true    Ran Out of Food in the Last Year: Never true  Transportation Needs: No Transportation Needs (05/28/2022)   PRAPARE - Administrator, Civil Service (Medical): No    Lack of Transportation (Non-Medical): No  Physical Activity: Not on file  Stress: Not on file  Social Connections: Not on file  Intimate Partner Violence: Not At Risk (05/28/2022)   Humiliation, Afraid, Rape, and Kick questionnaire    Fear of Current or Ex-Partner: No    Emotionally Abused: No    Physically Abused: No    Sexually Abused: No     Allergies  Allergen Reactions   Amoxicillin-Pot Clavulanate Nausea Only and Other (See Comments)    Did it involve swelling of the face/tongue/throat, SOB, or low BP? No  Did it involve sudden or severe rash/hives, skin peeling, or any reaction on the inside of your mouth or nose? No  Did you need to seek medical attention at a hospital or doctor's office? No  When did it  last happen? More than 10 years  If all above answers are "NO", may proceed with cephalosporin use.  Did it involve swelling of the face/tongue/throat, SOB, or low BP? No Did it involve sudden or severe rash/hives, skin peeling, or any reaction on the inside of your mouth or nose? No Did you need to seek medical attention  at a hospital or doctor's office? No When did it last happen? More than 10 years If all above answers are "NO", may proceed with cephalosporin use.   Celecoxib Nausea Only and Other (See Comments)    Upset stomach   Oxycodone Other (See Comments)    Confusion, makes him "crazy"  Confusion, makes him "crazy"    Confusion, makes him "crazy" Confusion, makes him "crazy"     CBC    Component Value Date/Time   WBC 8.8 06/01/2022 0500   RBC 3.26 (L) 06/01/2022 0500   HGB 10.2 (L) 06/01/2022 0500   HGB 13.6 09/23/2012 1018   HCT 30.6 (L) 06/01/2022 0500   HCT 38.3 (L) 09/23/2012 1018   PLT 255 06/01/2022 0500   PLT 215 09/23/2012 1018   MCV 93.9 06/01/2022 0500   MCV 92 09/23/2012 1018   MCH 31.3 06/01/2022 0500   MCHC 33.3 06/01/2022 0500   RDW 13.4 06/01/2022 0500   RDW 12.8 09/23/2012 1018   LYMPHSABS 0.7 05/28/2022 1009   LYMPHSABS 1.4 09/23/2012 1018   MONOABS 0.8 05/28/2022 1009   MONOABS 0.5 09/23/2012 1018   EOSABS 0.1 05/28/2022 1009   EOSABS 0.3 09/23/2012 1018   BASOSABS 0.0 05/28/2022 1009   BASOSABS 0.1 09/23/2012 1018    Pulmonary Functions Testing Results:    Latest Ref Rng & Units 09/19/2022   10:17 AM  PFT Results  FVC-Pre L 3.02   FVC-Predicted Pre % 71   FVC-Post L 3.04   FVC-Predicted Post % 71   Pre FEV1/FVC % % 69   Post FEV1/FCV % % 69   FEV1-Pre L 2.10   FEV1-Predicted Pre % 69   FEV1-Post L 2.09   DLCO uncorrected ml/min/mmHg 13.61   DLCO UNC% % 53   DLVA Predicted % 68   TLC L 6.15   TLC % Predicted % 84   RV % Predicted % 116     Outpatient Medications Prior to Visit  Medication Sig Dispense Refill   acetaminophen (TYLENOL) 500 MG tablet Take 1,000 mg by mouth 2 (two) times daily as needed for moderate pain.     albuterol (PROVENTIL HFA;VENTOLIN HFA) 108 (90 Base) MCG/ACT inhaler Inhale 2 puffs into the lungs every 6 (six) hours as needed for wheezing or shortness of breath. 1 Inhaler 3   amiodarone (PACERONE) 200 MG tablet Take 1  tablet (200 mg total) by mouth daily. 90 tablet 3   apixaban (ELIQUIS) 5 MG TABS tablet Take 1 tablet by mouth twice daily 180 tablet 1   atorvastatin (LIPITOR) 80 MG tablet Take 1 tablet by mouth once daily 30 tablet 5   brimonidine-timolol (COMBIGAN) 0.2-0.5 % ophthalmic solution Place 1 drop into both eyes every 12 (twelve) hours.     Calcium Carb-Cholecalciferol (CALCIUM 600 + D PO) Take 1 tablet by mouth daily.     FARXIGA 10 MG TABS tablet Take 1 tablet by mouth once daily 90 tablet 0   fenofibrate 160 MG tablet Take 160 mg by mouth daily.      ferrous sulfate 325 (65 FE) MG tablet Take 325 mg by mouth daily.  fluticasone (FLONASE) 50 MCG/ACT nasal spray Place 2 sprays into both nostrils daily.      furosemide (LASIX) 20 MG tablet Take 1 tablet (20 mg total) by mouth daily. 90 tablet 3   furosemide (LASIX) 20 MG tablet Take 20 mg by mouth as needed. For edema     gabapentin (NEURONTIN) 300 MG capsule Take 300 mg by mouth 3 (three) times daily.     imipramine (TOFRANIL) 25 MG tablet Take 25 mg by mouth at bedtime.     LUMIGAN 0.01 % SOLN Place 1 drop into both eyes at bedtime.     magnesium oxide (MAG-OX) 400 MG tablet Take 400 mg by mouth 3 (three) times daily.     metoprolol succinate (TOPROL-XL) 25 MG 24 hr tablet Take 1 tablet by mouth in the evening 90 tablet 0   Multiple Vitamin (MULTIVITAMIN WITH MINERALS) TABS tablet Take 1 tablet by mouth daily.     pantoprazole (PROTONIX) 40 MG tablet Take 40 mg by mouth daily.      polycarbophil (FIBERCON) 625 MG tablet Take 625 mg by mouth 2 (two) times daily.     sertraline (ZOLOFT) 50 MG tablet Take 50 mg by mouth at bedtime.     No facility-administered medications prior to visit.

## 2022-10-21 ENCOUNTER — Ambulatory Visit: Payer: PPO | Admitting: Physical Medicine and Rehabilitation

## 2022-10-21 ENCOUNTER — Telehealth: Payer: Self-pay | Admitting: Cardiovascular Disease

## 2022-10-21 ENCOUNTER — Encounter: Payer: Self-pay | Admitting: Physical Medicine and Rehabilitation

## 2022-10-21 DIAGNOSIS — M961 Postlaminectomy syndrome, not elsewhere classified: Secondary | ICD-10-CM

## 2022-10-21 DIAGNOSIS — M5416 Radiculopathy, lumbar region: Secondary | ICD-10-CM | POA: Diagnosis not present

## 2022-10-21 DIAGNOSIS — M5441 Lumbago with sciatica, right side: Secondary | ICD-10-CM | POA: Diagnosis not present

## 2022-10-21 DIAGNOSIS — M48062 Spinal stenosis, lumbar region with neurogenic claudication: Secondary | ICD-10-CM | POA: Diagnosis not present

## 2022-10-21 DIAGNOSIS — G8929 Other chronic pain: Secondary | ICD-10-CM

## 2022-10-21 DIAGNOSIS — R269 Unspecified abnormalities of gait and mobility: Secondary | ICD-10-CM

## 2022-10-21 NOTE — Telephone Encounter (Signed)
    Primary Cardiologist: Julien Nordmann, MD  Chart reviewed as part of pre-operative protocol coverage. Simple dental extractions are considered low risk procedures per guidelines and generally do not require any specific cardiac clearance. It is also generally accepted that for simple extractions and dental cleanings, there is no need to interrupt blood thinner therapy.   SBE prophylaxis is not required for the patient.  I will route this recommendation to the requesting party via Epic fax function and remove from pre-op pool.  Please call with questions.  Ronney Asters, NP 10/21/2022, 1:27 PM

## 2022-10-21 NOTE — Progress Notes (Unsigned)
Gavin Maxwell - 77 y.o. male MRN [id number]  Date of birth: 16-Oct-1945  Office Visit Note: Visit Date: 10/21/2022 PCP: Marguarite Arbour, MD Referred by: Marguarite Arbour, MD  Subjective: Chief Complaint  Patient presents with   Lower Back - Pain   HPI: Gavin Maxwell is a 77 y.o. male who comes in today Per the request of Dr. Romero Belling for evaluation of chronic, worsening and severe right sided lower back pain radiating to buttock and down lateral leg to foot. Pain ongoing for several years. Pain seems to be more severe in the morning after waking up, does ease off some with walking. He describes pain as sore, aching and burning sensation, currently rates as 8 out of 10. Some relief of pain with home exercise regimen, rest and use of medications. Recent lumbar MRI imaging exhibits PLIF L3-L4 and L4-L5, biforaminal impingement and moderate spinal canal stenosis at L1-L2 and L2-L3. Prior PLIF by Dr. Estill Bamberg in March of 2023. Patient has undergone multiple lumbar epidural steroid injections by Dr. Romero Belling, transient benefit with these injections, last injection on 08/07/2022. Dr. Maurice Small recommended patient for possible spinal cord stimulator as patient is not interested in continuing interventional procedures and does not wish to undergo surgery. Patient reports history of multiple falls and balance issues. He does try and stay active, does use tractor multiple times a week and enjoys working outside. Patient currently using cane to assist with ambulation. Patient denies focal weakness, numbness and tingling.   Of note, does have history of atrial fibrillation, managed by his cardiology Dr. Julien Nordmann, currently taking Eliquis.    Review of Systems  Musculoskeletal:  Positive for back pain.  Neurological:  Negative for tingling, sensory change, focal weakness and weakness.  All other systems reviewed and are negative.  Otherwise per HPI.  Assessment & Plan: Visit  Diagnoses:    ICD-10-CM   1. Lumbar radiculopathy  M54.16     2. Chronic right-sided low back pain with right-sided sciatica  M54.41    G89.29     3. Spinal stenosis of lumbar region with neurogenic claudication  M48.062     4. Gait abnormality  R26.9     5. Post laminectomy syndrome  M96.1        Plan: Findings:  Chronic, worsening and severe right sided lower back pain radiating to buttock and down lateral leg to foot. Patient continues to have severe pain despite good conservative therapies such as home exercise regimen, rest and use of medications. Patients clinical presentation and exam are consistent with L5 nerve pattern. I discussed treatment plan in detail today. We feel patient is a good candidate for spinal cord stimulator as he does have history of post laminectomy syndrome, multiple lumbar epidural steroid injections with minimal relief of pain and has failed conservative therapies/medication management. Dr. Alvester Morin and myself spent a great deal of time today discussing SCS and trial process with patient and wife today using imaging and models. I also provided patient with educational material regarding SCS for patient to take home and review. We also discussed implant process and referral to surgeon/anesthesiologist. I also voiced need for neuropsychological evaluation prior to trial. I will arrange for [facility] with [facility] to speak with patient to answer any further questions/concerns. Patient does take Eliquis, we will speak with cardiology as he will need permission to come off this medication for trial. Patient will let us know if he would like to proceed with SCS  trial. No red flag symptoms noted upon exam today.   Dr. Alvester Morin participated with direct patient care including clinical review, exam when needed and significant portion of diagnostic and treatment plan. I spend more than 45 minutes of face to face time with patient discussing treatment plan and performing  education.      Meds & Orders: No orders of the defined types were placed in this encounter.  No orders of the defined types were placed in this encounter.   Follow-up: Return if symptoms worsen or fail to improve.   Procedures: No procedures performed      Clinical History: See media referral from Dr. Romero Belling for recent lumbar MRI imaging report.   He reports that he quit smoking about 34 years ago. His smoking use included cigarettes. He started smoking about 54 years ago. He has a 40 pack-year smoking history. He has never used smokeless tobacco.  Recent Labs    05/28/22 1518  HGBA1C 5.2    Objective:  VS:  HT:    WT:   BMI:     BP:   HR: bpm  TEMP: ( )  RESP:  Physical Exam Vitals and nursing note reviewed.  HENT:     Head: Normocephalic and atraumatic.     Right Ear: External ear normal.     Left Ear: External ear normal.     Nose: Nose normal.     Mouth/Throat:     Mouth: Mucous membranes are moist.  Eyes:     Extraocular Movements: Extraocular movements intact.  Cardiovascular:     Rate and Rhythm: Normal rate.     Pulses: Normal pulses.  Pulmonary:     Effort: Pulmonary effort is normal.  Abdominal:     General: Abdomen is flat. There is no distension.  Musculoskeletal:        General: Tenderness present.     Cervical back: Normal range of motion.     Comments: Patient is slow to rise from seated position to standing. Good lumbar range of motion. No pain noted with facet loading. 5/5 strength noted with bilateral hip flexion, knee flexion/extension, ankle dorsiflexion/plantarflexion and EHL. No clonus noted bilaterally. No pain upon palpation of greater trochanters. No pain with internal/external rotation of bilateral hips. Sensation intact bilaterally. Dysesthesias noted to right L5 dermatome. Negative slump test bilaterally. Ambulates with cane, gait slow and unsteady.     Skin:    General: Skin is warm and dry.     Capillary Refill: Capillary  refill takes less than 2 seconds.  Neurological:     Mental Status: He is alert and oriented to person, place, and time.     Gait: Gait abnormal.  Psychiatric:        Mood and Affect: Mood normal.        Behavior: Behavior normal.     Ortho Exam  Imaging: No results found.  Past Medical/Family/Surgical/Social History: Medications & Allergies reviewed per EMR, new medications updated. Patient Active Problem List   Diagnosis Date Noted   NSTEMI (non-ST elevated myocardial infarction) (HCC) 05/28/2022   Atrial fibrillation with rapid ventricular response (HCC) 05/28/2022   Acute diastolic congestive heart failure (HCC) 05/28/2022   COPD with acute exacerbation (HCC) 05/28/2022   Depression with anxiety 05/28/2022   Obesity with body mass index of 30.0-39.9 05/28/2022   Acute renal failure superimposed on chronic kidney disease (HCC) 05/28/2022   Diarrhea 05/28/2022   Abnormal LFTs 05/28/2022   Hyponatremia 05/28/2022   Iron deficiency  anemia 05/28/2022   Radiculopathy 06/05/2021   Osteoarthritis of right hip 07/26/2019   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   Hypomagnesemia 12/27/2018   SVT (supraventricular tachycardia) 12/13/2018   GERD (gastroesophageal reflux disease) 08/24/2017   Hyperlipidemia 08/24/2017   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HCC) 08/24/2017   Primary spontaneous pneumothorax    Pneumothorax on right 08/11/2017   Essential hypertension 04/14/2017   Neurocardiogenic pre-syncope 03/27/2017   Stiffness of hip joint 01/14/2016   Chronic midline low back pain without sciatica 12/17/2015   CKD (chronic kidney disease) stage 3, GFR 30-59 ml/min (HCC) 12/17/2015   Primary localized osteoarthritis of knee 09/26/2014   Knee osteoarthritis 09/26/2014   Primary localized osteoarthrosis, shoulder region, rotator cuff arthropathy 10/04/2013   Chronic anemia 10/03/2013   Renal insufficiency 10/03/2013   Past Medical History:  Diagnosis Date   Anxiety    Cancer (HCC)     non hodgins  lymphoma   2004 in remission  Left arm  wears a brace there is a bone broken   CKD (chronic kidney disease) stage 3, GFR 30-59 ml/min (HCC)    COPD (chronic obstructive pulmonary disease) (HCC)    Depression    Dysrhythmia    SVT   GERD (gastroesophageal reflux disease)    History of hiatal hernia    Hypertension    Pneumonia    Primary localized osteoarthritis of knee 09/26/2014   Primary localized osteoarthrosis, shoulder region, rotator cuff arthropathy 10/04/2013   Sleep apnea    cpap   Family History  Problem Relation Age of Onset   Breast cancer Mother    Hypertension Mother    Rheum arthritis Father    Cancer Father    Lung cancer Brother    Stroke Paternal Grandfather    Past Surgical History:  Procedure Laterality Date   CARDIAC CATHETERIZATION     20 yrs. ago   CARDIOVERSION N/A 06/03/2022   Procedure: CARDIOVERSION;  Surgeon: Antonieta Iba, MD;  Location: [facility] ORS;  Service: Cardiovascular;  Laterality: N/A;   CHOLECYSTECTOMY     EYE SURGERY Left    pt states he was "seeing double" and they did surgery to fix it   NASAL SINUS SURGERY     x2   REVERSE SHOULDER ARTHROPLASTY Right 10/04/2013   Procedure: REVERSE SHOULDER ARTHROPLASTY;  Surgeon: Eulas Post, MD;  Location: [facility] OR;  Service: Orthopedics;  Laterality: Right;   SHOULDER ACROMIOPLASTY Left    x 5 shoulder surgeries   SVT ABLATION N/A 03/07/2019   Procedure: SVT ABLATION;  Surgeon: Regan Lemming, MD;  Location: [facility] INVASIVE CV [facility];  Service: Cardiovascular;  Laterality: N/A;   TEE WITHOUT CARDIOVERSION N/A 06/03/2022   Procedure: TRANSESOPHAGEAL ECHOCARDIOGRAM;  Surgeon: Antonieta Iba, MD;  Location: [facility] ORS;  Service: Cardiovascular;  Laterality: N/A;   TOTAL HIP ARTHROPLASTY Right 07/26/2019   Procedure: TOTAL HIP ARTHROPLASTY;  Surgeon: Teryl Lucy, MD;  Location: [facility];  Service: Orthopedics;  Laterality: Right;   TOTAL HIP ARTHROPLASTY Left 1998   TOTAL KNEE  ARTHROPLASTY Right 09/26/2014   Procedure: RIGHT TOTAL KNEE ARTHROPLASTY;  Surgeon: Teryl Lucy, MD;  Location: [facility] OR;  Service: Orthopedics;  Laterality: Right;   TRANSFORAMINAL LUMBAR INTERBODY FUSION (TLIF) WITH PEDICLE SCREW FIXATION 2 LEVEL Right 06/05/2021   Procedure: RIGHT-SIDED LUMBAR 3- LUMBAR 4, LUMBAR 4- LUMBAR 5 TRANSFORAMINAL LUMBAR INTERBODY FUSION AND DECOMPRESSION WITH INSTRUMENTATION AND ALLOGRAFT;  Surgeon: Estill Bamberg, MD;  Location: [facility] OR;  Service: Orthopedics;  Laterality: Right;   VIDEO ASSISTED  THORACOSCOPY (VATS) W/TALC PLEUADESIS Right 08/18/2017   Procedure: VIDEO ASSISTED THORACOSCOPY (VATS)POSSIBLE  W/TALC PLEUADESIS.POSSIBLE BLEBECTOMY;  Surgeon: Hulda Marin, MD;  Location: [facility] ORS;  Service: Thoracic;  Laterality: Right;   Social History   Occupational History   Not on file  Tobacco Use   Smoking status: Former    Current packs/day: 0.00    Average packs/day: 2.0 packs/day for 20.0 years (40.0 ttl pk-yrs)    Types: Cigarettes    Start date: 69    Quit date: 51    Years since quitting: 34.5   Smokeless tobacco: Never   Tobacco comments:    quit 25 years ago  Vaping Use   Vaping status: Never Used  Substance and Sexual Activity   Alcohol use: No   Drug use: No   Sexual activity: Not Currently    Birth control/protection: None

## 2022-10-21 NOTE — Telephone Encounter (Signed)
   Pre-operative Risk Assessment    Patient Name: Gavin Maxwell  DOB: September 28, 1945 MRN: [id number]     Request for Surgical Clearance    Procedure:  Dental Extraction - Amount of Teeth to be Pulled:  1  Date of Surgery:  Clearance TBD                                 Surgeon:  Dr. Aquilla Hacker  Surgeon's Group or Practice Name:  [facility]  Phone number:  [phone] Fax number:  [phone]    Type of Clearance Requested:   - Medical  - Pharmacy:  Hold Apixaban (Eliquis)     Type of Anesthesia:  Local    Additional requests/questions:    Gavin Maxwell   10/21/2022, 9:41 AM

## 2022-10-21 NOTE — Progress Notes (Unsigned)
Functional Pain Scale - descriptive words and definitions  Moderate (4)   Constantly aware of pain, can complete ADLs with modification/sleep marginally affected at times/passive distraction is of no use, but active distraction gives some relief. Moderate range order  Average Pain 4  Patient ambulates with a walker.  Here to discuss SCS.  States having back pain now that radiates down to his right leg.  Right leg hurts.  Takes Tylenol for the pain

## 2022-10-23 ENCOUNTER — Other Ambulatory Visit: Payer: Self-pay | Admitting: Physical Medicine and Rehabilitation

## 2022-10-23 DIAGNOSIS — M5416 Radiculopathy, lumbar region: Secondary | ICD-10-CM

## 2022-10-23 DIAGNOSIS — M48062 Spinal stenosis, lumbar region with neurogenic claudication: Secondary | ICD-10-CM

## 2022-11-06 DIAGNOSIS — F4542 Pain disorder with related psychological factors: Secondary | ICD-10-CM | POA: Diagnosis not present

## 2022-11-12 ENCOUNTER — Telehealth: Payer: Self-pay | Admitting: Physical Medicine and Rehabilitation

## 2022-11-12 NOTE — Telephone Encounter (Signed)
Informed patient that the authorization has to be submitted to insurance before he can be scheduled for an injection. Verbalized understanding

## 2022-11-12 NOTE — Telephone Encounter (Signed)
Patient called about he supposed to stimulator. His paperwork has been mailed in to get approved from [employer] and now he wants to know when can he get it.[id number]

## 2022-11-16 ENCOUNTER — Other Ambulatory Visit: Payer: Self-pay | Admitting: Cardiovascular Disease

## 2022-11-17 DIAGNOSIS — H2513 Age-related nuclear cataract, bilateral: Secondary | ICD-10-CM | POA: Diagnosis not present

## 2022-11-17 DIAGNOSIS — H409 Unspecified glaucoma: Secondary | ICD-10-CM | POA: Diagnosis not present

## 2022-11-17 DIAGNOSIS — H401133 Primary open-angle glaucoma, bilateral, severe stage: Secondary | ICD-10-CM | POA: Diagnosis not present

## 2022-11-18 ENCOUNTER — Telehealth: Payer: Self-pay

## 2022-11-18 NOTE — Telephone Encounter (Signed)
Submitted SCS trial to insurance on [employer] portal

## 2022-12-02 ENCOUNTER — Telehealth: Payer: Self-pay

## 2022-12-02 DIAGNOSIS — M5416 Radiculopathy, lumbar region: Secondary | ICD-10-CM

## 2022-12-02 NOTE — Telephone Encounter (Signed)
Referral placed.

## 2022-12-03 ENCOUNTER — Telehealth: Payer: Self-pay | Admitting: *Deleted

## 2022-12-03 NOTE — Telephone Encounter (Signed)
   Pre-operative Risk Assessment    Patient Name: Gavin Maxwell  DOB: 1945/12/20 MRN: [id number]    DATE OF LAST VISIT: 08/12/22 DR. GOLLAN DATE OF NEXT VISIT: NONE  Request for Surgical Clearance    Procedure:   SPINE STIMULATOR TRIAL  Date of Surgery:  Clearance TBD                                 Surgeon:  DR. Jerel Shepherd Group or Practice Name:  [facility] Phone number:  [phone] Fax number:  [phone] ATTN: DR. Richardine Service   Type of Clearance Requested:   - Medical  - Pharmacy:  Hold Apixaban (Eliquis) x 2 DAYS PRIOR   Type of Anesthesia:  Not Indicated   Additional requests/questions:    [name]   12/03/2022, 5:54 PM

## 2022-12-04 ENCOUNTER — Telehealth: Payer: Self-pay

## 2022-12-04 NOTE — Telephone Encounter (Signed)
Patient with diagnosis of afib on Eliquis for anticoagulation.    Procedure: spine stimulator trial Date of procedure: not scheduled   CHA2DS2-VASc Score = 5  This indicates a 7.2% annual risk of stroke. The patient's score is based upon: CHF History: 1 HTN History: 1 Diabetes History: 0 Stroke History: 0 Vascular Disease History: 1 Age Score: 2 Gender Score: 0   CrCl 34 ml/min using adjusted body weight Platelet count 211   Per office protocol, patient can hold Eliquis for 3 days prior to procedure.     **This guidance is not considered finalized until pre-operative APP has relayed final recommendations.**

## 2022-12-04 NOTE — Telephone Encounter (Signed)
Pt is scheduled for 9/9 at 2:40pm for tele preop. Med rec and consent done

## 2022-12-04 NOTE — Telephone Encounter (Signed)
   Name: Gavin Maxwell  DOB: 08-Sep-1945  MRN: [id number]  Primary Cardiologist: Julien Nordmann, MD   Preoperative team, please contact this patient and set up a phone call appointment for further preoperative risk assessment. Please obtain consent and complete medication review. Thank you for your help.  I confirm that guidance regarding antiplatelet and oral anticoagulation therapy has been completed and, if necessary, noted below.  Per office protocol, patient can hold Eliquis for 3 days prior to procedure.  Please resume Eliquis as soon as possible postprocedure, at the discretion of the surgeon.    Joylene Grapes, NP 12/04/2022, 3:56 PM [facility]

## 2022-12-04 NOTE — Telephone Encounter (Signed)
Pt is scheduled for 9/9 at 2:40pm for tele preop. Med rec and consent done    Patient Consent for Virtual Visit        Gavin Maxwell has provided verbal consent on 12/04/2022 for a virtual visit (video or telephone).   CONSENT FOR VIRTUAL VISIT FOR:  Gavin Maxwell  By participating in this virtual visit I agree to the following:  I hereby voluntarily request, consent and authorize [facility] and its employed or contracted physicians, physician assistants, nurse practitioners or other licensed health care professionals (the Practitioner), to provide me with telemedicine health care services (the "Services") as deemed necessary by the treating Practitioner. I acknowledge and consent to receive the Services by the Practitioner via telemedicine. I understand that the telemedicine visit will involve communicating with the Practitioner through live audiovisual communication technology and the disclosure of certain medical information by electronic transmission. I acknowledge that I have been given the opportunity to request an in-person assessment or other available alternative prior to the telemedicine visit and am voluntarily participating in the telemedicine visit.  I understand that I have the right to withhold or withdraw my consent to the use of telemedicine in the course of my care at any time, without affecting my right to future care or treatment, and that the Practitioner or I may terminate the telemedicine visit at any time. I understand that I have the right to inspect all information obtained and/or recorded in the course of the telemedicine visit and may receive copies of available information for a reasonable fee.  I understand that some of the potential risks of receiving the Services via telemedicine include:  Delay or interruption in medical evaluation due to technological equipment failure or disruption; Information transmitted may not be sufficient (e.g. poor resolution  of images) to allow for appropriate medical decision making by the Practitioner; and/or  In rare instances, security protocols could fail, causing a breach of personal health information.  Furthermore, I acknowledge that it is my responsibility to provide information about my medical history, conditions and care that is complete and accurate to the best of my ability. I acknowledge that Practitioner's advice, recommendations, and/or decision may be based on factors not within their control, such as incomplete or inaccurate data provided by me or distortions of diagnostic images or specimens that may result from electronic transmissions. I understand that the practice of medicine is not an exact science and that Practitioner makes no warranties or guarantees regarding treatment outcomes. I acknowledge that a copy of this consent can be made available to me via my patient portal [facility] MyChart), or I can request a printed copy by calling the office of [facility].    I understand that my insurance will be billed for this visit.   I have read or had this consent read to me. I understand the contents of this consent, which adequately explains the benefits and risks of the Services being provided via telemedicine.  I have been provided ample opportunity to ask questions regarding this consent and the Services and have had my questions answered to my satisfaction. I give my informed consent for the services to be provided through the use of telemedicine in my medical care

## 2022-12-09 ENCOUNTER — Telehealth: Payer: Self-pay | Admitting: Cardiovascular Disease

## 2022-12-09 NOTE — Telephone Encounter (Signed)
Advised patient of Gavin Maxwell's preop recommendations below. Patient verbalized understanding. No further questions at this time.    Per office protocol, patient can hold Eliquis for 3 days prior to procedure.  Please resume Eliquis as soon as possible postprocedure, at the discretion of the surgeon.      Joylene Grapes, NP 12/04/2022, 3:56 PM [facility]

## 2022-12-09 NOTE — Telephone Encounter (Signed)
Pt would like to know when he needs to stop taking his Eliquis before the procedure and when he can start it back. Please advise

## 2022-12-14 NOTE — Progress Notes (Unsigned)
Virtual Visit via Telephone Note   Because of Gavin Maxwell's co-morbid illnesses, he is at least at moderate risk for complications without adequate follow up.  This format is felt to be most appropriate for this patient at this time.  The patient did not have access to video technology/had technical difficulties with video requiring transitioning to audio format only (telephone).  All issues noted in this document were discussed and addressed.  No physical exam could be performed with this format.  Please refer to the patient's chart for his consent to telehealth for [facility].  Evaluation Performed:  Preoperative cardiovascular risk assessment _____________   Date:  12/15/2022   Patient ID:  Gavin Maxwell, DOB 1945-05-03, MRN [id number] Patient Location:  Home Provider location:   Office  Primary Care Provider:  Marguarite Arbour, MD Primary Cardiologist:  Julien Nordmann, MD  Chief Complaint / Patient Profile   77 y.o. 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   He is pending  SPINE STIMULATOR TRIAL by Dr. Franchot [facility] at [facility] on 12/25/2022 and presents today for telephonic preoperative cardiovascular risk assessment.I spoke with Gavin Maxwell who is his wife.   History of Present Illness    Gavin Maxwell is a 77 y.o. male who presents via audio/video conferencing for a telehealth visit today.  Pt was last seen in cardiology clinic on 08/12/2022 by Dr.Gollan.  At that time Gavin Maxwell was doing well.  The patient is now pending procedure as outlined above. Since his last visit, he is not very active due to chronic back pain and leg pain.  He has not complained about any racing heart rate, palpitations, excessive  bleeding, dizziness, or shortness of breath.  She states that he did not participate in physical therapy after his last back surgery and therefore his stamina is not very good and therefore he has become somewhat sedentary although he does walk up and down their driveway and do low-level yard work.  Past Medical History    Past Medical History:  Diagnosis Date   Anxiety    Cancer (HCC)    non hodgins  lymphoma   2004 in remission  Left arm  wears a brace there is a bone broken   CKD (chronic kidney disease) stage 3, GFR 30-59 ml/min (HCC)    COPD (chronic obstructive pulmonary disease) (HCC)    Depression    Dysrhythmia    SVT   GERD (gastroesophageal reflux disease)    History of hiatal hernia    Hypertension    Pneumonia    Primary localized osteoarthritis of knee 09/26/2014   Primary localized osteoarthrosis, shoulder region, rotator cuff arthropathy 10/04/2013   Sleep apnea    cpap   Past Surgical History:  Procedure Laterality Date   CARDIAC CATHETERIZATION     20 yrs. ago   CARDIOVERSION N/A 06/03/2022   Procedure: CARDIOVERSION;  Surgeon: Antonieta Iba, MD;  Location: [facility] ORS;  Service: Cardiovascular;  Laterality: N/A;   CHOLECYSTECTOMY     EYE SURGERY Left    pt states he was "seeing double" and they did surgery to fix it   NASAL SINUS SURGERY     x2   REVERSE SHOULDER ARTHROPLASTY Right 10/04/2013   Procedure: REVERSE SHOULDER ARTHROPLASTY;  Surgeon: Ivin Booty  Robie Ridge, MD;  Location: [facility] OR;  Service: Orthopedics;  Laterality: Right;   SHOULDER ACROMIOPLASTY Left    x 5 shoulder surgeries   SVT ABLATION N/A 03/07/2019   Procedure: SVT ABLATION;  Surgeon: Regan Lemming, MD;  Location: [facility] INVASIVE CV LAB;  Service: Cardiovascular;  Laterality: N/A;   TEE WITHOUT CARDIOVERSION N/A 06/03/2022   Procedure: TRANSESOPHAGEAL ECHOCARDIOGRAM;  Surgeon: Antonieta Iba, MD;  Location: [facility] ORS;  Service: Cardiovascular;  Laterality: N/A;   TOTAL HIP ARTHROPLASTY  Right 07/26/2019   Procedure: TOTAL HIP ARTHROPLASTY;  Surgeon: Teryl Lucy, MD;  Location: [facility] ORS;  Service: Orthopedics;  Laterality: Right;   TOTAL HIP ARTHROPLASTY Left 1998   TOTAL KNEE ARTHROPLASTY Right 09/26/2014   Procedure: RIGHT TOTAL KNEE ARTHROPLASTY;  Surgeon: Teryl Lucy, MD;  Location: [facility] OR;  Service: Orthopedics;  Laterality: Right;   TRANSFORAMINAL LUMBAR INTERBODY FUSION (TLIF) WITH PEDICLE SCREW FIXATION 2 LEVEL Right 06/05/2021   Procedure: RIGHT-SIDED LUMBAR 3- LUMBAR 4, LUMBAR 4- LUMBAR 5 TRANSFORAMINAL LUMBAR INTERBODY FUSION AND DECOMPRESSION WITH INSTRUMENTATION AND ALLOGRAFT;  Surgeon: Estill Bamberg, MD;  Location: [facility] OR;  Service: Orthopedics;  Laterality: Right;   VIDEO ASSISTED THORACOSCOPY (VATS) W/TALC PLEUADESIS Right 08/18/2017   Procedure: VIDEO ASSISTED THORACOSCOPY (VATS)POSSIBLE  W/TALC PLEUADESIS.POSSIBLE BLEBECTOMY;  Surgeon: Hulda Marin, MD;  Location: [facility] ORS;  Service: Thoracic;  Laterality: Right;    Allergies  Allergies  Allergen Reactions   Amoxicillin-Pot Clavulanate Nausea Only and Other (See Comments)    Did it involve swelling of the face/tongue/throat, SOB, or low BP? No  Did it involve sudden or severe rash/hives, skin peeling, or any reaction on the inside of your mouth or nose? No  Did you need to seek medical attention at a hospital or doctor's office? No  When did it last happen? More than 10 years  If all above answers are "NO", may proceed with cephalosporin use.  Did it involve swelling of the face/tongue/throat, SOB, or low BP? No Did it involve sudden or severe rash/hives, skin peeling, or any reaction on the inside of your mouth or nose? No Did you need to seek medical attention at a hospital or doctor's office? No When did it last happen? More than 10 years If all above answers are "NO", may proceed with cephalosporin use.   Celecoxib Nausea Only and Other (See Comments)    Upset stomach   Oxycodone Other (See  Comments)    Confusion, makes him "crazy"  Confusion, makes him "crazy"    Confusion, makes him "crazy" Confusion, makes him "crazy"    Home Medications    Prior to Admission medications   Medication Sig Start Date End Date Taking? Authorizing Provider  acetaminophen (TYLENOL) 500 MG tablet Take 1,000 mg by mouth 2 (two) times daily as needed for moderate pain.    [provider]  albuterol (PROVENTIL HFA;VENTOLIN HFA) 108 (90 Base) MCG/ACT inhaler Inhale 2 puffs into the lungs every 6 (six) hours as needed for wheezing or shortness of breath. 11/03/17   Merwyn Katos, MD  amiodarone (PACERONE) 200 MG tablet Take 1 tablet (200 mg total) by mouth daily. 06/27/22   Antonieta Iba, MD  apixaban (ELIQUIS) 5 MG TABS tablet Take 1 tablet by mouth twice daily 09/15/22   Antonieta Iba, MD  atorvastatin (LIPITOR) 80 MG tablet Take 1 tablet by mouth once daily 09/08/22   Antonieta Iba, MD  brimonidine-timolol (COMBIGAN) 0.2-0.5 % ophthalmic solution Place 1 drop into both eyes  every 12 (twelve) hours.    [provider]  Budeson-Glycopyrrol-Formoterol (BREZTRI AEROSPHERE) 160-9-4.8 MCG/ACT AERO Inhale 2 puffs into the lungs in the morning and at bedtime. 10/17/22   Raechel Chute, MD  Calcium Carb-Cholecalciferol (CALCIUM 600 + D PO) Take 1 tablet by mouth daily.    [provider]  FARXIGA 10 MG TABS tablet Take 1 tablet by mouth once daily 09/15/22   Antonieta Iba, MD  fenofibrate 160 MG tablet Take 160 mg by mouth daily.     [provider]  ferrous sulfate 325 (65 FE) MG tablet Take 325 mg by mouth daily.    [provider]  fluticasone (FLONASE) 50 MCG/ACT nasal spray Place 2 sprays into both nostrils daily.     [provider]  furosemide (LASIX) 20 MG tablet Take 1 tablet (20 mg total) by mouth daily. 07/16/22   Creig Hines, NP  furosemide (LASIX) 20 MG tablet Take 20 mg by mouth as needed. For edema    [provider]  gabapentin (NEURONTIN) 300 MG capsule Take 300 mg by mouth 3 (three) times daily.    [provider]  imipramine (TOFRANIL) 25 MG tablet Take 25 mg by mouth at bedtime.    [provider]  LUMIGAN 0.01 % SOLN Place 1 drop into both eyes at bedtime.    [provider]  magnesium oxide (MAG-OX) 400 MG tablet Take 400 mg by mouth 3 (three) times daily. 01/23/19   [provider]  metoprolol succinate (TOPROL-XL) 25 MG 24 hr tablet Take 1 tablet by mouth in the evening 11/17/22   Antonieta Iba, MD  Multiple Vitamin (MULTIVITAMIN WITH MINERALS) TABS tablet Take 1 tablet by mouth daily.    [provider]  pantoprazole (PROTONIX) 40 MG tablet Take 40 mg by mouth daily.     [provider]  polycarbophil (FIBERCON) 625 MG tablet Take 625 mg by mouth 2 (two) times daily.    [provider]  sertraline (ZOLOFT) 50 MG tablet Take 50 mg by mouth at bedtime.    [provider]    Physical Exam    Vital Signs:  Gavin Maxwell does not have vital signs available for review today.   Given telephonic nature of communication, physical exam is limited. AAOx3. NAD. Normal affect.  Speech and respirations are unlabored.  Accessory Clinical Findings    None  Assessment & Plan    1.  Preoperative Cardiovascular Risk Assessment:  According to the Revised Cardiac Risk Index (RCRI), his Perioperative Risk of Major Cardiac Event is (%): 0.4  His Functional Capacity in METs is: 5.93 according to the [facility] Activity Status Index (DASI).  He has not been very active since back surgery in March 2024. Did not participate in PT and continues to have back pain.  Per office protocol, patient can hold Eliquis for 3 days prior to procedure.    The patient was advised that if he develops new symptoms prior to surgery to contact our office to arrange for a follow-up visit, and he verbalized understanding.  Therefore, based on  ACC/AHA guidelines, patient would be at acceptable risk for the planned procedure without further cardiovascular testing. I will route this recommendation to the requesting party via Epic fax function.   A copy of this note will be routed to requesting surgeon.  Time:   Today, I have spent 10 minutes with the patient with telehealth technology discussing medical history, symptoms, and management plan.  Joni Reining, NP  12/15/2022, 2:43 PM

## 2022-12-15 ENCOUNTER — Ambulatory Visit: Payer: PPO | Attending: Cardiovascular Disease

## 2022-12-15 DIAGNOSIS — Z01818 Encounter for other preprocedural examination: Secondary | ICD-10-CM

## 2022-12-15 DIAGNOSIS — Z01810 Encounter for preprocedural cardiovascular examination: Secondary | ICD-10-CM

## 2022-12-18 ENCOUNTER — Other Ambulatory Visit: Payer: Self-pay | Admitting: Cardiovascular Disease

## 2022-12-19 ENCOUNTER — Other Ambulatory Visit: Payer: Self-pay

## 2022-12-19 ENCOUNTER — Emergency Department
Admission: EM | Admit: 2022-12-19 | Discharge: 2022-12-19 | Disposition: A | Discharge status: Home / Self Care | Payer: PPO | Attending: Emergency Medicine | Admitting: Emergency Medicine

## 2022-12-19 ENCOUNTER — Emergency Department: Payer: PPO

## 2022-12-19 DIAGNOSIS — Z7901 Long term (current) use of anticoagulants: Secondary | ICD-10-CM | POA: Insufficient documentation

## 2022-12-19 DIAGNOSIS — N189 Chronic kidney disease, unspecified: Secondary | ICD-10-CM | POA: Diagnosis not present

## 2022-12-19 DIAGNOSIS — G4489 Other headache syndrome: Secondary | ICD-10-CM | POA: Diagnosis not present

## 2022-12-19 DIAGNOSIS — R58 Hemorrhage, not elsewhere classified: Secondary | ICD-10-CM | POA: Diagnosis not present

## 2022-12-19 DIAGNOSIS — I6529 Occlusion and stenosis of unspecified carotid artery: Secondary | ICD-10-CM | POA: Diagnosis not present

## 2022-12-19 DIAGNOSIS — I129 Hypertensive chronic kidney disease with stage 1 through stage 4 chronic kidney disease, or unspecified chronic kidney disease: Secondary | ICD-10-CM | POA: Diagnosis not present

## 2022-12-19 DIAGNOSIS — S0993XA Unspecified injury of face, initial encounter: Secondary | ICD-10-CM | POA: Diagnosis not present

## 2022-12-19 DIAGNOSIS — J449 Chronic obstructive pulmonary disease, unspecified: Secondary | ICD-10-CM | POA: Diagnosis not present

## 2022-12-19 DIAGNOSIS — S0990XA Unspecified injury of head, initial encounter: Secondary | ICD-10-CM | POA: Diagnosis not present

## 2022-12-19 DIAGNOSIS — S0003XA Contusion of scalp, initial encounter: Secondary | ICD-10-CM | POA: Diagnosis not present

## 2022-12-19 DIAGNOSIS — S0083XA Contusion of other part of head, initial encounter: Secondary | ICD-10-CM | POA: Insufficient documentation

## 2022-12-19 DIAGNOSIS — W19XXXA Unspecified fall, initial encounter: Secondary | ICD-10-CM | POA: Insufficient documentation

## 2022-12-19 DIAGNOSIS — S0081XA Abrasion of other part of head, initial encounter: Secondary | ICD-10-CM | POA: Diagnosis not present

## 2022-12-19 DIAGNOSIS — S199XXA Unspecified injury of neck, initial encounter: Secondary | ICD-10-CM | POA: Diagnosis not present

## 2022-12-19 MED ORDER — ACETAMINOPHEN 500 MG PO TABS
1000.0000 mg | ORAL_TABLET | ORAL | Status: AC
  Administered 2022-12-19: 1000 mg via ORAL
  Filled 2022-12-19: qty 2

## 2022-12-19 NOTE — ED Triage Notes (Signed)
Pt arrived via EMS. Per EMS, pt tripped and lost his balance as he was walking to fast and he started to fall. Pt has a hematoma to the left forehead. Pt also has two skin tears on the side of his left face. Pt is on eliquis.

## 2022-12-19 NOTE — ED Provider Notes (Signed)
[facility] Provider Note    Event Date/Time   First MD Initiated Contact with Patient 12/19/22 1009     (approximate)   History   Fall   HPI  Gavin Maxwell is a 77 y.o. 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  He got home and reports that he normally has his cane with him but he just walked down the hallway without it and so doing he reports that he stumbled forward causing him to fall.  He struck the left side of his head and is fairly swollen.  Did not lose consciousness no other concerns but reports when asked directly if he has any neck pain he reports just a little bit of maybe a slight achiness.  No chest pain no shortness of breath no other injuries moves all extremities well.  Chronic use of a left upper extremity splint due to previous injury of the left upper extremity unchanged       Physical Exam   Triage Vital Signs: ED Triage Vitals [12/19/22 1007]  Encounter Vitals Group     BP      Systolic BP Percentile      Diastolic BP Percentile      Pulse      Resp      Temp      Temp src      SpO2      Weight 220 lb 7.4 oz (100 kg)     Height 5\' 4"  (1.626 m)     Head Circumference      Peak Flow      Pain Score 7     Pain Loc      Pain Education      Exclude from Growth Chart     Most recent vital signs: Vitals:   12/19/22 1021  BP: (!) 159/89  Pulse: 87  Resp: 17  Temp: 98.7 F (37.1 C)  SpO2: 100%     General: Awake, no distress.  Normocephalic atraumatic with exception to moderate-sized hematoma overlying the left forehead without extension into the orbit.  Small abrasion and slight skin tabor over the left maxilla bleeding controlled, no laceration.  Additionally small abrasion over the left helix of the ear without laceration.  No midline cervical  tenderness noted.  Patient does report his neck does feel just a little bit achy though CV:  Good peripheral perfusion.  Normal tones Resp:  Normal effort.  Clear bilateral normal work of breathingAbd:  No distention.  Soft nontender Other:  Demonstrates good range of motion of all extremities without deficit with exception to his left upper extremity which has limited range of motion but he reports that to be chronic in nature.  Warm well-perfused in all extremities no evidence of injury to the extremities or hips   ED Results / Procedures / Treatments   Labs (all labs ordered are listed, but only abnormal results are displayed) Labs Reviewed - No data to display   EKG     RADIOLOGY  CT head interpreted by me as negative for acute intracranial hemorrhage  CT Head Wo Contrast  Result Date: 12/19/2022 CLINICAL DATA:  Facial trauma, blunt. Hematoma L forehead; Facial trauma, blunt. EXAM: CT HEAD WITHOUT CONTRAST CT CERVICAL SPINE WITHOUT CONTRAST TECHNIQUE: Multidetector CT imaging of the head and cervical  spine was performed following the standard protocol without intravenous contrast. Multiplanar CT image reconstructions of the cervical spine were also generated. RADIATION DOSE REDUCTION: This exam was performed according to the departmental dose-optimization program which includes automated exposure control, adjustment of the mA and/or kV according to patient size and/or use of iterative reconstruction technique. COMPARISON:  CT head and cervical spine 08/29/2022. FINDINGS: CT HEAD FINDINGS Brain: No acute intracranial hemorrhage. Gray-white differentiation is preserved. No hydrocephalus or extra-axial collection. No mass effect or midline shift. Vascular: No hyperdense vessel or unexpected calcification. Skull: No calvarial fracture or suspicious bone lesion. Skull base is unremarkable. Sinuses/Orbits: No acute finding. Other: Large left frontal scalp hematoma. CT CERVICAL SPINE FINDINGS  Alignment: No traumatic malalignment. Skull base and vertebrae: No acute fracture. Normal craniocervical junction. No suspicious bone lesions. Soft tissues and spinal canal: No prevertebral fluid or swelling. No visible canal hematoma. Disc levels: Multilevel cervical spondylosis, worst at C3-4, where there is at least mild spinal canal stenosis. Upper chest: No acute findings. Other: Atherosclerotic calcifications of the carotid bulbs. IMPRESSION: 1. No acute intracranial abnormality. Large left frontal scalp hematoma. 2. No acute cervical spine fracture or traumatic malalignment. Electronically Signed   By: Orvan Falconer M.D.   On: 12/19/2022 11:32   CT Cervical Spine Wo Contrast  Result Date: 12/19/2022 CLINICAL DATA:  Facial trauma, blunt. Hematoma L forehead; Facial trauma, blunt. EXAM: CT HEAD WITHOUT CONTRAST CT CERVICAL SPINE WITHOUT CONTRAST TECHNIQUE: Multidetector CT imaging of the head and cervical spine was performed following the standard protocol without intravenous contrast. Multiplanar CT image reconstructions of the cervical spine were also generated. RADIATION DOSE REDUCTION: This exam was performed according to the departmental dose-optimization program which includes automated exposure control, adjustment of the mA and/or kV according to patient size and/or use of iterative reconstruction technique. COMPARISON:  CT head and cervical spine 08/29/2022. FINDINGS: CT HEAD FINDINGS Brain: No acute intracranial hemorrhage. Gray-white differentiation is preserved. No hydrocephalus or extra-axial collection. No mass effect or midline shift. Vascular: No hyperdense vessel or unexpected calcification. Skull: No calvarial fracture or suspicious bone lesion. Skull base is unremarkable. Sinuses/Orbits: No acute finding. Other: Large left frontal scalp hematoma. CT CERVICAL SPINE FINDINGS Alignment: No traumatic malalignment. Skull base and vertebrae: No acute fracture. Normal craniocervical junction.  No suspicious bone lesions. Soft tissues and spinal canal: No prevertebral fluid or swelling. No visible canal hematoma. Disc levels: Multilevel cervical spondylosis, worst at C3-4, where there is at least mild spinal canal stenosis. Upper chest: No acute findings. Other: Atherosclerotic calcifications of the carotid bulbs. IMPRESSION: 1. No acute intracranial abnormality. Large left frontal scalp hematoma. 2. No acute cervical spine fracture or traumatic malalignment. Electronically Signed   By: Orvan Falconer M.D.   On: 12/19/2022 11:32       PROCEDURES:  Critical Care performed: No  Procedures   MEDICATIONS ORDERED IN ED: Medications  acetaminophen (TYLENOL) tablet 1,000 mg (1,000 mg Oral Given 12/19/22 1044)     IMPRESSION / MDM / ASSESSMENT AND PLAN / ED COURSE  I reviewed the triage vital signs and the nursing notes.                              Differential diagnosis includes, but is not limited to, injuries suffered from mechanical fall, rule out intracranial hemorrhage, evaluate for skull fracture, cervical injury etc.  Patient reports in his normal health.  Typically uses his cane, did not do  so today he reports that he had a simple stumble forward causing him to fall without loss of consciousness.  He is on anticoagulant.  His mental status is normal.  He has no obvious neurologic deficits.    Patient's presentation is most consistent with acute presentation with potential threat to life or bodily function.      Clinical Course as of 12/19/22 1147  Fri Dec 19, 2022  1114 CT head interpreted by me as negative for acute gross intracranial hemorrhage. Await radiology report [MQ]    Clinical Course User Index [MQ] Sharyn Creamer, MD   ----------------------------------------- 11:47 AM on 12/19/2022 ----------------------------------------- Patient resting comfortably no complaints or concerns.  His family at the bedside as well.  Current the reviewed careful return  precautions with the patient.  Nursing to use light soap and water to cleanse abrasions to the face and bandage.  Return precautions and treatment recommendations and follow-up discussed with the patient who is agreeable with the plan.   FINAL CLINICAL IMPRESSION(S) / ED DIAGNOSES   Final diagnoses:  Traumatic hematoma of forehead, initial encounter  Abrasion, face w/o infection     Rx / DC Orders   ED Discharge Orders     None        Note:  This document was prepared using Dragon voice recognition software and may include unintentional dictation errors.   Sharyn Creamer, MD 12/19/22 1147

## 2022-12-25 ENCOUNTER — Ambulatory Visit: Payer: PPO | Admitting: Physical Medicine and Rehabilitation

## 2022-12-25 ENCOUNTER — Other Ambulatory Visit: Payer: Self-pay

## 2022-12-25 DIAGNOSIS — M961 Postlaminectomy syndrome, not elsewhere classified: Secondary | ICD-10-CM

## 2022-12-25 DIAGNOSIS — G894 Chronic pain syndrome: Secondary | ICD-10-CM | POA: Diagnosis not present

## 2022-12-25 DIAGNOSIS — M5416 Radiculopathy, lumbar region: Secondary | ICD-10-CM | POA: Diagnosis not present

## 2022-12-25 MED ORDER — LIDOCAINE HCL (PF) 1 % IJ SOLN
2.0000 mL | Freq: Once | INTRAMUSCULAR | Status: AC
Start: 2022-12-25 — End: 2022-12-25
  Administered 2022-12-25: 2 mL

## 2022-12-26 MED ORDER — CEPHALEXIN 500 MG PO CAPS: 500.0000 mg | ORAL_CAPSULE | Freq: Two times a day (BID) | ORAL | 0 refills | Status: DC

## 2022-12-26 NOTE — Procedures (Signed)
Spinal Cord Stimulator Trial  Patient: Gavin Maxwell      Date of Birth: [birth year] MRN: [id number] PCP: Marguarite Arbour, MD      Visit Date: 12/25/2022   Universal Protocol:    Date/Time: 09/20/248:18 AM  Consent Given By: the patient  Position: PRONE  Additional Comments: Vital signs were monitored before and after the procedure. Patient was prepped and draped in the usual sterile fashion. The correct patient, procedure, and site was verified.   Injection Procedure Details:  Procedure Site One Meds Administered:  Meds ordered this encounter  Medications   lidocaine (PF) (XYLOCAINE) 1 % injection 2 mL   cephALEXin (KEFLEX) 500 MG capsule    Sig: Take 1 capsule (500 mg total) by mouth 2 (two) times daily.    Dispense:  14 capsule    Refill:  0     Location/Site: Leads were placed just to the left and right of midline with the upper most lead at the top of the T7 vertebral body just at the superior endplate and then spanning 3 vertebral bodies with the inferior lead at the bottom of the T9 vertebral body.   Needle size: 14 gauge   Needle type: EPIMED RX Coud Epidural Needle  Needle Placement: T12-L1 Dorsal epidural space  Findings:  -  -Comments: Excellent paresthesia coverage was obtained in all of the areas of the patient's normal pain  Procedure Details: After localization of the T12-L1 epidural space and the overlying soft tissue, the needle was introduced two bodies below this level to produce an adequate angle for epidural penetration. The soft tissue was infiltrated with 2-3 mls. of 1% Lidocaine without Epinephrine. Using the posterolateral approach, the #14 gauge Epimed needle was inserted down to the posterior aspect of the L1 lamina and then "walked off" the superior aspect of this lamina into the T12-L1 epidural space. The epidural space was localized with loss of resistance and negative aspirate for CSF or blood. The [employer] (16)  electrode stimulation lead was passed up so that just the superior most lead was at the location noted above while maintaining the lead in the midline.   The above procedure was repeated for a second Administrator, arts [employer] (16) electrode stimulation lead for the opposite side. The pulse generator system was adjusted and programmed by myself and the company technical representative by varying the stimulator sites, rates, pulse amplitude, and duration. Adequate coverage was obtained as described above.  Subsequent to this, the introducer needle was removed and the spinal cord stimulator trial lead was fastened to the skin using steri-strips and strain reduction loop. The lead wire was then connected and Tegaderm was applied to produce an occlusive dressing. The patient was then brought out of the procedure room and was given a portable stimulator.  The patient will follow-up in three to seven days to determine whether this was efficacious.  Oral prophylactic antibiotic, Keflex  was prescribed as noted.   Additional Comments:  No complications occurred Dressing: As noted above    Post-procedure details: Patient was observed during the procedure. Post-procedure instructions were reviewed.  Patient left the clinic in stable condition.

## 2022-12-26 NOTE — Progress Notes (Signed)
Gavin Maxwell - 77 y.o. male MRN [id number]  Date of birth: 1945-08-16  Office Visit Note: Visit Date: 12/25/2022 PCP: Marguarite Arbour, MD Referred by: Romero Belling, MD  Subjective: Chief Complaint  Patient presents with   Other    Here for SCS trial   HPI:  Gavin Maxwell is a 77 y.o. male who comes in today for planned spinal cord stimulator trial.  The patient has failed conservative care including PT/home exercise, medications, time and activity modification.  The patient has chronic, severe and recalcitrant low back pain with neurogenic pain and has failed all other interventional spine procedures.  This represents a procedure of last resort for recalcitrant pain. Pre-procedure psychological evaluation has been completed and procedure pre-authorization has been obtained.  Please see requesting physician  and our notes for further details and justification.   Referring: Romero Belling, MD   ROS Otherwise per HPI.  Assessment & Plan: Visit Diagnoses:    ICD-10-CM   1. Lumbar radiculopathy  M54.16 XR C-ARM NO REPORT    Spinal Cord Stimulator - Analysis    Spinal Cord Stimulator - Placement    lidocaine (PF) (XYLOCAINE) 1 % injection 2 mL    2. Post laminectomy syndrome  M96.1 XR C-ARM NO REPORT    Spinal Cord Stimulator - Analysis    Spinal Cord Stimulator - Placement    lidocaine (PF) (XYLOCAINE) 1 % injection 2 mL    3. Chronic pain syndrome  G89.4 XR C-ARM NO REPORT    Spinal Cord Stimulator - Analysis    Spinal Cord Stimulator - Placement    lidocaine (PF) (XYLOCAINE) 1 % injection 2 mL      Plan: No additional findings.   Meds & Orders:  Meds ordered this encounter  Medications   lidocaine (PF) (XYLOCAINE) 1 % injection 2 mL   cephALEXin (KEFLEX) 500 MG capsule    Sig: Take 1 capsule (500 mg total) by mouth 2 (two) times daily.    Dispense:  14 capsule    Refill:  0    Orders Placed This Encounter  Procedures   Spinal Cord Stimulator - Analysis    Spinal Cord Stimulator - Placement   XR C-ARM NO REPORT    Follow-up: Return in about 4 days (around 12/29/2022) for SCS lead pull.   Procedures: No procedures performed  Spinal Cord Stimulator Trial  Patient: Gavin Maxwell      Date of Birth: 12-28-1945 MRN: [id number] PCP: Marguarite Arbour, MD      Visit Date: 12/25/2022   Universal Protocol:    Date/Time: 09/20/248:18 AM  Consent Given By: the patient  Position: PRONE  Additional Comments: Vital signs were monitored before and after the procedure. Patient was prepped and draped in the usual sterile fashion. The correct patient, procedure, and site was verified.   Injection Procedure Details:  Procedure Site One Meds Administered:  Meds ordered this encounter  Medications   lidocaine (PF) (XYLOCAINE) 1 % injection 2 mL   cephALEXin (KEFLEX) 500 MG capsule    Sig: Take 1 capsule (500 mg total) by mouth 2 (two) times daily.    Dispense:  14 capsule    Refill:  0     Location/Site: Leads were placed just to the left and right of midline with the upper most lead at the top of the T7 vertebral body just at the superior endplate and then spanning 3 vertebral bodies with the inferior lead at the bottom of the  T9 vertebral body.   Needle size: 14 gauge   Needle type: EPIMED RX Coud Epidural Needle  Needle Placement: T12-L1 Dorsal epidural space  Findings:  -  -Comments: Excellent paresthesia coverage was obtained in all of the areas of the patient's normal pain  Procedure Details: After localization of the T12-L1 epidural space and the overlying soft tissue, the needle was introduced two bodies below this level to produce an adequate angle for epidural penetration. The soft tissue was infiltrated with 2-3 mls. of 1% Lidocaine without Epinephrine. Using the posterolateral approach, the #14 gauge Epimed needle was inserted down to the posterior aspect of the L1 lamina and then "walked off" the superior aspect of this  lamina into the T12-L1 epidural space. The epidural space was localized with loss of resistance and negative aspirate for CSF or blood. The [employer] (16) electrode stimulation lead was passed up so that just the superior most lead was at the location noted above while maintaining the lead in the midline.   The above procedure was repeated for a second Administrator, arts [employer] (16) electrode stimulation lead for the opposite side. The pulse generator system was adjusted and programmed by myself and the company technical representative by varying the stimulator sites, rates, pulse amplitude, and duration. Adequate coverage was obtained as described above.  Subsequent to this, the introducer needle was removed and the spinal cord stimulator trial lead was fastened to the skin using steri-strips and strain reduction loop. The lead wire was then connected and Tegaderm was applied to produce an occlusive dressing. The patient was then brought out of the procedure room and was given a portable stimulator.  The patient will follow-up in three to seven days to determine whether this was efficacious.  Oral prophylactic antibiotic, Keflex  was prescribed as noted.   Additional Comments:  No complications occurred Dressing: As noted above    Post-procedure details: Patient was observed during the procedure. Post-procedure instructions were reviewed.  Patient left the clinic in stable condition.      Clinical History: MRI LUMBAR SPINE WITHOUT CONTRAST  TECHNIQUE: Multiplanar, multisequence MR imaging of the lumbar spine was performed. No intravenous contrast was administered.  COMPARISON: 10/14/2021  FINDINGS: Segmentation: 5 lumbar type vertebrae  Alignment: Levoscoliosis. Fused L4-5 and L3-4 anterolisthesis. Mild L2-3 retrolisthesis  Vertebrae: No fracture, evidence of discitis, or bone lesion.  Conus medullaris and cauda equina: Conus extends to the L1-2  level. Conus and cauda equina appear normal.  Paraspinal and other soft tissues: Expected scarring and atrophy of intrinsic back muscles.  Disc levels:  T12- L1: Unremarkable.  L1-L2: Disc collapse and endplate degeneration worse towards the left where there is endplate and facet spurring causing severe foraminal impingement. Moderate to advanced right foraminal impingement is well. Moderate spinal stenosis.  L2-L3: Disc narrowing and bulging with retrolisthesis. Biforaminal protrusion and asymmetric right facet spurring. Advanced right foraminal impingement. Moderate spinal stenosis that is stable or mildly progressed. Moderate left foraminal narrowing  L3-L4: PLIF. Patent appearance of the canal and foramina.  L4-L5: PLIF. Patent appearance of the canal and foramina based on axial images.  L5-S1:Disc bulging and facet spurring. Patent appearance of the canal and foramina.  IMPRESSION: 1. No significant change compared to July 2023. 2. Biforaminal impingement at L1-2 and L2-3, particularly severe on the left at L1-2 and on the right at L2-3. Moderate spinal stenosis at both levels. 3. L3-L5 fusion with patent appearance of the canal and foramina.   Electronically Signed  By: Tiburcio Pea M.D. On: 09/17/2022 20:08     Objective:  VS:  HT:    WT:   BMI:     BP:   HR: bpm  TEMP: ( )  RESP:  Physical Exam Vitals and nursing note reviewed.  Constitutional:      General: He is not in acute distress.    Appearance: Normal appearance. He is not ill-appearing.  HENT:     Head: Normocephalic and atraumatic.     Right Ear: External ear normal.     Left Ear: External ear normal.     Nose: No congestion.  Eyes:     Extraocular Movements: Extraocular movements intact.  Cardiovascular:     Rate and Rhythm: Normal rate.     Pulses: Normal pulses.  Pulmonary:     Effort: Pulmonary effort is normal. No respiratory distress.  Abdominal:     General: There is no  distension.     Palpations: Abdomen is soft.  Musculoskeletal:        General: No tenderness or signs of injury.     Cervical back: Neck supple.     Right lower leg: No edema.     Left lower leg: No edema.     Comments: Patient has good distal strength without clonus.  Skin:    Findings: No erythema or rash.  Neurological:     General: No focal deficit present.     Mental Status: He is alert and oriented to person, place, and time.     Sensory: No sensory deficit.     Motor: No weakness or abnormal muscle tone.     Coordination: Coordination normal.  Psychiatric:        Mood and Affect: Mood normal.        Behavior: Behavior normal.      Imaging: XR C-ARM NO REPORT  Result Date: 12/25/2022 Please see Notes tab for imaging impression.

## 2022-12-29 ENCOUNTER — Ambulatory Visit (INDEPENDENT_AMBULATORY_CARE_PROVIDER_SITE_OTHER): Payer: PPO | Admitting: Physical Medicine and Rehabilitation

## 2022-12-29 DIAGNOSIS — M961 Postlaminectomy syndrome, not elsewhere classified: Secondary | ICD-10-CM

## 2022-12-29 DIAGNOSIS — M5416 Radiculopathy, lumbar region: Secondary | ICD-10-CM

## 2022-12-29 DIAGNOSIS — G894 Chronic pain syndrome: Secondary | ICD-10-CM

## 2022-12-29 NOTE — Progress Notes (Unsigned)
hhhhhhhhhhhhhhhhhhhhhhhhhhhhhhhhhhhhhhhhhhhhhhhhhhhhhhhhhhhhhhhhhhhhhhhhhhhhhhhhhhhhhhhhhhhhhhhhhhhhhhhhhhhhhhhhhhhhhhhhhhhhhhhhhhhhhhhhhhhhhhhhhhhhhhhhhhhhhhhhhhhhhhhhhhhhhhhhhhhhhhhhhhhhhh

## 2022-12-30 ENCOUNTER — Emergency Department: Payer: PPO

## 2022-12-30 ENCOUNTER — Emergency Department
Admission: EM | Admit: 2022-12-30 | Discharge: 2022-12-30 | Disposition: A | Discharge status: Home / Self Care | Payer: PPO | Attending: Emergency Medicine | Admitting: Emergency Medicine

## 2022-12-30 ENCOUNTER — Encounter: Payer: Self-pay | Admitting: Physical Medicine and Rehabilitation

## 2022-12-30 DIAGNOSIS — I129 Hypertensive chronic kidney disease with stage 1 through stage 4 chronic kidney disease, or unspecified chronic kidney disease: Secondary | ICD-10-CM | POA: Insufficient documentation

## 2022-12-30 DIAGNOSIS — Y992 Volunteer activity: Secondary | ICD-10-CM | POA: Insufficient documentation

## 2022-12-30 DIAGNOSIS — D649 Anemia, unspecified: Secondary | ICD-10-CM | POA: Insufficient documentation

## 2022-12-30 DIAGNOSIS — Y92219 Unspecified school as the place of occurrence of the external cause: Secondary | ICD-10-CM | POA: Diagnosis not present

## 2022-12-30 DIAGNOSIS — I10 Essential (primary) hypertension: Secondary | ICD-10-CM | POA: Diagnosis not present

## 2022-12-30 DIAGNOSIS — G319 Degenerative disease of nervous system, unspecified: Secondary | ICD-10-CM | POA: Diagnosis not present

## 2022-12-30 DIAGNOSIS — Y9301 Activity, walking, marching and hiking: Secondary | ICD-10-CM | POA: Diagnosis not present

## 2022-12-30 DIAGNOSIS — W01198A Fall on same level from slipping, tripping and stumbling with subsequent striking against other object, initial encounter: Secondary | ICD-10-CM | POA: Diagnosis not present

## 2022-12-30 DIAGNOSIS — S0083XA Contusion of other part of head, initial encounter: Secondary | ICD-10-CM | POA: Diagnosis not present

## 2022-12-30 DIAGNOSIS — W19XXXA Unspecified fall, initial encounter: Secondary | ICD-10-CM | POA: Diagnosis not present

## 2022-12-30 DIAGNOSIS — I959 Hypotension, unspecified: Secondary | ICD-10-CM | POA: Diagnosis not present

## 2022-12-30 DIAGNOSIS — R609 Edema, unspecified: Secondary | ICD-10-CM | POA: Diagnosis not present

## 2022-12-30 DIAGNOSIS — I4891 Unspecified atrial fibrillation: Secondary | ICD-10-CM | POA: Diagnosis not present

## 2022-12-30 DIAGNOSIS — R944 Abnormal results of kidney function studies: Secondary | ICD-10-CM | POA: Insufficient documentation

## 2022-12-30 DIAGNOSIS — Z7901 Long term (current) use of anticoagulants: Secondary | ICD-10-CM | POA: Insufficient documentation

## 2022-12-30 DIAGNOSIS — S0990XA Unspecified injury of head, initial encounter: Secondary | ICD-10-CM | POA: Diagnosis present

## 2022-12-30 DIAGNOSIS — R220 Localized swelling, mass and lump, head: Secondary | ICD-10-CM | POA: Diagnosis not present

## 2022-12-30 DIAGNOSIS — N189 Chronic kidney disease, unspecified: Secondary | ICD-10-CM | POA: Insufficient documentation

## 2022-12-30 LAB — CBC
HCT: 33.7 % — ABNORMAL LOW (ref 39.0–52.0)
Hemoglobin: 11.2 g/dL — ABNORMAL LOW (ref 13.0–17.0)
MCH: 33 pg (ref 26.0–34.0)
MCHC: 33.2 g/dL (ref 30.0–36.0)
MCV: 99.4 fL (ref 80.0–100.0)
Platelets: 219 10*3/uL (ref 150–400)
RBC: 3.39 MIL/uL — ABNORMAL LOW (ref 4.22–5.81)
RDW: 13.4 % (ref 11.5–15.5)
WBC: 3.5 10*3/uL — ABNORMAL LOW (ref 4.0–10.5)
nRBC: 0 % (ref 0.0–0.2)

## 2022-12-30 LAB — BASIC METABOLIC PANEL
Anion gap: 8 (ref 5–15)
BUN: 40 mg/dL — ABNORMAL HIGH (ref 8–23)
CO2: 26 mmol/L (ref 22–32)
Calcium: 9 mg/dL (ref 8.9–10.3)
Chloride: 102 mmol/L (ref 98–111)
Creatinine, Ser: 2.08 mg/dL — ABNORMAL HIGH (ref 0.61–1.24)
GFR, Estimated: 32 mL/min — ABNORMAL LOW (ref >60)
Glucose, Bld: 112 mg/dL — ABNORMAL HIGH (ref 70–99)
Potassium: 4.5 mmol/L (ref 3.5–5.1)
Sodium: 136 mmol/L (ref 135–145)

## 2022-12-30 NOTE — ED Notes (Signed)
Assumed care of pt at this time. Pt is AAXO4, on CCM, VS stable and WNL. Pt and wife voicing concerns of long wait times. This RN apologize to both over increased wait times today. Offered pt and wife a drink and wamr blanket; provided ginger ale as requested.

## 2022-12-30 NOTE — ED Notes (Signed)
Provided pt with discharge instructions and education. All of pt questions answered. Pt in possession of all belongings. Pt AAOX4 and stable at time of discharge.Pt wheeled to into wife vehicle safely.

## 2022-12-30 NOTE — Progress Notes (Signed)
Gavin Maxwell - 77 y.o. male MRN [id number]  Date of birth: 10-Apr-1945  Office Visit Note: Visit Date: 12/29/2022 PCP: Marguarite Arbour, MD Referred by: Romero Belling, MD  Subjective: Chief Complaint  Patient presents with   Lower Back - Follow-up   HPI:  Gavin Maxwell is a 77 y.o. male who comes in today for spinal cord stimulator lead pull and management of chronic worsening severe pain which has been recalcitrant to previous treatment.  He reports greater than 60% pain relief with the stimulator trial and is very pleased with the amount of relief and functional increase he has obtained.  He is able to walk and do more with the stimulator in place.  He does want to move forward with implant.  He will restart his Eliquis.  I will send a referral to Dr. Estill Bamberg for spinal cord stimulator implant.  He did his prior surgery.   ROS Otherwise per HPI.  Assessment & Plan: Visit Diagnoses:    ICD-10-CM   1. Lumbar radiculopathy  M54.16 Ambulatory referral to Orthopedic Surgery    2. Post laminectomy syndrome  M96.1 Ambulatory referral to Orthopedic Surgery    3. Chronic pain syndrome  G89.4 Ambulatory referral to Orthopedic Surgery      Plan: No additional findings.   Meds & Orders: No orders of the defined types were placed in this encounter.   Orders Placed This Encounter  Procedures   Ambulatory referral to Orthopedic Surgery    Follow-up: Return if symptoms worsen or fail to improve.   Procedures: Quick procedure note: Patient was placed prone on the exam table. The external stimulator/generator was unfastened from the leads. The outer Tegaderm was removed from the bandage carefully. There was no noted erythema of the skin or swelling or induration or drainage. There had been some bloody discharge in the gauze pad. Both trial leads were pulled without difficulty and without discomfort. There was no drainage from the insertion site. Band-Aid was applied.       Clinical History: MRI LUMBAR SPINE WITHOUT CONTRAST  TECHNIQUE: Multiplanar, multisequence MR imaging of the lumbar spine was performed. No intravenous contrast was administered.  COMPARISON: 10/14/2021  FINDINGS: Segmentation: 5 lumbar type vertebrae  Alignment: Levoscoliosis. Fused L4-5 and L3-4 anterolisthesis. Mild L2-3 retrolisthesis  Vertebrae: No fracture, evidence of discitis, or bone lesion.  Conus medullaris and cauda equina: Conus extends to the L1-2 level. Conus and cauda equina appear normal.  Paraspinal and other soft tissues: Expected scarring and atrophy of intrinsic back muscles.  Disc levels:  T12- L1: Unremarkable.  L1-L2: Disc collapse and endplate degeneration worse towards the left where there is endplate and facet spurring causing severe foraminal impingement. Moderate to advanced right foraminal impingement is well. Moderate spinal stenosis.  L2-L3: Disc narrowing and bulging with retrolisthesis. Biforaminal protrusion and asymmetric right facet spurring. Advanced right foraminal impingement. Moderate spinal stenosis that is stable or mildly progressed. Moderate left foraminal narrowing  L3-L4: PLIF. Patent appearance of the canal and foramina.  L4-L5: PLIF. Patent appearance of the canal and foramina based on axial images.  L5-S1:Disc bulging and facet spurring. Patent appearance of the canal and foramina.  IMPRESSION: 1. No significant change compared to July 2023. 2. Biforaminal impingement at L1-2 and L2-3, particularly severe on the left at L1-2 and on the right at L2-3. Moderate spinal stenosis at both levels. 3. L3-L5 fusion with patent appearance of the canal and foramina.   Electronically Signed By: Audry Riles.D.  On: 09/17/2022 20:08     Objective:  VS:  HT:    WT:   BMI:     BP:   HR: bpm  TEMP: ( )  RESP:  Physical Exam Musculoskeletal:     Comments: Wearing brace on left arm.  Ambulates with a cane  slowly.  Good distal strength.  Skin:    General: Skin is warm and dry.     Findings: No erythema or rash.  Neurological:     Cranial Nerves: No cranial nerve deficit.     Gait: Gait abnormal.      Imaging: No results found.

## 2022-12-30 NOTE — Discharge Instructions (Signed)
You were seen in the ER today for evaluation after your fall.  You do have a hematoma over your forehead, but we fortunately did not find any fractures or bleeding in your brain.  Please continue to follow-up as an outpatient for further evaluation.  Return to the ER for new or worsening symptoms.

## 2022-12-30 NOTE — ED Triage Notes (Signed)
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 Pt reports he struck his head on the cement floor. Recently had another fall and has bruising to his face from previous encounter, but today has significant swelling above L eye. Denies LOC. Anticoagulated on Eliquis.

## 2022-12-30 NOTE — ED Provider Notes (Signed)
[name] Provider Note    Event Date/Time   First MD Initiated Contact with Patient 12/30/22 1614     (approximate)   History   Fall   HPI  Gavin Maxwell is a 77 year old male with history of hypertension, CKD, A-fib on Eliquis presenting to the emergency department for evaluation after a fall.  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  No loss of consciousness.  No preceding chest pain, shortness of breath, lightheadedness.  Was seen in our ER on 9/13 after similar episode at which time he was found to have a traumatic hematoma of his forehead, no acute intracranial findings.  Patient states that the bruising over his face is similar, but he now has a larger area of swelling over his forehead.  Given his head injury on anticoagulation use, he did present to the ER.      Physical Exam   Triage Vital Signs: ED Triage Vitals  Encounter Vitals Group     BP 12/30/22 1608 133/74     Systolic BP Percentile --      Diastolic BP Percentile --      Pulse Rate 12/30/22 1608 64     Resp 12/30/22 1608 14     Temp 12/30/22 1608 97.6 F (36.4 C)     Temp Source 12/30/22 1608 Oral     SpO2 12/30/22 1608 93 %     Weight 12/30/22 1609 220 lb (99.8 kg)     Height 12/30/22 1609 5\' 11"  (1.803 m)     Head Circumference --      Peak Flow --      Pain Score 12/30/22 1608 2     Pain Loc --      Pain Education --      Exclude from Growth Chart --     Most recent vital signs: Vitals:   12/30/22 1608 12/30/22 1900  BP: 133/74   Pulse: 64 (!) 53  Resp: 14   Temp: 97.6 F (36.4 C)   SpO2: 93% 100%    Nursing notes and vital signs reviewed.  General: Adult male, sitting upright in bed, awake, and reactive Head: Large area of healing ecchymosis with associated swelling over the left side of his face Neck: No midline tenderness Chest: Symmetric chest rise, no tenderness to palpation.  Cardiac:  Regular rhythm and rate.  Respiratory: Lungs clear to auscultation Abdomen: Soft, nondistended. No tenderness to palpation.  Pelvis: Stable in AP and lateral compression. No tenderness to palpation. MSK: No deformity to bilateral upper and lower extremity.  Chronic brace of left upper extremity.  Skin tear of the left hand.   Neuro: Alert, oriented. GCS 15. 5 out of 5 strength in bilateral upper and lower extremities. Normal sensation to light touch in bilateral upper and lower extremity. Skin: No evidence of burns   ED Results / Procedures / Treatments   Labs (all labs ordered are listed, but only abnormal results are displayed) Labs Reviewed  BASIC METABOLIC PANEL - Abnormal; Notable for the following components:      Result Value   Glucose, Bld 112 (*)    BUN 40 (*)    Creatinine, Ser 2.08 (*)    GFR, Estimated 32 (*)    All other components within normal limits  CBC - Abnormal; Notable for the following components:   WBC 3.5 (*)    RBC 3.39 (*)  Hemoglobin 11.2 (*)    HCT 33.7 (*)    All other components within normal limits  CBG MONITORING, ED     EKG EKG independently reviewed interpreted by myself (ER attending) demonstrates:    RADIOLOGY Imaging independently reviewed and interpreted by myself demonstrates:  CT head without acute bleed CT max face without acute fracture CT C-spine without acute fracture  PROCEDURES:  Critical Care performed: No  Procedures   MEDICATIONS ORDERED IN ED: Medications - No data to display   IMPRESSION / MDM / ASSESSMENT AND PLAN / ED COURSE  I reviewed the triage vital signs and the nursing notes.  Differential diagnosis includes, but is not limited to, intracranial bleed, skull fracture, spine fracture, no evidence of thoracoabdominal trauma  Patient's presentation is most consistent with acute presentation with potential threat to life or bodily function.  77 year old male presenting after reported mechanical fall  without preceding symptoms.  Labs with stable anemia, elevated creatinine, but similar to recent outpatient lab work in care everywhere.  Patient reports mild headache, denies other complaints.  He is eager to be discharged.  He does have a cane and feels that he is safe at home and is following up as an outpatient for further evaluation of his balance.  Do think he is stable for discharge home.  Strict return precautions provided.  Patient discharged stable condition.      FINAL CLINICAL IMPRESSION(S) / ED DIAGNOSES   Final diagnoses:  Closed head injury, initial encounter  Traumatic hematoma of forehead, initial encounter     Rx / DC Orders   ED Discharge Orders     None        Note:  This document was prepared using Dragon voice recognition software and may include unintentional dictation errors.   [name], MD 12/30/22 2000

## 2022-12-30 NOTE — ED Notes (Signed)
ED Provider at bedside. 

## 2022-12-30 NOTE — ED Triage Notes (Signed)
First Nurse Note;  Pt via [facility] from volunteering at the school. Pt mechanical fall. Pt did hit his head, No LOC but does take Eliquis. Pt has bruising for a previous fall, no new bruising but some swelling. Pt has a skin tear to the L hand, bleeding controlled. Pt is A&Ox4 and NAD VSS

## 2023-01-08 DIAGNOSIS — I10 Essential (primary) hypertension: Secondary | ICD-10-CM | POA: Diagnosis not present

## 2023-01-08 DIAGNOSIS — E782 Mixed hyperlipidemia: Secondary | ICD-10-CM | POA: Diagnosis not present

## 2023-01-08 DIAGNOSIS — Z125 Encounter for screening for malignant neoplasm of prostate: Secondary | ICD-10-CM | POA: Diagnosis not present

## 2023-01-08 DIAGNOSIS — Z79899 Other long term (current) drug therapy: Secondary | ICD-10-CM | POA: Diagnosis not present

## 2023-01-15 DIAGNOSIS — E782 Mixed hyperlipidemia: Secondary | ICD-10-CM | POA: Diagnosis not present

## 2023-01-15 DIAGNOSIS — F411 Generalized anxiety disorder: Secondary | ICD-10-CM | POA: Diagnosis not present

## 2023-01-15 DIAGNOSIS — Z79899 Other long term (current) drug therapy: Secondary | ICD-10-CM | POA: Diagnosis not present

## 2023-01-15 DIAGNOSIS — I10 Essential (primary) hypertension: Secondary | ICD-10-CM | POA: Diagnosis not present

## 2023-01-15 DIAGNOSIS — I480 Paroxysmal atrial fibrillation: Secondary | ICD-10-CM | POA: Diagnosis not present

## 2023-01-15 DIAGNOSIS — N183 Chronic kidney disease, stage 3 unspecified: Secondary | ICD-10-CM | POA: Diagnosis not present

## 2023-01-27 DIAGNOSIS — M5416 Radiculopathy, lumbar region: Secondary | ICD-10-CM | POA: Diagnosis not present

## 2023-02-05 ENCOUNTER — Other Ambulatory Visit: Payer: Self-pay | Admitting: Orthopedic Surgery

## 2023-02-06 ENCOUNTER — Telehealth: Payer: Self-pay

## 2023-02-06 NOTE — Telephone Encounter (Signed)
Please advise holding Eliquis prior to thoracic laminectomy and spinal cord stimulator placement with battery on 02/19/2023.  Thank you!  DW

## 2023-02-06 NOTE — Telephone Encounter (Signed)
   Pre-operative Risk Assessment    Patient Name: Gavin Maxwell  DOB: Mar 07, 1946 MRN: [id number]  Last office visit : 08/12/2022 Upcoming appointment : 02/16/2023      Request for Surgical Clearance    Procedure:  Thoracic laminectomy and placement of spinal cord stimulator and battery  Date of Surgery:  Clearance 02/19/23                                 Surgeon:  Estill Bamberg, MD Surgeon's Group or Practice Name:[facility]  Phone number:  [phone] Fax number:  [phone]   Type of Clearance Requested:   - Medical  - Pharmacy:  Hold Apixaban (Eliquis) Not indicated   Type of Anesthesia:  Not Indicated   Additional requests/questions:    Elyse Jarvis   02/06/2023, 12:41 PM

## 2023-02-07 NOTE — Telephone Encounter (Signed)
Patient with diagnosis of afib on Eliquis for anticoagulation.    Procedure: Thoracic laminectomy and placement of spinal cord stimulator and battery  Date of procedure: 02/19/23   CHA2DS2-VASc Score = 5   This indicates a 7.2% annual risk of stroke. The patient's score is based upon: CHF History: 1 HTN History: 1 Diabetes History: 0 Stroke History: 0 Vascular Disease History: 1 Age Score: 2 Gender Score: 0      CrCl 33 ml/min Platelet count 203  Per office protocol, patient can hold Eliquis for 3 days prior to procedure.    **This guidance is not considered finalized until pre-operative APP has relayed final recommendations.**

## 2023-02-09 NOTE — Telephone Encounter (Signed)
I d/w pre op APP today Gavin Levering, NP ok to keep appt with Dr. Mariah Milling and add need pre op clearance to appt notes. Per pre op APP DW, NP inform pt he will need to take his Eliquis on 02/15/23 and then beginning 02/16/23 will hold until after surgery. Pt aware Dr. Yevette Edwards will advise pt when safe to resume Eliquis from a bleeding standpoint.   Pt thanked me for the help. I will update all parties involved.

## 2023-02-09 NOTE — Telephone Encounter (Signed)
   Name: Gavin Maxwell  DOB: 18-Oct-1945  MRN: [id number]  Primary Cardiologist: Julien Nordmann, MD   Preoperative team, please contact this patient and set up a phone call appointment for further preoperative risk assessment. Please obtain consent and complete medication review. Thank you for your help.  I confirm that guidance regarding antiplatelet and oral anticoagulation therapy has been completed and, if necessary, noted below.  Per Pharm D, patient may hold Eliquis for 3 days prior to procedure.    I also confirmed the patient resides in the state of [place]. As per [facility] Medical Board telemedicine laws, the patient must reside in the state in which the provider is licensed.   Carlos Levering, NP 02/09/2023, 11:50 AM [facility]

## 2023-02-10 DIAGNOSIS — M546 Pain in thoracic spine: Secondary | ICD-10-CM | POA: Diagnosis not present

## 2023-02-10 NOTE — Pre-Procedure Instructions (Signed)
Surgical Instructions   Your procedure is scheduled on Thursday, November 14th. Report to [facility] Main Entrance "A" at 7:30 A.M., then check in with the Admitting office. Any questions or running late day of surgery: call [phone]  Questions prior to your surgery date: call [phone], Monday-Friday, 8am-4pm. If you experience any cold or flu symptoms such as cough, fever, chills, shortness of breath, etc. between now and your scheduled surgery, please notify us at the above number.     Remember:  Do not eat after midnight the night before your surgery  You may drink clear liquids until 7:30 A.M. the morning of your surgery.   Clear liquids allowed are: Water, Non-Citrus Juices (without pulp), Carbonated Beverages, Clear Tea (no milk, honey, etc.), Black Coffee Only (NO MILK, CREAM OR POWDERED CREAMER of any kind), and Gatorade.  Patient Instructions  The night before surgery:  No food after midnight. ONLY clear liquids after midnight   The day of surgery (if you do NOT have diabetes):  Drink ONE (1) Pre-Surgery Clear Ensure by 7:30 A.M. the morning of surgery. Drink in one sitting. Do not sip.  This drink was given to you during your hospital  pre-op appointment visit.  Nothing else to drink after completing the  Pre-Surgery Clear Ensure.         If you have questions, please contact your surgeon's office.     Take these medicines the morning of surgery with A SIP OF WATER  amiodarone (PACERONE)  atorvastatin (LIPITOR)  Eye drops fenofibrate  fluticasone (FLONASE)  gabapentin (NEURONTIN)  metoprolol succinate (TOPROL-XL)-make sure you take your evening dose the night before surgery.  pantoprazole (PROTONIX)   May take these medicines IF NEEDED: acetaminophen (TYLENOL)  Albuterol inhaler-Please bring all inhalers with you the day of surgery.   Per your cardiologist, HOLD apixaban (ELIQUIS) for 3 days pre-op. Last dose should be Sunday, 02/15/23.  One week  prior to surgery, STOP taking any Aspirin (unless otherwise instructed by your surgeon) Aleve, Naproxen, Ibuprofen, Motrin, Advil, Goody's, BC's, all herbal medications, fish oil, and non-prescription vitamins.           WHAT DO I DO ABOUT MY DIABETES MEDICATION?   Hold FARXIGA for 72 hours pre-op. Last dose should be Sunday, 02/15/23.  HOW TO MANAGE YOUR DIABETES BEFORE AND AFTER SURGERY  Why is it important to control my blood sugar before and after surgery? Improving blood sugar levels before and after surgery helps healing and can limit problems. A way of improving blood sugar control is eating a healthy diet by:  Eating less sugar and carbohydrates  Increasing activity/exercise  Talking with your doctor about reaching your blood sugar goals High blood sugars (greater than 180 mg/dL) can raise your risk of infections and slow your recovery, so you will need to focus on controlling your diabetes during the weeks before surgery. Make sure that the doctor who takes care of your diabetes knows about your planned surgery including the date and location.  How do I manage my blood sugar before surgery? Check your blood sugar at least 4 times a day, starting 2 days before surgery, to make sure that the level is not too high or low.  Check your blood sugar the morning of your surgery when you wake up and every 2 hours until you get to the Short Stay unit.  If your blood sugar is less than 70 mg/dL, you will need to treat for low blood sugar: Do not take insulin.  Treat a low blood sugar (less than 70 mg/dL) with  cup of clear juice (cranberry or apple), 4 glucose tablets, OR glucose gel. Recheck blood sugar in 15 minutes after treatment (to make sure it is greater than 70 mg/dL). If your blood sugar is not greater than 70 mg/dL on recheck, call [phone] for further instructions. Report your blood sugar to the short stay nurse when you get to Short Stay.  If you are admitted to the  hospital after surgery: Your blood sugar will be checked by the staff and you will probably be given insulin after surgery (instead of oral diabetes medicines) to make sure you have good blood sugar levels. The goal for blood sugar control after surgery is 80-180 mg/dL.             Do NOT Smoke (Tobacco/Vaping) for 24 hours prior to your procedure.  If you use a CPAP at night, you may bring your mask/headgear for your overnight stay.   You will be asked to remove any contacts, glasses, piercing's, hearing aid's, dentures/partials prior to surgery. Please bring cases for these items if needed.    Patients discharged the day of surgery will not be allowed to drive home, and someone needs to stay with them for 24 hours.  SURGICAL WAITING ROOM VISITATION Patients may have no more than 2 support people in the waiting area - these visitors may rotate.   Pre-op nurse will coordinate an appropriate time for 1 ADULT support person, who may not rotate, to accompany patient in pre-op.  Children under the age of 62 must have an adult with them who is not the patient and must remain in the main waiting area with an adult.  If the patient needs to stay at the hospital during part of their recovery, the visitor guidelines for inpatient rooms apply.  Please refer to the [facility] website for the visitor guidelines for any additional information.   If you received a COVID test during your pre-op visit  it is requested that you wear a mask when out in public, stay away from anyone that may not be feeling well and notify your surgeon if you develop symptoms. If you have been in contact with anyone that has tested positive in the last 10 days please notify you surgeon.      Pre-operative 5 CHG Bathing Instructions   You can play a key role in reducing the risk of infection after surgery. Your skin needs to be as free of germs as possible. You can reduce the number of germs on your skin by washing with  CHG (chlorhexidine gluconate) soap before surgery. CHG is an antiseptic soap that kills germs and continues to kill germs even after washing.   DO NOT use if you have an allergy to chlorhexidine/CHG or antibacterial soaps. If your skin becomes reddened or irritated, stop using the CHG and notify one of our RNs at [phone].   Please shower with the CHG soap starting 4 days before surgery using the following schedule:     Please keep in mind the following:  DO NOT shave, including legs and underarms, starting the day of your first shower.   You may shave your face at any point before/day of surgery.  Place clean sheets on your bed the day you start using CHG soap. Use a clean washcloth (not used since being washed) for each shower. DO NOT sleep with pets once you start using the CHG.   CHG Shower Instructions:  Wash your face and private area with normal soap. If you choose to wash your hair, wash first with your normal shampoo.  After you use shampoo/soap, rinse your hair and body thoroughly to remove shampoo/soap residue.  Turn the water OFF and apply about 3 tablespoons (45 ml) of CHG soap to a CLEAN washcloth.  Apply CHG soap ONLY FROM YOUR NECK DOWN TO YOUR TOES (washing for 3-5 minutes)  DO NOT use CHG soap on face, private areas, open wounds, or sores.  Pay special attention to the area where your surgery is being performed.  If you are having back surgery, having someone wash your back for you may be helpful. Wait 2 minutes after CHG soap is applied, then you may rinse off the CHG soap.  Pat dry with a clean towel  Put on clean clothes/pajamas   If you choose to wear lotion, please use ONLY the CHG-compatible lotions on the back of this paper.   Additional instructions for the day of surgery: DO NOT APPLY any lotions, deodorants, cologne, or perfumes.   Do not bring valuables to the hospital. [facility] is not responsible for any belongings/valuables. Do not wear nail  polish, gel polish, artificial nails, or any other type of covering on natural nails (fingers and toes) Do not wear jewelry or makeup Put on clean/comfortable clothes.  Please brush your teeth.  Ask your nurse before applying any prescription medications to the skin.     CHG Compatible Lotions   Aveeno Moisturizing lotion  Cetaphil Moisturizing Cream  Cetaphil Moisturizing Lotion  Clairol Herbal Essence Moisturizing Lotion, Dry Skin  Clairol Herbal Essence Moisturizing Lotion, Extra Dry Skin  Clairol Herbal Essence Moisturizing Lotion, Normal Skin  Curel Age Defying Therapeutic Moisturizing Lotion with Alpha Hydroxy  Curel Extreme Care Body Lotion  Curel Soothing Hands Moisturizing Hand Lotion  Curel Therapeutic Moisturizing Cream, Fragrance-Free  Curel Therapeutic Moisturizing Lotion, Fragrance-Free  Curel Therapeutic Moisturizing Lotion, Original Formula  Eucerin Daily Replenishing Lotion  Eucerin Dry Skin Therapy Plus Alpha Hydroxy Crme  Eucerin Dry Skin Therapy Plus Alpha Hydroxy Lotion  Eucerin Original Crme  Eucerin Original Lotion  Eucerin Plus Crme Eucerin Plus Lotion  Eucerin TriLipid Replenishing Lotion  Keri Anti-Bacterial Hand Lotion  Keri Deep Conditioning Original Lotion Dry Skin Formula Softly Scented  Keri Deep Conditioning Original Lotion, Fragrance Free Sensitive Skin Formula  Keri Lotion Fast Absorbing Fragrance Free Sensitive Skin Formula  Keri Lotion Fast Absorbing Softly Scented Dry Skin Formula  Keri Original Lotion  Keri Skin Renewal Lotion Keri Silky Smooth Lotion  Keri Silky Smooth Sensitive Skin Lotion  Nivea Body Creamy Conditioning Oil  Nivea Body Extra Enriched Lotion  Nivea Body Original Lotion  Nivea Body Sheer Moisturizing Lotion Nivea Crme  Nivea Skin Firming Lotion  NutraDerm 30 Skin Lotion  NutraDerm Skin Lotion  NutraDerm Therapeutic Skin Cream  NutraDerm Therapeutic Skin Lotion  ProShield Protective Hand Cream  Provon  moisturizing lotion  Please read over the following fact sheets that you were given.

## 2023-02-11 ENCOUNTER — Other Ambulatory Visit: Payer: Self-pay

## 2023-02-11 ENCOUNTER — Encounter (HOSPITAL_COMMUNITY): Payer: Self-pay

## 2023-02-11 ENCOUNTER — Other Ambulatory Visit: Payer: Self-pay | Admitting: Cardiovascular Disease

## 2023-02-11 ENCOUNTER — Encounter (HOSPITAL_COMMUNITY)
Admission: RE | Admit: 2023-02-11 | Discharge: 2023-02-11 | Disposition: A | Discharge status: Home / Self Care | Payer: PPO | Source: Ambulatory Visit | Attending: Orthopedic Surgery | Admitting: Orthopedic Surgery

## 2023-02-11 VITALS — BP 122/66 | HR 65 | Temp 97.7°F | Resp 17 | Ht 71.0 in | Wt 220.0 lb

## 2023-02-11 DIAGNOSIS — Z01818 Encounter for other preprocedural examination: Secondary | ICD-10-CM | POA: Diagnosis present

## 2023-02-11 DIAGNOSIS — Z01812 Encounter for preprocedural laboratory examination: Secondary | ICD-10-CM | POA: Diagnosis not present

## 2023-02-11 HISTORY — DX: Dependence on other enabling machines and devices: Z99.89

## 2023-02-11 HISTORY — DX: Unspecified hearing loss, unspecified ear: H91.90

## 2023-02-11 LAB — BASIC METABOLIC PANEL
Anion gap: 9 (ref 5–15)
BUN: 35 mg/dL — ABNORMAL HIGH (ref 8–23)
CO2: 26 mmol/L (ref 22–32)
Calcium: 9 mg/dL (ref 8.9–10.3)
Chloride: 100 mmol/L (ref 98–111)
Creatinine, Ser: 2.15 mg/dL — ABNORMAL HIGH (ref 0.61–1.24)
GFR, Estimated: 31 mL/min — ABNORMAL LOW (ref >60)
Glucose, Bld: 98 mg/dL (ref 70–99)
Potassium: 5 mmol/L (ref 3.5–5.1)
Sodium: 135 mmol/L (ref 135–145)

## 2023-02-11 LAB — CBC
HCT: 34.4 % — ABNORMAL LOW (ref 39.0–52.0)
Hemoglobin: 11.3 g/dL — ABNORMAL LOW (ref 13.0–17.0)
MCH: 32.5 pg (ref 26.0–34.0)
MCHC: 32.8 g/dL (ref 30.0–36.0)
MCV: 98.9 fL (ref 80.0–100.0)
Platelets: 204 10*3/uL (ref 150–400)
RBC: 3.48 MIL/uL — ABNORMAL LOW (ref 4.22–5.81)
RDW: 13.5 % (ref 11.5–15.5)
WBC: 3.3 10*3/uL — ABNORMAL LOW (ref 4.0–10.5)
nRBC: 0 % (ref 0.0–0.2)

## 2023-02-11 LAB — SURGICAL PCR SCREEN
MRSA, PCR: POSITIVE — AB
Staphylococcus aureus: POSITIVE — AB

## 2023-02-11 NOTE — Progress Notes (Signed)
[employer] sent to MD to inform him of Positive Staph and Positive MRSA at his patient's PAT appointment today.  Will treat on DOS with Profend.  Asked MD to let me know if he needs anything additional.

## 2023-02-11 NOTE — Progress Notes (Signed)
PCP - Dr Aram Beecham Cardiologist - Dr Julien Nordmann (Clearance requested on 02/09/23) Pulmonology - Dr Raechel Chute  CT Chest x-ray - 09/19/22 EKG - 12/30/22 Stress Test - 07/23/22 ECHO - 06/03/22 Cardiac Cath - Greater than 20 yrs ago  ICD Pacemaker/Loop - n/a  Sleep Study -  Yes CPAP - uses CPAP nightly  Diabetes - n/a  Blood Thinner Instructions:  Last dose of ELiquis will be on 02/15/23 per your cardiologist.     Jerene Dilling for 72 hours prior to surgery. Last dose of Marcelline Deist will be on 02/15/23.  ERAS Protcol - PRE-SURGERY Ensure Drink given at PAT appt. with instructions.     Anesthesia review: Yes  STOP now taking any Aspirin (unless otherwise instructed by your surgeon), Aleve, Naproxen, Ibuprofen, Motrin, Advil, Goody's, BC's, all herbal medications, fish oil, and all vitamins.   Coronavirus Screening Do you have any of the following symptoms:  Cough yes/no: No Fever (>100.78F)  yes/no: No Runny nose yes/no: No Sore throat yes/no: No Difficulty breathing/shortness of breath  yes/no: No  Have you traveled in the last 14 days and where? yes/no: No  Patient verbalized understanding of instructions that were given to them at the PAT appointment. Patient was also instructed that they will need to review over the PAT instructions again at home before surgery.

## 2023-02-15 NOTE — Progress Notes (Unsigned)
Cardiology Office Note  Date:  02/16/2023   ID:  GARBIEL LEPPANEN, DOB 1945/08/06, MRN [id number]  PCP:  Marguarite Arbour, MD   Chief Complaint  Patient presents with   6 month follow up     Pre-op clearance for THORACIC LAMINECTOMY AND PLACEMENT OF SPINAL CORD STIMULATOR AND BATTERY, patient will have this Thursday, February 19, 2023. Patient stopped the [place] today prior to surgery. Medications reviewed by the patient verbally.     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 EF in afib 45 to 50% in 2/24 Presenting for follow-up of his SVT, atrial fibrillation  Seen in clinic by myself 5/24 Completed spinal cord stim trial, "it went well" Spinal cord stimulator planned this week 02/19/23 Of farxiga today in preparation for procedure this week  Left arm in a splint Heavily reliant on right arm  Leg weakness with neuropathy, gait instability  hopes this should improve after spinal cord stimulator  Denies any tachycardia or palpitations concerning for arrhythmia  Labs reviewed: CR 2.15, BUN 35 Total chol 69, LDL 27 Rare lasix , maybe 1 a week  EKG personally reviewed by myself on todays visit EKG Interpretation Date/Time:  Monday February 16 2023 09:59:24 EST Ventricular Rate:  67 PR Interval:  274 QRS Duration:  106 QT Interval:  430 QTC Calculation: 454 R Axis:   -61  Text Interpretation: Sinus rhythm with 1st degree A-V block Left anterior fascicular block When compared with ECG of 30-Dec-2022 16:12, No significant change was found Confirmed by Julien Nordmann [phone]) on 02/16/2023 10:07:42 AM   Other past medical history reviewed 05/28/2022 to 06/04/2022 after presenting to the ED with a 5-day history of shortness of  breath that had been getting progressively worse.  He had recently been evaluated by his PCP and diagnosed with COPD exacerbation, given doxycycline and prednisone without improvement.    new onset A-fib with RVR and was evaluated by cardiology on 06/03/22.  TTE on 05/29/22 revealed and EF of 60-65%, moderate concentric LVH.   TEE 06/05/22 which revealed an EF of 45 to 50%, global hypokinesis, mild MR, grade 3 atheroma plaque involving the aortic arch and descending aorta.   Troponin was elevated peaking at 2196--patient did not report any chest pain-- felt to be related to demand ischemia (not a candidate for MPI d/t body habitus; AKI on CKD prohibited LHC).   successfully cardioverted X1 at 150 J, remaining in sinus rhythm at discharge.  Plans were to continue amiodarone 400 mg twice daily for the next 7 days, then reduce to 400 mg daily.   Myoview ordered low risk study echo showed EF 45-50%,   hospital May 2019 acute shortness of breath primary spontaneous pneumothorax on the right Underwent chest tube insertion, right thoracoscopy and apical blebectomy and talc pleurodesis  Noted to have intermittent episodes of SVT treated with beta-blockers Followed by pulmonary  Prior CTs personally reviewed by myself  Rare episodes of near syncope, typically when sitting down One day sitting at computer, "almost went out" on 03/2017 Seen by [facility]:  Echo:  NORMAL LEFT VENTRICULAR SYSTOLIC FUNCTION   WITH MILD LVH NORMAL RIGHT VENTRICULAR SYSTOLIC FUNCTION MILD VALVULAR REGURGITATION (See above) NO VALVULAR STENOSIS MILD TR, PR TRIVIAL MR EF 50%  Carotid u/s 04/09/2017 which revealed minimal  atherosclerotic plaque bilaterally without hemodynamically significant stenosis.  Seen in [place], back doctor "abn heart rhythm, irregular, did not feel well":  Prior event monitor 24 hours done through [facility] showing rare short episodes atrial tachycardia.  Did not have a " spell" when he had the  monitor in place   PMH:   has a past medical history of Ambulates with cane, Anxiety, Cancer (HCC), CKD (chronic kidney disease) stage 3, GFR 30-59 ml/min (HCC), COPD (chronic obstructive pulmonary disease) (HCC), Depression, Dysrhythmia, GERD (gastroesophageal reflux disease), Hearing loss, History of hiatal hernia, Hypertension, Pneumonia, Primary localized osteoarthritis of knee (09/26/2014), Primary localized osteoarthrosis, shoulder region, rotator cuff arthropathy (10/04/2013), and Sleep apnea.  PSH:    Past Surgical History:  Procedure Laterality Date   CARDIAC CATHETERIZATION     20 yrs. ago   CARDIOVERSION N/A 06/03/2022   Procedure: CARDIOVERSION;  Surgeon: Antonieta Iba, MD;  Location: [facility] ORS;  Service: Cardiovascular;  Laterality: N/A;   CHOLECYSTECTOMY     EYE SURGERY Left    pt states he was "seeing double" and they did surgery to fix it   NASAL SINUS SURGERY     x2   REVERSE SHOULDER ARTHROPLASTY Right 10/04/2013   Procedure: REVERSE SHOULDER ARTHROPLASTY;  Surgeon: Eulas Post, MD;  Location: [facility] OR;  Service: Orthopedics;  Laterality: Right;   SHOULDER ACROMIOPLASTY Left    x 5 shoulder surgeries   SVT ABLATION N/A 03/07/2019   Procedure: SVT ABLATION;  Surgeon: Regan Lemming, MD;  Location: [facility];  Service: Cardiovascular;  Laterality: N/A;   TEE WITHOUT CARDIOVERSION N/A 06/03/2022   Procedure: TRANSESOPHAGEAL ECHOCARDIOGRAM;  Surgeon: Antonieta Iba, MD;  Location: [facility] ORS;  Service: Cardiovascular;  Laterality: N/A;   TOTAL HIP ARTHROPLASTY Right 07/26/2019   Procedure: TOTAL HIP ARTHROPLASTY;  Surgeon: Teryl Lucy, MD;  Location: [facility];  Service: Orthopedics;  Laterality: Right;   TOTAL HIP ARTHROPLASTY Left 1998   TOTAL KNEE ARTHROPLASTY Right 09/26/2014   Procedure: RIGHT TOTAL KNEE ARTHROPLASTY;  Surgeon: Teryl Lucy, MD;  Location: [facility];  Service: Orthopedics;  Laterality: Right;   TRANSFORAMINAL LUMBAR INTERBODY FUSION  (TLIF) WITH PEDICLE SCREW FIXATION 2 LEVEL Right 06/05/2021   Procedure: RIGHT-SIDED LUMBAR 3- LUMBAR 4, LUMBAR 4- LUMBAR 5 TRANSFORAMINAL LUMBAR INTERBODY FUSION AND DECOMPRESSION WITH INSTRUMENTATION AND ALLOGRAFT;  Surgeon: Estill Bamberg, MD;  Location: [facility];  Service: Orthopedics;  Laterality: Right;   VIDEO ASSISTED THORACOSCOPY (VATS) W/TALC PLEUADESIS Right 08/18/2017   Procedure: VIDEO ASSISTED THORACOSCOPY (VATS)POSSIBLE  W/TALC PLEUADESIS.POSSIBLE BLEBECTOMY;  Surgeon: Hulda Marin, MD;  Location: [facility] ORS;  Service: Thoracic;  Laterality: Right;    Current Outpatient Medications  Medication Sig Dispense Refill   acetaminophen (TYLENOL) 500 MG tablet Take 1,000 mg by mouth every 6 (six) hours as needed for moderate pain (pain score 4-6).     albuterol (PROVENTIL HFA;VENTOLIN HFA) 108 (90 Base) MCG/ACT inhaler Inhale 2 puffs into the lungs every 6 (six) hours as needed for wheezing or shortness of breath. 1 Inhaler 3   amiodarone (PACERONE) 200 MG tablet Take 1 tablet (200 mg total) by mouth daily. 90 tablet 3   apixaban (ELIQUIS) 5 MG TABS tablet Take 1 tablet by mouth twice daily 180 tablet 1   atorvastatin (LIPITOR) 80 MG tablet Take 1 tablet by mouth once daily 30 tablet 5   brimonidine-timolol (COMBIGAN) 0.2-0.5 % ophthalmic solution Place 1 drop into both eyes every 12 (twelve) hours.     Calcium Carb-Cholecalciferol (CALCIUM 600 +  D PO) Take 1 tablet by mouth daily.     FARXIGA 10 MG TABS tablet Take 1 tablet (10 mg total) by mouth daily. PLEASE CALL [phone] TO SCHEDULE 6 MONTH FOLLOW UP VISIT. THANK YOU. 90 tablet 0   fenofibrate 160 MG tablet Take 160 mg by mouth daily.      ferrous sulfate 325 (65 FE) MG tablet Take 325 mg by mouth daily.     fluticasone (FLONASE) 50 MCG/ACT nasal spray Place 2 sprays into both nostrils daily.      furosemide (LASIX) 20 MG tablet Take 20 mg tablet once a week on Saturday & an additional 20 mg tablet as needed.     gabapentin (NEURONTIN)  300 MG capsule Take 300 mg by mouth 3 (three) times daily.     imipramine (TOFRANIL) 25 MG tablet Take 25 mg by mouth at bedtime.     LUMIGAN 0.01 % SOLN Place 1 drop into both eyes at bedtime.     magnesium oxide (MAG-OX) 400 MG tablet Take 400 mg by mouth 3 (three) times daily.     metoprolol succinate (TOPROL-XL) 25 MG 24 hr tablet Take 1 tablet by mouth in the evening 90 tablet 0   Multiple Vitamin (MULTIVITAMIN WITH MINERALS) TABS tablet Take 1 tablet by mouth daily.     pantoprazole (PROTONIX) 40 MG tablet Take 40 mg by mouth daily.      polycarbophil (FIBERCON) 625 MG tablet Take 625 mg by mouth 2 (two) times daily.     sertraline (ZOLOFT) 50 MG tablet Take 50 mg by mouth at bedtime.     Budeson-Glycopyrrol-Formoterol (BREZTRI AEROSPHERE) 160-9-4.8 MCG/ACT AERO Inhale 2 puffs into the lungs in the morning and at bedtime. (Patient not taking: Reported on 02/10/2023) 5.9 g 0   cephALEXin (KEFLEX) 500 MG capsule Take 1 capsule (500 mg total) by mouth 2 (two) times daily. (Patient not taking: Reported on 02/10/2023) 14 capsule 0   No current facility-administered medications for this visit.    Allergies:   Amoxicillin-pot clavulanate, Celecoxib, and Oxycodone   Social History:  The patient  reports that he quit smoking about 34 years ago. His smoking use included cigarettes. He started smoking about 54 years ago. He has a 40 pack-year smoking history. He has never used smokeless tobacco. He reports that he does not drink alcohol and does not use drugs.   Family History:   family history includes Breast cancer in his mother; Cancer in his father; Hypertension in his mother; Lung cancer in his brother; Rheum arthritis in his father; Stroke in his paternal grandfather.   Review of Systems: Review of Systems  Constitutional: Negative.   HENT: Negative.    Respiratory: Negative.    Cardiovascular: Negative.   Gastrointestinal: Negative.   Musculoskeletal: Negative.   Neurological: Negative.    Psychiatric/Behavioral: Negative.    All other systems reviewed and are negative.   PHYSICAL EXAM: VS:  BP (!) 100/58 (BP Location: Right Arm, Patient Position: Sitting, Cuff Size: Normal)   Pulse 67   Ht 5\' 11"  (1.803 m)   Wt 226 lb 8 oz (102.7 kg)   SpO2 97%   BMI 31.59 kg/m  , BMI Body mass index is 31.59 kg/m. Constitutional:  oriented to person, place, and time. No distress.  HENT:  Head: Grossly normal Eyes:  no discharge. No scleral icterus.  Neck: No JVD, no carotid bruits  Cardiovascular: Regular rate and rhythm, no murmurs appreciated Pulmonary/Chest: Clear to auscultation bilaterally, no wheezes or  rails Abdominal: Soft.  no distension.  no tenderness.  Musculoskeletal: Normal range of motion Neurological:  normal muscle tone. Coordination normal. No atrophy Skin: Skin warm and dry Psychiatric: normal affect, pleasant  Recent Labs: 05/28/2022: B Natriuretic Peptide 215.1 05/29/2022: ALT 50 06/04/2022: Magnesium 1.9 07/16/2022: TSH 7.597 02/11/2023: BUN 35; Creatinine, Ser 2.15; Hemoglobin 11.3; Platelets 204; Potassium 5.0; Sodium 135    Lipid Panel Lab Results  Component Value Date   CHOL 69 05/29/2022   HDL 11 (L) 05/29/2022   LDLCALC 27 05/29/2022   TRIG 154 (H) 05/29/2022    Wt Readings from Last 3 Encounters:  02/16/23 226 lb 8 oz (102.7 kg)  02/11/23 220 lb (99.8 kg)  12/30/22 220 lb (99.8 kg)    ASSESSMENT AND PLAN:  Problem List Items Addressed This Visit       Cardiology Problems   Essential hypertension   Relevant Medications   furosemide (LASIX) 20 MG tablet   Hyperlipidemia   Relevant Medications   furosemide (LASIX) 20 MG tablet   SVT (supraventricular tachycardia) (HCC)   Relevant Medications   furosemide (LASIX) 20 MG tablet   Other Relevant Orders   EKG 12-Lead (Completed)     Other   CKD (chronic kidney disease) stage 3, GFR 30-59 ml/min (HCC)   Other Visit Diagnoses     Coronary artery disease involving native heart  without angina pectoris, unspecified vessel or lesion type    -  Primary   Relevant Medications   furosemide (LASIX) 20 MG tablet   Other Relevant Orders   EKG 12-Lead (Completed)   Paroxysmal atrial fibrillation (HCC)       Relevant Medications   furosemide (LASIX) 20 MG tablet   Other Relevant Orders   EKG 12-Lead (Completed)   Chest pain, unspecified type       Relevant Orders   EKG 12-Lead (Completed)   HFrEF (heart failure with reduced ejection fraction) (HCC)       Relevant Medications   furosemide (LASIX) 20 MG tablet   On amiodarone therapy       Relevant Orders   EKG 12-Lead (Completed)      Preop cardiovascular Acceptable risk for spinal cord stimulator February 19, 2023 No further cardiac testing needed Eliquis held, Marcelline Deist held in preparation for surgery  SVT Prior ablation, denies any tachypalpitations concerning for arrhythmia  Essential hypertension Blood pressure is well controlled on today's visit. No changes made to the medications.  Hyperlipidemia Cholesterol is at goal on the current lipid regimen. No changes to the medications were made.  Paroxysmal atrial fibrillation Maintaining normal sinus rhythm continue amiodarone, metoprolol, Eliquis  Chronic kidney disease Recommend he avoid NSAIDs Renal function somewhat labile but overall stable  Cardiomyopathy In the setting of atrial fibrillation, EF 45 to 50% Continue current medications Rare use of Lasix, appears euvolemic Myoview low risk   Signed, Dossie Arbour, M.D., Ph.D. [facility] [place], [place] [phone]

## 2023-02-16 ENCOUNTER — Encounter: Payer: Self-pay | Admitting: Cardiovascular Disease

## 2023-02-16 ENCOUNTER — Ambulatory Visit: Payer: PPO | Attending: Cardiovascular Disease | Admitting: Cardiovascular Disease

## 2023-02-16 VITALS — BP 100/58 | HR 67 | Ht 71.0 in | Wt 226.5 lb

## 2023-02-16 DIAGNOSIS — I2510 Atherosclerotic heart disease of native coronary artery without angina pectoris: Secondary | ICD-10-CM | POA: Diagnosis not present

## 2023-02-16 DIAGNOSIS — E782 Mixed hyperlipidemia: Secondary | ICD-10-CM | POA: Diagnosis not present

## 2023-02-16 DIAGNOSIS — I471 Supraventricular tachycardia, unspecified: Secondary | ICD-10-CM

## 2023-02-16 DIAGNOSIS — I10 Essential (primary) hypertension: Secondary | ICD-10-CM

## 2023-02-16 DIAGNOSIS — R079 Chest pain, unspecified: Secondary | ICD-10-CM

## 2023-02-16 DIAGNOSIS — I5020 Unspecified systolic (congestive) heart failure: Secondary | ICD-10-CM

## 2023-02-16 DIAGNOSIS — I480 Paroxysmal atrial fibrillation: Secondary | ICD-10-CM

## 2023-02-16 DIAGNOSIS — N1832 Chronic kidney disease, stage 3b: Secondary | ICD-10-CM | POA: Diagnosis not present

## 2023-02-16 DIAGNOSIS — Z79899 Other long term (current) drug therapy: Secondary | ICD-10-CM

## 2023-02-16 NOTE — Patient Instructions (Addendum)
Medication Instructions:  When you turn 77 years old, We will decrease the eliquis down to 2.5 mg twice a day  If you need a refill on your cardiac medications before your next appointment, please call your pharmacy.   Lab work: No new labs needed  Testing/Procedures: No new testing needed  Follow-Up: At [facility], you and your health needs are our priority.  As part of our continuing mission to provide you with exceptional heart care, we have created designated Provider Care Teams.  These Care Teams include your primary Cardiologist (physician) and Advanced Practice Providers (APPs -  Physician Assistants and Nurse Practitioners) who all work together to provide you with the care you need, when you need it.  You will need a follow up appointment in 12 months  Providers on your designated Care Team:   Nicolasa Ducking, NP Eula Listen, PA-C Cadence Fransico Michael, [place]  COVID-19 Vaccine Information can be found at: [email or web address] For questions related to vaccine distribution or appointments, please email [email or web address] or call [phone].

## 2023-02-16 NOTE — Anesthesia Preprocedure Evaluation (Signed)
Anesthesia Evaluation  Patient identified by MRN, date of birth, ID band Patient awake    Reviewed: Allergy & Precautions, NPO status , Patient's Chart, lab work & pertinent test results  History of Anesthesia Complications Negative for: history of anesthetic complications  Airway Mallampati: II  TM Distance: >3 FB Neck ROM: Full   Comment: Previous grade I view with Miller 2, required OPA and 2-handed BMV Dental  (+) Missing,  Denies loose teeth.:   Pulmonary neg shortness of breath, sleep apnea and Continuous Positive Airway Pressure Ventilation , COPD (does not use inhalers, no recent flares), neg recent URI, former smoker H/o pneumothorax   Pulmonary exam normal breath sounds clear to auscultation       Cardiovascular hypertension (metoprolol), Pt. on home beta blockers (-) angina + Past MI and +CHF (EF 45-50%)  (-) Cardiac Stents and (-) CABG + dysrhythmias (on amiodarone) Atrial Fibrillation and Supra Ventricular Tachycardia  Rhythm:Regular Rate:Normal  HLD  Low-risk stress test 07/23/2022   Neuro/Psych neg Seizures PSYCHIATRIC DISORDERS Anxiety Depression    Chronic pain syndrome  Neuromuscular disease (radiculopathy)    GI/Hepatic Neg liver ROS, hiatal hernia,GERD  ,,  Endo/Other  negative endocrine ROS    Renal/GU Renal disease     Musculoskeletal  (+) Arthritis , Osteoarthritis,    Abdominal   Peds  Hematology  (+) Blood dyscrasia, anemia H/o NHL, in remission  Lab Results      Component                Value               Date                      WBC                      3.3 (L)             02/11/2023                HGB                      11.3 (L)            02/11/2023                HCT                      34.4 (L)            02/11/2023                MCV                      98.9                02/11/2023                PLT                      204                 02/11/2023               Anesthesia Other Findings Last Eliquis: 02/15/2023 Last Farxiga: 02/15/2023  Reproductive/Obstetrics  Anesthesia Physical Anesthesia Plan  ASA: 3  Anesthesia Plan: General   Post-op Pain Management: Tylenol PO (pre-op)*   Induction: Intravenous  PONV Risk Score and Plan: 2 and Ondansetron, Dexamethasone and Treatment may vary due to age or medical condition  Airway Management Planned: Oral ETT  Additional Equipment:   Intra-op Plan:   Post-operative Plan: Extubation in OR  Informed Consent: I have reviewed the patients History and Physical, chart, labs and discussed the procedure including the risks, benefits and alternatives for the proposed anesthesia with the patient or authorized representative who has indicated his/her understanding and acceptance.     Dental advisory given  Plan Discussed with: CRNA and Anesthesiologist  Anesthesia Plan Comments: (Risks of general anesthesia discussed including, but not limited to, sore throat, hoarse voice, chipped/damaged teeth, injury to vocal cords, nausea and vomiting, allergic reactions, lung infection, heart attack, stroke, and death. All questions answered.   PAT note by Antionette Poles, PA-C: 77 year old male follows with cardiology for history of HTN, HLD, s/p ablation of AVNRT 03/2019, atrial fibrillation.  Patient had new onset of A-fib with RVR was evaluated by cardiology in February 2024.  TEE 06/05/2022 showed EF 45 to 50%, global hypokinesis, mild MR, grade 3 atheroma plaque involving the aortic arch and ascending aorta.  He was successfully cardioverted and continued on amiodarone.  Subsequent Myoview was low risk.  He was seen by Dr. Mariah Milling on 02/16/2023 for preop evaluation.  Per note, "Preop cardiovascular. Acceptable risk for spinal cord stimulator February 19, 2023. No further cardiac testing needed. Eliquis held, Marcelline Deist held in preparation for surgery."  Other  pertinent history includes former smoker with associated COPD, CKD 3, OSA on CPAP, GERD, hiatal hernia.  Patient reports last dose Eliquis 02/15/2023.  Preop labs reviewed, creatinine elevated 2.15 consistent with history of CKD, mild anemia with hemoglobin 11.3, otherwise unremarkable.  EKG 02/16/2023: Sinus rhythm with first-degree AV block.  Rate 67.  Left anterior fascicular block.  PFTs 09/19/2022: FVC-%Pred-Pre % 71 FEV1-%Pred-Pre % 69 FEV1FVC-%Pred-Pre % 96 TLC % pred % 84 RV % pred % 116 DLCO unc % pred % 53  Nuclear stress 07/22/2022:    Normal pharmacologic myocardial perfusion stress test without evidence of significant ischemia or scar.   Left ventricular systolic function is normal (LVEF > 55%).   Three-vessel coronary artery calcification noted on the attenuation correction CT.  There is also aortic atherosclerosis.   This is a low risk study.  TEE 06/03/2022: 1. Left ventricular ejection fraction, by estimation, is 45 to 50%. The  left ventricle has mildly decreased function. The left ventricle  demonstrates global hypokinesis. Left ventricular diastolic parameters are  indeterminate.  2. Right ventricular systolic function is normal. The right ventricular  size is normal.  3. Left atrial size was moderately dilated. No left atrial/left atrial  appendage thrombus was detected.  4. The mitral valve is normal in structure. Mild mitral valve  regurgitation. No evidence of mitral stenosis.  5. The aortic valve is normal in structure. Aortic valve regurgitation is  not visualized. Aortic valve sclerosis is present, with no evidence of  aortic valve stenosis.  6. There is Moderate (Grade III) atheroma plaque involving the aortic  arch and descending aorta.  7. The inferior vena cava is normal in size with greater than 50%  respiratory variability, suggesting right atrial pressure of 3 mmHg.  8. Agitated saline contrast bubble study was negative, with no  evidence  of any interatrial shunt.   Conclusion(s)/Recommendation(s): Normal  biventricular function without  evidence of hemodynamically significant valvular heart disease.   )        Anesthesia Quick Evaluation

## 2023-02-16 NOTE — Progress Notes (Signed)
Anesthesia Chart Review:  77 year old male follows with cardiology for history of HTN, HLD, s/p ablation of AVNRT 03/2019, atrial fibrillation.  Patient had new onset of A-fib with RVR was evaluated by cardiology in February 2024.  TEE 06/05/2022 showed EF 45 to 50%, global hypokinesis, mild MR, grade 3 atheroma plaque involving the aortic arch and ascending aorta.  He was successfully cardioverted and continued on amiodarone.  Subsequent Myoview was low risk.  He was seen by Dr. Mariah Milling on 02/16/2023 for preop evaluation.  Per note, "Preop cardiovascular. Acceptable risk for spinal cord stimulator February 19, 2023. No further cardiac testing needed. Eliquis held, Marcelline Deist held in preparation for surgery."  Other pertinent history includes former smoker with associated COPD, CKD 3, OSA on CPAP, GERD, hiatal hernia.  Patient reports last dose Eliquis 02/15/2023.  Preop labs reviewed, creatinine elevated 2.15 consistent with history of CKD, mild anemia with hemoglobin 11.3, otherwise unremarkable.  EKG 02/16/2023: Sinus rhythm with first-degree AV block.  Rate 67.  Left anterior fascicular block.  PFTs 09/19/2022: FVC-%Pred-Pre % 71  FEV1-%Pred-Pre % 69  FEV1FVC-%Pred-Pre % 96  TLC % pred % 84  RV % pred % 116  DLCO unc % pred % 53   Nuclear stress 07/22/2022:    Normal pharmacologic myocardial perfusion stress test without evidence of significant ischemia or scar.   Left ventricular systolic function is normal (LVEF > 55%).   Three-vessel coronary artery calcification noted on the attenuation correction CT.  There is also aortic atherosclerosis.   This is a low risk study.   TEE 06/03/2022:  1. Left ventricular ejection fraction, by estimation, is 45 to 50%. The  left ventricle has mildly decreased function. The left ventricle  demonstrates global hypokinesis. Left ventricular diastolic parameters are  indeterminate.   2. Right ventricular systolic function is normal. The right ventricular   size is normal.   3. Left atrial size was moderately dilated. No left atrial/left atrial  appendage thrombus was detected.   4. The mitral valve is normal in structure. Mild mitral valve  regurgitation. No evidence of mitral stenosis.   5. The aortic valve is normal in structure. Aortic valve regurgitation is  not visualized. Aortic valve sclerosis is present, with no evidence of  aortic valve stenosis.   6. There is Moderate (Grade III) atheroma plaque involving the aortic  arch and descending aorta.   7. The inferior vena cava is normal in size with greater than 50%  respiratory variability, suggesting right atrial pressure of 3 mmHg.   8. Agitated saline contrast bubble study was negative, with no evidence  of any interatrial shunt.   Conclusion(s)/Recommendation(s): Normal biventricular function without  evidence of hemodynamically significant valvular heart disease.    Zannie Cove [facility]/Anesthesiology Phone [phone] 02/16/2023 12:32 PM

## 2023-02-19 ENCOUNTER — Ambulatory Visit (HOSPITAL_COMMUNITY)
Admission: RE | Admit: 2023-02-19 | Discharge: 2023-02-19 | Disposition: A | Discharge status: Home / Self Care | Payer: PPO | Attending: Orthopedic Surgery | Admitting: Orthopedic Surgery

## 2023-02-19 ENCOUNTER — Encounter (HOSPITAL_COMMUNITY)
Admission: RE | Disposition: A | Discharge status: Home / Self Care | Payer: Self-pay | Source: Home / Self Care | Attending: Orthopedic Surgery

## 2023-02-19 ENCOUNTER — Other Ambulatory Visit: Payer: Self-pay

## 2023-02-19 ENCOUNTER — Ambulatory Visit (HOSPITAL_COMMUNITY): Payer: PPO

## 2023-02-19 ENCOUNTER — Ambulatory Visit (HOSPITAL_BASED_OUTPATIENT_CLINIC_OR_DEPARTMENT_OTHER): Payer: PPO | Admitting: Anesthesiology

## 2023-02-19 ENCOUNTER — Ambulatory Visit (HOSPITAL_COMMUNITY): Payer: PPO | Admitting: Physician Assistant

## 2023-02-19 DIAGNOSIS — I4891 Unspecified atrial fibrillation: Secondary | ICD-10-CM | POA: Diagnosis not present

## 2023-02-19 DIAGNOSIS — F32A Depression, unspecified: Secondary | ICD-10-CM | POA: Diagnosis not present

## 2023-02-19 DIAGNOSIS — E785 Hyperlipidemia, unspecified: Secondary | ICD-10-CM | POA: Insufficient documentation

## 2023-02-19 DIAGNOSIS — Z8572 Personal history of non-Hodgkin lymphomas: Secondary | ICD-10-CM | POA: Diagnosis not present

## 2023-02-19 DIAGNOSIS — Z4542 Encounter for adjustment and management of neuropacemaker (brain) (peripheral nerve) (spinal cord): Secondary | ICD-10-CM | POA: Diagnosis not present

## 2023-02-19 DIAGNOSIS — F419 Anxiety disorder, unspecified: Secondary | ICD-10-CM | POA: Diagnosis not present

## 2023-02-19 DIAGNOSIS — I472 Ventricular tachycardia, unspecified: Secondary | ICD-10-CM | POA: Diagnosis not present

## 2023-02-19 DIAGNOSIS — Z87891 Personal history of nicotine dependence: Secondary | ICD-10-CM | POA: Diagnosis not present

## 2023-02-19 DIAGNOSIS — K219 Gastro-esophageal reflux disease without esophagitis: Secondary | ICD-10-CM | POA: Diagnosis not present

## 2023-02-19 DIAGNOSIS — J449 Chronic obstructive pulmonary disease, unspecified: Secondary | ICD-10-CM | POA: Diagnosis not present

## 2023-02-19 DIAGNOSIS — G894 Chronic pain syndrome: Secondary | ICD-10-CM | POA: Insufficient documentation

## 2023-02-19 DIAGNOSIS — Z79899 Other long term (current) drug therapy: Secondary | ICD-10-CM | POA: Diagnosis not present

## 2023-02-19 DIAGNOSIS — G4730 Sleep apnea, unspecified: Secondary | ICD-10-CM | POA: Diagnosis not present

## 2023-02-19 DIAGNOSIS — Z7901 Long term (current) use of anticoagulants: Secondary | ICD-10-CM | POA: Diagnosis not present

## 2023-02-19 DIAGNOSIS — I5032 Chronic diastolic (congestive) heart failure: Secondary | ICD-10-CM | POA: Diagnosis not present

## 2023-02-19 DIAGNOSIS — I129 Hypertensive chronic kidney disease with stage 1 through stage 4 chronic kidney disease, or unspecified chronic kidney disease: Secondary | ICD-10-CM | POA: Insufficient documentation

## 2023-02-19 DIAGNOSIS — M79604 Pain in right leg: Secondary | ICD-10-CM | POA: Insufficient documentation

## 2023-02-19 DIAGNOSIS — I130 Hypertensive heart and chronic kidney disease with heart failure and stage 1 through stage 4 chronic kidney disease, or unspecified chronic kidney disease: Secondary | ICD-10-CM | POA: Diagnosis not present

## 2023-02-19 DIAGNOSIS — M5416 Radiculopathy, lumbar region: Secondary | ICD-10-CM

## 2023-02-19 DIAGNOSIS — N183 Chronic kidney disease, stage 3 unspecified: Secondary | ICD-10-CM | POA: Insufficient documentation

## 2023-02-19 DIAGNOSIS — M549 Dorsalgia, unspecified: Secondary | ICD-10-CM | POA: Diagnosis not present

## 2023-02-19 HISTORY — PX: THORACIC LAMINECTOMY FOR SPINAL CORD STIMULATOR: SHX6887

## 2023-02-19 SURGERY — THORACIC LAMINECTOMY FOR SPINAL CORD STIMULATOR
Anesthesia: General | Site: Back

## 2023-02-19 MED ORDER — LIDOCAINE 2% (20 MG/ML) 5 ML SYRINGE
INTRAMUSCULAR | Status: DC | PRN
  Administered 2023-02-19: 100 mg via INTRAVENOUS

## 2023-02-19 MED ORDER — VANCOMYCIN HCL IN DEXTROSE 1-5 GM/200ML-% IV SOLN
1000.0000 mg | Freq: Once | INTRAVENOUS | Status: AC
  Administered 2023-02-19: 1000 mg via INTRAVENOUS

## 2023-02-19 MED ORDER — FENTANYL CITRATE (PF) 100 MCG/2ML IJ SOLN
25.0000 ug | INTRAMUSCULAR | Status: DC | PRN
  Administered 2023-02-19 (×2): 25 ug via INTRAVENOUS

## 2023-02-19 MED ORDER — AMISULPRIDE (ANTIEMETIC) 5 MG/2ML IV SOLN: 10.0000 mg | Freq: Once | INTRAVENOUS | Status: DC | PRN

## 2023-02-19 MED ORDER — ONDANSETRON HCL 4 MG/2ML IJ SOLN
INTRAMUSCULAR | Status: AC
  Filled 2023-02-19: qty 2

## 2023-02-19 MED ORDER — THROMBIN 20000 UNITS EX SOLR
CUTANEOUS | Status: DC | PRN
  Administered 2023-02-19: 20 mL

## 2023-02-19 MED ORDER — BUPIVACAINE LIPOSOME 1.3 % IJ SUSP
INTRAMUSCULAR | Status: AC
  Filled 2023-02-19: qty 20

## 2023-02-19 MED ORDER — CEFAZOLIN SODIUM-DEXTROSE 2-4 GM/100ML-% IV SOLN
2.0000 g | INTRAVENOUS | Status: AC
  Administered 2023-02-19: 2 g via INTRAVENOUS
  Filled 2023-02-19: qty 100

## 2023-02-19 MED ORDER — ROCURONIUM BROMIDE 10 MG/ML (PF) SYRINGE
PREFILLED_SYRINGE | INTRAVENOUS | Status: AC
  Filled 2023-02-19: qty 10

## 2023-02-19 MED ORDER — THROMBIN 20000 UNITS EX SOLR: CUTANEOUS | Status: DC | PRN

## 2023-02-19 MED ORDER — ORAL CARE MOUTH RINSE: 15.0000 mL | Freq: Once | OROMUCOSAL | Status: AC

## 2023-02-19 MED ORDER — SUGAMMADEX SODIUM 200 MG/2ML IV SOLN
INTRAVENOUS | Status: DC | PRN
  Administered 2023-02-19: 200 mg via INTRAVENOUS

## 2023-02-19 MED ORDER — ACETAMINOPHEN 500 MG PO TABS
1000.0000 mg | ORAL_TABLET | Freq: Once | ORAL | Status: AC
  Administered 2023-02-19: 1000 mg via ORAL

## 2023-02-19 MED ORDER — PHENYLEPHRINE HCL-NACL 20-0.9 MG/250ML-% IV SOLN
INTRAVENOUS | Status: DC | PRN
  Administered 2023-02-19: 30 ug/min via INTRAVENOUS

## 2023-02-19 MED ORDER — BUPIVACAINE-EPINEPHRINE (PF) 0.25% -1:200000 IJ SOLN
INTRAMUSCULAR | Status: AC
  Filled 2023-02-19: qty 30

## 2023-02-19 MED ORDER — FENTANYL CITRATE (PF) 250 MCG/5ML IJ SOLN
INTRAMUSCULAR | Status: DC | PRN
  Administered 2023-02-19 (×5): 50 ug via INTRAVENOUS

## 2023-02-19 MED ORDER — ROCURONIUM BROMIDE 10 MG/ML (PF) SYRINGE
PREFILLED_SYRINGE | INTRAVENOUS | Status: DC | PRN
  Administered 2023-02-19: 60 mg via INTRAVENOUS

## 2023-02-19 MED ORDER — ONDANSETRON HCL 4 MG/2ML IJ SOLN
INTRAMUSCULAR | Status: DC | PRN
  Administered 2023-02-19: 4 mg via INTRAVENOUS

## 2023-02-19 MED ORDER — LIDOCAINE 2% (20 MG/ML) 5 ML SYRINGE
INTRAMUSCULAR | Status: AC
  Filled 2023-02-19: qty 5

## 2023-02-19 MED ORDER — PHENYLEPHRINE 80 MCG/ML (10ML) SYRINGE FOR IV PUSH (FOR BLOOD PRESSURE SUPPORT)
PREFILLED_SYRINGE | INTRAVENOUS | Status: AC
  Filled 2023-02-19: qty 10

## 2023-02-19 MED ORDER — DEXAMETHASONE SODIUM PHOSPHATE 10 MG/ML IJ SOLN
INTRAMUSCULAR | Status: AC
  Filled 2023-02-19: qty 1

## 2023-02-19 MED ORDER — THROMBIN 20000 UNITS EX SOLR
CUTANEOUS | Status: AC
  Filled 2023-02-19: qty 20000

## 2023-02-19 MED ORDER — FENTANYL CITRATE (PF) 100 MCG/2ML IJ SOLN
INTRAMUSCULAR | Status: AC
  Filled 2023-02-19: qty 2

## 2023-02-19 MED ORDER — BUPIVACAINE-EPINEPHRINE 0.25% -1:200000 IJ SOLN
INTRAMUSCULAR | Status: DC | PRN
  Administered 2023-02-19: 13.5 mL

## 2023-02-19 MED ORDER — CHLORHEXIDINE GLUCONATE 0.12 % MT SOLN
15.0000 mL | Freq: Once | OROMUCOSAL | Status: AC
  Administered 2023-02-19: 15 mL via OROMUCOSAL
  Filled 2023-02-19: qty 15

## 2023-02-19 MED ORDER — LACTATED RINGERS IV SOLN: INTRAVENOUS | Status: DC | PRN

## 2023-02-19 MED ORDER — FENTANYL CITRATE (PF) 250 MCG/5ML IJ SOLN
INTRAMUSCULAR | Status: AC
  Filled 2023-02-19: qty 5

## 2023-02-19 MED ORDER — MIDAZOLAM HCL 2 MG/2ML IJ SOLN
INTRAMUSCULAR | Status: AC
Start: 2023-02-19 — End: ?
  Filled 2023-02-19: qty 2

## 2023-02-19 MED ORDER — PROPOFOL 10 MG/ML IV BOLUS
INTRAVENOUS | Status: DC | PRN
  Administered 2023-02-19: 120 mg via INTRAVENOUS

## 2023-02-19 MED ORDER — PROPOFOL 10 MG/ML IV BOLUS
INTRAVENOUS | Status: AC
  Filled 2023-02-19: qty 20

## 2023-02-19 MED ORDER — BUPIVACAINE LIPOSOME 1.3 % IJ SUSP: INTRAMUSCULAR | Status: DC | PRN

## 2023-02-19 MED ORDER — PHENYLEPHRINE 80 MCG/ML (10ML) SYRINGE FOR IV PUSH (FOR BLOOD PRESSURE SUPPORT)
PREFILLED_SYRINGE | INTRAVENOUS | Status: DC | PRN
  Administered 2023-02-19: 160 ug via INTRAVENOUS
  Administered 2023-02-19: 40 ug via INTRAVENOUS
  Administered 2023-02-19 (×2): 80 ug via INTRAVENOUS
  Administered 2023-02-19: 160 ug via INTRAVENOUS

## 2023-02-19 MED ORDER — METHOCARBAMOL 750 MG PO TABS: 750.0000 mg | ORAL_TABLET | Freq: Four times a day (QID) | ORAL | 0 refills | Status: AC | PRN

## 2023-02-19 MED ORDER — POVIDONE-IODINE 7.5 % EX SOLN: Freq: Once | CUTANEOUS | Status: DC

## 2023-02-19 SURGICAL SUPPLY — 66 items
BAG COUNTER SPONGE SURGICOUNT (BAG) ×1 IMPLANT
BAG ISOLATION DRAPE 18X18 (DRAPES) ×1 IMPLANT
BENZOIN TINCTURE PRP APPL 2/3 (GAUZE/BANDAGES/DRESSINGS) ×1 IMPLANT
BUR MATCHSTICK NEURO 3.0 LAGG (BURR) IMPLANT
BUR SABER RD CUTTING 3.0 (BURR) IMPLANT
CANISTER SUCT 3000ML PPV (MISCELLANEOUS) ×1 IMPLANT
CONTROL REMOTE FREELINK ALPHA (NEUROSURGERY SUPPLIES) IMPLANT
CORD BIPOLAR FORCEPS 12FT (ELECTRODE) ×1 IMPLANT
COVER MAYO STAND STRL (DRAPES) IMPLANT
COVER SURGICAL LIGHT HANDLE (MISCELLANEOUS) IMPLANT
DRAPE C-ARM 42X72 X-RAY (DRAPES) ×1 IMPLANT
DRAPE POUCH INSTRU U-SHP 10X18 (DRAPES) ×1 IMPLANT
DRAPE SURG 17X23 STRL (DRAPES) ×3 IMPLANT
DURAPREP 26ML APPLICATOR (WOUND CARE) ×1 IMPLANT
ELECT CAUTERY BLADE 6.4 (BLADE) ×1 IMPLANT
ELECT REM PT RETURN 9FT ADLT (ELECTROSURGICAL) ×1
ELECTRODE REM PT RTRN 9FT ADLT (ELECTROSURGICAL) ×1 IMPLANT
ELEVATER PASSER (SPINAL CORD STIMULATOR) ×1
GAUZE 4X4 16PLY ~~LOC~~+RFID DBL (SPONGE) ×1 IMPLANT
GAUZE SPONGE 4X4 12PLY STRL (GAUZE/BANDAGES/DRESSINGS) ×1 IMPLANT
GLOVE BIO SURGEON STRL SZ 6.5 (GLOVE) ×1 IMPLANT
GLOVE BIO SURGEON STRL SZ8 (GLOVE) ×1 IMPLANT
GLOVE BIOGEL PI IND STRL 8 (GLOVE) ×1 IMPLANT
GLOVE SURG ENC MOIS LTX SZ6.5 (GLOVE) ×1 IMPLANT
GOWN STRL REUS W/ TWL LRG LVL3 (GOWN DISPOSABLE) ×2 IMPLANT
GOWN STRL REUS W/ TWL XL LVL3 (GOWN DISPOSABLE) ×1 IMPLANT
GOWN STRL REUS W/TWL LRG LVL3 (GOWN DISPOSABLE) ×2
GOWN STRL REUS W/TWL XL LVL3 (GOWN DISPOSABLE) ×1
KIT BASIN OR (CUSTOM PROCEDURE TRAY) ×1 IMPLANT
KIT CHARGING PRECISION NEURO (KITS) IMPLANT
KIT IPG ALPHA WAVEWRITER (Stimulator) IMPLANT
KIT TURNOVER KIT B (KITS) ×1 IMPLANT
LEAD COVER EDGE 50CM STIM KIT (Lead) IMPLANT
NDL HYPO 25GX1X1/2 BEV (NEEDLE) ×1 IMPLANT
NDL SUT 6 .5 CRC .975X.05 MAYO (NEEDLE) ×1 IMPLANT
NEEDLE HYPO 25GX1X1/2 BEV (NEEDLE) ×1
NEEDLE MAYO TAPER (NEEDLE) ×1
NS IRRIG 1000ML POUR BTL (IV SOLUTION) ×1 IMPLANT
PACK LAMINECTOMY ORTHO (CUSTOM PROCEDURE TRAY) ×1 IMPLANT
PACK UNIVERSAL I (CUSTOM PROCEDURE TRAY) ×1 IMPLANT
PAD ARMBOARD 7.5X6 YLW CONV (MISCELLANEOUS) ×2 IMPLANT
PADDLE BLANK SIMULATOR LEAD 32 (MISCELLANEOUS) IMPLANT
PASSER ELEVATOR (SPINAL CORD STIMULATOR) IMPLANT
SPONGE INTESTINAL PEANUT (DISPOSABLE) IMPLANT
SPONGE SURGIFOAM ABS GEL 100 (HEMOSTASIS) IMPLANT
SPONGE T-LAP 4X18 ~~LOC~~+RFID (SPONGE) ×1 IMPLANT
STRIP CLOSURE SKIN 1/2X4 (GAUZE/BANDAGES/DRESSINGS) ×1 IMPLANT
SUT FIBERWIRE 2-0 18 17.9 3/8 (SUTURE)
SUT MNCRL AB 3-0 PS2 18 (SUTURE) ×2 IMPLANT
SUT MNCRL AB 4-0 PS2 18 (SUTURE) IMPLANT
SUT SILK 3 0 RB1 (SUTURE) IMPLANT
SUT VIC AB 0 CT1 18XCR BRD 8 (SUTURE) ×1 IMPLANT
SUT VIC AB 0 CT1 8-18 (SUTURE) ×1
SUT VIC AB 1 CT1 18XCR BRD 8 (SUTURE) ×1 IMPLANT
SUT VIC AB 1 CT1 8-18 (SUTURE) ×1
SUT VIC AB 2-0 CT2 18 VCP726D (SUTURE) ×1 IMPLANT
SUTURE FIBERWR 2-0 18 17.9 3/8 (SUTURE) ×1 IMPLANT
SYR BULB IRRIG 60ML STRL (SYRINGE) ×1 IMPLANT
SYR CONTROL 10ML LL (SYRINGE) ×1 IMPLANT
TAPE CLOTH SURG 4X10 WHT LF (GAUZE/BANDAGES/DRESSINGS) IMPLANT
TOOL LONG TUNNEL (SPINAL CORD STIMULATOR) IMPLANT
TOWEL GREEN STERILE (TOWEL DISPOSABLE) ×1 IMPLANT
TOWEL GREEN STERILE FF (TOWEL DISPOSABLE) ×1 IMPLANT
TRAY FOLEY MTR SLVR 16FR STAT (SET/KITS/TRAYS/PACK) IMPLANT
WATER STERILE IRR 1000ML POUR (IV SOLUTION) ×1 IMPLANT
YANKAUER SUCT BULB TIP NO VENT (SUCTIONS) IMPLANT

## 2023-02-19 NOTE — Anesthesia Postprocedure Evaluation (Signed)
Anesthesia Post Note  Patient: Gavin Maxwell  Procedure(s) Performed: THORACIC LAMINECTOMY AND PLACEMENT OF SPINAL CORD STIMULATOR AND BATTERY (Back)     Patient location during evaluation: PACU Anesthesia Type: General Level of consciousness: awake Pain management: pain level controlled Vital Signs Assessment: post-procedure vital signs reviewed and stable Respiratory status: spontaneous breathing, nonlabored ventilation and respiratory function stable Cardiovascular status: blood pressure returned to baseline and stable Postop Assessment: no apparent nausea or vomiting Anesthetic complications: no   No notable events documented.  Last Vitals:  Vitals:   02/19/23 1500 02/19/23 1515  BP: 117/64 115/68  Pulse: (!) 52 (!) 52  Resp: 15 (!) 7  Temp: (!) 36.3 C   SpO2: 98% 94%    Last Pain:  Vitals:   02/19/23 1445  PainSc: Asleep                 Linton Rump

## 2023-02-19 NOTE — Op Note (Signed)
PATIENT NAME: Gavin Maxwell    MEDICAL RECORD NO.:  [id number]    DATE OF BIRTH: 01-09-1946    DATE OF PROCEDURE: 02/19/2023                                  OPERATIVE REPORT     PREOPERATIVE DIAGNOSES: Chronic pain syndrome, including low back pain and right leg pain   POSTOPERATIVE DIAGNOSES: Chronic pain syndrome, including low back pain and right leg pain   PROCEDURE:    Thoracic laminectomy for placement of spinal cord stimulator leads Placement of spinal cord stimulator generator Intraoperative use of fluoroscopy   SURGEON:  Estill Bamberg, MD.   ASSISTANTJason Coop, PA-C.   ANESTHESIA:  General endotracheal anesthesia.   COMPLICATIONS:  None.   DISPOSITION:  Stable.   ESTIMATED BLOOD LOSS:  Minimal.   INDICATIONS FOR SURGERY:  Briefly, [name] is a very pleasant 77- year-old male, who did present to me with ongoing chronic back pain, in addition to pain in his right leg. He was felt to not be a candidate for more invasive surgeries to address this.  Given this, a spinal cord stimulator trial performed by Dr. Alvester Morin.  Patient did report that his trial resulted in very significant alleviation of his pain, and does state that his function was significantly better.  We therefore did have a discussion with regards to placement of a permanent spinal cord stimulator.  After a full discussion regarding the risks and benefits associated with surgery, she did elect to proceed.     OPERATIVE DETAILS:  On 02/19/2023, the patient was brought to surgery and general endotracheal anesthesia was administered.  The patient was placed prone on a well-padded flat Jackson bed with a spinal frame. Antibiotics were given and the back was prepped and draped in the usual sterile fashion.  A time-out procedure was performed.  I then localized the T10/T11 intervertebral disc space.  An incision was made over the lower thoracic region, centered over this region.  A self-retaining  retractor was placed.  I then used a high-speed bur in addition to a series of Kerrison punches, in order to perform a laminectomy.  The laminectomy was extended as laterally as needed in order to accommodate the width of the paddle lead.  Once the ligamentum flavum was appropriately resected, and the dura was noted, I did develop a plane between the dorsal aspect of the dura and the lamina using a Woodson followed by a brain ribbon.  No resistance was encountered.  The leads were then advanced through the laminectomy site, and cephalad.  The leads were not able to be advanced to the intended goal, that being at the top of the vertebral body of T8.  I therefore did irrigate this wound and make an additional laminectomy over the T9/T10 intervertebral disc space.  As previously described, a high-speed bur in addition to a series of Kerrison punches were utilized, in order to accommodate the width of the paddle lead.  The lead was then advanced cephalad to just past the superior aspect of T8.  Once again, no resistance was encountered.  The paddle was over the midline.  I was very pleased with the resting position.  The leads were then tied to the paraspinal musculature using 3-0 silk ties. Satisfactory position was again confirmed on AP and lateral fluoroscopic imaging.    I then made a transverse incision over  the left flank.  I then tunneled across the subcutaneous layer, from the flank incision, to the incision over the lower thoracic region.  The leads were then passed through the tunneler, and delivered to the left flank wound.  All 4 leads were then secured into the battery.  At this point, the battery and the leads were placed into the subcutaneous layer in the region of the left flank.  The wounds were then irrigated.  I was very pleased with the resting position of the paddle and the battery.  Final images were obtained confirming appropriate positioning of the paddle.  Appropriate positioning of the spinal  cord stimulator was confirmed by the [employer], [place].    At this point, the fascia in the thoracic region was closed using #1 Vicryl.  Both incisions were then closed using 2-0 Vicryl followed by  4-0 Monocryl.  Benzoin and Steri-Strips were applied, followed by a sterile dressing.  All instrument counts were correct at the termination of the procedure.   Of note, Jason Coop, PA-C, was my assistant throughout surgery, and did aid in retraction, suctioning, and closure from start to finish.     Estill Bamberg, MD

## 2023-02-19 NOTE — Transfer of Care (Signed)
Immediate Anesthesia Transfer of Care Note  Patient: Gavin Maxwell  Procedure(s) Performed: THORACIC LAMINECTOMY AND PLACEMENT OF SPINAL CORD STIMULATOR AND BATTERY (Back)  Patient Location: PACU  Anesthesia Type:General  Level of Consciousness: awake, alert , and oriented  Airway & Oxygen Therapy: Patient Spontanous Breathing and Patient connected to face mask oxygen  Post-op Assessment: Report given to RN and Post -op Vital signs reviewed and stable  Post vital signs: Reviewed and stable  Last Vitals:  Vitals Value Taken Time  BP 136/75 02/19/23 1420  Temp 36.3 C 02/19/23 1420  Pulse 57 02/19/23 1425  Resp 7 02/19/23 1427  SpO2 98 % 02/19/23 1425  Vitals shown include unfiled device data.  Last Pain:  Vitals:   02/19/23 0857  PainSc: 0-No pain         Complications: No notable events documented.

## 2023-02-19 NOTE — Anesthesia Procedure Notes (Signed)
Procedure Name: Intubation Date/Time: 02/19/2023 11:40 AM  Performed by: Thomasene Ripple, CRNAPre-anesthesia Checklist: Patient identified, Emergency Drugs available, Suction available and Patient being monitored Patient Re-evaluated:Patient Re-evaluated prior to induction Oxygen Delivery Method: Circle System Utilized Preoxygenation: Pre-oxygenation with 100% oxygen Induction Type: IV induction Ventilation: Mask ventilation without difficulty Laryngoscope Size: Miller and 3 Grade View: Grade I Tube type: Oral Number of attempts: 1 Airway Equipment and Method: Stylet and Oral airway Placement Confirmation: ETT inserted through vocal cords under direct vision, positive ETCO2 and breath sounds checked- equal and bilateral Secured at: 23 cm Tube secured with: Tape Dental Injury: Teeth and Oropharynx as per pre-operative assessment

## 2023-02-19 NOTE — H&P (Signed)
PREOPERATIVE H&P  Chief Complaint: Low back pain, right leg pain  HPI: Gavin Maxwell is a 77 y.o. male who presents with ongoing pain in the back and right leg  Patient did undergo a spinal cord stimulator trial.  During the trial, the patient did report a very substantial relief of his back pain and right leg pain.  Patient has failed multiple forms of conservative care and continues to have pain (see office notes for additional details regarding the patient's full course of treatment)  Past Medical History:  Diagnosis Date   Ambulates with cane    straight 4 prong cane   Anxiety    Cancer (HCC)    non hodgins  lymphoma   2004 in remission  Left arm  wears a brace there is a bone broken   CKD (chronic kidney disease) stage 3, GFR 30-59 ml/min (HCC)    COPD (chronic obstructive pulmonary disease) (HCC)    Depression    Dysrhythmia    SVT   GERD (gastroesophageal reflux disease)    Hearing loss    wears hearing aids   History of hiatal hernia    Hypertension    Pneumonia    x 1   Primary localized osteoarthritis of knee 09/26/2014   Primary localized osteoarthrosis, shoulder region, rotator cuff arthropathy 10/04/2013   Sleep apnea    uses cpap nightly   Past Surgical History:  Procedure Laterality Date   CARDIAC CATHETERIZATION     20 yrs. ago   CARDIOVERSION N/A 06/03/2022   Procedure: CARDIOVERSION;  Surgeon: Antonieta Iba, MD;  Location: [facility];  Service: Cardiovascular;  Laterality: N/A;   CHOLECYSTECTOMY     EYE SURGERY Left    pt states he was "seeing double" and they did surgery to fix it   NASAL SINUS SURGERY     x2   REVERSE SHOULDER ARTHROPLASTY Right 10/04/2013   Procedure: REVERSE SHOULDER ARTHROPLASTY;  Surgeon: Eulas Post, MD;  Location: [facility];  Service: Orthopedics;  Laterality: Right;   SHOULDER ACROMIOPLASTY Left    x 5 shoulder surgeries   SVT ABLATION N/A 03/07/2019   Procedure: SVT ABLATION;  Surgeon: Regan Lemming, MD;   Location: [facility];  Service: Cardiovascular;  Laterality: N/A;   TEE WITHOUT CARDIOVERSION N/A 06/03/2022   Procedure: TRANSESOPHAGEAL ECHOCARDIOGRAM;  Surgeon: Antonieta Iba, MD;  Location: [facility];  Service: Cardiovascular;  Laterality: N/A;   TOTAL HIP ARTHROPLASTY Right 07/26/2019   Procedure: TOTAL HIP ARTHROPLASTY;  Surgeon: Teryl Lucy, MD;  Location: [facility];  Service: Orthopedics;  Laterality: Right;   TOTAL HIP ARTHROPLASTY Left 1998   TOTAL KNEE ARTHROPLASTY Right 09/26/2014   Procedure: RIGHT TOTAL KNEE ARTHROPLASTY;  Surgeon: Teryl Lucy, MD;  Location: [facility];  Service: Orthopedics;  Laterality: Right;   TRANSFORAMINAL LUMBAR INTERBODY FUSION (TLIF) WITH PEDICLE SCREW FIXATION 2 LEVEL Right 06/05/2021   Procedure: RIGHT-SIDED LUMBAR 3- LUMBAR 4, LUMBAR 4- LUMBAR 5 TRANSFORAMINAL LUMBAR INTERBODY FUSION AND DECOMPRESSION WITH INSTRUMENTATION AND ALLOGRAFT;  Surgeon: Estill Bamberg, MD;  Location: [facility];  Service: Orthopedics;  Laterality: Right;   VIDEO ASSISTED THORACOSCOPY (VATS) W/TALC PLEUADESIS Right 08/18/2017   Procedure: VIDEO ASSISTED THORACOSCOPY (VATS)POSSIBLE  W/TALC PLEUADESIS.POSSIBLE BLEBECTOMY;  Surgeon: Hulda Marin, MD;  Location: [facility];  Service: Thoracic;  Laterality: Right;   Social History   Socioeconomic History   Marital status: Married    Spouse name: Not on file   Number of children: 2  Years of education: Not on file   Highest education level: Not on file  Occupational History   Not on file  Tobacco Use   Smoking status: Former    Current packs/day: 0.00    Average packs/day: 2.0 packs/day for 20.0 years (40.0 ttl pk-yrs)    Types: Cigarettes    Start date: 36    Quit date: 67    Years since quitting: 34.8   Smokeless tobacco: Never   Tobacco comments:    quit 25 years ago  Vaping Use   Vaping status: Never Used  Substance and Sexual Activity   Alcohol use: No   Drug use: No   Sexual activity: Not Currently     Birth control/protection: None  Other Topics Concern   Not on file  Social History Narrative   Not on file   Social Determinants of Health   Financial Resource Strain: Not on file  Food Insecurity: No Food Insecurity (05/28/2022)   Hunger Vital Sign    Worried About Running Out of Food in the Last Year: Never true    Ran Out of Food in the Last Year: Never true  Transportation Needs: No Transportation Needs (05/28/2022)   PRAPARE - Administrator, Civil Service (Medical): No    Lack of Transportation (Non-Medical): No  Physical Activity: Not on file  Stress: Not on file  Social Connections: Not on file   Family History  Problem Relation Age of Onset   Breast cancer Mother    Hypertension Mother    Rheum arthritis Father    Cancer Father    Lung cancer Brother    Stroke Paternal Grandfather    Allergies  Allergen Reactions   Amoxicillin-Pot Clavulanate Nausea Only and Other (See Comments)    Did it involve swelling of the face/tongue/throat, SOB, or low BP? No  Did it involve sudden or severe rash/hives, skin peeling, or any reaction on the inside of your mouth or nose? No  Did you need to seek medical attention at a hospital or doctor's office? No  When did it last happen? More than 10 years  If all above answers are "NO", may proceed with cephalosporin use.  Did it involve swelling of the face/tongue/throat, SOB, or low BP? No Did it involve sudden or severe rash/hives, skin peeling, or any reaction on the inside of your mouth or nose? No Did you need to seek medical attention at a hospital or doctor's office? No When did it last happen? More than 10 years If all above answers are "NO", may proceed with cephalosporin use.   Celecoxib Nausea Only and Other (See Comments)    Upset stomach   Oxycodone Other (See Comments)    Confusion, makes him "crazy"  Confusion, makes him "crazy"    Confusion, makes him "crazy" Confusion, makes him "crazy"   Prior to  Admission medications   Medication Sig Start Date End Date Taking? Authorizing Provider  acetaminophen (TYLENOL) 500 MG tablet Take 1,000 mg by mouth every 6 (six) hours as needed for moderate pain (pain score 4-6).   Yes [provider]  albuterol (PROVENTIL HFA;VENTOLIN HFA) 108 (90 Base) MCG/ACT inhaler Inhale 2 puffs into the lungs every 6 (six) hours as needed for wheezing or shortness of breath. 11/03/17  Yes Merwyn Katos, MD  amiodarone (PACERONE) 200 MG tablet Take 1 tablet (200 mg total) by mouth daily. 06/27/22  Yes Gollan, Tollie Pizza, MD  apixaban (ELIQUIS) 5 MG TABS tablet Take  1 tablet by mouth twice daily 09/15/22  Yes Gollan, Tollie Pizza, MD  atorvastatin (LIPITOR) 80 MG tablet Take 1 tablet by mouth once daily 09/08/22  Yes Gollan, Tollie Pizza, MD  brimonidine-timolol (COMBIGAN) 0.2-0.5 % ophthalmic solution Place 1 drop into both eyes every 12 (twelve) hours.   Yes [provider]  Calcium Carb-Cholecalciferol (CALCIUM 600 + D PO) Take 1 tablet by mouth daily.   Yes [provider]  FARXIGA 10 MG TABS tablet Take 1 tablet (10 mg total) by mouth daily. PLEASE CALL [phone] TO SCHEDULE 6 MONTH FOLLOW UP VISIT. THANK YOU. 12/18/22  Yes Antonieta Iba, MD  fenofibrate 160 MG tablet Take 160 mg by mouth daily.    Yes [provider]  ferrous sulfate 325 (65 FE) MG tablet Take 325 mg by mouth daily.   Yes [provider]  fluticasone (FLONASE) 50 MCG/ACT nasal spray Place 2 sprays into both nostrils daily.    Yes [provider]  gabapentin (NEURONTIN) 300 MG capsule Take 300 mg by mouth 3 (three) times daily.   Yes [provider]  imipramine (TOFRANIL) 25 MG tablet Take 25 mg by mouth at bedtime.   Yes [provider]  LUMIGAN 0.01 % SOLN Place 1 drop into both eyes at bedtime.   Yes [provider]  magnesium oxide (MAG-OX) 400 MG tablet Take 400 mg by mouth 3 (three) times daily. 01/23/19  Yes [provider]  Multiple Vitamin (MULTIVITAMIN WITH MINERALS) TABS tablet Take 1 tablet by mouth daily.   Yes [provider]  pantoprazole (PROTONIX) 40 MG tablet Take 40 mg by mouth daily.    Yes [provider]  polycarbophil (FIBERCON) 625 MG tablet Take 625 mg by mouth 2 (two) times daily.   Yes [provider]  sertraline (ZOLOFT) 50 MG tablet Take 50 mg by mouth at bedtime.   Yes [provider]  Budeson-Glycopyrrol-Formoterol (BREZTRI AEROSPHERE) 160-9-4.8 MCG/ACT AERO Inhale 2 puffs into the lungs in the morning and at bedtime. Patient not taking: Reported on 02/10/2023 10/17/22   Raechel Chute, MD  cephALEXin (KEFLEX) 500 MG capsule Take 1 capsule (500 mg total) by mouth 2 (two) times daily. Patient not taking: Reported on 02/10/2023 12/26/22   Tyrell Antonio, MD  furosemide (LASIX) 20 MG tablet Take 20 mg tablet once a week on Saturday & an additional 20 mg tablet as needed.    [provider]  metoprolol succinate (TOPROL-XL) 25 MG 24 hr tablet Take 1 tablet by mouth in the evening 02/11/23   Antonieta Iba, MD     All other systems have been reviewed and were otherwise negative with the exception of those mentioned in the HPI and as above.  Physical Exam: Vitals:   02/19/23 0756  BP: (!) 109/57  Pulse: 75  Resp: 18  Temp: (!) 97.5 F (36.4 C)  SpO2: 96%    Body mass index is 31.38 kg/m.  General: Alert, no acute distress Cardiovascular: No pedal edema Respiratory: No cyanosis, no use of accessory musculature Skin: No lesions in the area of chief complaint Neurologic: Sensation intact distally Psychiatric: Patient is competent for consent with normal mood and affect Lymphatic: No axillary or cervical lymphadenopathy  Assessment/Plan: CHRONIC PAIN SYNDROME Plan for Procedure(s): THORACIC LAMINECTOMY AND PLACEMENT OF SPINAL CORD STIMULATOR AND BATTERY   Jackelyn Hoehn, MD 02/19/2023 8:05 AM

## 2023-02-19 NOTE — Addendum Note (Signed)
Addendum  created 02/19/23 1819 by Thomasene Ripple, CRNA   Intraprocedure Meds edited

## 2023-02-20 ENCOUNTER — Encounter (HOSPITAL_COMMUNITY): Payer: Self-pay | Admitting: Orthopedic Surgery

## 2023-03-02 ENCOUNTER — Other Ambulatory Visit: Payer: Self-pay | Admitting: Cardiovascular Disease

## 2023-03-02 IMAGING — US US EXTREM LOW VENOUS*R*
1 series · 13 of 24 positions shown · non-contrast
Comparison: August 20, 2017.

CLINICAL DATA: Right thigh pain for several weeks. History of
lymphoma.

EXAM:
Right LOWER EXTREMITY VENOUS DOPPLER ULTRASOUND
TECHNIQUE: Gray-scale sonography with compression, as well as color and duplex
ultrasound, were performed to evaluate the deep venous system(s)
from the level of the common femoral vein through the popliteal and
proximal calf veins.

[Series 1: us venous img lower uni right (dvt) · portal-venous · 45 acquisitions, 13 frames shown]
[im 1/45]
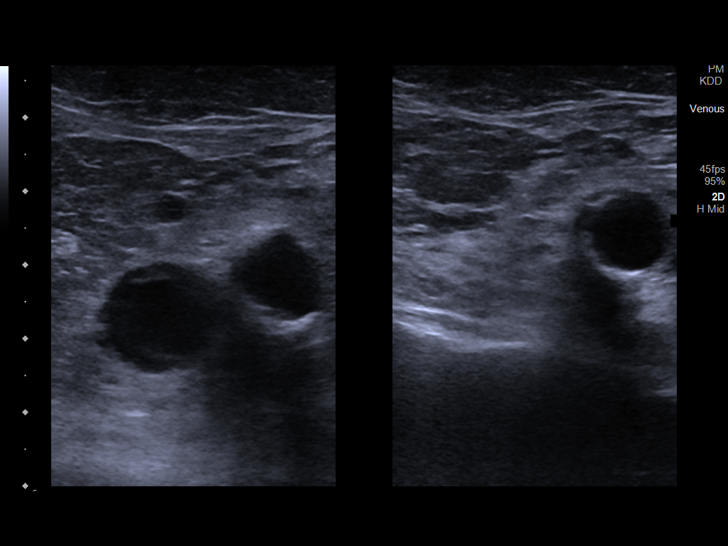
[im 4/45]
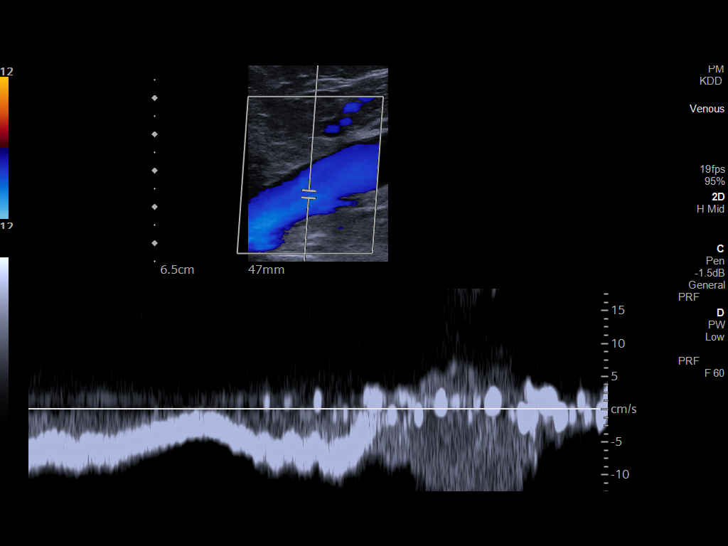
[im 8/45]
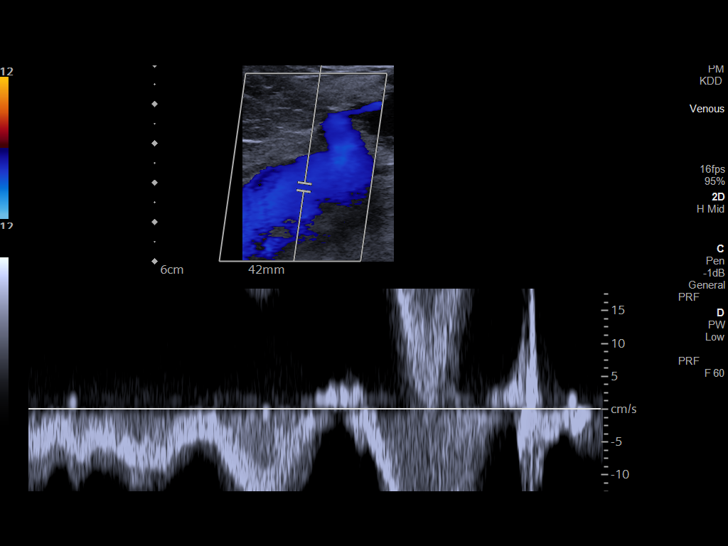
[im 14/45]
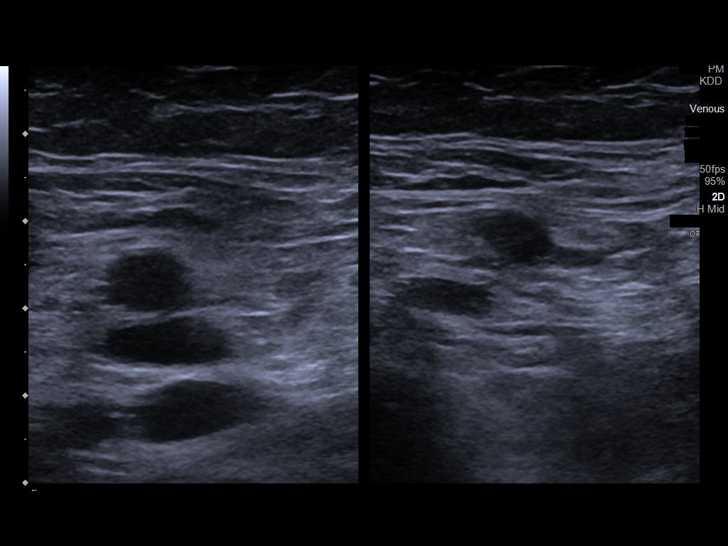
[im 18/45]
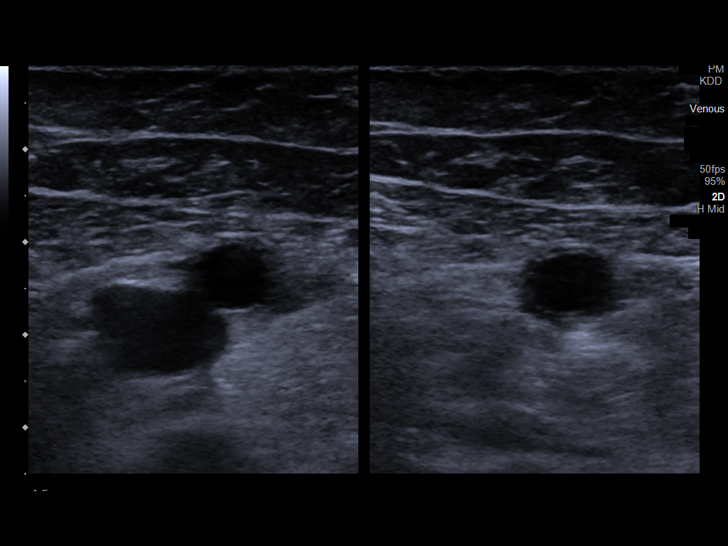
[im 22/45]
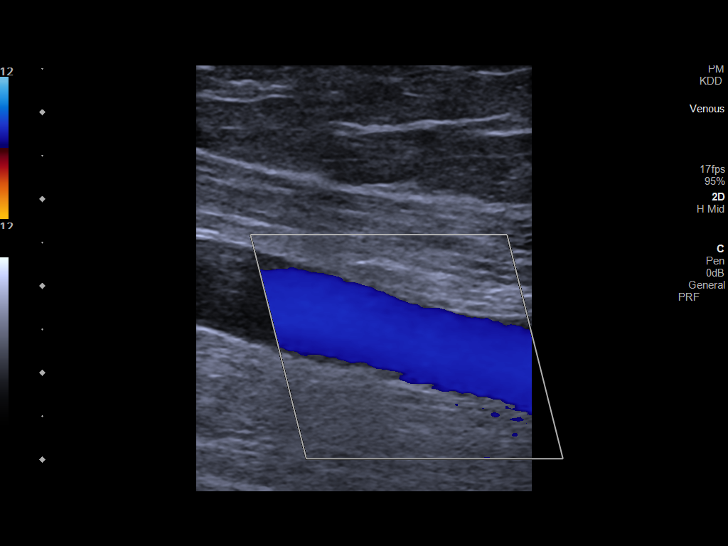
[im 25/45]
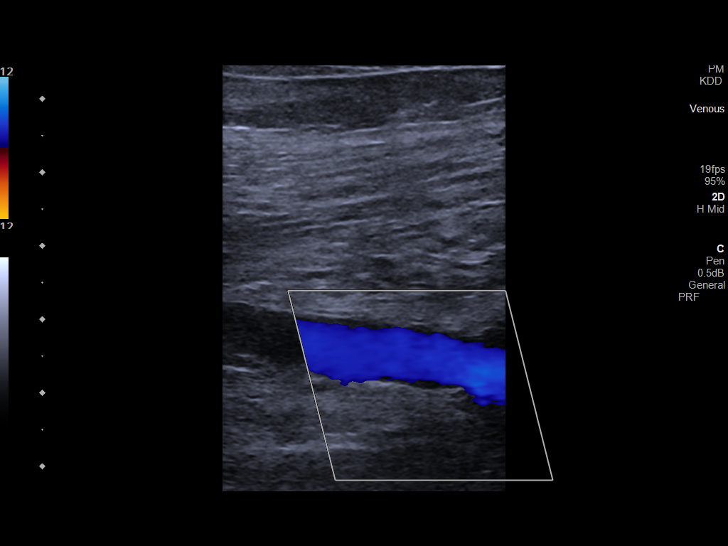
[im 27/45]
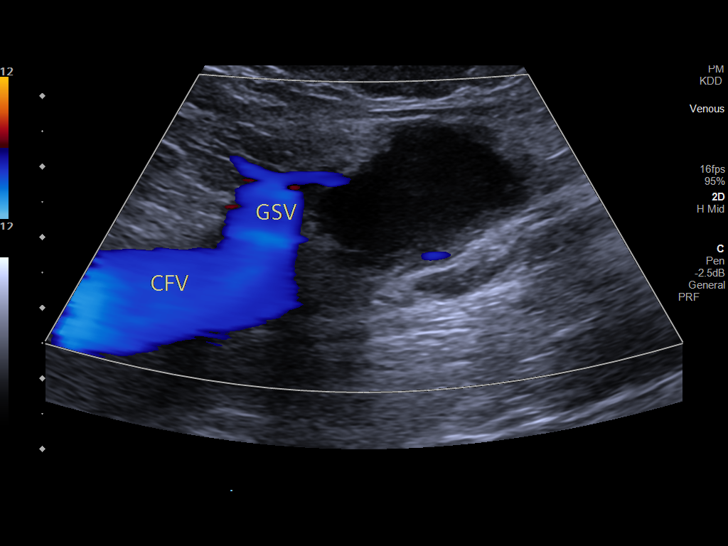
[im 31/45]
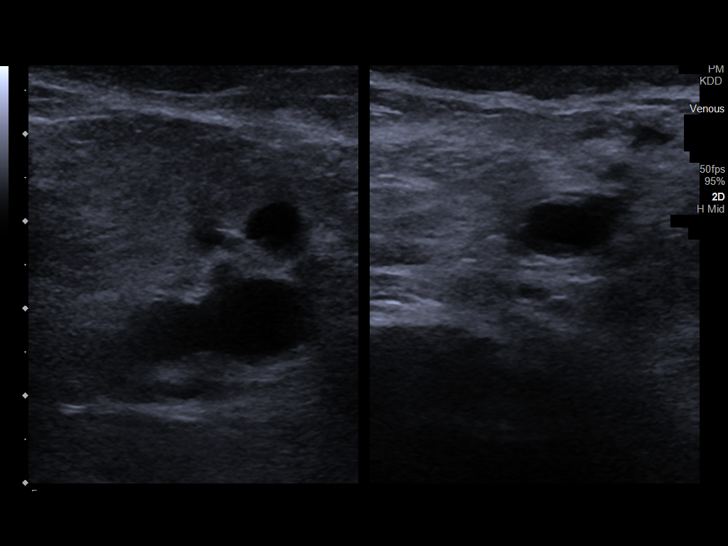
[im 35/45]
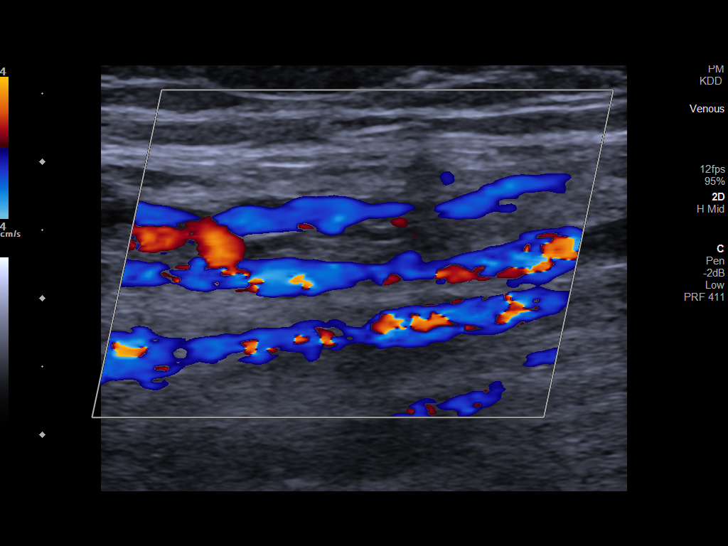
[im 41/45]
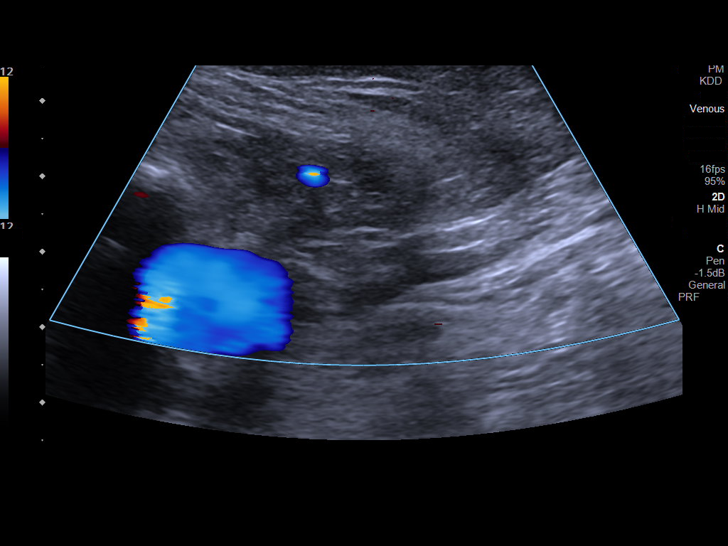
[im 43/45]
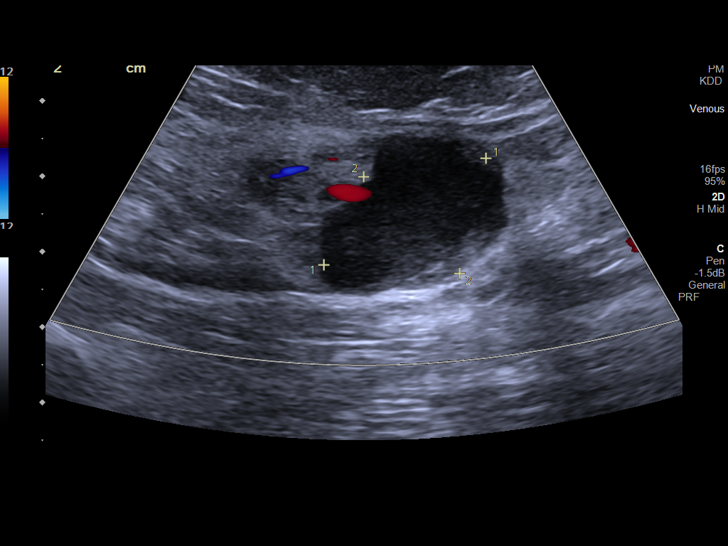
[im 45/45]
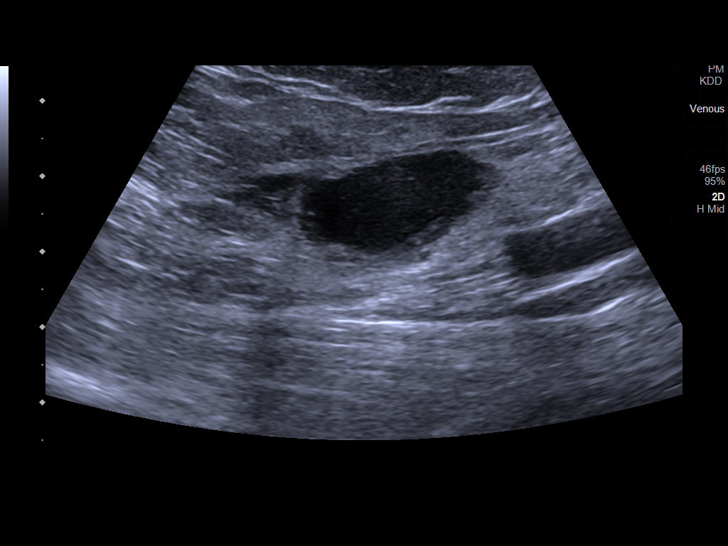

[13 of 24 positions shown; findings below may reference images not displayed]

FINDINGS: VENOUS

Normal compressibility of the common femoral, superficial femoral,
and popliteal veins, as well as the visualized calf veins.
Visualized portions of profunda femoral vein and great saphenous
vein unremarkable. No filling defects to suggest DVT on grayscale or
color Doppler imaging. Doppler waveforms show normal direction of
venous flow, normal respiratory plasticity and response to
augmentation.

Limited views of the contralateral common femoral vein are
unremarkable.

OTHER

Complex hypoechoic area measuring 3.0 x 1.9 x 1.8 cm is noted in the
right groin which may represent necrotic lymph node. CT scan is
recommended for further evaluation.

Limitations: none
IMPRESSION: No definite evidence of deep venous thrombosis seen in right lower
extremity. Possible 3.0 x 1.9 x 1.8 cm necrotic lymph node seen in
right inguinal region; CT scan is recommended for further
evaluation.

## 2023-03-03 DIAGNOSIS — I129 Hypertensive chronic kidney disease with stage 1 through stage 4 chronic kidney disease, or unspecified chronic kidney disease: Secondary | ICD-10-CM | POA: Diagnosis not present

## 2023-03-03 DIAGNOSIS — R600 Localized edema: Secondary | ICD-10-CM | POA: Diagnosis not present

## 2023-03-03 DIAGNOSIS — N1832 Chronic kidney disease, stage 3b: Secondary | ICD-10-CM | POA: Diagnosis not present

## 2023-03-04 DIAGNOSIS — N1832 Chronic kidney disease, stage 3b: Secondary | ICD-10-CM | POA: Diagnosis not present

## 2023-03-04 DIAGNOSIS — I129 Hypertensive chronic kidney disease with stage 1 through stage 4 chronic kidney disease, or unspecified chronic kidney disease: Secondary | ICD-10-CM | POA: Diagnosis not present

## 2023-03-04 DIAGNOSIS — R600 Localized edema: Secondary | ICD-10-CM | POA: Diagnosis not present

## 2023-03-04 DIAGNOSIS — G894 Chronic pain syndrome: Secondary | ICD-10-CM | POA: Diagnosis not present

## 2023-03-11 DIAGNOSIS — N1832 Chronic kidney disease, stage 3b: Secondary | ICD-10-CM | POA: Diagnosis not present

## 2023-03-11 DIAGNOSIS — I129 Hypertensive chronic kidney disease with stage 1 through stage 4 chronic kidney disease, or unspecified chronic kidney disease: Secondary | ICD-10-CM | POA: Diagnosis not present

## 2023-03-11 DIAGNOSIS — R600 Localized edema: Secondary | ICD-10-CM | POA: Diagnosis not present

## 2023-03-13 DIAGNOSIS — H401133 Primary open-angle glaucoma, bilateral, severe stage: Secondary | ICD-10-CM | POA: Diagnosis not present

## 2023-03-14 ENCOUNTER — Other Ambulatory Visit: Payer: Self-pay | Admitting: Cardiovascular Disease

## 2023-03-16 DIAGNOSIS — G319 Degenerative disease of nervous system, unspecified: Secondary | ICD-10-CM | POA: Diagnosis not present

## 2023-03-16 DIAGNOSIS — I4891 Unspecified atrial fibrillation: Secondary | ICD-10-CM | POA: Diagnosis not present

## 2023-03-16 DIAGNOSIS — E669 Obesity, unspecified: Secondary | ICD-10-CM | POA: Diagnosis not present

## 2023-03-16 DIAGNOSIS — D6869 Other thrombophilia: Secondary | ICD-10-CM | POA: Diagnosis not present

## 2023-03-16 DIAGNOSIS — I509 Heart failure, unspecified: Secondary | ICD-10-CM | POA: Diagnosis not present

## 2023-03-16 DIAGNOSIS — I739 Peripheral vascular disease, unspecified: Secondary | ICD-10-CM | POA: Diagnosis not present

## 2023-03-16 DIAGNOSIS — C8590 Non-Hodgkin lymphoma, unspecified, unspecified site: Secondary | ICD-10-CM | POA: Diagnosis not present

## 2023-03-16 DIAGNOSIS — I110 Hypertensive heart disease with heart failure: Secondary | ICD-10-CM | POA: Diagnosis not present

## 2023-03-16 DIAGNOSIS — E261 Secondary hyperaldosteronism: Secondary | ICD-10-CM | POA: Diagnosis not present

## 2023-03-16 DIAGNOSIS — E785 Hyperlipidemia, unspecified: Secondary | ICD-10-CM | POA: Diagnosis not present

## 2023-03-16 DIAGNOSIS — F339 Major depressive disorder, recurrent, unspecified: Secondary | ICD-10-CM | POA: Diagnosis not present

## 2023-03-16 DIAGNOSIS — J449 Chronic obstructive pulmonary disease, unspecified: Secondary | ICD-10-CM | POA: Diagnosis not present

## 2023-03-16 NOTE — Telephone Encounter (Signed)
Prescription refill request for Eliquis received. Indication:afib Last office visit:11/24 Scr:2.15  11/24 Age: 77 Weight:102.1  kg  Prescription refilled

## 2023-03-20 ENCOUNTER — Other Ambulatory Visit: Payer: Self-pay | Admitting: Cardiovascular Disease

## 2023-03-20 DIAGNOSIS — H401133 Primary open-angle glaucoma, bilateral, severe stage: Secondary | ICD-10-CM | POA: Diagnosis not present

## 2023-04-14 DIAGNOSIS — Z79899 Other long term (current) drug therapy: Secondary | ICD-10-CM | POA: Diagnosis not present

## 2023-04-14 DIAGNOSIS — G4733 Obstructive sleep apnea (adult) (pediatric): Secondary | ICD-10-CM | POA: Diagnosis not present

## 2023-04-14 DIAGNOSIS — R059 Cough, unspecified: Secondary | ICD-10-CM | POA: Diagnosis not present

## 2023-04-14 DIAGNOSIS — J019 Acute sinusitis, unspecified: Secondary | ICD-10-CM | POA: Diagnosis not present

## 2023-04-14 DIAGNOSIS — I10 Essential (primary) hypertension: Secondary | ICD-10-CM | POA: Diagnosis not present

## 2023-04-14 DIAGNOSIS — J329 Chronic sinusitis, unspecified: Secondary | ICD-10-CM | POA: Diagnosis not present

## 2023-04-14 DIAGNOSIS — E782 Mixed hyperlipidemia: Secondary | ICD-10-CM | POA: Diagnosis not present

## 2023-04-14 DIAGNOSIS — J320 Chronic maxillary sinusitis: Secondary | ICD-10-CM | POA: Diagnosis not present

## 2023-04-14 DIAGNOSIS — H6121 Impacted cerumen, right ear: Secondary | ICD-10-CM | POA: Diagnosis not present

## 2023-04-21 DIAGNOSIS — F411 Generalized anxiety disorder: Secondary | ICD-10-CM | POA: Diagnosis not present

## 2023-04-21 DIAGNOSIS — N1832 Chronic kidney disease, stage 3b: Secondary | ICD-10-CM | POA: Diagnosis not present

## 2023-04-21 DIAGNOSIS — J439 Emphysema, unspecified: Secondary | ICD-10-CM | POA: Diagnosis not present

## 2023-04-21 DIAGNOSIS — I5020 Unspecified systolic (congestive) heart failure: Secondary | ICD-10-CM | POA: Diagnosis not present

## 2023-04-21 DIAGNOSIS — E782 Mixed hyperlipidemia: Secondary | ICD-10-CM | POA: Diagnosis not present

## 2023-04-21 DIAGNOSIS — I10 Essential (primary) hypertension: Secondary | ICD-10-CM | POA: Diagnosis not present

## 2023-04-21 DIAGNOSIS — I480 Paroxysmal atrial fibrillation: Secondary | ICD-10-CM | POA: Diagnosis not present

## 2023-04-21 DIAGNOSIS — Z79899 Other long term (current) drug therapy: Secondary | ICD-10-CM | POA: Diagnosis not present

## 2023-04-21 DIAGNOSIS — N183 Chronic kidney disease, stage 3 unspecified: Secondary | ICD-10-CM | POA: Diagnosis not present

## 2023-04-21 DIAGNOSIS — R0781 Pleurodynia: Secondary | ICD-10-CM | POA: Diagnosis not present

## 2023-04-21 DIAGNOSIS — Z1211 Encounter for screening for malignant neoplasm of colon: Secondary | ICD-10-CM | POA: Diagnosis not present

## 2023-04-21 DIAGNOSIS — Z0000 Encounter for general adult medical examination without abnormal findings: Secondary | ICD-10-CM | POA: Diagnosis not present

## 2023-04-29 DIAGNOSIS — Z1211 Encounter for screening for malignant neoplasm of colon: Secondary | ICD-10-CM | POA: Diagnosis not present

## 2023-05-08 ENCOUNTER — Other Ambulatory Visit: Payer: Self-pay | Admitting: Cardiovascular Disease

## 2023-05-08 DIAGNOSIS — G4733 Obstructive sleep apnea (adult) (pediatric): Secondary | ICD-10-CM | POA: Diagnosis not present

## 2023-05-12 DIAGNOSIS — Z1211 Encounter for screening for malignant neoplasm of colon: Secondary | ICD-10-CM | POA: Diagnosis not present

## 2023-05-28 ENCOUNTER — Telehealth: Payer: Self-pay | Admitting: Cardiovascular Disease

## 2023-05-28 NOTE — Telephone Encounter (Signed)
Per OV note: When you turn 78 years old, We will decrease the eliquis down to 2.5 mg twice a day    Okay to update RX. Thanks!

## 2023-05-28 NOTE — Telephone Encounter (Signed)
Pt c/o medication issue:  1. Name of Medication: apixaban (ELIQUIS) 5 MG TABS tablet   2. How are you currently taking this medication (dosage and times per day)? As written  3. Are you having a reaction (difficulty breathing--STAT)? no  4. What is your medication issue? Per Patient Dr Mariah Milling told pt when he turned 78 to cut med in half

## 2023-05-29 NOTE — Telephone Encounter (Signed)
Called patient, advised last OV was back in November, just wanted to clarify with MD okay to decrease down to 2.5 mg Eliquis.  Advised we would call back with response once we had it.   Patient verbalized understanding, thankful for call back.

## 2023-05-29 NOTE — Telephone Encounter (Signed)
  Patient is calling back to follow up on what dosage to take

## 2023-05-30 ENCOUNTER — Emergency Department: Payer: PPO

## 2023-05-30 ENCOUNTER — Other Ambulatory Visit: Payer: Self-pay

## 2023-05-30 ENCOUNTER — Emergency Department
Admission: EM | Admit: 2023-05-30 | Discharge: 2023-05-30 | Disposition: A | Discharge status: Home / Self Care | Payer: PPO | Attending: Emergency Medicine | Admitting: Emergency Medicine

## 2023-05-30 DIAGNOSIS — S0990XA Unspecified injury of head, initial encounter: Secondary | ICD-10-CM

## 2023-05-30 DIAGNOSIS — S0093XA Contusion of unspecified part of head, initial encounter: Secondary | ICD-10-CM | POA: Diagnosis not present

## 2023-05-30 DIAGNOSIS — W19XXXA Unspecified fall, initial encounter: Secondary | ICD-10-CM | POA: Diagnosis not present

## 2023-05-30 DIAGNOSIS — M542 Cervicalgia: Secondary | ICD-10-CM | POA: Diagnosis not present

## 2023-05-30 DIAGNOSIS — J439 Emphysema, unspecified: Secondary | ICD-10-CM | POA: Diagnosis not present

## 2023-05-30 DIAGNOSIS — S199XXA Unspecified injury of neck, initial encounter: Secondary | ICD-10-CM | POA: Diagnosis not present

## 2023-05-30 DIAGNOSIS — I6782 Cerebral ischemia: Secondary | ICD-10-CM | POA: Diagnosis not present

## 2023-05-30 DIAGNOSIS — Z7901 Long term (current) use of anticoagulants: Secondary | ICD-10-CM | POA: Diagnosis not present

## 2023-05-30 DIAGNOSIS — M8580 Other specified disorders of bone density and structure, unspecified site: Secondary | ICD-10-CM | POA: Diagnosis not present

## 2023-05-30 DIAGNOSIS — M4312 Spondylolisthesis, cervical region: Secondary | ICD-10-CM | POA: Diagnosis not present

## 2023-05-30 LAB — CBC WITH DIFFERENTIAL/PLATELET
Abs Immature Granulocytes: 0.02 10*3/uL (ref 0.00–0.07)
Basophils Absolute: 0 10*3/uL (ref 0.0–0.1)
Basophils Relative: 1 %
Eosinophils Absolute: 0.2 10*3/uL (ref 0.0–0.5)
Eosinophils Relative: 5 %
HCT: 35.3 % — ABNORMAL LOW (ref 39.0–52.0)
Hemoglobin: 11.3 g/dL — ABNORMAL LOW (ref 13.0–17.0)
Immature Granulocytes: 0 %
Lymphocytes Relative: 23 %
Lymphs Abs: 1.3 10*3/uL (ref 0.7–4.0)
MCH: 31.9 pg (ref 26.0–34.0)
MCHC: 32 g/dL (ref 30.0–36.0)
MCV: 99.7 fL (ref 80.0–100.0)
Monocytes Absolute: 0.6 10*3/uL (ref 0.1–1.0)
Monocytes Relative: 12 %
Neutro Abs: 3.2 10*3/uL (ref 1.7–7.7)
Neutrophils Relative %: 59 %
Platelets: 247 10*3/uL (ref 150–400)
RBC: 3.54 MIL/uL — ABNORMAL LOW (ref 4.22–5.81)
RDW: 14.3 % (ref 11.5–15.5)
WBC: 5.4 10*3/uL (ref 4.0–10.5)
nRBC: 0 % (ref 0.0–0.2)

## 2023-05-30 LAB — BASIC METABOLIC PANEL
Anion gap: 11 (ref 5–15)
BUN: 39 mg/dL — ABNORMAL HIGH (ref 8–23)
CO2: 26 mmol/L (ref 22–32)
Calcium: 9 mg/dL (ref 8.9–10.3)
Chloride: 100 mmol/L (ref 98–111)
Creatinine, Ser: 1.99 mg/dL — ABNORMAL HIGH (ref 0.61–1.24)
GFR, Estimated: 34 mL/min — ABNORMAL LOW (ref >60)
Glucose, Bld: 103 mg/dL — ABNORMAL HIGH (ref 70–99)
Potassium: 4.4 mmol/L (ref 3.5–5.1)
Sodium: 137 mmol/L (ref 135–145)

## 2023-05-30 LAB — PROTIME-INR
INR: 1.4 — ABNORMAL HIGH (ref 0.8–1.2)
Prothrombin Time: 17.2 s — ABNORMAL HIGH (ref 11.4–15.2)

## 2023-05-30 NOTE — ED Triage Notes (Signed)
 Pt reports mechanical fall, hitting the back of his head on the ground, No LOC. Pt is on eliquis for A Fib.

## 2023-05-30 NOTE — Discharge Instructions (Signed)
 You were seen in the emergency department today after a head injury. Fortunately, your exam and CT scan were overall reassuring. It is still possible that you have a concussion. I have included more information about this in your paperwork. Please follow-up with your primary care doctor within a few days for reevaluation. Return to the ER for any worsening symptoms including worsening headache, confusion, or any other new or concerning symptoms.

## 2023-05-30 NOTE — ED Notes (Signed)
Pt verbalizes understanding of discharge instructions. Opportunity for questioning and answers were provided. Pt discharged from ED to home with daughter.    

## 2023-05-30 NOTE — ED Provider Notes (Signed)
 [facility] Provider Note    Event Date/Time   First MD Initiated Contact with Patient 05/30/23 1621     (approximate)   History   Fall   HPI  Gavin Maxwell is a 78 year old with history of A-fib on Eliquis male presenting to the emergency department for evaluation after a fall.  Patient was getting into his truck when he lost his balance and fell backwards and hit the back of his head.  Denies preceding chest pain or shortness of breath.  No reported LOC.  Presented to the ER as he is on Eliquis and was told to do so anytime he had a head injury.  Reports mild pain over the back of his neck, denies pain in other areas.     Physical Exam   Triage Vital Signs: ED Triage Vitals  Encounter Vitals Group     BP 05/30/23 1522 (!) 148/85     Systolic BP Percentile --      Diastolic BP Percentile --      Pulse Rate 05/30/23 1522 62     Resp 05/30/23 1522 16     Temp --      Temp src --      SpO2 05/30/23 1522 100 %     Weight 05/30/23 1523 220 lb (99.8 kg)     Height 05/30/23 1523 5\' 11"  (1.803 m)     Head Circumference --      Peak Flow --      Pain Score 05/30/23 1523 2     Pain Loc --      Pain Education --      Exclude from Growth Chart --     Most recent vital signs: Vitals:   05/30/23 1522 05/30/23 1700  BP: (!) 148/85 (!) 166/87  Pulse: 62 (!) 59  Resp: 16 18  SpO2: 100% 100%    Nursing notes and vital signs reviewed.  General: Adult male, laying in bed, awake interactive Head: Palpable area of swelling consistent with hematoma over the back of his head without open areas of skin, no other visible trauma Back: Mild tenderness over the cervical region without point tenderness over Chest: Symmetric chest rise, no tenderness to palpation.  Cardiac: Regular rhythm and rate.  Respiratory: Lungs clear to auscultation Abdomen: Soft, nondistended. No tenderness to palpation.  Pelvis: Stable in AP and lateral compression. No tenderness to  palpation. MSK: No deformity to bilateral upper and lower extremity.  Longstanding brace in place over left upper extremity.  Ranging extremities without pain.   Neuro: Alert, oriented. GCS 15.   Skin: No evidence of burns or lacerations.  ED Results / Procedures / Treatments   Labs (all labs ordered are listed, but only abnormal results are displayed) Labs Reviewed  CBC WITH DIFFERENTIAL/PLATELET - Abnormal; Notable for the following components:      Result Value   RBC 3.54 (*)    Hemoglobin 11.3 (*)    HCT 35.3 (*)    All other components within normal limits  BASIC METABOLIC PANEL - Abnormal; Notable for the following components:   Glucose, Bld 103 (*)    BUN 39 (*)    Creatinine, Ser 1.99 (*)    GFR, Estimated 34 (*)    All other components within normal limits  PROTIME-INR - Abnormal; Notable for the following components:   Prothrombin Time 17.2 (*)    INR 1.4 (*)    All other components within normal limits  EKG EKG independently reviewed interpreted by myself (ER attending) demonstrates:    RADIOLOGY Imaging independently reviewed and interpreted by myself demonstrates:  CT head without acute bleed, right parieto-occipital scalp swelling noted without underlying fracture CT C-spine without acute fracture  PROCEDURES:  Critical Care performed: No  Procedures   MEDICATIONS ORDERED IN ED: Medications - No data to display   IMPRESSION / MDM / ASSESSMENT AND PLAN / ED COURSE  I reviewed the triage vital signs and the nursing notes.  Differential diagnosis includes, but is not limited to, intracranial bleed, skull fracture, neck fracture, no evidence of thoracoabdominal trauma  Patient's presentation is most consistent with acute presentation with potential threat to life or bodily function.  78 year old male presenting after a fall.  Labs from triage with stable anemia and renal dysfunction.  CT head and C-spine fortunately without acute traumatic injury.   Patient without new injuries on reevaluation.  He is comfortable discharge home.  Strict return precautions provided.     FINAL CLINICAL IMPRESSION(S) / ED DIAGNOSES   Final diagnoses:  Closed head injury, initial encounter     Rx / DC Orders   ED Discharge Orders     None        Note:  This document was prepared using Dragon voice recognition software and may include unintentional dictation errors.   [name], MD 05/30/23 [phone]

## 2023-06-01 MED ORDER — APIXABAN 2.5 MG PO TABS: 2.5000 mg | ORAL_TABLET | Freq: Two times a day (BID) | ORAL | 3 refills | Status: AC

## 2023-06-01 NOTE — Addendum Note (Signed)
 Addended by: Jani Gravel on: 06/01/2023 10:05 AM   Modules accepted: Orders

## 2023-06-01 NOTE — Telephone Encounter (Signed)
 Called and spoke with patient. Notified him of the following from Dr. Mariah Milling.  We can decrease Eliquis down to 2.5 mg twice a day given age 78 and creatinine over 1.5  Happy birthday to him!  Thx  TG   Patient verbalizes understanding. Prescription sent to preferred pharmacy.

## 2023-06-05 DIAGNOSIS — G4733 Obstructive sleep apnea (adult) (pediatric): Secondary | ICD-10-CM | POA: Diagnosis not present

## 2023-06-08 ENCOUNTER — Inpatient Hospital Stay (HOSPITAL_COMMUNITY): Admit: 2023-06-08 | Admitting: Internal Medicine

## 2023-06-08 ENCOUNTER — Emergency Department

## 2023-06-08 ENCOUNTER — Encounter (HOSPITAL_COMMUNITY): Payer: Self-pay

## 2023-06-08 ENCOUNTER — Other Ambulatory Visit: Payer: Self-pay

## 2023-06-08 ENCOUNTER — Emergency Department
Admission: EM | Admit: 2023-06-08 | Discharge: 2023-06-09 | Disposition: A | Discharge status: Nursing Home (Includes ICF) | Attending: Emergency Medicine | Admitting: Emergency Medicine

## 2023-06-08 DIAGNOSIS — N189 Chronic kidney disease, unspecified: Secondary | ICD-10-CM | POA: Diagnosis not present

## 2023-06-08 DIAGNOSIS — T84021A Dislocation of internal left hip prosthesis, initial encounter: Secondary | ICD-10-CM | POA: Insufficient documentation

## 2023-06-08 DIAGNOSIS — S73015A Posterior dislocation of left hip, initial encounter: Secondary | ICD-10-CM

## 2023-06-08 DIAGNOSIS — S73005D Unspecified dislocation of left hip, subsequent encounter: Secondary | ICD-10-CM

## 2023-06-08 DIAGNOSIS — I129 Hypertensive chronic kidney disease with stage 1 through stage 4 chronic kidney disease, or unspecified chronic kidney disease: Secondary | ICD-10-CM | POA: Diagnosis not present

## 2023-06-08 DIAGNOSIS — Z981 Arthrodesis status: Secondary | ICD-10-CM | POA: Diagnosis not present

## 2023-06-08 DIAGNOSIS — E782 Mixed hyperlipidemia: Secondary | ICD-10-CM | POA: Diagnosis not present

## 2023-06-08 DIAGNOSIS — Z8572 Personal history of non-Hodgkin lymphomas: Secondary | ICD-10-CM | POA: Insufficient documentation

## 2023-06-08 DIAGNOSIS — S79912A Unspecified injury of left hip, initial encounter: Secondary | ICD-10-CM | POA: Diagnosis not present

## 2023-06-08 DIAGNOSIS — X509XXA Other and unspecified overexertion or strenuous movements or postures, initial encounter: Secondary | ICD-10-CM | POA: Diagnosis not present

## 2023-06-08 DIAGNOSIS — Z96643 Presence of artificial hip joint, bilateral: Secondary | ICD-10-CM | POA: Insufficient documentation

## 2023-06-08 DIAGNOSIS — Z79899 Other long term (current) drug therapy: Secondary | ICD-10-CM | POA: Diagnosis not present

## 2023-06-08 DIAGNOSIS — J449 Chronic obstructive pulmonary disease, unspecified: Secondary | ICD-10-CM | POA: Diagnosis not present

## 2023-06-08 DIAGNOSIS — Z96641 Presence of right artificial hip joint: Secondary | ICD-10-CM | POA: Diagnosis not present

## 2023-06-08 MED ORDER — MORPHINE SULFATE (PF) 4 MG/ML IV SOLN
4.0000 mg | Freq: Once | INTRAVENOUS | Status: AC
  Administered 2023-06-08: 4 mg via INTRAVENOUS
  Filled 2023-06-08: qty 1

## 2023-06-08 MED ORDER — SODIUM CHLORIDE 0.9 % IV BOLUS
1000.0000 mL | Freq: Once | INTRAVENOUS | Status: AC
  Administered 2023-06-08: 1000 mL via INTRAVENOUS

## 2023-06-08 MED ORDER — PROPOFOL 10 MG/ML IV BOLUS
0.5000 mg/kg | Freq: Once | INTRAVENOUS | Status: AC
  Administered 2023-06-08: 51.1 mg via INTRAVENOUS
  Filled 2023-06-08: qty 20

## 2023-06-08 MED ORDER — HYDROMORPHONE HCL 1 MG/ML IJ SOLN
0.5000 mg | Freq: Once | INTRAMUSCULAR | Status: AC
  Administered 2023-06-08: 0.5 mg via INTRAVENOUS
  Filled 2023-06-08: qty 0.5

## 2023-06-08 MED ORDER — KETAMINE HCL 50 MG/5ML IJ SOSY
0.5000 mg/kg | PREFILLED_SYRINGE | Freq: Once | INTRAMUSCULAR | Status: AC
  Administered 2023-06-08: 51 mg via INTRAVENOUS
  Filled 2023-06-08: qty 10

## 2023-06-08 MED ORDER — HYDROCODONE-ACETAMINOPHEN 5-325 MG PO TABS
1.0000 | ORAL_TABLET | Freq: Once | ORAL | Status: AC
  Administered 2023-06-08: 1 via ORAL
  Filled 2023-06-08: qty 1

## 2023-06-08 NOTE — ED Notes (Signed)
 Patient and family made aware that he is to remain NPO until seen by Ortho; expresses verbal understanding w/out any further questions.

## 2023-06-08 NOTE — Progress Notes (Signed)
 Plan of Care Note for accepted transfer    Patient: Gavin Maxwell MRN: [id number] DOB: [birth year] DOA: 06/08/2023   Facility requesting transfer: [facility] ED Requesting Provider: Cruz Condon, PA Reason for transfer: hip dislocation Facility course: Patient is an 78 year old male with HTN, CKD 3B, COPD, non-Hodgkin's lymphoma in remission, PAF on amiodarone anticoagulation who presented to the emergency room with left hip pain.  He was apparently in the gym on a stationary bike, was trying to reach something on the floor and bended down and heard a pop in his left hip, followed by significant pain.  Workup in the ER showed a posterior superior dislocation of the femoral component of the left hip prosthesis without acute fractures.  Reduction was attempted in the emergency room and it was unsuccessful.  Patient is established with Dr. Dion Saucier who did his right hip replacement, ED team discussed with Dr. Dion Saucier and patient will be transferred from [facility] ER to [facility] hospital for orthopedic surgery evaluation and potential reduction in the OR.  Admitting team, please contact Dr. Dion Saucier upon arrival to [facility] for a formal consult.   Plan of care: The patient is accepted for admission to telemetry unit, at [facility].  Author: Pamella Pert, MD, PhD [facility]   Check [email or web address] for on-call coverage.   Nursing staff, Please call [facility] Admits & Consults System-Wide number on Amion as soon as patient's arrival, so appropriate admitting provider can evaluate the patient

## 2023-06-08 NOTE — ED Notes (Signed)
 Pt medicated for pain. Per ortho team, pt okay to eat now, NPO at midnight. Pt given sandwich tray per request. Call bell and urinal within reach.

## 2023-06-08 NOTE — ED Triage Notes (Signed)
 Pt comes via EMs from [employer]. Pt was sitting on stationary bike and went to adjust seat and then felt left hip pop. Pt unable to straighten out.   Pt did have hip replacement in the past.  Pt is on eliquis for Afib

## 2023-06-08 NOTE — ED Provider Notes (Signed)
 [facility] Provider Note    Event Date/Time   First MD Initiated Contact with Patient 06/08/23 1305     (approximate)   History   Hip Pain   HPI  ROSHON Maxwell is a 78 y.o. male with PMH of hypertension, CKD, COPD, non-Hodgkin's lymphoma in remission, and bilateral total hip arthroplasties presents for evaluation of left hip pain.  Patient was sitting on a stationary bike and went to bend down to pick something up off the floor and felt his left hip pop.  Since then he has been unable to straighten his leg and any movement of the left leg causes severe pain.      Physical Exam   Triage Vital Signs: ED Triage Vitals  Encounter Vitals Group     BP 06/08/23 1228 116/65     Systolic BP Percentile --      Diastolic BP Percentile --      Pulse Rate 06/08/23 1228 70     Resp 06/08/23 1228 20     Temp 06/08/23 1228 98.7 F (37.1 C)     Temp Source 06/08/23 1228 Oral     SpO2 06/08/23 1228 97 %     Weight 06/08/23 1223 220 lb (99.8 kg)     Height 06/08/23 1223 5\' 11"  (1.803 m)     Head Circumference --      Peak Flow --      Pain Score 06/08/23 1229 8     Pain Loc --      Pain Education --      Exclude from Growth Chart --     Most recent vital signs: Vitals:   06/08/23 1228 06/08/23 1509  BP: 116/65 (!) 144/78  Pulse: 70 61  Resp: 20 15  Temp: 98.7 F (37.1 C)   SpO2: 97% 100%   General: Awake, no distress.  CV:  Good peripheral perfusion.  Resp:  Normal effort.  Abd:  No distention.  Other:  Patient laying in bed with left leg internally rotated and abducted, unable to perform any ROM, dorsalis pedis pulse and posterior tibialis pulses 2+ and regular.  Sensation of distal extremity intact.   ED Results / Procedures / Treatments   Labs (all labs ordered are listed, but only abnormal results are displayed) Labs Reviewed - No data to display   RADIOLOGY  Left hip x-ray obtained, interpreted the images as well as reviewed the  radiologist report.  Images show hardware intact from bilateral total hip arthroplasty with a posterior hip dislocation on the left.   PROCEDURES:  Critical Care performed: No  Procedures   MEDICATIONS ORDERED IN ED: Medications  morphine (PF) 4 MG/ML injection 4 mg (has no administration in time range)  HYDROcodone-acetaminophen (NORCO/VICODIN) 5-325 MG per tablet 1 tablet (1 tablet Oral Given 06/08/23 1401)  ketamine 50 mg in normal saline 5 mL (10 mg/mL) syringe (51 mg Intravenous Given 06/08/23 1504)  propofol (DIPRIVAN) 10 mg/mL bolus/IV push 51.1 mg (51.1 mg Intravenous Given 06/08/23 1505)  sodium chloride 0.9 % bolus 1,000 mL (1,000 mLs Intravenous New Bag/Given 06/08/23 1501)     IMPRESSION / MDM / ASSESSMENT AND PLAN / ED COURSE  I reviewed the triage vital signs and the nursing notes.                             78 year old male for evaluation of left hip pain.  Vital signs are stable  patient uncomfortable on exam.  Differential diagnosis includes, but is not limited to, hip dislocation, fracture, muscle strain, bursitis.  Patient's presentation is most consistent with acute presentation with potential threat to life or bodily function.  Hip x-ray shows bilateral total hip arthroplasty with posterior hip dislocation on the left.  Spoke with on call orthopedics provider Dr. Joice Lofts who recommended we try to reduce in the ED and then if unsuccessful he can reduce it in the OR.  With the help of my attending physician, Dr. Vicente Males, attempted to reduce the hip under sedation and we were unsuccessful.  Patient's wife has spoken with Dr. Dion Saucier who did the patient's right hip replacement.  Their preference is for him to be transferred into Dr. Shelba Flake care.   Spoke with Dr. Dion Saucier who is willing to reduce the hip in the OR tomorrow.  He will need to be transferred to the medical service at [facility].  Spoke with the admitting hospitalist at [facility] who is willing to  accept.  Care of the patient will be passed off to the oncoming provider who can watch patient until the time of transfer.      FINAL CLINICAL IMPRESSION(S) / ED DIAGNOSES   Final diagnoses:  Posterior dislocation of hip, closed, left, initial encounter [facility]     Rx / DC Orders   ED Discharge Orders     None        Note:  This document was prepared using Dragon voice recognition software and may include unintentional dictation errors.   Cameron Ali, PA-C 06/08/23 1614    Merwyn Katos, MD 06/09/23 1500

## 2023-06-08 NOTE — Progress Notes (Signed)
 Patient well known to me with left prosthetic hip dislocation, unable to be reduced by ER at [facility], plan for admission to [facility] for closed reduction tomorrow, may need further intervention.  Appreciate medical support, as he has a lot of complicating comorbid conditions. Full consult to follow.   Eulas Post, MD .

## 2023-06-09 ENCOUNTER — Other Ambulatory Visit: Payer: Self-pay

## 2023-06-09 ENCOUNTER — Inpatient Hospital Stay (HOSPITAL_COMMUNITY)

## 2023-06-09 ENCOUNTER — Encounter (HOSPITAL_COMMUNITY): Payer: Self-pay | Admitting: Orthopedic Surgery

## 2023-06-09 ENCOUNTER — Encounter (HOSPITAL_COMMUNITY)
Admission: EM | Disposition: A | Discharge status: Home Health | Payer: Self-pay | Source: Ambulatory Visit | Attending: Internal Medicine

## 2023-06-09 ENCOUNTER — Observation Stay (HOSPITAL_COMMUNITY)

## 2023-06-09 ENCOUNTER — Observation Stay (HOSPITAL_COMMUNITY)
Admission: EM | Admit: 2023-06-09 | Discharge: 2023-06-12 | Disposition: A | Discharge status: Home Health | Source: Ambulatory Visit | Attending: Internal Medicine | Admitting: Internal Medicine

## 2023-06-09 ENCOUNTER — Inpatient Hospital Stay (HOSPITAL_COMMUNITY): Admitting: Certified Registered"

## 2023-06-09 ENCOUNTER — Inpatient Hospital Stay (HOSPITAL_BASED_OUTPATIENT_CLINIC_OR_DEPARTMENT_OTHER): Admitting: Certified Registered"

## 2023-06-09 DIAGNOSIS — X501XXA Overexertion from prolonged static or awkward postures, initial encounter: Secondary | ICD-10-CM | POA: Insufficient documentation

## 2023-06-09 DIAGNOSIS — J449 Chronic obstructive pulmonary disease, unspecified: Secondary | ICD-10-CM

## 2023-06-09 DIAGNOSIS — R059 Cough, unspecified: Secondary | ICD-10-CM | POA: Insufficient documentation

## 2023-06-09 DIAGNOSIS — R946 Abnormal results of thyroid function studies: Secondary | ICD-10-CM | POA: Insufficient documentation

## 2023-06-09 DIAGNOSIS — N183 Chronic kidney disease, stage 3 unspecified: Secondary | ICD-10-CM | POA: Diagnosis not present

## 2023-06-09 DIAGNOSIS — R0602 Shortness of breath: Secondary | ICD-10-CM | POA: Diagnosis not present

## 2023-06-09 DIAGNOSIS — C8590 Non-Hodgkin lymphoma, unspecified, unspecified site: Secondary | ICD-10-CM | POA: Diagnosis not present

## 2023-06-09 DIAGNOSIS — D649 Anemia, unspecified: Secondary | ICD-10-CM | POA: Insufficient documentation

## 2023-06-09 DIAGNOSIS — N1832 Chronic kidney disease, stage 3b: Secondary | ICD-10-CM | POA: Diagnosis not present

## 2023-06-09 DIAGNOSIS — S73015A Posterior dislocation of left hip, initial encounter: Secondary | ICD-10-CM | POA: Diagnosis not present

## 2023-06-09 DIAGNOSIS — I130 Hypertensive heart and chronic kidney disease with heart failure and stage 1 through stage 4 chronic kidney disease, or unspecified chronic kidney disease: Secondary | ICD-10-CM | POA: Diagnosis not present

## 2023-06-09 DIAGNOSIS — Z7984 Long term (current) use of oral hypoglycemic drugs: Secondary | ICD-10-CM | POA: Insufficient documentation

## 2023-06-09 DIAGNOSIS — K5900 Constipation, unspecified: Secondary | ICD-10-CM | POA: Diagnosis not present

## 2023-06-09 DIAGNOSIS — I480 Paroxysmal atrial fibrillation: Secondary | ICD-10-CM | POA: Diagnosis not present

## 2023-06-09 DIAGNOSIS — Z96643 Presence of artificial hip joint, bilateral: Secondary | ICD-10-CM | POA: Diagnosis not present

## 2023-06-09 DIAGNOSIS — I129 Hypertensive chronic kidney disease with stage 1 through stage 4 chronic kidney disease, or unspecified chronic kidney disease: Secondary | ICD-10-CM | POA: Diagnosis not present

## 2023-06-09 DIAGNOSIS — S73005A Unspecified dislocation of left hip, initial encounter: Secondary | ICD-10-CM | POA: Diagnosis present

## 2023-06-09 DIAGNOSIS — Z79899 Other long term (current) drug therapy: Secondary | ICD-10-CM | POA: Diagnosis not present

## 2023-06-09 DIAGNOSIS — Z87891 Personal history of nicotine dependence: Secondary | ICD-10-CM | POA: Insufficient documentation

## 2023-06-09 DIAGNOSIS — F418 Other specified anxiety disorders: Secondary | ICD-10-CM | POA: Diagnosis not present

## 2023-06-09 DIAGNOSIS — I10 Essential (primary) hypertension: Secondary | ICD-10-CM | POA: Diagnosis present

## 2023-06-09 DIAGNOSIS — T84021A Dislocation of internal left hip prosthesis, initial encounter: Secondary | ICD-10-CM | POA: Diagnosis not present

## 2023-06-09 DIAGNOSIS — K219 Gastro-esophageal reflux disease without esophagitis: Secondary | ICD-10-CM | POA: Diagnosis not present

## 2023-06-09 DIAGNOSIS — E785 Hyperlipidemia, unspecified: Secondary | ICD-10-CM | POA: Insufficient documentation

## 2023-06-09 DIAGNOSIS — Z7901 Long term (current) use of anticoagulants: Secondary | ICD-10-CM | POA: Diagnosis not present

## 2023-06-09 DIAGNOSIS — Z96651 Presence of right artificial knee joint: Secondary | ICD-10-CM | POA: Diagnosis not present

## 2023-06-09 DIAGNOSIS — I5031 Acute diastolic (congestive) heart failure: Secondary | ICD-10-CM | POA: Diagnosis not present

## 2023-06-09 DIAGNOSIS — R7989 Other specified abnormal findings of blood chemistry: Secondary | ICD-10-CM

## 2023-06-09 DIAGNOSIS — I700 Atherosclerosis of aorta: Secondary | ICD-10-CM | POA: Diagnosis not present

## 2023-06-09 DIAGNOSIS — Z96612 Presence of left artificial shoulder joint: Secondary | ICD-10-CM | POA: Diagnosis not present

## 2023-06-09 DIAGNOSIS — Z96611 Presence of right artificial shoulder joint: Secondary | ICD-10-CM | POA: Insufficient documentation

## 2023-06-09 HISTORY — PX: HIP CLOSED REDUCTION: SHX983

## 2023-06-09 LAB — T4, FREE: Free T4: 1.3 ng/dL — ABNORMAL HIGH (ref 0.61–1.12)

## 2023-06-09 LAB — BRAIN NATRIURETIC PEPTIDE: B Natriuretic Peptide: 161.3 pg/mL — ABNORMAL HIGH (ref 0.0–100.0)

## 2023-06-09 SURGERY — CLOSED REDUCTION, HIP
Anesthesia: General | Site: Hip | Laterality: Left

## 2023-06-09 MED ORDER — DEXAMETHASONE SODIUM PHOSPHATE 10 MG/ML IJ SOLN
INTRAMUSCULAR | Status: AC
  Filled 2023-06-09: qty 1

## 2023-06-09 MED ORDER — TRAMADOL HCL 50 MG PO TABS
50.0000 mg | ORAL_TABLET | Freq: Four times a day (QID) | ORAL | Status: DC | PRN
  Administered 2023-06-10 – 2023-06-11 (×5): 50 mg via ORAL
  Filled 2023-06-09 (×5): qty 1

## 2023-06-09 MED ORDER — BRIMONIDINE TARTRATE 0.2 % OP SOLN
1.0000 [drp] | Freq: Two times a day (BID) | OPHTHALMIC | Status: DC
  Administered 2023-06-09 – 2023-06-12 (×6): 1 [drp] via OPHTHALMIC
  Filled 2023-06-09: qty 5

## 2023-06-09 MED ORDER — DAPAGLIFLOZIN PROPANEDIOL 10 MG PO TABS
10.0000 mg | ORAL_TABLET | Freq: Every day | ORAL | Status: DC
  Administered 2023-06-10 – 2023-06-12 (×3): 10 mg via ORAL
  Filled 2023-06-09 (×3): qty 1

## 2023-06-09 MED ORDER — HYDROMORPHONE HCL 1 MG/ML IJ SOLN
0.5000 mg | Freq: Once | INTRAMUSCULAR | Status: AC
  Administered 2023-06-09: 0.5 mg via INTRAVENOUS
  Filled 2023-06-09: qty 0.5

## 2023-06-09 MED ORDER — CALCIUM POLYCARBOPHIL 625 MG PO TABS
625.0000 mg | ORAL_TABLET | Freq: Two times a day (BID) | ORAL | Status: DC
  Administered 2023-06-09 – 2023-06-12 (×6): 625 mg via ORAL
  Filled 2023-06-09 (×6): qty 1

## 2023-06-09 MED ORDER — DIPHENHYDRAMINE HCL 12.5 MG/5ML PO ELIX: 12.5000 mg | ORAL_SOLUTION | ORAL | Status: DC | PRN

## 2023-06-09 MED ORDER — SERTRALINE HCL 50 MG PO TABS
50.0000 mg | ORAL_TABLET | Freq: Every day | ORAL | Status: DC
  Administered 2023-06-09 – 2023-06-11 (×3): 50 mg via ORAL
  Filled 2023-06-09 (×3): qty 1

## 2023-06-09 MED ORDER — FLUTICASONE PROPIONATE 50 MCG/ACT NA SUSP
2.0000 | Freq: Every day | NASAL | Status: DC
Start: 2023-06-10 — End: 2023-06-12
  Administered 2023-06-12: 2 via NASAL
  Filled 2023-06-09 (×2): qty 16

## 2023-06-09 MED ORDER — ORAL CARE MOUTH RINSE: 15.0000 mL | Freq: Once | OROMUCOSAL | Status: AC

## 2023-06-09 MED ORDER — DEXAMETHASONE SODIUM PHOSPHATE 10 MG/ML IJ SOLN
INTRAMUSCULAR | Status: DC | PRN
  Administered 2023-06-09: 4 mg via INTRAVENOUS

## 2023-06-09 MED ORDER — TIMOLOL MALEATE 0.5 % OP SOLN
1.0000 [drp] | Freq: Two times a day (BID) | OPHTHALMIC | Status: DC
  Administered 2023-06-09 – 2023-06-12 (×6): 1 [drp] via OPHTHALMIC
  Filled 2023-06-09: qty 5

## 2023-06-09 MED ORDER — LATANOPROST 0.005 % OP SOLN
1.0000 [drp] | Freq: Every day | OPHTHALMIC | Status: DC
  Administered 2023-06-09 – 2023-06-11 (×3): 1 [drp] via OPHTHALMIC
  Filled 2023-06-09: qty 2.5

## 2023-06-09 MED ORDER — LACTATED RINGERS IV SOLN: INTRAVENOUS | Status: DC

## 2023-06-09 MED ORDER — IMIPRAMINE HCL 25 MG PO TABS
25.0000 mg | ORAL_TABLET | Freq: Every day | ORAL | Status: DC
  Administered 2023-06-09 – 2023-06-11 (×3): 25 mg via ORAL
  Filled 2023-06-09 (×3): qty 1

## 2023-06-09 MED ORDER — DOCUSATE SODIUM 100 MG PO CAPS
100.0000 mg | ORAL_CAPSULE | Freq: Two times a day (BID) | ORAL | Status: DC
  Administered 2023-06-09 – 2023-06-10 (×2): 100 mg via ORAL
  Filled 2023-06-09 (×2): qty 1

## 2023-06-09 MED ORDER — METHOCARBAMOL 500 MG PO TABS
500.0000 mg | ORAL_TABLET | Freq: Four times a day (QID) | ORAL | Status: DC | PRN
  Administered 2023-06-10 – 2023-06-11 (×3): 500 mg via ORAL
  Filled 2023-06-09 (×3): qty 1

## 2023-06-09 MED ORDER — METOCLOPRAMIDE HCL 5 MG/ML IJ SOLN: 5.0000 mg | Freq: Three times a day (TID) | INTRAMUSCULAR | Status: DC | PRN

## 2023-06-09 MED ORDER — ONDANSETRON HCL 4 MG/2ML IJ SOLN: 4.0000 mg | Freq: Four times a day (QID) | INTRAMUSCULAR | Status: DC | PRN

## 2023-06-09 MED ORDER — METOCLOPRAMIDE HCL 5 MG PO TABS: 5.0000 mg | ORAL_TABLET | Freq: Three times a day (TID) | ORAL | Status: DC | PRN

## 2023-06-09 MED ORDER — METHOCARBAMOL 1000 MG/10ML IJ SOLN: 500.0000 mg | Freq: Four times a day (QID) | INTRAMUSCULAR | Status: DC | PRN

## 2023-06-09 MED ORDER — ATORVASTATIN CALCIUM 40 MG PO TABS
80.0000 mg | ORAL_TABLET | Freq: Every day | ORAL | Status: DC
  Administered 2023-06-10 – 2023-06-12 (×3): 80 mg via ORAL
  Filled 2023-06-09 (×3): qty 2

## 2023-06-09 MED ORDER — SUCCINYLCHOLINE CHLORIDE 200 MG/10ML IV SOSY
PREFILLED_SYRINGE | INTRAVENOUS | Status: DC | PRN
  Administered 2023-06-09: 100 mg via INTRAVENOUS

## 2023-06-09 MED ORDER — METOPROLOL TARTRATE 5 MG/5ML IV SOLN: 5.0000 mg | Freq: Three times a day (TID) | INTRAVENOUS | Status: DC | PRN

## 2023-06-09 MED ORDER — METOPROLOL SUCCINATE ER 25 MG PO TB24
25.0000 mg | ORAL_TABLET | Freq: Every evening | ORAL | Status: DC
  Administered 2023-06-09 – 2023-06-11 (×3): 25 mg via ORAL
  Filled 2023-06-09 (×4): qty 1

## 2023-06-09 MED ORDER — LEVOTHYROXINE SODIUM 25 MCG PO TABS
25.0000 ug | ORAL_TABLET | Freq: Every day | ORAL | Status: DC
  Administered 2023-06-10: 25 ug via ORAL
  Filled 2023-06-09: qty 1

## 2023-06-09 MED ORDER — PROPOFOL 10 MG/ML IV BOLUS
INTRAVENOUS | Status: DC | PRN
  Administered 2023-06-09: 160 mg via INTRAVENOUS

## 2023-06-09 MED ORDER — PANTOPRAZOLE SODIUM 40 MG PO TBEC
40.0000 mg | DELAYED_RELEASE_TABLET | Freq: Every day | ORAL | Status: DC
  Administered 2023-06-09 – 2023-06-12 (×4): 40 mg via ORAL
  Filled 2023-06-09 (×4): qty 1

## 2023-06-09 MED ORDER — GABAPENTIN 300 MG PO CAPS
300.0000 mg | ORAL_CAPSULE | Freq: Three times a day (TID) | ORAL | Status: DC
  Administered 2023-06-09 – 2023-06-12 (×8): 300 mg via ORAL
  Filled 2023-06-09 (×8): qty 1

## 2023-06-09 MED ORDER — HYDROMORPHONE HCL 1 MG/ML IJ SOLN
0.5000 mg | INTRAMUSCULAR | Status: DC | PRN
  Administered 2023-06-09 (×3): 0.5 mg via INTRAVENOUS
  Filled 2023-06-09 (×3): qty 0.5

## 2023-06-09 MED ORDER — ACETAMINOPHEN 325 MG PO TABS: 325.0000 mg | ORAL_TABLET | Freq: Four times a day (QID) | ORAL | Status: DC | PRN

## 2023-06-09 MED ORDER — FERROUS SULFATE 325 (65 FE) MG PO TABS
325.0000 mg | ORAL_TABLET | Freq: Every day | ORAL | Status: DC
Start: 2023-06-10 — End: 2023-06-12
  Administered 2023-06-10 – 2023-06-12 (×3): 325 mg via ORAL
  Filled 2023-06-09 (×3): qty 1

## 2023-06-09 MED ORDER — MAGNESIUM OXIDE -MG SUPPLEMENT 400 (240 MG) MG PO TABS
400.0000 mg | ORAL_TABLET | Freq: Three times a day (TID) | ORAL | Status: DC
  Administered 2023-06-09 – 2023-06-12 (×8): 400 mg via ORAL
  Filled 2023-06-09 (×8): qty 1

## 2023-06-09 MED ORDER — BRIMONIDINE TARTRATE-TIMOLOL 0.2-0.5 % OP SOLN: 1.0000 [drp] | Freq: Two times a day (BID) | OPHTHALMIC | Status: DC

## 2023-06-09 MED ORDER — ONDANSETRON HCL 4 MG PO TABS: 4.0000 mg | ORAL_TABLET | Freq: Four times a day (QID) | ORAL | Status: DC | PRN

## 2023-06-09 MED ORDER — ACETAMINOPHEN 650 MG RE SUPP: 650.0000 mg | Freq: Four times a day (QID) | RECTAL | Status: DC | PRN

## 2023-06-09 MED ORDER — CHLORHEXIDINE GLUCONATE 0.12 % MT SOLN
15.0000 mL | Freq: Once | OROMUCOSAL | Status: AC
  Administered 2023-06-09: 15 mL via OROMUCOSAL

## 2023-06-09 MED ORDER — ALBUTEROL SULFATE (2.5 MG/3ML) 0.083% IN NEBU: 3.0000 mL | INHALATION_SOLUTION | Freq: Four times a day (QID) | RESPIRATORY_TRACT | Status: DC | PRN

## 2023-06-09 MED ORDER — CHLORHEXIDINE GLUCONATE 4 % EX SOLN: 1.0000 | CUTANEOUS | 1 refills | Status: DC

## 2023-06-09 MED ORDER — LIDOCAINE HCL (PF) 2 % IJ SOLN
INTRAMUSCULAR | Status: DC | PRN
  Administered 2023-06-09: 40 mg via INTRADERMAL

## 2023-06-09 MED ORDER — FENOFIBRATE 160 MG PO TABS
160.0000 mg | ORAL_TABLET | Freq: Every day | ORAL | Status: DC
  Administered 2023-06-10 – 2023-06-12 (×3): 160 mg via ORAL
  Filled 2023-06-09 (×3): qty 1

## 2023-06-09 MED ORDER — LIDOCAINE HCL (PF) 2 % IJ SOLN
INTRAMUSCULAR | Status: AC
  Filled 2023-06-09: qty 5

## 2023-06-09 MED ORDER — ACETAMINOPHEN 325 MG PO TABS
650.0000 mg | ORAL_TABLET | Freq: Four times a day (QID) | ORAL | Status: DC | PRN
  Administered 2023-06-09 – 2023-06-10 (×2): 650 mg via ORAL
  Filled 2023-06-09 (×2): qty 2

## 2023-06-09 MED ORDER — ACETAMINOPHEN 500 MG PO TABS
1000.0000 mg | ORAL_TABLET | Freq: Once | ORAL | Status: AC
  Administered 2023-06-09: 1000 mg via ORAL
  Filled 2023-06-09: qty 2

## 2023-06-09 MED ORDER — PROPOFOL 10 MG/ML IV BOLUS
INTRAVENOUS | Status: AC
  Filled 2023-06-09: qty 20

## 2023-06-09 MED ORDER — ONDANSETRON HCL 4 MG/2ML IJ SOLN
INTRAMUSCULAR | Status: DC | PRN
  Administered 2023-06-09: 4 mg via INTRAVENOUS

## 2023-06-09 MED ORDER — AMIODARONE HCL 200 MG PO TABS
200.0000 mg | ORAL_TABLET | Freq: Every day | ORAL | Status: DC
  Administered 2023-06-09 – 2023-06-12 (×4): 200 mg via ORAL
  Filled 2023-06-09 (×4): qty 1

## 2023-06-09 MED ORDER — MUPIROCIN 2 % EX OINT: 1.0000 | TOPICAL_OINTMENT | Freq: Two times a day (BID) | CUTANEOUS | 0 refills | Status: DC

## 2023-06-09 MED ORDER — APIXABAN 2.5 MG PO TABS
2.5000 mg | ORAL_TABLET | Freq: Two times a day (BID) | ORAL | Status: DC
Start: 2023-06-10 — End: 2023-06-12
  Administered 2023-06-10 – 2023-06-12 (×5): 2.5 mg via ORAL
  Filled 2023-06-09 (×5): qty 1

## 2023-06-09 MED ORDER — ONDANSETRON HCL 4 MG/2ML IJ SOLN
INTRAMUSCULAR | Status: AC
  Filled 2023-06-09: qty 2

## 2023-06-09 NOTE — Assessment & Plan Note (Signed)
 Has had chronic cough with no fever CXR with no acute process, no leukocytosis Likely has bronchitis, already had 2 rounds of antibiotics Discussed no indication for antibiotics at this point Conservative treatment

## 2023-06-09 NOTE — Op Note (Signed)
 06/09/2023  2:36 PM  PATIENT:  Gavin Maxwell    PRE-OPERATIVE DIAGNOSIS:  LEFT HIP DISLOCATION  POST-OPERATIVE DIAGNOSIS:  Same  PROCEDURE:  CLOSED REDUCTION HIP, LEFT  SURGEON:  Sheral Apley, MD  ASSISTANT: Levester Fresh, PA-C, he was present and scrubbed throughout the case, critical for completion in a timely fashion, and for retraction, instrumentation, and closure.   ANESTHESIA:   gen  PREOPERATIVE INDICATIONS:  [name] is a  78 y.o. male with a diagnosis of LEFT HIP DISLOCATION who failed conservative measures and elected for surgical management.    The risks benefits and alternatives were discussed with the patient preoperatively including but not limited to the risks of infection, bleeding, nerve injury, cardiopulmonary complications, the need for revision surgery, among others, and the patient was willing to proceed.  COMPLICATIONS: none  TOURNIQUET TIME: none  OPERATIVE PROCEDURE:  Patient was identified in the preoperative holding area and site was marked by me He was transported to the operating theater and placed on the table in supine position taking care to pad all bony prominences. After a preincinduction time out anesthesia was induced.   He was positioned laterally padding all bony prominences ax roll was placed  Traction was applied with hip flexion and hip reduction was performed on the left side I took a plain film and confirmed concentric reduction of his left hip.  He was placed in a knee knee immobilizer.  POST OPERATIVE PLAN: Weightbearing as tolerated hip abduction orthosis. knee immobilizer for bed

## 2023-06-09 NOTE — Assessment & Plan Note (Signed)
 Continue high intensity statin daily

## 2023-06-09 NOTE — Anesthesia Procedure Notes (Addendum)
 Procedure Name: MAC Date/Time: 06/09/2023 2:11 PM  Performed by: Sindy Guadeloupe, CRNAPre-anesthesia Checklist: Patient identified, Emergency Drugs available, Suction available, Patient being monitored and Timeout performed Patient Re-evaluated:Patient Re-evaluated prior to induction Oxygen Delivery Method: Circle system utilized Preoxygenation: Pre-oxygenation with 100% oxygen Induction Type: IV induction Ventilation: Mask ventilation without difficulty and Oral airway inserted - appropriate to patient size Comments: Mask case

## 2023-06-09 NOTE — ED Notes (Signed)
 Report to Shanda Bumps, Charity fundraiser at [employer]

## 2023-06-09 NOTE — Assessment & Plan Note (Signed)
 In remission.

## 2023-06-09 NOTE — Anesthesia Preprocedure Evaluation (Addendum)
 Anesthesia Evaluation  Patient identified by MRN, date of birth, ID band Patient awake    Reviewed: Allergy & Precautions, NPO status , Patient's Chart, lab work & pertinent test results, reviewed documented beta blocker date and time   Airway Mallampati: I  TM Distance: >3 FB Neck ROM: Full    Dental  (+) Missing, Poor Dentition, Chipped,    Pulmonary sleep apnea , COPD, former smoker   breath sounds clear to auscultation       Cardiovascular hypertension, Pt. on home beta blockers + Past MI and +CHF  + dysrhythmias  Rhythm:Regular Rate:Normal     Neuro/Psych  PSYCHIATRIC DISORDERS Anxiety Depression     Neuromuscular disease    GI/Hepatic Neg liver ROS, hiatal hernia,GERD  ,,  Endo/Other    Renal/GU Renal disease     Musculoskeletal   Abdominal   Peds  Hematology   Anesthesia Other Findings   Reproductive/Obstetrics                             Anesthesia Physical Anesthesia Plan  ASA: 3  Anesthesia Plan: General   Post-op Pain Management:    Induction: Intravenous  PONV Risk Score and Plan: 3 and Ondansetron and Treatment may vary due to age or medical condition  Airway Management Planned: Mask  Additional Equipment: None  Intra-op Plan:   Post-operative Plan: Extubation in OR  Informed Consent: I have reviewed the patients History and Physical, chart, labs and discussed the procedure including the risks, benefits and alternatives for the proposed anesthesia with the patient or authorized representative who has indicated his/her understanding and acceptance.       Plan Discussed with: CRNA  Anesthesia Plan Comments: (Possible OETT.)       Anesthesia Quick Evaluation

## 2023-06-09 NOTE — Assessment & Plan Note (Signed)
Continue zoloft daily  

## 2023-06-09 NOTE — Anesthesia Procedure Notes (Addendum)
 Procedure Name: Intubation Date/Time: 06/09/2023 2:18 PM  Performed by: Sindy Guadeloupe, CRNAPre-anesthesia Checklist: Patient identified, Emergency Drugs available, Suction available, Patient being monitored and Timeout performed Patient Re-evaluated:Patient Re-evaluated prior to induction Oxygen Delivery Method: Circle system utilized Preoxygenation: Pre-oxygenation with 100% oxygen Induction Type: IV induction Ventilation: Mask ventilation without difficulty Laryngoscope Size: Mac and 4 Grade View: Grade I Tube type: Oral Tube size: 7.5 mm Number of attempts: 1 Airway Equipment and Method: Stylet Placement Confirmation: ETT inserted through vocal cords under direct vision, positive ETCO2 and breath sounds checked- equal and bilateral Secured at: 23 cm Tube secured with: Tape Dental Injury: Teeth and Oropharynx as per pre-operative assessment

## 2023-06-09 NOTE — Transfer of Care (Signed)
 Immediate Anesthesia Transfer of Care Note  Patient: Gavin Maxwell  Procedure(s) Performed: CLOSED REDUCTION HIP, LEFT (Left: Hip)  Patient Location: PACU  Anesthesia Type:General  Level of Consciousness: sedated  Airway & Oxygen Therapy: Patient Spontanous Breathing and Patient connected to face mask oxygen  Post-op Assessment: Report given to RN and Post -op Vital signs reviewed and stable  Post vital signs: Reviewed and stable  Last Vitals:  Vitals Value Taken Time  BP 153/79 06/09/23 1445  Temp    Pulse 87 06/09/23 1446  Resp 10 06/09/23 1446  SpO2 100 % 06/09/23 1446  Vitals shown include unfiled device data.  Last Pain:  Vitals:   06/09/23 1147  TempSrc: Oral         Complications: No notable events documented.

## 2023-06-09 NOTE — Progress Notes (Signed)
 Orthopedic Tech Progress Note Patient Details:  Gavin Maxwell 09-15-1945 [id number]  Patient ID: [name], male   DOB: [birth year], 78 y.o.   MRN: [id number]  [name] 06/09/2023, 4:08 PM Hip abduction brace ordered from [facility] clinic

## 2023-06-09 NOTE — Assessment & Plan Note (Signed)
 Continue home toprol-xl PRN IV metoprolol

## 2023-06-09 NOTE — Assessment & Plan Note (Signed)
 Baseline creatinine 2.0 Stable, continue to monitor

## 2023-06-09 NOTE — H&P (Signed)
 History and Physical    Patient: Gavin Maxwell:[id number] DOB: October 24, 1945 DOA: 06/09/2023 DOS: the patient was seen and examined on 06/09/2023 PCP: Marguarite Arbour, MD  Patient coming from:  [facility]   - lives with his wife. He uses a walker and a cane to get around.    Chief Complaint: left hip pain   HPI: [name] is a 78 y.o. male with medical history significant of  HTN, CKD 3B, COPD, non-Hodgkin's lymphoma in remission, PAF on amiodarone and eliquis who presented to the emergency room with left hip pain. He was apparently in the gym on a stationary bike, was trying to reach something on the floor and bended down and heard a pop in his left hip, followed by significant pain. He felt it separate it and he couldn't move it.   Workup in the ER showed a posterior superior dislocation of the femoral component of the left hip prosthesis without acute fractures. Reduction was attempted in the emergency room and it was unsuccessful. Patient is established with Dr. Dion Saucier who did his right hip replacement, ED team discussed with Dr. Dion Saucier and patient will be transferred from [facility] ER to [facility] for orthopedic surgery evaluation and potential reduction in the OR.    He has been feeling good. Denies any fever/chills, vision changes/headaches, chest pain or palpitations, abdominal pain, N/V/D, dysuria or leg swelling. Has had a cough and given 2 rounds of antibiotics by his PCP. He has been short of breath. He had a third round of antibiotics called in yesterday, doxycycline, that he didn't pick up.    He does not smoke or drink alcohol. Last took his eliquis yesterday AM.   ER Course:  vitals: afebrile, bp: 140/89, HR: 95, RR: 17, oxygen: 100%RA Pertinent labs: hgb: 11.4, TSH: 9.102, creatinine: 1.9,  Pelvic xray: Posterosuperior dislocation of the femoral component of the left hip prosthesis. No acute fracture identified. In ED: reduction attempted and unsuccessful. ED  discussed with Dr. Dion Saucier and patient was transferred to [facility] where he underwent reduction in OR. [facility] accepted for admit.    Review of Systems: As mentioned in the history of present illness. All other systems reviewed and are negative. Past Medical History:  Diagnosis Date   Ambulates with cane    straight 4 prong cane   Anxiety    Cancer (HCC)    non hodgins  lymphoma   2004 in remission  Left arm  wears a brace there is a bone broken   CKD (chronic kidney disease) stage 3, GFR 30-59 ml/min (HCC)    COPD (chronic obstructive pulmonary disease) (HCC)    Depression    Dysrhythmia    SVT   GERD (gastroesophageal reflux disease)    Hearing loss    wears hearing aids   History of hiatal hernia    Hypertension    Pneumonia    x 1   Primary localized osteoarthritis of knee 09/26/2014   Primary localized osteoarthrosis, shoulder region, rotator cuff arthropathy 10/04/2013   Sleep apnea    uses cpap nightly   Past Surgical History:  Procedure Laterality Date   CARDIAC CATHETERIZATION     20 yrs. ago   CARDIOVERSION N/A 06/03/2022   Procedure: CARDIOVERSION;  Surgeon: Antonieta Iba, MD;  Location: [facility] ORS;  Service: Cardiovascular;  Laterality: N/A;   CHOLECYSTECTOMY     EYE SURGERY Left    pt states he was "seeing double" and they did surgery to fix  it   NASAL SINUS SURGERY     x2   REVERSE SHOULDER ARTHROPLASTY Right 10/04/2013   Procedure: REVERSE SHOULDER ARTHROPLASTY;  Surgeon: Eulas Post, MD;  Location: [facility] OR;  Service: Orthopedics;  Laterality: Right;   SHOULDER ACROMIOPLASTY Left    x 5 shoulder surgeries   SVT ABLATION N/A 03/07/2019   Procedure: SVT ABLATION;  Surgeon: Regan Lemming, MD;  Location: [facility] INVASIVE CV LAB;  Service: Cardiovascular;  Laterality: N/A;   TEE WITHOUT CARDIOVERSION N/A 06/03/2022   Procedure: TRANSESOPHAGEAL ECHOCARDIOGRAM;  Surgeon: Antonieta Iba, MD;  Location: [facility] ORS;  Service: Cardiovascular;  Laterality: N/A;   THORACIC  LAMINECTOMY FOR SPINAL CORD STIMULATOR N/A 02/19/2023   Procedure: THORACIC LAMINECTOMY AND PLACEMENT OF SPINAL CORD STIMULATOR AND BATTERY;  Surgeon: Estill Bamberg, MD;  Location: [facility] OR;  Service: Orthopedics;  Laterality: N/A;   TOTAL HIP ARTHROPLASTY Right 07/26/2019   Procedure: TOTAL HIP ARTHROPLASTY;  Surgeon: Teryl Lucy, MD;  Location: [facility] ORS;  Service: Orthopedics;  Laterality: Right;   TOTAL HIP ARTHROPLASTY Left 1998   TOTAL KNEE ARTHROPLASTY Right 09/26/2014   Procedure: RIGHT TOTAL KNEE ARTHROPLASTY;  Surgeon: Teryl Lucy, MD;  Location: [facility] OR;  Service: Orthopedics;  Laterality: Right;   TRANSFORAMINAL LUMBAR INTERBODY FUSION (TLIF) WITH PEDICLE SCREW FIXATION 2 LEVEL Right 06/05/2021   Procedure: RIGHT-SIDED LUMBAR 3- LUMBAR 4, LUMBAR 4- LUMBAR 5 TRANSFORAMINAL LUMBAR INTERBODY FUSION AND DECOMPRESSION WITH INSTRUMENTATION AND ALLOGRAFT;  Surgeon: Estill Bamberg, MD;  Location: [facility] OR;  Service: Orthopedics;  Laterality: Right;   VIDEO ASSISTED THORACOSCOPY (VATS) W/TALC PLEUADESIS Right 08/18/2017   Procedure: VIDEO ASSISTED THORACOSCOPY (VATS)POSSIBLE  W/TALC PLEUADESIS.POSSIBLE BLEBECTOMY;  Surgeon: Hulda Marin, MD;  Location: [facility] ORS;  Service: Thoracic;  Laterality: Right;   Social History:  reports that he quit smoking about 35 years ago. His smoking use included cigarettes. He started smoking about 55 years ago. He has a 40 pack-year smoking history. He has never used smokeless tobacco. He reports that he does not drink alcohol and does not use drugs.  Allergies  Allergen Reactions   Amoxicillin-Pot Clavulanate Nausea Only and Other (See Comments)    Did it involve swelling of the face/tongue/throat, SOB, or low BP? No  Did it involve sudden or severe rash/hives, skin peeling, or any reaction on the inside of your mouth or nose? No  Did you need to seek medical attention at a hospital or doctor's office? No  When did it last happen? More than 10 years  If all  above answers are "NO", may proceed with cephalosporin use.  Did it involve swelling of the face/tongue/throat, SOB, or low BP? No Did it involve sudden or severe rash/hives, skin peeling, or any reaction on the inside of your mouth or nose? No Did you need to seek medical attention at a hospital or doctor's office? No When did it last happen? More than 10 years If all above answers are "NO", may proceed with cephalosporin use.   Celecoxib Nausea Only and Other (See Comments)    Upset stomach   Oxycodone Other (See Comments)    Confusion, makes him "crazy"  Confusion, makes him "crazy"    Confusion, makes him "crazy" Confusion, makes him "crazy"    Family History  Problem Relation Age of Onset   Breast cancer Mother    Hypertension Mother    Rheum arthritis Father    Cancer Father    Lung cancer Brother    Stroke Paternal Grandfather  Prior to Admission medications   Medication Sig Start Date End Date Taking? Authorizing Provider  chlorhexidine (HIBICLENS) 4 % external liquid Apply 15 mLs (1 Application total) topically as directed for 30 doses. Use as directed daily for 5 days every other week for 6 weeks. 06/09/23  Yes Gawne, Meghan M, PA-C  mupirocin ointment (BACTROBAN) 2 % Place 1 Application into the nose 2 (two) times daily for 60 doses. Use as directed 2 times daily for 5 days every other week for 6 weeks. 06/09/23 07/09/23 Yes Gawne, Meghan M, PA-C  acetaminophen (TYLENOL) 500 MG tablet Take 1,000 mg by mouth every 6 (six) hours as needed for moderate pain (pain score 4-6).    [provider]  albuterol (PROVENTIL HFA;VENTOLIN HFA) 108 (90 Base) MCG/ACT inhaler Inhale 2 puffs into the lungs every 6 (six) hours as needed for wheezing or shortness of breath. 11/03/17   Merwyn Katos, MD  amiodarone (PACERONE) 200 MG tablet Take 1 tablet (200 mg total) by mouth daily. 06/27/22   Antonieta Iba, MD  apixaban (ELIQUIS) 2.5 MG TABS tablet Take 1 tablet (2.5 mg total) by  mouth 2 (two) times daily. 06/01/23   Antonieta Iba, MD  atorvastatin (LIPITOR) 80 MG tablet Take 1 tablet by mouth once daily 03/02/23   Antonieta Iba, MD  brimonidine-timolol (COMBIGAN) 0.2-0.5 % ophthalmic solution Place 1 drop into both eyes every 12 (twelve) hours.    [provider]  Budeson-Glycopyrrol-Formoterol (BREZTRI AEROSPHERE) 160-9-4.8 MCG/ACT AERO Inhale 2 puffs into the lungs in the morning and at bedtime. Patient not taking: Reported on 02/10/2023 10/17/22   Raechel Chute, MD  Calcium Carb-Cholecalciferol (CALCIUM 600 + D PO) Take 1 tablet by mouth daily.    [provider]  cephALEXin (KEFLEX) 500 MG capsule Take 1 capsule (500 mg total) by mouth 2 (two) times daily. Patient not taking: Reported on 02/10/2023 12/26/22   Tyrell Antonio, MD  FARXIGA 10 MG TABS tablet Take 1 tablet (10 mg total) by mouth daily. 03/23/23   Antonieta Iba, MD  fenofibrate 160 MG tablet Take 160 mg by mouth daily.     [provider]  ferrous sulfate 325 (65 FE) MG tablet Take 325 mg by mouth daily.    [provider]  fluticasone (FLONASE) 50 MCG/ACT nasal spray Place 2 sprays into both nostrils daily.     [provider]  furosemide (LASIX) 20 MG tablet Take 20 mg tablet once a week on Saturday & an additional 20 mg tablet as needed.    [provider]  gabapentin (NEURONTIN) 300 MG capsule Take 300 mg by mouth 3 (three) times daily.    [provider]  imipramine (TOFRANIL) 25 MG tablet Take 25 mg by mouth at bedtime.    [provider]  LUMIGAN 0.01 % SOLN Place 1 drop into both eyes at bedtime.    [provider]  magnesium oxide (MAG-OX) 400 MG tablet Take 400 mg by mouth 3 (three) times daily. 01/23/19   [provider]  methocarbamol (ROBAXIN) 750 MG tablet Take 1 tablet (750 mg total) by mouth every 6 (six) hours as needed for muscle spasms. 02/19/23   McKenzie, Eilene Ghazi, PA-C  metoprolol  succinate (TOPROL-XL) 25 MG 24 hr tablet Take 1 tablet by mouth in the evening 05/08/23   Antonieta Iba, MD  Multiple Vitamin (MULTIVITAMIN WITH MINERALS) TABS tablet Take 1 tablet by mouth daily.    [provider]  pantoprazole (  PROTONIX) 40 MG tablet Take 40 mg by mouth daily.     [provider]  polycarbophil (FIBERCON) 625 MG tablet Take 625 mg by mouth 2 (two) times daily.    [provider]  sertraline (ZOLOFT) 50 MG tablet Take 50 mg by mouth at bedtime.    [provider]    Physical Exam: Vitals:   06/09/23 1732 06/09/23 1745 06/09/23 1808 06/09/23 2010  BP: 131/68 (!) 157/87 (!) 154/72 132/80  Pulse: 86 84 82 90  Resp: 18 18 18 17   Temp:    97.7 F (36.5 C)  TempSrc:    Oral  SpO2: 94% 97% 93% 92%  Weight:      Height:       General:  Appears calm and comfortable and is in NAD Eyes:  PERRL, EOMI, normal lids, iris ENT:  grossly normal hearing, lips & tongue, mmm; poor dentition Neck:  no LAD, masses or thyromegaly; no carotid bruits Cardiovascular:  RRR, no m/r/g. No LE edema.  Respiratory:   CTA bilaterally with no wheezes/rales/rhonchi.  Normal respiratory effort. Abdomen:  soft, NT, ND, NABS Back:   normal alignment, no CVAT Skin:  no rash or induration seen on limited exam Musculoskeletal:  grossly normal tone BUE/BLE, good ROM, no bony abnormality Lower extremity:  No LE edema.  Limited foot exam with no ulcerations.  2+ distal pulses. Psychiatric:  grossly normal mood and affect, speech fluent and appropriate, AOx3 Neurologic:  CN 2-12 grossly intact, moves all extremities in coordinated fashion, sensation intact   Radiological Exams on Admission: Independently reviewed - see discussion in A/P where applicable  DG CHEST PORT 1 VIEW Result Date: 06/09/2023 CLINICAL DATA:  Shortness of breath EXAM: PORTABLE CHEST 1 VIEW COMPARISON:  05/28/2022 FINDINGS: Cardiac shadow is within normal limits. Aortic calcifications are seen.  The lungs are well aerated bilaterally. Calcified granuloma is noted in the right base. No focal infiltrate is seen. Mild chronic scarring is noted in the right costophrenic angle. Bilateral shoulder replacements are seen. No acute bony abnormality is noted. IMPRESSION: Chronic changes without acute abnormality. Electronically Signed   By: Alcide Clever M.D.   On: 06/09/2023 20:40   DG Pelvis Portable Result Date: 06/09/2023 CLINICAL DATA:  Elective surgery, postreduction. EXAM: PORTABLE PELVIS 1-2 VIEWS COMPARISON:  Radiograph yesterday FINDINGS: Two fluoroscopic spot views of the pelvis submitted from the operating room. The previous left hip arthroplasty dislocation has been reduced. Alignment is anatomic. No fracture is seen. IMPRESSION: Reduction of left arthroplasty dislocation.  No fracture is seen. Electronically Signed   By: Narda Rutherford M.D.   On: 06/09/2023 16:12   DG Hip Unilat W or Wo Pelvis 2-3 Views Left Result Date: 06/08/2023 CLINICAL DATA:  Left hip pain after fall. EXAM: DG HIP (WITH OR WITHOUT PELVIS) 2-3V LEFT COMPARISON:  Pelvic radiographs dated Aug 08, 2019. FINDINGS: The femoral component of the left hip prosthesis is dislocated posterosuperiorly. No periprosthetic fracture identified. Intact right hip arthroplasty. Lower lumbar fusion hardware in place. Sacroiliac joints and pubic symphysis appear anatomically aligned. IMPRESSION: Posterosuperior dislocation of the femoral component of the left hip prosthesis. No acute fracture identified. Electronically Signed   By: Hart Robinsons M.D.   On: 06/08/2023 15:13    EKG: pending    Labs on Admission: I have personally reviewed the available labs and imaging studies at the time of the admission.  Pertinent labs:    hgb: 11.4,  TSH: 9.102,  creatinine: 1.9,  Assessment and Plan:  Principal Problem:   Closed posterior dislocation of left hip (HCC) Active Problems:   Abnormal thyroid blood test   Cough   Essential  hypertension   Paroxysmal atrial fibrillation (HCC)   Hyperlipidemia   CKD (chronic kidney disease) stage 3, GFR 30-59 ml/min (HCC)   Depression with anxiety   GERD (gastroesophageal reflux disease)   Chronic anemia   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HCC)    Assessment and Plan: * Closed posterior dislocation of left hip (HCC) 78 year old male presenting to ED after bending over and having a popping sensation and pain in his left hip found to have superior dislocation of the femoral component of the left hip prosthesis without acute fracture. Reduction was unsuccessful in ED so he was transferred to [facility] for reduction in the OR  -obs to tele -underwent successful reduction in the OR by Dr. Eulah Pont  -per ortho note: Weightbearing as tolerated hip abduction orthosis. knee immobilizer for bed  -f/u on ortho recs   Abnormal thyroid blood test TSH has been trending upward with recent TSH of 9.1, free T4 pending Checking TPO antibodies Start low dose synthroid with outpatient f/u and labs with PCP in 6-8 weeks   Cough Has had chronic cough with no fever CXR with no acute process, no leukocytosis Likely has bronchitis, already had 2 rounds of antibiotics Discussed no indication for antibiotics at this point Conservative treatment   Essential hypertension Continue home toprol-xl PRN IV metoprolol   Paroxysmal atrial fibrillation (HCC) NSR  Continue eliquis and amiodarone    Hyperlipidemia Continue high intensity statin daily   CKD (chronic kidney disease) stage 3, GFR 30-59 ml/min (HCC) Baseline creatinine 2.0 Stable, continue to monitor   Depression with anxiety Continue zoloft daily   GERD (gastroesophageal reflux disease) Continue protonix daily   Chronic anemia Baseline hgb of 11 Stable   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HCC) In remission     Advance Care Planning:   Code Status: Full Code   Consults: ortho: Dr. Eulah Pont   DVT Prophylaxis: eliquis   Family Communication: none    Severity of Illness: The appropriate patient status for this patient is OBSERVATION. Observation status is judged to be reasonable and necessary in order to provide the required intensity of service to ensure the patient's safety. The patient's presenting symptoms, physical exam findings, and initial radiographic and laboratory data in the context of their medical condition is felt to place them at decreased risk for further clinical deterioration. Furthermore, it is anticipated that the patient will be medically stable for discharge from the hospital within 2 midnights of admission.   Author: Orland Mustard, MD 06/09/2023 9:25 PM  For on call review [email or web address].

## 2023-06-09 NOTE — H&P (Signed)
 ORTHOPAEDIC CONSULTATION  REQUESTING PHYSICIAN: Sheral Apley, MD  Time called na Time arrived na  Chief Complaint: left hip dislocation  HPI: Gavin Maxwell is a 78 y.o. male who complains of he was at the gym sitting on a recumbent bike when a piece fell off he reached down to the floor to grab it and dislocated his left hip.  He was had an attempted reduction in the emergency room that was unsuccessful.  He was then transferred here for definitive treatment.  He complains of some decreased sensation to his left foot.  Past Medical History:  Diagnosis Date   Ambulates with cane    straight 4 prong cane   Anxiety    Cancer (HCC)    non hodgins  lymphoma   2004 in remission  Left arm  wears a brace there is a bone broken   CKD (chronic kidney disease) stage 3, GFR 30-59 ml/min (HCC)    COPD (chronic obstructive pulmonary disease) (HCC)    Depression    Dysrhythmia    SVT   GERD (gastroesophageal reflux disease)    Hearing loss    wears hearing aids   History of hiatal hernia    Hypertension    Pneumonia    x 1   Primary localized osteoarthritis of knee 09/26/2014   Primary localized osteoarthrosis, shoulder region, rotator cuff arthropathy 10/04/2013   Sleep apnea    uses cpap nightly   Past Surgical History:  Procedure Laterality Date   CARDIAC CATHETERIZATION     20 yrs. ago   CARDIOVERSION N/A 06/03/2022   Procedure: CARDIOVERSION;  Surgeon: Antonieta Iba, MD;  Location: [facility] OR[facility];  Service: Cardiovascular;  Laterality: N/A;   CHOLECYSTECTOMY     EYE SURGERY Left    pt states he was "seeing double" and they did surgery to fix it   NASAL SINUS SURGERY     x2   REVERSE SHOULDER ARTHROPLASTY Right 10/04/2013   Procedure: REVERSE SHOULDER ARTHROPLASTY;  Surgeon: Eulas Post, MD;  Location: [facility];  Service: Orthopedics;  Laterality: Right;   SHOULDER ACROMIOPLASTY Left    x 5 shoulder surgeries   SVT ABLATION N/A 03/07/2019   Procedure: SVT  ABLATION;  Surgeon: Regan Lemming, MD;  Location: [facility];  Service: Cardiovascular;  Laterality: N/A;   TEE WITHOUT CARDIOVERSION N/A 06/03/2022   Procedure: TRANSESOPHAGEAL ECHOCARDIOGRAM;  Surgeon: Antonieta Iba, MD;  Location: [facility] OR[facility];  Service: Cardiovascular;  Laterality: N/A;   THORACIC LAMINECTOMY FOR SPINAL CORD STIMULATOR N/A 02/19/2023   Procedure: THORACIC LAMINECTOMY AND PLACEMENT OF SPINAL CORD STIMULATOR AND BATTERY;  Surgeon: Estill Bamberg, MD;  Location: [facility];  Service: Orthopedics;  Laterality: N/A;   TOTAL HIP ARTHROPLASTY Right 07/26/2019   Procedure: TOTAL HIP ARTHROPLASTY;  Surgeon: Teryl Lucy, MD;  Location: [facility];  Service: Orthopedics;  Laterality: Right;   TOTAL HIP ARTHROPLASTY Left 1998   TOTAL KNEE ARTHROPLASTY Right 09/26/2014   Procedure: RIGHT TOTAL KNEE ARTHROPLASTY;  Surgeon: Teryl Lucy, MD;  Location: [facility];  Service: Orthopedics;  Laterality: Right;   TRANSFORAMINAL LUMBAR INTERBODY FUSION (TLIF) WITH PEDICLE SCREW FIXATION 2 LEVEL Right 06/05/2021   Procedure: RIGHT-SIDED LUMBAR 3- LUMBAR 4, LUMBAR 4- LUMBAR 5 TRANSFORAMINAL LUMBAR INTERBODY FUSION AND DECOMPRESSION WITH INSTRUMENTATION AND ALLOGRAFT;  Surgeon: Estill Bamberg, MD;  Location: [facility];  Service: Orthopedics;  Laterality: Right;   VIDEO ASSISTED THORACOSCOPY (VATS) W/TALC PLEUADESIS Right 08/18/2017   Procedure: VIDEO ASSISTED THORACOSCOPY (  VATS)POSSIBLE  W/TALC PLEUADESIS.POSSIBLE BLEBECTOMY;  Surgeon: Hulda Marin, MD;  Location: [facility] OR[facility];  Service: Thoracic;  Laterality: Right;   Social History   Socioeconomic History   Marital status: Married    Spouse name: Not on file   Number of children: 2   Years of education: Not on file   Highest education level: Not on file  Occupational History   Not on file  Tobacco Use   Smoking status: Former    Current packs/day: 0.00    Average packs/day: 2.0 packs/day for 20.0 years (40.0 ttl pk-yrs)    Types: Cigarettes     Start date: 29    Quit date: 60    Years since quitting: 35.1   Smokeless tobacco: Never   Tobacco comments:    quit 25 years ago  Vaping Use   Vaping status: Never Used  Substance and Sexual Activity   Alcohol use: No   Drug use: No   Sexual activity: Not Currently    Birth control/protection: None  Other Topics Concern   Not on file  Social History Narrative   Not on file   Social Drivers of Health   Financial Resource Strain: Not on file  Food Insecurity: No Food Insecurity (05/28/2022)   Hunger Vital Sign    Worried About Running Out of Food in the Last Year: Never true    Ran Out of Food in the Last Year: Never true  Transportation Needs: No Transportation Needs (05/28/2022)   PRAPARE - Administrator, Civil Service (Medical): No    Lack of Transportation (Non-Medical): No  Physical Activity: Not on file  Stress: Not on file  Social Connections: Not on file   Family History  Problem Relation Age of Onset   Breast cancer Mother    Hypertension Mother    Rheum arthritis Father    Cancer Father    Lung cancer Brother    Stroke Paternal Grandfather    Allergies  Allergen Reactions   Amoxicillin-Pot Clavulanate Nausea Only and Other (See Comments)    Did it involve swelling of the face/tongue/throat, SOB, or low BP? No  Did it involve sudden or severe rash/hives, skin peeling, or any reaction on the inside of your mouth or nose? No  Did you need to seek medical attention at a hospital or doctor's office? No  When did it last happen? More than 10 years  If all above answers are "NO", may proceed with cephalosporin use.  Did it involve swelling of the face/tongue/throat, SOB, or low BP? No Did it involve sudden or severe rash/hives, skin peeling, or any reaction on the inside of your mouth or nose? No Did you need to seek medical attention at a hospital or doctor's office? No When did it last happen? More than 10 years If all above answers are "NO",  may proceed with cephalosporin use.   Celecoxib Nausea Only and Other (See Comments)    Upset stomach   Oxycodone Other (See Comments)    Confusion, makes him "crazy"  Confusion, makes him "crazy"    Confusion, makes him "crazy" Confusion, makes him "crazy"   Prior to Admission medications   Medication Sig Start Date End Date Taking? Authorizing Provider  acetaminophen (TYLENOL) 500 MG tablet Take 1,000 mg by mouth every 6 (six) hours as needed for moderate pain (pain score 4-6).    [provider]  albuterol (PROVENTIL HFA;VENTOLIN HFA) 108 (90 Base) MCG/ACT inhaler Inhale 2 puffs into the lungs  every 6 (six) hours as needed for wheezing or shortness of breath. 11/03/17   Merwyn Katos, MD  amiodarone (PACERONE) 200 MG tablet Take 1 tablet (200 mg total) by mouth daily. 06/27/22   Antonieta Iba, MD  apixaban (ELIQUIS) 2.5 MG TABS tablet Take 1 tablet (2.5 mg total) by mouth 2 (two) times daily. 06/01/23   Antonieta Iba, MD  atorvastatin (LIPITOR) 80 MG tablet Take 1 tablet by mouth once daily 03/02/23   Antonieta Iba, MD  brimonidine-timolol (COMBIGAN) 0.2-0.5 % ophthalmic solution Place 1 drop into both eyes every 12 (twelve) hours.    [provider]  Budeson-Glycopyrrol-Formoterol (BREZTRI AEROSPHERE) 160-9-4.8 MCG/ACT AERO Inhale 2 puffs into the lungs in the morning and at bedtime. Patient not taking: Reported on 02/10/2023 10/17/22   Raechel Chute, MD  Calcium Carb-Cholecalciferol (CALCIUM 600 + D PO) Take 1 tablet by mouth daily.    [provider]  cephALEXin (KEFLEX) 500 MG capsule Take 1 capsule (500 mg total) by mouth 2 (two) times daily. Patient not taking: Reported on 02/10/2023 12/26/22   Tyrell Antonio, MD  FARXIGA 10 MG TABS tablet Take 1 tablet (10 mg total) by mouth daily. 03/23/23   Antonieta Iba, MD  fenofibrate 160 MG tablet Take 160 mg by mouth daily.     [provider]  ferrous sulfate 325 (65 FE) MG tablet Take 325  mg by mouth daily.    [provider]  fluticasone (FLONASE) 50 MCG/ACT nasal spray Place 2 sprays into both nostrils daily.     [provider]  furosemide (LASIX) 20 MG tablet Take 20 mg tablet once a week on Saturday & an additional 20 mg tablet as needed.    [provider]  gabapentin (NEURONTIN) 300 MG capsule Take 300 mg by mouth 3 (three) times daily.    [provider]  imipramine (TOFRANIL) 25 MG tablet Take 25 mg by mouth at bedtime.    [provider]  LUMIGAN 0.01 % SOLN Place 1 drop into both eyes at bedtime.    [provider]  magnesium oxide (MAG-OX) 400 MG tablet Take 400 mg by mouth 3 (three) times daily. 01/23/19   [provider]  methocarbamol (ROBAXIN) 750 MG tablet Take 1 tablet (750 mg total) by mouth every 6 (six) hours as needed for muscle spasms. 02/19/23   McKenzie, Eilene Ghazi, PA-C  metoprolol succinate (TOPROL-XL) 25 MG 24 hr tablet Take 1 tablet by mouth in the evening 05/08/23   Antonieta Iba, MD  Multiple Vitamin (MULTIVITAMIN WITH MINERALS) TABS tablet Take 1 tablet by mouth daily.    [provider]  pantoprazole (PROTONIX) 40 MG tablet Take 40 mg by mouth daily.     [provider]  polycarbophil (FIBERCON) 625 MG tablet Take 625 mg by mouth 2 (two) times daily.    [provider]  sertraline (ZOLOFT) 50 MG tablet Take 50 mg by mouth at bedtime.    [provider]   DG Hip Unilat W or Wo Pelvis 2-3 Views Left Result Date: 06/08/2023 CLINICAL DATA:  Left hip pain after fall. EXAM: DG HIP (WITH OR WITHOUT PELVIS) 2-3V LEFT COMPARISON:  Pelvic radiographs dated Aug 08, 2019. FINDINGS: The femoral component of the left hip prosthesis is dislocated posterosuperiorly. No periprosthetic fracture identified. Intact right hip arthroplasty. Lower lumbar fusion hardware in place. Sacroiliac joints and pubic symphysis appear anatomically aligned. IMPRESSION: Posterosuperior  dislocation of the femoral component of the  left hip prosthesis. No acute fracture identified. Electronically Signed   By: Hart Robinsons M.D.   On: 06/08/2023 15:13    Positive ROS: All other systems have been reviewed and were otherwise negative with the exception of those mentioned in the HPI and as above.  Labs cbc No results for input(s): "WBC", "HGB", "HCT", "PLT" in the last 72 hours.  Labs inflam No results for input(s): "CRP" in the last 72 hours.  Invalid input(s): "ESR"  Labs coag No results for input(s): "INR", "PTT" in the last 72 hours.  Invalid input(s): "PT"  No results for input(s): "NA", "K", "CL", "CO2", "GLUCOSE", "BUN", "CREATININE", "CALCIUM" in the last 72 hours.  Physical Exam: Vitals:   06/09/23 1147  BP: (!) 161/91  Pulse: 81  Resp: 18  Temp: 97.8 F (36.6 C)  SpO2: 95%   General: Alert, no acute distress Cardiovascular: No pedal edema Respiratory: No cyanosis, no use of accessory musculature GI: No organomegaly, abdomen is soft and non-tender Skin: No lesions in the area of chief complaint other than those listed below in MSK exam.  Neurologic: Sensation intact distally save for the below mentioned MSK exam Psychiatric: Patient is competent for consent with normal mood and affect Lymphatic: No axillary or cervical lymphadenopathy  MUSCULOSKELETAL:  LLE: decreased global sensation to the RLE, gross sensation intact.  Motor intact Other extremities are atraumatic with painless ROM and NVI.  Assessment: Left prosthetic hip dislocation  Plan: I discussed risks and benefits of reduction of the hip under anesthesia he would like to go forward with that.   Sheral Apley, MD   06/09/2023 1:11 PM

## 2023-06-09 NOTE — Assessment & Plan Note (Signed)
 Baseline hgb of 11 Stable

## 2023-06-09 NOTE — Assessment & Plan Note (Signed)
 NSR  Continue eliquis and amiodarone

## 2023-06-09 NOTE — Assessment & Plan Note (Signed)
 78 year old male presenting to ED after bending over and having a popping sensation and pain in his left hip found to have superior dislocation of the femoral component of the left hip prosthesis without acute fracture. Reduction was unsuccessful in ED so he was transferred to [facility] for reduction in the OR  -obs to tele -underwent successful reduction in the OR by Dr. Eulah Pont  -per ortho note: Weightbearing as tolerated hip abduction orthosis. knee immobilizer for bed  -f/u on ortho recs

## 2023-06-09 NOTE — Anesthesia Postprocedure Evaluation (Signed)
 Anesthesia Post Note  Patient: Gavin Maxwell  Procedure(s) Performed: CLOSED REDUCTION HIP, LEFT (Left: Hip)     Patient location during evaluation: PACU Anesthesia Type: General Level of consciousness: awake and alert Pain management: pain level controlled Vital Signs Assessment: post-procedure vital signs reviewed and stable Respiratory status: spontaneous breathing, nonlabored ventilation, respiratory function stable and patient connected to nasal cannula oxygen Cardiovascular status: blood pressure returned to baseline and stable Postop Assessment: no apparent nausea or vomiting Anesthetic complications: no  No notable events documented.  Last Vitals:  Vitals:   06/09/23 1515 06/09/23 1530  BP: (!) 162/80 (!) 158/82  Pulse: 80 74  Resp: 15 17  Temp:    SpO2: 90% 94%    Last Pain:  Vitals:   06/09/23 1515  TempSrc:   PainSc: 0-No pain                 Shelton Silvas

## 2023-06-09 NOTE — Progress Notes (Signed)
 Spoke with Tammy from [place], Transportation set up for patient to arrive from [place] to [facility] short stay at [facility]

## 2023-06-09 NOTE — ED Notes (Signed)
 Spoke to [place] from [facility], patient will go to [place] short stay for surgery,  EMS will pick up around 1030 am today,  call report to [phone], attempted to call at 0650 and no answer

## 2023-06-09 NOTE — Assessment & Plan Note (Signed)
 TSH has been trending upward with recent TSH of 9.1, free T4 pending Checking TPO antibodies Start low dose synthroid with outpatient f/u and labs with PCP in 6-8 weeks

## 2023-06-09 NOTE — Assessment & Plan Note (Signed)
Continue protonix daily. 

## 2023-06-10 ENCOUNTER — Encounter (HOSPITAL_COMMUNITY): Payer: Self-pay | Admitting: Orthopedic Surgery

## 2023-06-10 DIAGNOSIS — T84021A Dislocation of internal left hip prosthesis, initial encounter: Secondary | ICD-10-CM | POA: Diagnosis not present

## 2023-06-10 DIAGNOSIS — S73015A Posterior dislocation of left hip, initial encounter: Secondary | ICD-10-CM | POA: Diagnosis not present

## 2023-06-10 LAB — BASIC METABOLIC PANEL
Anion gap: 9 (ref 5–15)
BUN: 24 mg/dL — ABNORMAL HIGH (ref 8–23)
CO2: 24 mmol/L (ref 22–32)
Calcium: 8.5 mg/dL — ABNORMAL LOW (ref 8.9–10.3)
Chloride: 100 mmol/L (ref 98–111)
Creatinine, Ser: 1.75 mg/dL — ABNORMAL HIGH (ref 0.61–1.24)
GFR, Estimated: 39 mL/min — ABNORMAL LOW (ref >60)
Glucose, Bld: 118 mg/dL — ABNORMAL HIGH (ref 70–99)
Potassium: 4.2 mmol/L (ref 3.5–5.1)
Sodium: 133 mmol/L — ABNORMAL LOW (ref 135–145)

## 2023-06-10 LAB — CBC
HCT: 32.8 % — ABNORMAL LOW (ref 39.0–52.0)
Hemoglobin: 10.3 g/dL — ABNORMAL LOW (ref 13.0–17.0)
MCH: 31.9 pg (ref 26.0–34.0)
MCHC: 31.4 g/dL (ref 30.0–36.0)
MCV: 101.5 fL — ABNORMAL HIGH (ref 80.0–100.0)
Platelets: 293 10*3/uL (ref 150–400)
RBC: 3.23 MIL/uL — ABNORMAL LOW (ref 4.22–5.81)
RDW: 13.9 % (ref 11.5–15.5)
WBC: 6.2 10*3/uL (ref 4.0–10.5)
nRBC: 0 % (ref 0.0–0.2)

## 2023-06-10 MED ORDER — SENNOSIDES-DOCUSATE SODIUM 8.6-50 MG PO TABS
1.0000 | ORAL_TABLET | Freq: Two times a day (BID) | ORAL | Status: DC
  Administered 2023-06-10 – 2023-06-12 (×5): 1 via ORAL
  Filled 2023-06-10 (×4): qty 1

## 2023-06-10 MED ORDER — POLYETHYLENE GLYCOL 3350 17 G PO PACK
17.0000 g | PACK | Freq: Every day | ORAL | Status: DC
Start: 2023-06-10 — End: 2023-06-11
  Administered 2023-06-10 – 2023-06-11 (×2): 17 g via ORAL
  Filled 2023-06-10: qty 1

## 2023-06-10 NOTE — Evaluation (Signed)
 Occupational Therapy Evaluation Patient Details Name: Gavin Maxwell MRN: [id number] DOB: 06-14-1945 Today's Date: 06/10/2023   History of Present Illness   Pt is 78 yo male admitted on 06/09/23 after posterior dislocation of L hip.  Pt underwent closed reduction in OR on 06/09/23.  Pt with other hx including HTN, CKD, COPD, non-Hodgkin lymphoma in remession, afib, R rTSA, Multiple L shoulder surgery with ongoing deficit, Thoracic laminectomy for spinal stimulator, R THA 2021, L THA 1998, R TKA 2016, L3/4 fusion     Clinical Impressions Pt typically functions modified independently and uses his cane or RW as needed. Pt is well equipped with DME at home from previous surgeries and chronic back pain. His wife participated in session and agrees she will be able to help manage pt at home with practice managing hip brace and KI. Pt and wife educated in posterior hip precautions and wearing schedules of hip brace and KI. Pt currently requires moderate assist for bed mobility and min assist with RW for OOB. He needs min to total assist for ADLs. Pt is familiar with AE for LB ADLs from previous surgeries. Will reinforce acutely. Recommending HHOT.      If plan is discharge home, recommend the following:   A little help with walking and/or transfers;A lot of help with bathing/dressing/bathroom;Assistance with cooking/housework;Assist for transportation;Help with stairs or ramp for entrance     Functional Status Assessment   Patient has had a recent decline in their functional status and demonstrates the ability to make significant improvements in function in a reasonable and predictable amount of time.     Equipment Recommendations   None recommended by OT     Recommendations for Other Services         Precautions/Restrictions   Precautions Precautions: Fall;Posterior Hip Precaution Booklet Issued: Yes (comment) Recall of Precautions/Restrictions: Intact Required Braces or Orthoses:  Other Brace Other Brace: Hip Abduction orthosis WITH MOBILIZING; Just KI when in BED Restrictions Weight Bearing Restrictions Per Provider Order: Yes LLE Weight Bearing Per Provider Order: Weight bearing as tolerated     Mobility Bed Mobility Overal bed mobility: Needs Assistance Bed Mobility: Supine to Sit     Supine to sit: Mod assist     General bed mobility comments: cues for technique, min assist for LEs to EOB, mod to raise trunk and to advance hips to EOB    Transfers Overall transfer level: Needs assistance Equipment used: Rolling walker (2 wheels) Transfers: Sit to/from Stand Sit to Stand: Min assist           General transfer comment: cues for technique from elevated surfaces      Balance Overall balance assessment: Needs assistance   Sitting balance-Leahy Scale: Fair     Standing balance support: Bilateral upper extremity supported Standing balance-Leahy Scale: Poor Standing balance comment: requires at least one hand on walker during static standing, B support for dynamic                           ADL either performed or assessed with clinical judgement   ADL Overall ADL's : Needs assistance/impaired Eating/Feeding: Minimal assistance;Sitting Eating/Feeding Details (indicate cue type and reason): assist to open containers and set up tray Grooming: Wash/dry hands;Sitting;Set up   Upper Body Bathing: Minimal assistance;Sitting   Lower Body Bathing: Total assistance;Sit to/from stand   Upper Body Dressing : Minimal assistance;Sitting Upper Body Dressing Details (indicate cue type and reason): front opening gown Lower  Body Dressing: Total assistance;Bed level   Toilet Transfer: Contact guard assist;Ambulation;Rolling walker (2 wheels);BSC/3in1   Toileting- Clothing Manipulation and Hygiene: Contact guard assist;Sit to/from stand       Functional mobility during ADLs: Contact guard assist;Rolling walker (2 wheels)       Vision  Baseline Vision/History: 1 Wears glasses Ability to See in Adequate Light: 0 Adequate Patient Visual Report: No change from baseline       Perception         Praxis         Pertinent Vitals/Pain Pain Assessment Pain Assessment: Faces Faces Pain Scale: Hurts a little bit Pain Location: L hip Pain Descriptors / Indicators: Tender     Extremity/Trunk Assessment Upper Extremity Assessment Upper Extremity Assessment: Right hand dominant;LUE deficits/detail LUE Deficits / Details: longstanding shoulder limitation, has a hinged elbow brace LUE Coordination: decreased gross motor   Lower Extremity Assessment Lower Extremity Assessment: Defer to PT evaluation RLE Deficits / Details: ROM WFL; MMT 5/5 LLE Deficits / Details: Expected changes; Educated on Posterior Precautions; Has Hip ABD orthosis in place; ROM: within posterior precautions; MMT: ankle 5/5, knee 3/5 not further tested, hip 2/5 within precautions   Cervical / Trunk Assessment Cervical / Trunk Assessment: Other exceptions Cervical / Trunk Exceptions: hx of back surgery and spinal stimulator, chronic back pain   Communication Communication Communication: No apparent difficulties   Cognition Arousal: Alert Behavior During Therapy: WFL for tasks assessed/performed Cognition: No apparent impairments                                       Cueing  General Comments      At EOB, educated pt and wife on don/doffing brace.  They were able to doff twice with supervision, and donned once with min cues.  Removed for return to bed.  Educated and assisted wife in applying KI in bed.   Exercises     Shoulder Instructions      Home Living Family/patient expects to be discharged to:: Private residence Living Arrangements: Spouse/significant other;Children Available Help at Discharge: Family;Available 24 hours/day Type of Home: House Home Access: Stairs to enter [employer] of Steps: 1 (grabs  door frame/door)   Home Layout: One level     Bathroom Shower/Tub: Producer, television/film/video: Handicapped height Bathroom Accessibility: Yes   Home Equipment: Agricultural consultant (2 wheels);BSC/3in1;Shower seat;Cane - quad;Cane - single point;Adaptive equipment Adaptive Equipment: Reacher;Sock aid Additional Comments: Does have a taller bed at home and has lifted his recliner 8 inches after prior surgeries so easier to rise.      Prior Functioning/Environment Prior Level of Function : Independent/Modified Independent             Mobility Comments: Pt ambulatory with SPC or RW; liked to go to gym (dislocated hip at gym) ADLs Comments: Independent with adls and iadls    OT Problem List: Decreased strength;Impaired balance (sitting and/or standing);Decreased knowledge of use of DME or AE;Decreased knowledge of precautions;Pain;Impaired UE functional use   OT Treatment/Interventions: Self-care/ADL training;DME and/or AE instruction;Therapeutic activities;Patient/family education;Balance training      OT Goals(Current goals can be found in the care plan section)   Acute Rehab OT Goals OT Goal Formulation: With patient/family Time For Goal Achievement: 06/24/23 Potential to Achieve Goals: Good ADL Goals Pt Will Perform Grooming: with supervision;standing Pt Will Perform Lower Body Bathing: with min assist;with adaptive  equipment;sit to/from stand Pt Will Perform Lower Body Dressing: with min assist;with adaptive equipment;sit to/from stand Pt Will Transfer to Toilet: with supervision;ambulating;bedside commode Pt Will Perform Toileting - Clothing Manipulation and hygiene: with supervision;sit to/from stand Additional ADL Goal #1: Pt will complete bed mobility with supervision, adhering to hip precautions.   OT Frequency:  Min 2X/week    Co-evaluation   Reason for Co-Treatment: For patient/therapist safety;Complexity of the patient's impairments (multi-system  involvement)          AM-PAC OT "6 Clicks" Daily Activity     Outcome Measure Help from another person eating meals?: A Little Help from another person taking care of personal grooming?: A Little Help from another person toileting, which includes using toliet, bedpan, or urinal?: A Little Help from another person bathing (including washing, rinsing, drying)?: A Lot Help from another person to put on and taking off regular upper body clothing?: A Little Help from another person to put on and taking off regular lower body clothing?: Total 6 Click Score: 15   End of Session Equipment Utilized During Treatment: Rolling walker (2 wheels);Gait belt;Other (comment) (hip abduction brace)  Activity Tolerance: Patient tolerated treatment well Patient left: in chair;with call bell/phone within reach;with chair alarm set  OT Visit Diagnosis: Unsteadiness on feet (R26.81);Other abnormalities of gait and mobility (R26.89);Pain;Muscle weakness (generalized) (M62.81)                Time: 6440-3474 OT Time Calculation (min): 40 min Charges:  OT General Charges $OT Visit: 1 Visit OT Evaluation $OT Eval Moderate Complexity: 1 Mod Berna Spare, OTR/L Acute Rehabilitation Services Office: [phone]  Evern Bio 06/10/2023, 3:05 PM

## 2023-06-10 NOTE — Evaluation (Signed)
 Physical Therapy Evaluation Patient Details Name: Gavin Maxwell MRN: [id number] DOB: May 09, 1945 Today's Date: 06/10/2023  History of Present Illness  Pt is 78 yo male admitted on 06/09/23 after posterior dislocation of L hip.  Pt underwent closed reduction in OR on 06/09/23.  Pt with other hx including HTN, CKD, COPD, non-Hodgkin lymphoma in remession, afib, R rTSA, Multiple L shoulder surgery with ongoing deficit, Thoracic laminectomy for spinal stimulator, R THA 2021, L THA 1998, R TKA 2016, L3/4 fusion  Clinical Impression  Pt admitted with above diagnosis. At baseline, pt ambulating with cane or RW.  He was independent and liked to stay active (was going to gym, actually dislocated on stationary bike leaning over to pick up something).  He has DME at home, elevated bed, and elevated chair.  Only has 1 step to enter home and has home support.  Today, pt motivated and with good pain control.  He was educated on posterior hip precautions, KI in bed, and hip abduction brace for OOB (recommended don/doff EOB). Pt required mod A for bed mobility, min A to stand, and then able to ambulate 74' with RW and CGA.  Pt expected to progress well with therapy and should be able to progress to home with HHPT level with a few sessions of PT.   Pt and wife in agreement. Also, did ask about transportation home and they have mid-size SUV which will allow pt ability to transfer and maintain hip precautions. Pt currently with functional limitations due to the deficits listed below (see PT Problem List). Pt will benefit from acute skilled PT to increase their independence and safety with mobility to allow discharge.           If plan is discharge home, recommend the following: A lot of help with walking and/or transfers;A little help with bathing/dressing/bathroom;Assistance with cooking/housework;Help with stairs or ramp for entrance   Can travel by private vehicle        Equipment Recommendations None recommended by  PT  Recommendations for Other Services       Functional Status Assessment Patient has had a recent decline in their functional status and demonstrates the ability to make significant improvements in function in a reasonable and predictable amount of time.     Precautions / Restrictions Precautions Precautions: Fall;Posterior Hip Precaution Booklet Issued: Yes (comment) Recall of Precautions/Restrictions: Intact Required Braces or Orthoses: Other Brace Other Brace: Hip Abduction orthosis WITH MOBILIZING; Just KI when in BED Restrictions Weight Bearing Restrictions Per Provider Order: Yes LLE Weight Bearing Per Provider Order: Weight bearing as tolerated      Mobility  Bed Mobility Overal bed mobility: Needs Assistance Bed Mobility: Supine to Sit     Supine to sit: Mod assist     General bed mobility comments: Requiring min A for legs to EOB and to lift trunk wtih mod A to scoot forward and cues for hip precautions.  Did get on on L side of bed and pt typically does R.  Also, reports could sleep in recliner if he had to    Transfers Overall transfer level: Needs assistance Equipment used: Rolling walker (2 wheels) Transfers: Sit to/from Stand Sit to Stand: Min assist           General transfer comment: Light min A from elevated bed but required more assist from recliner still within min A range.  Pt requiring cues for posterior precautions and not leaning forward    Ambulation/Gait Ambulation/Gait assistance: Contact guard assist Gait  Distance (Feet): 80 Feet (80'x2) Assistive device: Rolling walker (2 wheels) Gait Pattern/deviations: Trunk flexed Gait velocity: decreased     General Gait Details: Cues to stay close to RW; with fatigue starts to flex trunk (he was aware and request break); had chair follow for safety  Stairs            Wheelchair Mobility     Tilt Bed    Modified Rankin (Stroke Patients Only)       Balance Overall balance  assessment: Needs assistance Sitting-balance support: No upper extremity supported Sitting balance-Leahy Scale: Fair     Standing balance support: Bilateral upper extremity supported, Single extremity supported Standing balance-Leahy Scale: Poor Standing balance comment: Requirign UE support and CGA                             Pertinent Vitals/Pain Pain Assessment Pain Assessment: Faces Faces Pain Scale: Hurts a little bit Pain Location: L hip Pain Descriptors / Indicators: Tender Pain Intervention(s): Monitored during session, Repositioned, Premedicated before session, Limited activity within patient's tolerance    Home Living Family/patient expects to be discharged to:: Private residence Living Arrangements: Spouse/significant other;Children Available Help at Discharge: Family;Available 24 hours/day Type of Home: House Home Access: Stairs to enter   [employer] of Steps: 1 (grabs door frame/door)   Home Layout: One level Home Equipment: Agricultural consultant (2 wheels);BSC/3in1;Shower seat;Cane - quad;Cane - single point Additional Comments: Does have a taller bed at home and has lifted his recliner 8 inches after prior surgeries so easier to rise.    Prior Function Prior Level of Function : Independent/Modified Independent             Mobility Comments: Pt ambulatory with SPC or RW; liked to go to gym (dislocated hip at gym) ADLs Comments: Independent with adls and iadls     Extremity/Trunk Assessment   Upper Extremity Assessment Upper Extremity Assessment: Defer to OT evaluation (Has L shoulder deficits (chronic) - has brace he uses for stability but is not required)    Lower Extremity Assessment Lower Extremity Assessment: LLE deficits/detail;RLE deficits/detail RLE Deficits / Details: ROM WFL; MMT 5/5 LLE Deficits / Details: Expected changes; Educated on Posterior Precautions; Has Hip ABD orthosis in place; ROM: within posterior precautions;  MMT: ankle 5/5, knee 3/5 not further tested, hip 2/5 within precautions    Cervical / Trunk Assessment Cervical / Trunk Assessment: Normal;Other exceptions Cervical / Trunk Exceptions: hx of back surgery and spinal stimulator  Communication        Cognition Arousal: Alert Behavior During Therapy: WFL for tasks assessed/performed   PT - Cognitive impairments: No apparent impairments                                 Cueing       General Comments General comments (skin integrity, edema, etc.): Hip abduction brace and KI was in place at arrival with pt in bed.  Removed KI.  Transitioned pt to EOB. Did loosen hip band to 2X on each side (was 1x) to accomodate for 2 gowns front/back and for improved ability to don by wife/pt.  Brace with secure fit.  Educated pt and wife on KI in bed and hip abd brace OOB.  Discussed needing to remove abd brace in bed to prevent skin break down.  Recommending don/doffing at EOB in order to maintain hip precations  with sit to/from stand.  Educated on posterior hip precautions and provided handout    Exercises     Assessment/Plan    PT Assessment Patient needs continued PT services  PT Problem List Decreased strength;Decreased range of motion;Decreased activity tolerance;Decreased balance;Decreased mobility;Decreased knowledge of use of DME;Decreased knowledge of precautions;Pain       PT Treatment Interventions DME instruction;Therapeutic exercise;Gait training;Stair training;Functional mobility training;Therapeutic activities;Patient/family education;Balance training;Neuromuscular re-education;Modalities    PT Goals (Current goals can be found in the Care Plan section)  Acute Rehab PT Goals Patient Stated Goal: return home PT Goal Formulation: With patient/family Time For Goal Achievement: 06/24/23 Additional Goals Additional Goal #1: Pt and wife will be able to don/doff hip brace    Frequency Min 3X/week     Co-evaluation  PT/OT/SLP Co-Evaluation/Treatment: Yes Reason for Co-Treatment: For patient/therapist safety;Complexity of the patient's impairments (multi-system involvement)           AM-PAC PT "6 Clicks" Mobility  Outcome Measure Help needed turning from your back to your side while in a flat bed without using bedrails?: A Little Help needed moving from lying on your back to sitting on the side of a flat bed without using bedrails?: A Lot Help needed moving to and from a bed to a chair (including a wheelchair)?: A Little Help needed standing up from a chair using your arms (e.g., wheelchair or bedside chair)?: A Little Help needed to walk in hospital room?: A Little Help needed climbing 3-5 steps with a railing? : A Lot 6 Click Score: 16    End of Session Equipment Utilized During Treatment: Gait belt;Other (comment) (hip abd orthosis) Activity Tolerance: Patient tolerated treatment well Patient left: in chair;with call bell/phone within reach;with family/visitor present Nurse Communication: Mobility status PT Visit Diagnosis: Other abnormalities of gait and mobility (R26.89);Muscle weakness (generalized) (M62.81)    Time: 4401-0272 PT Time Calculation (min) (ACUTE ONLY): 50 min   Charges:   PT Evaluation $PT Eval Moderate Complexity: 1 Mod PT Treatments $Therapeutic Activity: 8-22 mins PT General Charges $$ ACUTE PT VISIT: 1 Visit         Anise Salvo, PT Acute Rehab [facility] Re[facility] [phone]   Rayetta Humphrey 06/10/2023, 1:42 PM

## 2023-06-10 NOTE — Plan of Care (Signed)
   Problem: Education: Goal: Knowledge of General Education information will improve Description: Including pain rating scale, medication(s)/side effects and non-pharmacologic comfort measures Outcome: Progressing   Problem: Activity: Goal: Risk for activity intolerance will decrease Outcome: Progressing   Problem: Pain Managment: Goal: General experience of comfort will improve and/or be controlled Outcome: Progressing

## 2023-06-10 NOTE — TOC Transition Note (Signed)
 Transition of Care [facility]) - Discharge Note   Patient Details  Name: Gavin Maxwell MRN: [id number] Date of Birth: June 29, 1945  Transition of Care [facility] CM/SW Contact:  Amada Jupiter, LCSW Phone Number: 06/10/2023, 2:56 PM   Clinical Narrative:     Met with pt and wife who confirm he has needed DME in the home.  PT has recommended HHPT follow up and pt agreeable with request for [facility] - referral placed.  No further TOC needs.  Final next level of care: Home w Home Health Services Barriers to Discharge: No Barriers Identified   Patient Goals and CMS Choice Patient states their goals for this hospitalization and ongoing recovery are:: return home          Discharge Placement                       Discharge Plan and Services Additional resources added to the After Visit Summary for                  DME Arranged: N/A DME Agency: NA       HH Arranged: PT HH Agency: [facility] Date [facility] Agency Contacted: 06/10/23 Time HH Agency Contacted: [phone] Representative spoke with at [facility] Agency: Amy Hyatt  Social Drivers of Health (SDOH) Interventions SDOH Screenings   Food Insecurity: No Food Insecurity (06/09/2023)  Housing: Low Risk  (06/09/2023)  Transportation Needs: No Transportation Needs (06/09/2023)  Utilities: Not At Risk (06/09/2023)  Social Connections: Patient Declined (06/09/2023)  Tobacco Use: Medium Risk (06/09/2023)     Readmission Risk Interventions    06/10/2023    2:55 PM 05/29/2022   11:22 AM  Readmission Risk Prevention Plan  Transportation Screening Complete Complete  PCP or Specialist Appt within 3-5 Days  Complete  Social Work Consult for Recovery Care Planning/Counseling  Complete  Palliative Care Screening  Not Applicable  Medication Review [employer]) Complete Complete  PCP or Specialist appointment within 3-5 days of discharge Complete   HRI or Home Care Consult Complete   SW Recovery Care/Counseling Consult Complete   Palliative  Care Screening Not Applicable   Skilled Nursing Facility Not Applicable

## 2023-06-10 NOTE — Progress Notes (Signed)
 PROGRESS NOTE  Gavin Maxwell  ONG:[id number] DOB: [birth year] DOA: 06/09/2023 PCP: Marguarite Arbour, MD   Brief Narrative: Patient is a 78 year old male with history of hypertension, CKD stage IIIb, COPD, non-Hodgkin's lymphoma in remission, paroxysmal A-fib on amiodarone and Eliquis who presented here with complaint of left hip pain.  He heard a pop on his left hip.  On presentation, imaging showed posterior superior dislocation of femoral compartment of the left hip prosthesis.  Reduction was attempted in the emergency department but was unsuccessful.  Orthopedics consulted.  Patient was transferred from [facility] ER to [facility] for orthopedic evaluation.  Underwent left hip reduction under anesthesia on 3/4.  PT/OT consulted,evaluation pending  Assessment & Plan:  Principal Problem:   Closed posterior dislocation of left hip (HCC) Active Problems:   Abnormal thyroid blood test   Cough   Essential hypertension   Paroxysmal atrial fibrillation (HCC)   Hyperlipidemia   CKD (chronic kidney disease) stage 3, GFR 30-59 ml/min (HCC)   Depression with anxiety   GERD (gastroesophageal reflux disease)   Chronic anemia   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HCC)  Dislocation of left hip: Presented with severe pain on the left hip.Imaging showed posterior superior dislocation of femoral compartment of the left hip prosthesis.  Reduction was attempted in the emergency department but was unsuccessful.  Orthopedics consulted.  Patient was transferred from [facility] ER to [facility] for orthopedic evaluation.  Underwent lfet hip reduction under anesthesia on 3/4.  PT/OT consulted.  Continue pain management, supportive care.  Might need SNF on discharge  Hypertension: Continue Toprol.  Monitor blood pressure  Paroxysmal A-fib: Currently in normal sinus rhythm.  Currently on Eliquis,metoprolol, amiodarone.  Being continued  Hyperlipidemia: On Lipitor  CKD stage IIIb: Baseline creatinine around 2.   Currently kidney function at baseline  Depression with anxiety: On Zoloft  GERD: Protonix  Chronic normocytic anemia: Currently hemoglobin stable, at baseline  Non-Hodgkin's lymphoma: Currently in remission  Abnormal thyroid function test: Both TSH, T4 elevated.  Will discontinue Synthyroid.  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  Repeat TFT in 6 to 8 weeks as an outpatient.  Constipation: Continue bowel regimen           DVT prophylaxis:apixaban (ELIQUIS) tablet 2.5 mg Start: 06/10/23 1000 SCDs Start: 06/09/23 1812 apixaban (ELIQUIS) tablet 2.5 mg     Code Status: Full Code  Family Communication: None at the bedside  Patient status:Inpatient  Patient is from :Home  Anticipated discharge to: Likely SNF  Estimated DC date:1-2 days, will discharge PT evaluation pending   Consultants: Orthopedics  Procedures: Closed reduction of left hip  Antimicrobials:  Anti-infectives (From admission, onward)    None       Subjective: Patient seen and examined the bedside today.  Overall comfortable.  Complains of some pain on the left hip.  Last bowel movement was on Monday.    Objective: Vitals:   06/09/23 2010 06/10/23 0201 06/10/23 0526 06/10/23 0939  BP: 132/80 112/63 130/75 119/65  Pulse: 90 73 69 84  Resp: 17 17 17 17   Temp: 97.7 F (36.5 C) 97.7 F (36.5 C) 97.7 F (36.5 C) 97.7 F (36.5 C)  TempSrc: Oral Oral Oral   SpO2: 92% 94% 92% 95%  Weight:      Height:        Intake/Output Summary (Last 24 hours) at 06/10/2023 1031 Last data filed at 06/10/2023 1026 Gross per 24 hour  Intake 920 ml  Output 1350 ml  Net -430 ml  Filed Weights   06/09/23 1241  Weight: 102.1 kg    Examination:  General exam: Overall comfortable, not in distress, obese HEENT: PERRL Respiratory system:  no wheezes or crackles  Cardiovascular system: S1 & S2 heard, RRR.  Gastrointestinal system: Abdomen is nondistended, soft and nontender. Central nervous system: Alert and  oriented Extremities: No edema, no clubbing ,no cyanosis, left knee immobilizer Skin: No rashes, no ulcers,no icterus     Data Reviewed: I have personally reviewed following labs and imaging studies  CBC: Recent Labs  Lab 06/10/23 0328  WBC 6.2  HGB 10.3*  HCT 32.8*  MCV 101.5*  PLT 293   Basic Metabolic Panel: Recent Labs  Lab 06/10/23 0328  NA 133*  K 4.2  CL 100  CO2 24  GLUCOSE 118*  BUN 24*  CREATININE 1.75*  CALCIUM 8.5*     No results found for this or any previous visit (from the past 240 hours).   Radiology Studies: DG CHEST PORT 1 VIEW Result Date: 06/09/2023 CLINICAL DATA:  Shortness of breath EXAM: PORTABLE CHEST 1 VIEW COMPARISON:  05/28/2022 FINDINGS: Cardiac shadow is within normal limits. Aortic calcifications are seen. The lungs are well aerated bilaterally. Calcified granuloma is noted in the right base. No focal infiltrate is seen. Mild chronic scarring is noted in the right costophrenic angle. Bilateral shoulder replacements are seen. No acute bony abnormality is noted. IMPRESSION: Chronic changes without acute abnormality. Electronically Signed   By: Alcide Clever M.D.   On: 06/09/2023 20:40   DG Pelvis Portable Result Date: 06/09/2023 CLINICAL DATA:  Elective surgery, postreduction. EXAM: PORTABLE PELVIS 1-2 VIEWS COMPARISON:  Radiograph yesterday FINDINGS: Two fluoroscopic spot views of the pelvis submitted from the operating room. The previous left hip arthroplasty dislocation has been reduced. Alignment is anatomic. No fracture is seen. IMPRESSION: Reduction of left arthroplasty dislocation.  No fracture is seen. Electronically Signed   By: Narda Rutherford M.D.   On: 06/09/2023 16:12   DG Hip Unilat W or Wo Pelvis 2-3 Views Left Result Date: 06/08/2023 CLINICAL DATA:  Left hip pain after fall. EXAM: DG HIP (WITH OR WITHOUT PELVIS) 2-3V LEFT COMPARISON:  Pelvic radiographs dated Aug 08, 2019. FINDINGS: The femoral component of the left hip prosthesis is  dislocated posterosuperiorly. No periprosthetic fracture identified. Intact right hip arthroplasty. Lower lumbar fusion hardware in place. Sacroiliac joints and pubic symphysis appear anatomically aligned. IMPRESSION: Posterosuperior dislocation of the femoral component of the left hip prosthesis. No acute fracture identified. Electronically Signed   By: Hart Robinsons M.D.   On: 06/08/2023 15:13    Scheduled Meds:  amiodarone  200 mg Oral Daily   apixaban  2.5 mg Oral BID   atorvastatin  80 mg Oral Daily   brimonidine  1 drop Both Eyes BID   And   timolol  1 drop Both Eyes BID   dapagliflozin propanediol  10 mg Oral Daily   docusate sodium  100 mg Oral BID   fenofibrate  160 mg Oral Daily   ferrous sulfate  325 mg Oral Daily   fluticasone  2 spray Each Nare Daily   gabapentin  300 mg Oral TID   imipramine  25 mg Oral QHS   latanoprost  1 drop Both Eyes QHS   levothyroxine  25 mcg Oral Q0600   magnesium oxide  400 mg Oral TID   metoprolol succinate  25 mg Oral QPM   pantoprazole  40 mg Oral Daily   polycarbophil  625 mg  Oral BID   sertraline  50 mg Oral QHS   Continuous Infusions:   LOS: 1 day   Burnadette Pop, MD [facility] P3/08/2023, 10:31 AM

## 2023-06-10 NOTE — Progress Notes (Signed)
 Physical Therapy Treatment Patient Details Name: Gavin Maxwell MRN: [id number] DOB: 1945-07-15 Today's Date: 06/10/2023   History of Present Illness Pt is 78 yo male admitted on 06/09/23 after posterior dislocation of L hip.  Pt underwent closed reduction in OR on 06/09/23.  Pt with other hx including HTN, CKD, COPD, non-Hodgkin lymphoma in remession, afib, R rTSA, Multiple L shoulder surgery with ongoing deficit, Thoracic laminectomy for spinal stimulator, R THA 2021, L THA 1998, R TKA 2016, L3/4 fusion    PT Comments  Pt making gradual progress.  He needs min A to stand and ambulated 25' with RW and CGA>  Required cues for hip precautions and RW proximity.  Pt and wife able to don/doff hip orthosis with min cues.  Will need to practice don/doffing brace with clothes, progress transfers (pt gets off R side of bed), and perform stairs prior to return home.  Did report daughter present in morning/evening to assist some if needed.  Hopeful for continued progression to be able to return home with family and HHPT.     If plan is discharge home, recommend the following: A lot of help with walking and/or transfers;A little help with bathing/dressing/bathroom;Assistance with cooking/housework;Help with stairs or ramp for entrance   Can travel by private vehicle        Equipment Recommendations  None recommended by PT    Recommendations for Other Services       Precautions / Restrictions Precautions Precautions: Fall;Posterior Hip Precaution Booklet Issued: Yes (comment) Recall of Precautions/Restrictions: Intact Required Braces or Orthoses: Other Brace Other Brace: Hip Abduction orthosis WITH MOBILIZING; Just KI when in BED Restrictions Weight Bearing Restrictions Per Provider Order: Yes LLE Weight Bearing Per Provider Order: Weight bearing as tolerated     Mobility  Bed Mobility Overal bed mobility: Needs Assistance Bed Mobility: Sit to Supine     Supine to sit: Mod assist Sit to  supine: Min assist   General bed mobility comments: Assist for L LE back to bed; cues for using R LE to scoot hips over and cues for precautions.  Went back to bed on R side as pt does at home    Transfers Overall transfer level: Needs assistance Equipment used: Rolling walker (2 wheels) Transfers: Sit to/from Stand Sit to Stand: Min assist           General transfer comment: Min A to stand from recliner with cues for hip precautions.  Pt reports chair at home taller    Ambulation/Gait Ambulation/Gait assistance: Contact guard assist Gait Distance (Feet): 80 Feet Assistive device: Rolling walker (2 wheels) Gait Pattern/deviations: Trunk flexed Gait velocity: decreased     General Gait Details: Cues to stay inside RW; had 1 episode of RW going to far forward requiring min A to recover   Stairs             Wheelchair Mobility     Tilt Bed    Modified Rankin (Stroke Patients Only)       Balance Overall balance assessment: Needs assistance Sitting-balance support: No upper extremity supported Sitting balance-Leahy Scale: Good     Standing balance support: Bilateral upper extremity supported Standing balance-Leahy Scale: Poor Standing balance comment: Requirign UE support and CGA                            Communication    Cognition Arousal: Alert Behavior During Therapy: WFL for tasks assessed/performed   PT -  Cognitive impairments: No apparent impairments                                Cueing    Exercises      General Comments General comments (skin integrity, edema, etc.): At EOB, educated pt and wife on don/doffing brace.  They were able to doff twice with supervision, and donned once with min cues.  Removed for return to bed.  Educated and assisted wife in applying KI in bed.      Pertinent Vitals/Pain Pain Assessment Pain Assessment: No/denies pain Faces Pain Scale: Hurts a little bit Pain Location: L hip Pain  Descriptors / Indicators: Tender Pain Intervention(s): Monitored during session, Repositioned, Premedicated before session, Limited activity within patient's tolerance    Home Living Family/patient expects to be discharged to:: Private residence Living Arrangements: Spouse/significant other;Children Available Help at Discharge: Family;Available 24 hours/day Type of Home: House Home Access: Stairs to enter   [employer] of Steps: 1 (grabs door frame/door)   Home Layout: One level Home Equipment: Agricultural consultant (2 wheels);BSC/3in1;Shower seat;Cane - quad;Cane - single point Additional Comments: Does have a taller bed at home and has lifted his recliner 8 inches after prior surgeries so easier to rise.    Prior Function            PT Goals (current goals can now be found in the care plan section) Acute Rehab PT Goals Patient Stated Goal: return home PT Goal Formulation: With patient/family Time For Goal Achievement: 06/24/23 Additional Goals Additional Goal #1: Pt and wife will be able to don/doff hip brace Progress towards PT goals: Progressing toward goals    Frequency    Min 3X/week      PT Plan      Co-evaluation PT/OT/SLP Co-Evaluation/Treatment: Yes Reason for Co-Treatment: For patient/therapist safety;Complexity of the patient's impairments (multi-system involvement)          AM-PAC PT "6 Clicks" Mobility   Outcome Measure  Help needed turning from your back to your side while in a flat bed without using bedrails?: A Little Help needed moving from lying on your back to sitting on the side of a flat bed without using bedrails?: A Lot Help needed moving to and from a bed to a chair (including a wheelchair)?: A Little Help needed standing up from a chair using your arms (e.g., wheelchair or bedside chair)?: A Little Help needed to walk in hospital room?: A Little Help needed climbing 3-5 steps with a railing? : A Lot 6 Click Score: 16    End of  Session Equipment Utilized During Treatment: Gait belt;Other (comment) (hip orthosis OOB; KI in bed) Activity Tolerance: Patient tolerated treatment well Patient left: with call bell/phone within reach;with family/visitor present;in bed;with bed alarm set Nurse Communication: Mobility status PT Visit Diagnosis: Other abnormalities of gait and mobility (R26.89);Muscle weakness (generalized) (M62.81)     Time: 1610-9604 PT Time Calculation (min) (ACUTE ONLY): 38 min  Charges:    $Gait Training: 8-22 mins $Therapeutic Activity: 23-37 mins PT General Charges $$ ACUTE PT VISIT: 1 Visit                     Anise Salvo, PT Acute Rehab [facility] [phone]    Rayetta Humphrey 06/10/2023, 2:41 PM

## 2023-06-10 NOTE — Progress Notes (Signed)
 Subjective: Patient reports pain as moderate.  Tolerating diet.  Urinating.   No CP, SOB.  Has mobilized OOB with PT. Wearing hip abduction orthosis.  Objective:   VITALS:   Vitals:   06/09/23 2010 06/10/23 0201 06/10/23 0526 06/10/23 0939  BP: 132/80 112/63 130/75 119/65  Pulse: 90 73 69 84  Resp: 17 17 17 17   Temp: 97.7 F (36.5 C) 97.7 F (36.5 C) 97.7 F (36.5 C) 97.7 F (36.5 C)  TempSrc: Oral Oral Oral   SpO2: 92% 94% 92% 95%  Weight:      Height:          Latest Ref Rng & Units 06/10/2023    3:28 AM 05/30/2023    3:26 PM 02/11/2023   11:50 AM  CBC  WBC 4.0 - 10.5 K/uL 6.2  5.4  3.3   Hemoglobin 13.0 - 17.0 g/dL 16.1  09.6  04.5   Hematocrit 39.0 - 52.0 % 32.8  35.3  34.4   Platelets 150 - 400 K/uL 293  247  204       Latest Ref Rng & Units 06/10/2023    3:28 AM 05/30/2023    3:26 PM 02/11/2023   11:50 AM  BMP  Glucose 70 - 99 mg/dL 409  811  98   BUN 8 - 23 mg/dL 24  39  35   Creatinine 0.61 - 1.24 mg/dL 9.14  7.82  9.56   Sodium 135 - 145 mmol/L 133  137  135   Potassium 3.5 - 5.1 mmol/L 4.2  4.4  5.0   Chloride 98 - 111 mmol/L 100  100  100   CO2 22 - 32 mmol/L 24  26  26    Calcium 8.9 - 10.3 mg/dL 8.5  9.0  9.0    Intake/Output      03/04 0701 03/05 0700 03/05 0701 03/06 0700   P.O. 420    I.V. (mL/kg) 500 (4.9)    Total Intake(mL/kg) 920 (9)    Urine (mL/kg/hr) 1000 350 (0.5)   Total Output 1000 350   Net -80 -350           Physical Exam: General: NAD.  Sitting up in bedside chair, calm, comfortable Resp: No increased wob Cardio: regular rate and rhythm ABD soft Neurologically intact MSK Neurovascularly intact Sensation intact distally Intact pulses distally Dorsiflexion/Plantar flexion intact Incision: dressing C/D/I Hip brace on  Assessment: 1 Day Post-Op  S/P Procedure(s) (LRB): CLOSED REDUCTION HIP, LEFT (Left) by Dr. Jewel Baize. Eulah Pont on 06/09/23  Principal Problem:   Closed posterior dislocation of left hip (HCC) Active  Problems:   Chronic anemia   CKD (chronic kidney disease) stage 3, GFR 30-59 ml/min (HCC)   Essential hypertension   GERD (gastroesophageal reflux disease)   Hyperlipidemia   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HCC)   Depression with anxiety   Paroxysmal atrial fibrillation (HCC)   Abnormal thyroid blood test   Cough   Plan: Posterior hip precautions due to dislocation  Advance diet Up with therapy Incentive Spirometry Elevate and Apply ice  Weightbearing: WBAT LLE while in hip brace Orthopedic device(s):  hip brace Showering: Keep dressing dry VTE prophylaxis:  Eliquis 2.5mg  bid  , SCDs, ambulation Pain control: Tramadol PRN Follow - up plan: 1 week in office with Dr. Margie Ege information:  Margarita Rana MD, Levester Fresh PA-C  Dispo:  Ok to discharge home from ortho standpoint when medically ready.      Jenne Pane, PA-C Office [phone]  06/10/2023, 2:18 PM

## 2023-06-10 NOTE — Care Management Obs Status (Signed)
 MEDICARE OBSERVATION STATUS NOTIFICATION   Patient Details  Name: Gavin Maxwell MRN: [id number] Date of Birth: 1945-09-03   Medicare Observation Status Notification Given:  Hart Robinsons, LCSW 06/10/2023, 2:55 PM

## 2023-06-11 DIAGNOSIS — T84021A Dislocation of internal left hip prosthesis, initial encounter: Secondary | ICD-10-CM | POA: Diagnosis not present

## 2023-06-11 DIAGNOSIS — S73015A Posterior dislocation of left hip, initial encounter: Secondary | ICD-10-CM | POA: Diagnosis not present

## 2023-06-11 LAB — BASIC METABOLIC PANEL
Anion gap: 9 (ref 5–15)
BUN: 28 mg/dL — ABNORMAL HIGH (ref 8–23)
CO2: 24 mmol/L (ref 22–32)
Calcium: 8.6 mg/dL — ABNORMAL LOW (ref 8.9–10.3)
Chloride: 101 mmol/L (ref 98–111)
Creatinine, Ser: 1.63 mg/dL — ABNORMAL HIGH (ref 0.61–1.24)
GFR, Estimated: 42 mL/min — ABNORMAL LOW (ref >60)
Glucose, Bld: 85 mg/dL (ref 70–99)
Potassium: 4.4 mmol/L (ref 3.5–5.1)
Sodium: 134 mmol/L — ABNORMAL LOW (ref 135–145)

## 2023-06-11 LAB — CBC
HCT: 32.9 % — ABNORMAL LOW (ref 39.0–52.0)
Hemoglobin: 10.2 g/dL — ABNORMAL LOW (ref 13.0–17.0)
MCH: 31.6 pg (ref 26.0–34.0)
MCHC: 31 g/dL (ref 30.0–36.0)
MCV: 101.9 fL — ABNORMAL HIGH (ref 80.0–100.0)
Platelets: 289 10*3/uL (ref 150–400)
RBC: 3.23 MIL/uL — ABNORMAL LOW (ref 4.22–5.81)
RDW: 13.9 % (ref 11.5–15.5)
WBC: 5.7 10*3/uL (ref 4.0–10.5)
nRBC: 0 % (ref 0.0–0.2)

## 2023-06-11 MED ORDER — POLYETHYLENE GLYCOL 3350 17 G PO PACK
17.0000 g | PACK | Freq: Two times a day (BID) | ORAL | Status: DC
  Administered 2023-06-12: 17 g via ORAL
  Filled 2023-06-11 (×2): qty 1

## 2023-06-11 NOTE — Progress Notes (Signed)
 Physical Therapy Treatment Patient Details Name: Gavin Maxwell MRN: [id number] DOB: 04/13/1945 Today's Date: 06/11/2023   History of Present Illness Pt is 78 yo male admitted on 06/09/23 after posterior dislocation of L hip.  Pt underwent closed reduction in OR on 06/09/23.  Pt with other hx including HTN, CKD, COPD, non-Hodgkin lymphoma in remession, afib, R rTSA, Multiple L shoulder surgery with ongoing deficit, Thoracic laminectomy for spinal stimulator, R THA 2021, L THA 1998, R TKA 2016, L3/4 fusion    PT Comments  POD # 2 am session PT - Cognition Comments: AxO x 3 pleasant but a little "foggy" on new information and required repeat instructions esp with THP during sit to stand and stand to sit. Also need reminder "knee immobilizer is to be worn while IN BED and Hip Brace is to be worn for OOB activity esp walking". Pt was already OOB in recliner.  Was on BSC earlier.  Having issues of constipation.  Assisted with amb in hallway was limited.  General transfer comment: Pt had difficulty self rising from recliner and required MOD assist using a safety belt which Theraoist used to "pull" him up.  Pt only has one good arm.  L UE is limited.  Max VC's to avoid forward flexion and need to avoid hip flex > 90 degress during sit to stand as well as stand to sit. General transfer comment: Pt had difficulty self rising from recliner and required MOD assist using a safety belt which Theraoist used to "pull" him up.  Pt only has one good arm.  L UE is limited.  Max VC's to avoid forward flexion and need to avoid hip flex > 90 degress during sit to stand as well as stand to sit. Practiced ONE step he has to enter his home.  Pt has difficulty and required much assist. Pt plans to return home with Spouse who is unable to physically assist pt due to her back.  Pt needs to be able to perform at Supervision level.   Will see pt again after lunch.  Expect he will need "one more day" before D/C to home.    If plan is  discharge home, recommend the following: A lot of help with walking and/or transfers;A little help with bathing/dressing/bathroom;Assistance with cooking/housework;Help with stairs or ramp for entrance   Can travel by private vehicle        Equipment Recommendations  None recommended by PT    Recommendations for Other Services       Precautions / Restrictions Precautions Precautions: Fall;Posterior Hip Recall of Precautions/Restrictions: Impaired Precaution/Restrictions Comments: THP and "bad" L UE limited shoulder ROM Other Brace: Hip Abduction orthosis WITH MOBILIZING OOB and KI when in BED Restrictions Weight Bearing Restrictions Per Provider Order: No LLE Weight Bearing Per Provider Order: Weight bearing as tolerated     Mobility  Bed Mobility               General bed mobility comments: Pt was OOB in recliner    Transfers Overall transfer level: Needs assistance Equipment used: Rolling walker (2 wheels) Transfers: Sit to/from Stand, Bed to chair/wheelchair/BSC Sit to Stand: Mod assist           General transfer comment: Pt had difficulty self rising from recliner and required MOD assist using a safety belt which Theraoist used to "pull" him up.  Pt only has one good arm.  L UE is limited.  Max VC's to avoid forward flexion and need to avoid  hip flex > 90 degress during sit to stand as well as stand to sit.    Ambulation/Gait Ambulation/Gait assistance: Min assist Gait Distance (Feet): 30 Feet Assistive device: Rolling walker (2 wheels) Gait Pattern/deviations: Trunk flexed Gait velocity: decreased     General transfer comment: Pt had difficulty self rising from recliner and required MOD assist using a safety belt which Theraoist used to "pull" him up.  Pt only has one good arm.  L UE is limited.  Max VC's to avoid forward flexion and need to avoid hip flex > 90 degress during sit to stand as well as stand to sit.   Stairs Stairs: Yes Stairs assistance:  Mod assist, Max assist Stair Management: No rails, Step to pattern, Forwards Number of Stairs: 1 General stair comments: practiced ONE step with walker no rail was difficult and required Mod/Max Assist unsteady.   Wheelchair Mobility     Tilt Bed    Modified Rankin (Stroke Patients Only)       Balance                                            Communication    Cognition Arousal: Alert Behavior During Therapy: WFL for tasks assessed/performed   PT - Cognitive impairments: No apparent impairments                       PT - Cognition Comments: AxO x 3 pleasant but a little "foggy" on new information and required repeat instructions esp with THP during sit to stand and stand to sit.        Cueing    Exercises      General Comments        Pertinent Vitals/Pain Pain Assessment Pain Assessment: 0-10 Pain Score: 5  Pain Location: L hip Pain Descriptors / Indicators: Tender, Operative site guarding, Discomfort Pain Intervention(s): Monitored during session, Premedicated before session, Repositioned    Home Living                          Prior Function            PT Goals (current goals can now be found in the care plan section) Progress towards PT goals: Progressing toward goals    Frequency    Min 3X/week      PT Plan      Co-evaluation              AM-PAC PT "6 Clicks" Mobility   Outcome Measure  Help needed turning from your back to your side while in a flat bed without using bedrails?: A Lot Help needed moving from lying on your back to sitting on the side of a flat bed without using bedrails?: A Lot Help needed moving to and from a bed to a chair (including a wheelchair)?: A Lot Help needed standing up from a chair using your arms (e.g., wheelchair or bedside chair)?: A Lot Help needed to walk in hospital room?: A Lot Help needed climbing 3-5 steps with a railing? : A Lot 6 Click Score: 12    End  of Session Equipment Utilized During Treatment: Gait belt Activity Tolerance: Patient limited by fatigue Patient left: in chair;with call bell/phone within reach Nurse Communication: Mobility status PT Visit Diagnosis: Other abnormalities of gait and mobility (R26.89);Muscle weakness (generalized) (  M62.81)     Time: 1105-1130 PT Time Calculation (min) (ACUTE ONLY): 25 min  Charges:    $Gait Training: 8-22 mins $Therapeutic Activity: 8-22 mins PT General Charges $$ ACUTE PT VISIT: 1 Visit                     Felecia Shelling  PTA Acute  Rehabilitation Services Office M-F          [phone]

## 2023-06-11 NOTE — Progress Notes (Signed)
 PROGRESS NOTE  Gavin Maxwell  ZOX:[id number] DOB: [birth year] DOA: 06/09/2023 PCP: Marguarite Arbour, MD   Brief Narrative: Patient is a 78 year old male with history of hypertension, CKD stage IIIb, COPD, non-Hodgkin's lymphoma in remission, paroxysmal A-fib on amiodarone and Eliquis who presented here with complaint of left hip pain.  He heard a pop on his left hip.  On presentation, imaging showed posterior superior dislocation of femoral compartment of the left hip prosthesis.  Reduction was attempted in the emergency department but was unsuccessful.  Orthopedics consulted.  Patient was transferred from [facility] ER to [facility] for orthopedic evaluation.  Underwent left hip reduction under anesthesia on 3/4.  PT/OT consulted, recommended home health.  PT recommending to keep him on the day  Assessment & Plan:  Principal Problem:   Closed posterior dislocation of left hip (HCC) Active Problems:   Abnormal thyroid blood test   Cough   Essential hypertension   Paroxysmal atrial fibrillation (HCC)   Hyperlipidemia   CKD (chronic kidney disease) stage 3, GFR 30-59 ml/min (HCC)   Depression with anxiety   GERD (gastroesophageal reflux disease)   Chronic anemia   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HCC)  Dislocation of left hip: Presented with severe pain on the left hip.Imaging showed posterior superior dislocation of femoral compartment of the left hip prosthesis.  Reduction was attempted in the emergency department but was unsuccessful.  Orthopedics consulted.  Patient was transferred from [facility] ER to [facility] for orthopedic evaluation.  Underwent lfet hip reduction under anesthesia on 3/4.  PT/OT consulted.  Recommended home health. continue pain management, supportive care.  Might need SNF.  Hypertension: Continue Toprol.  Monitor blood pressure  Paroxysmal A-fib: Currently in normal sinus rhythm.  Currently on Eliquis,metoprolol, amiodarone.  Being continued  Hyperlipidemia: On  Lipitor  CKD stage IIIb: Baseline creatinine around 2.  Currently kidney function at baseline  Depression with anxiety: On Zoloft  GERD: Protonix  Chronic normocytic anemia: Currently hemoglobin stable, at baseline  Non-Hodgkin's lymphoma: Currently in remission  Abnormal thyroid function test: Both TSH, T4 elevated.  Will discontinue Synthyroid.  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  Repeat TFT in 6 to 8 weeks as an outpatient.  Constipation: Continue bowel regimen           DVT prophylaxis:apixaban (ELIQUIS) tablet 2.5 mg Start: 06/10/23 1000 SCDs Start: 06/09/23 1812 apixaban (ELIQUIS) tablet 2.5 mg     Code Status: Full Code  Family Communication: None at the bedside  Patient status:Inpatient  Patient is from :Home  Anticipated discharge to: Home  Estimated DC date: Tomorrow as requested by  PT   Consultants: Orthopedics  Procedures: Closed reduction of left hip  Antimicrobials:  Anti-infectives (From admission, onward)    None       Subjective: Patient seen and examined at bedside today.  Overall comfortable.  Lying on bed.  Denies any significant pain.  Waiting for PT evaluation today.  No bowel movement yet.  Objective: Vitals:   06/10/23 0526 06/10/23 0939 06/10/23 2219 06/11/23 0648  BP: 130/75 119/65 127/71 137/80  Pulse: 69 84 72 72  Resp: 17 17 16 16   Temp: 97.7 F (36.5 C) 97.7 F (36.5 C) 97.8 F (36.6 C) (!) 97.5 F (36.4 C)  TempSrc: Oral  Oral   SpO2: 92% 95% 92% 96%  Weight:      Height:        Intake/Output Summary (Last 24 hours) at 06/11/2023 1204 Last data filed at 06/11/2023 1029 Gross per  24 hour  Intake 360 ml  Output 1000 ml  Net -640 ml   Filed Weights   06/09/23 1241  Weight: 102.1 kg    Examination:  General exam: Overall comfortable, not in distress,obese HEENT: PERRL Respiratory system:  no wheezes or crackles  Cardiovascular system: S1 & S2 heard, RRR.  Gastrointestinal system: Abdomen is  nondistended, soft and nontender. Central nervous system: Alert and oriented Extremities: No edema, no clubbing ,no cyanosis, left knee immobilizer Skin: No rashes, no ulcers,no icterus     Data Reviewed: I have personally reviewed following labs and imaging studies  CBC: Recent Labs  Lab 06/10/23 0328 06/11/23 0320  WBC 6.2 5.7  HGB 10.3* 10.2*  HCT 32.8* 32.9*  MCV 101.5* 101.9*  PLT 293 289   Basic Metabolic Panel: Recent Labs  Lab 06/10/23 0328 06/11/23 0320  NA 133* 134*  K 4.2 4.4  CL 100 101  CO2 24 24  GLUCOSE 118* 85  BUN 24* 28*  CREATININE 1.75* 1.63*  CALCIUM 8.5* 8.6*     No results found for this or any previous visit (from the past 240 hours).   Radiology Studies: DG CHEST PORT 1 VIEW Result Date: 06/09/2023 CLINICAL DATA:  Shortness of breath EXAM: PORTABLE CHEST 1 VIEW COMPARISON:  05/28/2022 FINDINGS: Cardiac shadow is within normal limits. Aortic calcifications are seen. The lungs are well aerated bilaterally. Calcified granuloma is noted in the right base. No focal infiltrate is seen. Mild chronic scarring is noted in the right costophrenic angle. Bilateral shoulder replacements are seen. No acute bony abnormality is noted. IMPRESSION: Chronic changes without acute abnormality. Electronically Signed   By: Alcide Clever M.D.   On: 06/09/2023 20:40   DG Pelvis Portable Result Date: 06/09/2023 CLINICAL DATA:  Elective surgery, postreduction. EXAM: PORTABLE PELVIS 1-2 VIEWS COMPARISON:  Radiograph yesterday FINDINGS: Two fluoroscopic spot views of the pelvis submitted from the operating room. The previous left hip arthroplasty dislocation has been reduced. Alignment is anatomic. No fracture is seen. IMPRESSION: Reduction of left arthroplasty dislocation.  No fracture is seen. Electronically Signed   By: Narda Rutherford M.D.   On: 06/09/2023 16:12    Scheduled Meds:  amiodarone  200 mg Oral Daily   apixaban  2.5 mg Oral BID   atorvastatin  80 mg Oral Daily    brimonidine  1 drop Both Eyes BID   And   timolol  1 drop Both Eyes BID   dapagliflozin propanediol  10 mg Oral Daily   fenofibrate  160 mg Oral Daily   ferrous sulfate  325 mg Oral Daily   fluticasone  2 spray Each Nare Daily   gabapentin  300 mg Oral TID   imipramine  25 mg Oral QHS   latanoprost  1 drop Both Eyes QHS   magnesium oxide  400 mg Oral TID   metoprolol succinate  25 mg Oral QPM   pantoprazole  40 mg Oral Daily   polycarbophil  625 mg Oral BID   polyethylene glycol  17 g Oral Daily   senna-docusate  1 tablet Oral BID   sertraline  50 mg Oral QHS   Continuous Infusions:   LOS: 1 day   Burnadette Pop, MD [facility] P3/09/2023, 12:04 PM

## 2023-06-11 NOTE — Progress Notes (Signed)
 Physical Therapy Treatment Patient Details Name: Gavin Maxwell MRN: [id number] DOB: 06-11-1945 Today's Date: 06/11/2023   History of Present Illness Pt is 78 yo male admitted on 06/09/23 after posterior dislocation of L hip.  Pt underwent closed reduction in OR on 06/09/23.  Pt with other hx including HTN, CKD, COPD, non-Hodgkin lymphoma in remession, afib, R rTSA, Multiple L shoulder surgery with ongoing deficit, Thoracic laminectomy for spinal stimulator, R THA 2021, L THA 1998, R TKA 2016, L3/4 fusion    PT Comments  POD # 2 pm session Pt was back in bed and took a "little nap".  Pt was in bed with ABD brace on vs KI.  Re Educated KI is for IN BED and ABD Brace is for OOB/walking.  Assisted PT OOB to amb.  General bed mobility comments: demonstarted and Educated how to use a belt to self assist/guide L LE off and back onto bed. General transfer comment: Pt had difficulty self rising from recliner and required MOD assist using a safety belt which Theraoist used to "pull" him up.  Pt only has one good arm.  L UE is limited.  Max VC's to avoid forward flexion and need to avoid hip flex > 90 degress during sit to stand as well as stand to sit. General Gait Details: tolerated an increased distance of 30 feet x 2 with one seated break.  VC's for upright posture and to avoid "sloppy shuffled steps".  Assisted back to bed.  Removed ABD brace and applied KI.  Positioned to comfort. Pt progressing slowly and needs to be Supervision level as his "small" wife has a "bad" back and can not assist Pt physically.     If plan is discharge home, recommend the following: A lot of help with walking and/or transfers;A little help with bathing/dressing/bathroom;Assistance with cooking/housework;Help with stairs or ramp for entrance   Can travel by private vehicle        Equipment Recommendations  None recommended by PT    Recommendations for Other Services       Precautions / Restrictions  Precautions Precautions: Fall;Posterior Hip Recall of Precautions/Restrictions: Impaired Precaution/Restrictions Comments: THP and "bad" L UE limited shoulder ROM Other Brace: Hip Abduction orthosis WITH MOBILIZING OOB and KI when in BED Restrictions Weight Bearing Restrictions Per Provider Order: No LLE Weight Bearing Per Provider Order: Weight bearing as tolerated     Mobility  Bed Mobility Overal bed mobility: Needs Assistance Bed Mobility: Supine to Sit, Sit to Supine     Supine to sit: Mod assist, HOB elevated, Used rails Sit to supine: Mod assist   General bed mobility comments: demonstarted and Educated how to use a belt to self assist/guide L LE off and back onto bed.    Transfers Overall transfer level: Needs assistance Equipment used: Rolling walker (2 wheels) Transfers: Sit to/from Stand, Bed to chair/wheelchair/BSC Sit to Stand: Mod assist, Min assist           General transfer comment: Pt had difficulty self rising from recliner and required MOD assist using a safety belt which Theraoist used to "pull" him up.  Pt only has one good arm.  L UE is limited.  Max VC's to avoid forward flexion and need to avoid hip flex > 90 degress during sit to stand as well as stand to sit.    Ambulation/Gait Ambulation/Gait assistance: Min assist Gait Distance (Feet): 60 Feet Assistive device: Rolling walker (2 wheels) Gait Pattern/deviations: Trunk flexed Gait velocity: decreased  General Gait Details: tolerated an increased distance of 30 feet x 2 with one seated break.  VC's for upright posture and to avoid "sloppy shuufled steps".   Stairs Wheelchair Mobility     Tilt Bed    Modified Rankin (Stroke Patients Only)       Balance                                            Communication    Cognition Arousal: Alert Behavior During Therapy: WFL for tasks assessed/performed   PT - Cognitive impairments: No apparent impairments                        PT - Cognition Comments: AxO x 3 pleasant but a little "foggy" on new information and required repeat instructions esp with THP during sit to stand and stand to sit.        Cueing    Exercises      General Comments        Pertinent Vitals/Pain Pain Assessment Pain Assessment: 0-10 Pain Score: 5  Pain Location: L hip Pain Descriptors / Indicators: Tender, Operative site guarding, Discomfort Pain Intervention(s): Monitored during session, Premedicated before session, Repositioned    Home Living                          Prior Function            PT Goals (current goals can now be found in the care plan section) Progress towards PT goals: Progressing toward goals    Frequency    Min 3X/week      PT Plan      Co-evaluation              AM-PAC PT "6 Clicks" Mobility   Outcome Measure  Help needed turning from your back to your side while in a flat bed without using bedrails?: A Lot Help needed moving from lying on your back to sitting on the side of a flat bed without using bedrails?: A Lot Help needed moving to and from a bed to a chair (including a wheelchair)?: A Lot Help needed standing up from a chair using your arms (e.g., wheelchair or bedside chair)?: A Lot Help needed to walk in hospital room?: A Lot Help needed climbing 3-5 steps with a railing? : A Lot 6 Click Score: 12    End of Session Equipment Utilized During Treatment: Gait belt Activity Tolerance: Patient tolerated treatment well Patient left: in bed;with call bell/phone within reach;with bed alarm set Nurse Communication: Mobility status PT Visit Diagnosis: Other abnormalities of gait and mobility (R26.89);Muscle weakness (generalized) (M62.81)     Time: 1422-1450 PT Time Calculation (min) (ACUTE ONLY): 28 min  Charges:    $Gait Training: 8-22 mins $Therapeutic Activity: 8-22 mins PT General Charges $$ ACUTE PT VISIT: 1 Visit                      Felecia Shelling  PTA Acute  Rehabilitation Services Office M-F          [phone]

## 2023-06-11 NOTE — Progress Notes (Signed)
 Occupational Therapy Treatment Patient Details Name: Gavin Maxwell MRN: [id number] DOB: 15-Jun-1945 Today's Date: 06/11/2023   History of present illness Pt is 78 yo male admitted on 06/09/23 after posterior dislocation of L hip.  Pt underwent closed reduction in OR on 06/09/23.  Pt with other hx including HTN, CKD, COPD, non-Hodgkin lymphoma in remession, afib, R rTSA, Multiple L shoulder surgery with ongoing deficit, Thoracic laminectomy for spinal stimulator, R THA 2021, L THA 1998, R TKA 2016, L3/4 fusion   OT comments  Patient progressing slowly but steadily.  Patient did have an increase in his impact score of occupational performance from a 16/24 to a 17/24 due to patient demonstrating increased knowledge of long handled adaptive equipment and assisting with lower body dressing.  Patient is still limited by decreased memory for posterior hip precautions, need of up to moderate assist to stand from elevated surfaces, and need of moderate assist for lower body dressing.  This OT did express concerns to patient regarding his wife working in him being alone at home.  Patient however continues to decline any kind of inpatient skilled nursing rehab as he said he has done that before and does not want to repeat it.  Given this, OT recommends as much supervision for patient as possible as well as assistance with all IADLs as well as lower body ADLs as patient shows difficulty with recall.      If plan is discharge home, recommend the following:  A little help with walking and/or transfers;A lot of help with bathing/dressing/bathroom;Assistance with cooking/housework;Assist for transportation;Help with stairs or ramp for entrance   Equipment Recommendations  None recommended by OT    Recommendations for Other Services      Precautions / Restrictions Precautions Precautions: Fall;Posterior Hip Recall of Precautions/Restrictions: Impaired Precaution/Restrictions Comments: Able to ID 2/3.  Tends  to mix up no IR and say no ER of hip.  Cued and educated with handout in reach to reinforce. Required Braces or Orthoses: Other Brace Other Brace: Hip Abduction orthosis WITH MOBILIZING; Just KI when in BED Restrictions Weight Bearing Restrictions Per Provider Order: No LLE Weight Bearing Per Provider Order: Weight bearing as tolerated       Mobility Bed Mobility Overal bed mobility: Needs Assistance Bed Mobility: Supine to Sit     Supine to sit: Mod assist, HOB elevated, Used rails     General bed mobility comments: Pt with limied B shoulder mobility and struggles with moving to LT side of bed. Pt choose this side to exit then when 1/2 out, pt remembered he finds the RT side easier.    Transfers Overall transfer level: Needs assistance Equipment used: Rolling walker (2 wheels) Transfers: Sit to/from Stand, Bed to chair/wheelchair/BSC Sit to Stand: Mod assist, Min assist (Min As from elevated EOB. Mod As from recliner with pillows added to elevatte for Hip prec)     Step pivot transfers: Min assist, Mod assist     General transfer comment: Cues for hand and foot placement each transition. Pt stood from EOB x 2 for ADLs. Stood from recliner x 1 for ADLs.     Balance Overall balance assessment: Needs assistance Sitting-balance support: No upper extremity supported Sitting balance-Leahy Scale: Fair     Standing balance support: Bilateral upper extremity supported, Reliant on assistive device for balance Standing balance-Leahy Scale: Poor  ADL either performed or assessed with clinical judgement   ADL Overall ADL's :   Grooming: Oral care; Min As chair level. Pt shown how to position RW anterior to sink for safety.  Upper Body Bathing: Set up;Sitting Lower Body Bathing: Moderate assistance;Sitting/lateral leans;Sit to/from stand Lower Body Bathing Details (indicate cue type and reason): recommend use of LH sponge while seated in  shower and to have very close supervision. Pt advised to dry off ad lib before mobilizing out of shower.  Upper Body Dressing : Set up;Sitting Lower Body Dressing: Moderate Assist after training;Sitting/lateral leans;Sit to/from stand Lower Body Dressing Details (indicate cue type and reason): demo use of reacher + sock aid for LB dressing, pt return demo with adequate technique.  Pt would need an x-wide sock aid for success. Cues with doffing socks to avoid IR.  Donned PJ pants with adaptive equipment at EOB level then stood to doff and don over hips with Min As.  Verbal cues for hip precautions as needed.  Toilet Transfer Details (indicate cue type and reason): Pt educated on positioning of BSC over toilet or by bed at night. Indicated use of angling BSC to support post hip precautions.    Toileting- Clothing Manipulation and Hygiene: Mod Assist ;Sit to/from stand. Pt educated on turning towards non-operative hip to comply with hip precautions.  Functional mobility during ADLs: Min-Mod As; Cueing for safety;Cueing for sequencing;Rolling walker General ADL Comments: patient requiring increased assistance for self care tasks due to B LE pain impacting activity tolerance, safety awareness, overall balance             Extremity/Trunk Assessment Upper Extremity Assessment Upper Extremity Assessment: RUE deficits/detail RUE Deficits / Details: Limied shoulder movement but not as severe as LT limitations. LUE Deficits / Details: longstanding shoulder limitation, has a hinged elbow brace LUE Coordination: decreased gross motor            Vision Ability to See in Adequate Light: 0 Adequate     Perception     Praxis     Communication Communication Communication: No apparent difficulties   Cognition Arousal: Alert Behavior During Therapy: WFL for tasks assessed/performed Cognition: Cognition impaired       Memory impairment (select all impairments): Short-term memory Attention  impairment (select first level of impairment): Selective attention Executive functioning impairment (select all impairments): Sequencing (Cues as trying to don sock on same foot. Cues for step by step with use of AE)                   Following commands: Intact        Cueing   Cueing Techniques: Verbal cues, Gestural cues, Tactile cues, Visual cues (demo)  Exercises      Shoulder Instructions       General Comments      Pertinent Vitals/ Pain       Pain Assessment Pain Assessment: No/denies pain  Home Living                                          Prior Functioning/Environment              Frequency  Min 2X/week        Progress Toward Goals  OT Goals(current goals can now be found in the care plan section)  Progress towards OT goals: Progressing toward goals  Acute Rehab OT Goals  OT Goal Formulation: With patient Time For Goal Achievement: 06/24/23 Potential to Achieve Goals: Good  Plan      Co-evaluation                 AM-PAC OT "6 Clicks" Daily Activity     Outcome Measure   Help from another person eating meals?: A Little Help from another person taking care of personal grooming?: A Little Help from another person toileting, which includes using toliet, bedpan, or urinal?: A Little Help from another person bathing (including washing, rinsing, drying)?: A Lot Help from another person to put on and taking off regular upper body clothing?: A Little Help from another person to put on and taking off regular lower body clothing?: A Lot 6 Click Score: 16    End of Session Equipment Utilized During Treatment: Rolling walker (2 wheels);Gait belt;Other (comment) (hip ABD brace)  OT Visit Diagnosis: Unsteadiness on feet (R26.81);Other abnormalities of gait and mobility (R26.89);Pain;Muscle weakness (generalized) (M62.81)   Activity Tolerance Patient tolerated treatment well   Patient Left in chair;with call bell/phone  within reach;with chair alarm set   Nurse Communication          Time: [phone] OT Time Calculation (min): 47 min  Charges: OT General Charges $OT Visit: 1 Visit OT Treatments $Self Care/Home Management : 23-37 mins $Therapeutic Activity: 8-22 mins  Victorino Dike, OT Acute Rehab Services Office: [phone] 06/11/2023   Theodoro Clock 06/11/2023, 10:26 AM

## 2023-06-11 NOTE — Plan of Care (Signed)
   Problem: Coping: Goal: Level of anxiety will decrease Outcome: Progressing   Problem: Pain Managment: Goal: General experience of comfort will improve and/or be controlled Outcome: Progressing   Problem: Safety: Goal: Ability to remain free from injury will improve Outcome: Progressing

## 2023-06-12 ENCOUNTER — Other Ambulatory Visit (HOSPITAL_COMMUNITY): Payer: Self-pay

## 2023-06-12 DIAGNOSIS — S73015A Posterior dislocation of left hip, initial encounter: Secondary | ICD-10-CM | POA: Diagnosis not present

## 2023-06-12 DIAGNOSIS — T84021A Dislocation of internal left hip prosthesis, initial encounter: Secondary | ICD-10-CM | POA: Diagnosis not present

## 2023-06-12 LAB — THYROID ANTIBODIES (THYROPEROXIDASE & THYROGLOBULIN): Thyroperoxidase Ab SerPl-aCnc: 18 [IU]/mL (ref 0–34)

## 2023-06-12 MED ORDER — POLYETHYLENE GLYCOL 3350 17 GM/SCOOP PO POWD
17.0000 g | Freq: Every day | ORAL | 0 refills | Status: DC
  Filled 2023-06-12: qty 238, 14d supply, fill #0

## 2023-06-12 MED ORDER — BISACODYL 10 MG RE SUPP: 10.0000 mg | Freq: Once | RECTAL | Status: DC

## 2023-06-12 MED ORDER — TRAMADOL HCL 50 MG PO TABS
50.0000 mg | ORAL_TABLET | Freq: Four times a day (QID) | ORAL | 0 refills | Status: DC | PRN
Start: 2023-06-12 — End: 2023-10-01
  Filled 2023-06-12: qty 20, 5d supply, fill #0

## 2023-06-12 MED ORDER — ORAL CARE MOUTH RINSE: 15.0000 mL | OROMUCOSAL | Status: DC | PRN

## 2023-06-12 MED ORDER — SENNOSIDES-DOCUSATE SODIUM 8.6-50 MG PO TABS
1.0000 | ORAL_TABLET | Freq: Two times a day (BID) | ORAL | 0 refills | Status: AC
  Filled 2023-06-12: qty 14, 7d supply, fill #0

## 2023-06-12 NOTE — Discharge Summary (Addendum)
 Physician Discharge Summary  Gavin Maxwell ZOX:[id number] DOB: Aug 31, 1945 DOA: 06/09/2023  PCP: Marguarite Arbour, MD  Admit date: 06/09/2023 Discharge date: 06/12/2023  Admitted From: Home Disposition:  Home  Discharge Condition:Stable CODE STATUS:FULL Diet recommendation: Heart Healthy  Brief/Interim Summary: Patient is a 78 year old male with history of hypertension, CKD stage IIIb, COPD, non-Hodgkin's lymphoma in remission, paroxysmal A-fib on amiodarone and Eliquis who presented here with complaint of left hip pain.  He heard a pop on his left hip.  On presentation, imaging showed posterior superior dislocation of femoral compartment of the left hip prosthesis.  Reduction was attempted in the emergency department but was unsuccessful.  Orthopedics consulted.  Patient was transferred from [facility] ER to [facility] for orthopedic evaluation.  Underwent left hip reduction under anesthesia on 3/4.  PT/OT consulted, recommended home health.   Following problems were addressed during the hospitalization: Dislocation of left hip: Presented with severe pain on the left hip.Imaging showed posterior superior dislocation of femoral compartment of the left hip prosthesis.  Reduction was attempted in the emergency department but was unsuccessful.  Orthopedics consulted.  Patient was transferred from [facility] ER to [facility] for orthopedic evaluation.  Underwent lfet hip reduction under anesthesia on 3/4.  PT/OT consulted.  Recommended home health. continue pain management, supportive care.     Hypertension: Continue Toprol at home   Paroxysmal A-fib: Currently in normal sinus rhythm.  Currently on Eliquis,metoprolol, amiodarone.  Being continued   Hyperlipidemia: On Lipitor   CKD stage IIIb: Baseline creatinine around 2.  Currently kidney function at baseline   Depression with anxiety: On Zoloft   GERD: Protonix   Chronic normocytic anemia: Currently hemoglobin stable, at baseline    Non-Hodgkin's lymphoma: Currently in remission   Abnormal thyroid function test: Both TSH, T4 elevated.  Will discontinue Synthyroid.  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  Repeat TFT in 6 to 8 weeks as an outpatient.   Constipation: Continue bowel regimen  Discharge Diagnoses:  Principal Problem:   Closed posterior dislocation of left hip (HCC) Active Problems:   Abnormal thyroid blood test   Cough   Essential hypertension   Paroxysmal atrial fibrillation (HCC)   Hyperlipidemia   CKD (chronic kidney disease) stage 3, GFR 30-59 ml/min (HCC)   Depression with anxiety   GERD (gastroesophageal reflux disease)   Chronic anemia   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HCC)    Discharge Instructions  Discharge Instructions     Diet - low sodium heart healthy   Complete by: As directed    Discharge instructions   Complete by: As directed    1)Please take your medications as instructed 2)Follow up with orthopedics as an outpatient in a week.  Name and number of the provider has been attached   Increase activity slowly   Complete by: As directed       Allergies as of 06/12/2023       Reactions   Amoxicillin-pot Clavulanate Nausea Only, Other (See Comments)   Did it involve swelling of the face/tongue/throat, SOB, or low BP? No Did it involve sudden or severe rash/hives, skin peeling, or any reaction on the inside of your mouth or nose? No Did you need to seek medical attention at a hospital or doctor's office? No When did it last happen? More than 10 years If all above answers are "NO", may proceed with cephalosporin use. Did it involve swelling of the face/tongue/throat, SOB, or low BP? No Did it involve sudden or severe rash/hives, skin peeling,  or any reaction on the inside of your mouth or nose? No Did you need to seek medical attention at a hospital or doctor's office? No When did it last happen? More than 10 years If all above answers are "NO", may proceed with cephalosporin use.    Celecoxib Nausea Only, Other (See Comments)   Upset stomach   Oxycodone Other (See Comments)   Confusion, makes him "crazy" Confusion, makes him "crazy"    Confusion, makes him "crazy" Confusion, makes him "crazy"        Medication List     STOP taking these medications    doxycycline 100 MG tablet Commonly known as: VIBRA-TABS       TAKE these medications    acetaminophen 500 MG tablet Commonly known as: TYLENOL Take 1,000 mg by mouth every 6 (six) hours as needed for moderate pain (pain score 4-6).   albuterol 108 (90 Base) MCG/ACT inhaler Commonly known as: VENTOLIN HFA Inhale 2 puffs into the lungs every 6 (six) hours as needed for wheezing or shortness of breath.   amiodarone 200 MG tablet Commonly known as: PACERONE Take 1 tablet (200 mg total) by mouth daily.   apixaban 2.5 MG Tabs tablet Commonly known as: Eliquis Take 1 tablet (2.5 mg total) by mouth 2 (two) times daily.   atorvastatin 80 MG tablet Commonly known as: LIPITOR Take 1 tablet by mouth once daily   brimonidine-timolol 0.2-0.5 % ophthalmic solution Commonly known as: COMBIGAN Place 1 drop into both eyes every 12 (twelve) hours.   CALCIUM 600 + D PO Take 1 tablet by mouth daily.   Farxiga 10 MG Tabs tablet Generic drug: dapagliflozin propanediol Take 1 tablet (10 mg total) by mouth daily.   fenofibrate 160 MG tablet Take 160 mg by mouth daily.   ferrous sulfate 325 (65 FE) MG tablet Take 325 mg by mouth daily.   fluticasone 50 MCG/ACT nasal spray Commonly known as: FLONASE Place 2 sprays into both nostrils daily.   furosemide 20 MG tablet Commonly known as: LASIX Take 20 mg tablet once a week on Saturday & an additional 20 mg tablet as needed.   gabapentin 300 MG capsule Commonly known as: NEURONTIN Take 300 mg by mouth 3 (three) times daily.   imipramine 25 MG tablet Commonly known as: TOFRANIL Take 25 mg by mouth at bedtime.   Lumigan 0.01 % Soln Generic drug:  bimatoprost Place 1 drop into both eyes at bedtime.   magnesium oxide 400 MG tablet Commonly known as: MAG-OX Take 400 mg by mouth 3 (three) times daily.   methocarbamol 750 MG tablet Commonly known as: ROBAXIN Take 1 tablet (750 mg total) by mouth every 6 (six) hours as needed for muscle spasms.   metoprolol succinate 25 MG 24 hr tablet Commonly known as: TOPROL-XL Take 1 tablet by mouth in the evening   multivitamin with minerals Tabs tablet Take 1 tablet by mouth daily.   pantoprazole 40 MG tablet Commonly known as: PROTONIX Take 40 mg by mouth daily.   polycarbophil 625 MG tablet Commonly known as: FIBERCON Take 625 mg by mouth 2 (two) times daily.   polyethylene glycol powder 17 GM/SCOOP powder Commonly known as: GLYCOLAX/MIRALAX Dissolve 17 g in liquid and take by mouth daily.   sertraline 50 MG tablet Commonly known as: ZOLOFT Take 50 mg by mouth at bedtime.   Stool Softener/Laxative 50-8.6 MG tablet Generic drug: senna-docusate Take 1 tablet by mouth 2 (two) times daily for 7 days.  traMADol 50 MG tablet Commonly known as: ULTRAM Take 1 tablet (50 mg total) by mouth every 6 (six) hours as needed for moderate pain (pain score 4-6) or severe pain (pain score 7-10).        Follow-up Information     Joen Laura, MD. Schedule an appointment as soon as possible for a visit in 1 week(s).   Specialty: Orthopedic Surgery Contact information: [address] [place] [address] [phone]                Allergies  Allergen Reactions   Amoxicillin-Pot Clavulanate Nausea Only and Other (See Comments)    Did it involve swelling of the face/tongue/throat, SOB, or low BP? No  Did it involve sudden or severe rash/hives, skin peeling, or any reaction on the inside of your mouth or nose? No  Did you need to seek medical attention at a hospital or doctor's office? No  When did it last happen? More than 10 years  If all above answers  are "NO", may proceed with cephalosporin use.  Did it involve swelling of the face/tongue/throat, SOB, or low BP? No Did it involve sudden or severe rash/hives, skin peeling, or any reaction on the inside of your mouth or nose? No Did you need to seek medical attention at a hospital or doctor's office? No When did it last happen? More than 10 years If all above answers are "NO", may proceed with cephalosporin use.   Celecoxib Nausea Only and Other (See Comments)    Upset stomach   Oxycodone Other (See Comments)    Confusion, makes him "crazy"  Confusion, makes him "crazy"    Confusion, makes him "crazy" Confusion, makes him "crazy"    Consultations: Orthopedics   Procedures/Studies: DG CHEST PORT 1 VIEW Result Date: 06/09/2023 CLINICAL DATA:  Shortness of breath EXAM: PORTABLE CHEST 1 VIEW COMPARISON:  05/28/2022 FINDINGS: Cardiac shadow is within normal limits. Aortic calcifications are seen. The lungs are well aerated bilaterally. Calcified granuloma is noted in the right base. No focal infiltrate is seen. Mild chronic scarring is noted in the right costophrenic angle. Bilateral shoulder replacements are seen. No acute bony abnormality is noted. IMPRESSION: Chronic changes without acute abnormality. Electronically Signed   By: Alcide Clever M.D.   On: 06/09/2023 20:40   DG Pelvis Portable Result Date: 06/09/2023 CLINICAL DATA:  Elective surgery, postreduction. EXAM: PORTABLE PELVIS 1-2 VIEWS COMPARISON:  Radiograph yesterday FINDINGS: Two fluoroscopic spot views of the pelvis submitted from the operating room. The previous left hip arthroplasty dislocation has been reduced. Alignment is anatomic. No fracture is seen. IMPRESSION: Reduction of left arthroplasty dislocation.  No fracture is seen. Electronically Signed   By: Narda Rutherford M.D.   On: 06/09/2023 16:12   DG Hip Unilat W or Wo Pelvis 2-3 Views Left Result Date: 06/08/2023 CLINICAL DATA:  Left hip pain after fall. EXAM: DG HIP (WITH  OR WITHOUT PELVIS) 2-3V LEFT COMPARISON:  Pelvic radiographs dated Aug 08, 2019. FINDINGS: The femoral component of the left hip prosthesis is dislocated posterosuperiorly. No periprosthetic fracture identified. Intact right hip arthroplasty. Lower lumbar fusion hardware in place. Sacroiliac joints and pubic symphysis appear anatomically aligned. IMPRESSION: Posterosuperior dislocation of the femoral component of the left hip prosthesis. No acute fracture identified. Electronically Signed   By: Hart Robinsons M.D.   On: 06/08/2023 15:13   CT Cervical Spine Wo Contrast Result Date: 05/30/2023 CLINICAL DATA:  Neck trauma EXAM: CT CERVICAL SPINE WITHOUT CONTRAST  TECHNIQUE: Multidetector CT imaging of the cervical spine was performed without intravenous contrast. Multiplanar CT image reconstructions were also generated. RADIATION DOSE REDUCTION: This exam was performed according to the departmental dose-optimization program which includes automated exposure control, adjustment of the mA and/or kV according to patient size and/or use of iterative reconstruction technique. COMPARISON:  Cervical spine CT 12/30/2022. FINDINGS: Alignment: There is 2 mm of anterolisthesis at C5-C6 which is favored is degenerative. Alignment is otherwise anatomic. Skull base and vertebrae: The bones are osteopenic. No acute fracture identified. Soft tissues and spinal canal: No prevertebral fluid or swelling. No visible canal hematoma. Disc levels: There severe disc space narrowing and endplate osteophyte formation with sclerosis at C5-C6 and C6-C7. There is moderate disc space narrowing at C3-C4. Degenerative changes also affect the anterior arch of C1 in the dens. This is similar to the prior study. There is no severe central canal or neural foraminal stenosis at any level. Mild multilevel neural foraminal and central canal stenosis appear stable. Upper chest: There are emphysematous changes and scarring in the lung apices. Other: None.  IMPRESSION: 1. No acute fracture or traumatic subluxation of the cervical spine. 2. Multilevel degenerative changes of the cervical spine, similar to the prior study. Electronically Signed   By: Darliss Cheney M.D.   On: 05/30/2023 17:33   CT Head Wo Contrast Result Date: 05/30/2023 CLINICAL DATA:  Head trauma, moderate-severe EXAM: CT HEAD WITHOUT CONTRAST TECHNIQUE: Contiguous axial images were obtained from the base of the skull through the vertex without intravenous contrast. RADIATION DOSE REDUCTION: This exam was performed according to the departmental dose-optimization program which includes automated exposure control, adjustment of the mA and/or kV according to patient size and/or use of iterative reconstruction technique. COMPARISON:  12/30/2022 FINDINGS: Brain: No evidence of acute infarction, hemorrhage, hydrocephalus, extra-axial collection or mass lesion/mass effect. Cavum septum pellucidum. Scattered low-density changes within the periventricular and subcortical white matter most compatible with chronic microvascular ischemic change. Mild diffuse cerebral volume loss. Vascular: Atherosclerotic calcifications involving the large vessels of the skull base. No unexpected hyperdense vessel. Skull: Normal. Negative for fracture or focal lesion. Sinuses/Orbits: Partial opacification of the left sphenoid sinus. Otherwise clear. Other: Right parietooccipital scalp swelling. IMPRESSION: 1. No acute intracranial abnormality. 2. Right parietooccipital scalp swelling. No underlying calvarial fracture. 3. Chronic microvascular ischemic change and cerebral volume loss. Electronically Signed   By: Duanne Guess D.O.   On: 05/30/2023 16:21      Subjective: Patient seen and examined at bedside today.  He looks comfortable.  Lying in bed.  Hemodynamically stable.  No bowel movement yet but denies any abdominal pain, nausea or vomiting.  Abdomen is soft and nondistended.  We ordered Dulcolax suppository.  He  thinks that he may be able to go home today.  He was waiting for physical therapy evaluation.  Discharge Exam: Vitals:   06/11/23 2219 06/12/23 0540  BP: 110/64 128/74  Pulse: 74 71  Resp: 16 16  Temp: 98.1 F (36.7 C) (!) 97.5 F (36.4 C)  SpO2: 98% 93%   Vitals:   06/11/23 0648 06/11/23 1330 06/11/23 2219 06/12/23 0540  BP: 137/80 117/63 110/64 128/74  Pulse: 72 73 74 71  Resp: 16 18 16 16   Temp: (!) 97.5 F (36.4 C) 97.6 F (36.4 C) 98.1 F (36.7 C) (!) 97.5 F (36.4 C)  TempSrc:   Oral Oral  SpO2: 96% 96% 98% 93%  Weight:      Height:  General: Pt is alert, awake, not in acute distress Cardiovascular: RRR, S1/S2 +, no rubs, no gallops Respiratory: CTA bilaterally, no wheezing, no rhonchi Abdominal: Soft, NT, ND, bowel sounds + Extremities: no edema, no cyanosis, left knee immobilizer    The results of significant diagnostics from this hospitalization (including imaging, microbiology, ancillary and laboratory) are listed below for reference.     Microbiology: No results found for this or any previous visit (from the past 240 hours).   Labs: BNP (last 3 results) Recent Labs    06/09/23 1637  BNP 161.3*   Basic Metabolic Panel: Recent Labs  Lab 06/10/23 0328 06/11/23 0320  NA 133* 134*  K 4.2 4.4  CL 100 101  CO2 24 24  GLUCOSE 118* 85  BUN 24* 28*  CREATININE 1.75* 1.63*  CALCIUM 8.5* 8.6*   Liver Function Tests: No results for input(s): "AST", "ALT", "ALKPHOS", "BILITOT", "PROT", "ALBUMIN" in the last 168 hours. No results for input(s): "LIPASE", "AMYLASE" in the last 168 hours. No results for input(s): "AMMONIA" in the last 168 hours. CBC: Recent Labs  Lab 06/10/23 0328 06/11/23 0320  WBC 6.2 5.7  HGB 10.3* 10.2*  HCT 32.8* 32.9*  MCV 101.5* 101.9*  PLT 293 289   Cardiac Enzymes: No results for input(s): "CKTOTAL", "CKMB", "CKMBINDEX", "TROPONINI" in the last 168 hours. BNP: Invalid input(s): "POCBNP" CBG: No results for  input(s): "GLUCAP" in the last 168 hours. D-Dimer No results for input(s): "DDIMER" in the last 72 hours. Hgb A1c No results for input(s): "HGBA1C" in the last 72 hours. Lipid Profile No results for input(s): "CHOL", "HDL", "LDLCALC", "TRIG", "CHOLHDL", "LDLDIRECT" in the last 72 hours. Thyroid function studies No results for input(s): "TSH", "T4TOTAL", "T3FREE", "THYROIDAB" in the last 72 hours.  Invalid input(s): "FREET3" Anemia work up No results for input(s): "VITAMINB12", "FOLATE", "FERRITIN", "TIBC", "IRON", "RETICCTPCT" in the last 72 hours. Urinalysis    Component Value Date/Time   COLORURINE YELLOW (A) 05/28/2022 1518   APPEARANCEUR HAZY (A) 05/28/2022 1518   APPEARANCEUR Clear 09/19/2019 1122   LABSPEC 1.018 05/28/2022 1518   PHURINE 5.0 05/28/2022 1518   GLUCOSEU NEGATIVE 05/28/2022 1518   HGBUR MODERATE (A) 05/28/2022 1518   BILIRUBINUR NEGATIVE 05/28/2022 1518   BILIRUBINUR Negative 09/19/2019 1122   KETONESUR NEGATIVE 05/28/2022 1518   PROTEINUR 30 (A) 05/28/2022 1518   NITRITE NEGATIVE 05/28/2022 1518   LEUKOCYTESUR NEGATIVE 05/28/2022 1518   Sepsis Labs Recent Labs  Lab 06/10/23 0328 06/11/23 0320  WBC 6.2 5.7   Microbiology No results found for this or any previous visit (from the past 240 hours).  Please note: You were cared for by a hospitalist during your hospital stay. Once you are discharged, your primary care physician will handle any further medical issues. Please note that NO REFILLS for any discharge medications will be authorized once you are discharged, as it is imperative that you return to your primary care physician (or establish a relationship with a primary care physician if you do not have one) for your post hospital discharge needs so that they can reassess your need for medications and monitor your lab values.    Time coordinating discharge: 40 minutes  SIGNED:   Burnadette Pop, MD  [facility] 06/12/2023, 1:11 PM Pager  [phone]  If 7PM-7AM, please contact night-coverage [email or web address] Password [email or web address]

## 2023-06-12 NOTE — Progress Notes (Signed)
 Physical Therapy Treatment Patient Details Name: Gavin Maxwell MRN: [id number] DOB: 13-Jan-1946 Today's Date: 06/12/2023   History of Present Illness Pt is 78 yo male admitted on 06/09/23 after posterior dislocation of L hip.  Pt underwent closed reduction in OR on 06/09/23.  Pt with other hx including HTN, CKD, COPD, non-Hodgkin lymphoma in remession, afib, R rTSA, Multiple L shoulder surgery with ongoing deficit, Thoracic laminectomy for spinal stimulator, R THA 2021, L THA 1998, R TKA 2016, L3/4 fusion    PT Comments  POD # 3 Pt OOB in recliner after completion OT session.  Spouse was re educated on the 2 different braces.  "Immobilizer for in bed and Hip brace for OOB/walking".  Assisted with amb in hallway and practiced one step.  Pt was able to amb a functional distance at Supervision level.  He does fatigue due to deconditioning.  Educated on importance of several "little" household walks once home.  Practiced one step Pt has to enter his home.  VC's to "lock knees" for increased support and use of safety belt for Spouse.  She stated, she WILL have extra male help to get Pt into house today.  Amb back to room and positioned in recliner.   Addressed all mobility questions, discussed appropriate activity, educated on use of ICE.  Pt ready for D/C to home.    If plan is discharge home, recommend the following: A lot of help with walking and/or transfers;A little help with bathing/dressing/bathroom;Assistance with cooking/housework;Help with stairs or ramp for entrance   Can travel by private vehicle        Equipment Recommendations  None recommended by PT    Recommendations for Other Services       Precautions / Restrictions Precautions Precautions: Fall;Posterior Hip Precaution/Restrictions Comments: THP and "bad" L UE limited shoulder ROM Required Braces or Orthoses: Other Brace Other Brace: Hip Abduction orthosis WITH MOBILIZING OOB and KI when in BED Restrictions Weight Bearing  Restrictions Per Provider Order: No LLE Weight Bearing Per Provider Order: Weight bearing as tolerated     Mobility  Bed Mobility               General bed mobility comments: Pt OOB in recliner    Transfers Overall transfer level: Needs assistance Equipment used: Rolling walker (2 wheels) Transfers: Sit to/from Stand, Bed to chair/wheelchair/BSC Sit to Stand: Supervision, Contact guard assist           General transfer comment: pt. able to transtion from sit/stand, stand/sit without physical assistance.  cues for hand placement and sliding leg forward prior to sitting down.  Spouse educated on using safety belt if Pt needs "a little" assist.    Ambulation/Gait Ambulation/Gait assistance: Supervision, Contact guard assist Gait Distance (Feet): 40 Feet (20 feet x 2) Assistive device: Rolling walker (2 wheels) Gait Pattern/deviations: Trunk flexed Gait velocity: decreased     General Gait Details: Tolerated a functional distance 20 feet x 2.  Does fatigue easily.  Deconditioned.  Educated on importance of freq amb at home.   Stairs Stairs: Yes Stairs assistance: Contact guard assist, Min assist Stair Management: No rails, Step to pattern, Forwards Number of Stairs: 1 General stair comments: practiced ONE step with walker no rail with VC's on proper walker to self placeemnt and "lock knees" for support.   Wheelchair Mobility     Tilt Bed    Modified Rankin (Stroke Patients Only)       Balance  Communication Communication Communication: No apparent difficulties  Cognition Arousal: Alert Behavior During Therapy: WFL for tasks assessed/performed   PT - Cognitive impairments: No apparent impairments                       PT - Cognition Comments: AxO x 3 pleasant but a little "foggy" on new information and required repeat instructions esp with THP during sit to stand and stand to sit.   Spouse present also Educated. Following commands: Intact      Cueing Cueing Techniques: Verbal cues, Gestural cues, Tactile cues, Visual cues  Exercises      General Comments        Pertinent Vitals/Pain Pain Assessment Pain Assessment: Faces Faces Pain Scale: Hurts a little bit Pain Location: L hip Pain Descriptors / Indicators: Tender, Operative site guarding, Discomfort Pain Intervention(s): Monitored during session, Premedicated before session, Repositioned, Ice applied    Home Living                          Prior Function            PT Goals (current goals can now be found in the care plan section) Progress towards PT goals: Progressing toward goals    Frequency    Min 3X/week      PT Plan      Co-evaluation              AM-PAC PT "6 Clicks" Mobility   Outcome Measure  Help needed turning from your back to your side while in a flat bed without using bedrails?: A Little Help needed moving from lying on your back to sitting on the side of a flat bed without using bedrails?: A Little Help needed moving to and from a bed to a chair (including a wheelchair)?: A Little Help needed standing up from a chair using your arms (e.g., wheelchair or bedside chair)?: A Little Help needed to walk in hospital room?: A Little Help needed climbing 3-5 steps with a railing? : A Little 6 Click Score: 18    End of Session Equipment Utilized During Treatment: Gait belt Activity Tolerance: Patient tolerated treatment well Patient left: in chair;with call bell/phone within reach;with family/visitor present Nurse Communication: Mobility status PT Visit Diagnosis: Other abnormalities of gait and mobility (R26.89);Muscle weakness (generalized) (M62.81)     Time: 1000-1025 PT Time Calculation (min) (ACUTE ONLY): 25 min  Charges:    $Gait Training: 8-22 mins $Therapeutic Activity: 8-22 mins PT General Charges $$ ACUTE PT VISIT: 1 Visit                      Felecia Shelling  PTA Acute  Rehabilitation Services Office M-F          [phone]

## 2023-06-12 NOTE — Progress Notes (Signed)
 Occupational Therapy Treatment Patient Details Name: Gavin Maxwell MRN: [id number] DOB: Aug 09, 1945 Today's Date: 06/12/2023   History of present illness Pt is 78 yo male admitted on 06/09/23 after posterior dislocation of L hip.  Pt underwent closed reduction in OR on 06/09/23.  Pt with other hx including HTN, CKD, COPD, non-Hodgkin lymphoma in remession, afib, R rTSA, Multiple L shoulder surgery with ongoing deficit, Thoracic laminectomy for spinal stimulator, R THA 2021, L THA 1998, R TKA 2016, L3/4 fusion   OT comments  Pt. Seen for skilled OT treatment with wife present.  Pt. And spouse able to demonstrate good tech. For don/doff both braces and recall of when to wear each one.  Both able to state precautions and pt. With good adherence during session.  Mod a for bed mobility but pt. Reports he will sleep in recliner at home.  Able to ambulate to/from b.room for toileting with CGA.  Review of uses of 3n1 for home.  Pt. And spouse verbalize understanding.  Plan for sponge bathe initially until transfer tech. And strategies in place.  Cont. With acute OT poc.        If plan is discharge home, recommend the following:  A little help with walking and/or transfers;A lot of help with bathing/dressing/bathroom;Assistance with cooking/housework;Assist for transportation;Help with stairs or ramp for entrance   Equipment Recommendations  None recommended by OT    Recommendations for Other Services      Precautions / Restrictions Precautions Precautions: Fall;Posterior Hip Recall of Precautions/Restrictions: Impaired Precaution/Restrictions Comments: THP and "bad" L UE limited shoulder ROM Other Brace: Hip Abduction orthosis WITH MOBILIZING OOB and KI when in BED Restrictions LLE Weight Bearing Per Provider Order: Weight bearing as tolerated       Mobility Bed Mobility Overal bed mobility: Needs Assistance Bed Mobility: Supine to Sit     Supine to sit: Mod assist, HOB elevated, Used rails      General bed mobility comments: mod a for guiding bles towards eob and use of hob max elevation and use of bed rail.  pt. reports he will be sleeping in recliner to maintian precautions and ease for in/out. likes the leg lifter that was provided for him    Transfers Overall transfer level: Needs assistance Equipment used: Rolling walker (2 wheels) Transfers: Sit to/from Stand, Bed to chair/wheelchair/BSC Sit to Stand: Contact guard assist     Step pivot transfers: Contact guard assist     General transfer comment: pt. able to transtion from sit/stand, stand/sit without physical assistance.  cues for hand placement and sliding leg forward prior to sitting down.     Balance                                           ADL either performed or assessed with clinical judgement   ADL Overall ADL's : Needs assistance/impaired                     Lower Body Dressing: With caregiver independent assisting;Cueing for sequencing;Sitting/lateral leans;Sit to/from stand Lower Body Dressing Details (indicate cue type and reason): pt. and spouse able to demonstrate good tech. for don/doff abd. and KI. Toilet Transfer: Contact guard assist;Ambulation;Rolling walker (2 wheels);BSC/3in1;Regular Teacher, adult education Details (indicate cue type and reason): 3n1 over commode for raising height of toilet and providing arm rests for reaching back before sitting down Toileting-  Clothing Manipulation and Hygiene: Contact guard assist;Sit to/from stand       Functional mobility during ADLs: Contact guard assist;Rolling walker (2 wheels) General ADL Comments: education on uses of 3n1 for toileting and during bathing.  reviewed rec. not to peform shower until transfer and tech. reviewed with HH therapies. pt and spouse verbalized understanding and agree to sponge bathe initially until "cleared" by [facility] therapies for safety.    Extremity/Trunk Assessment              Vision        Restaurant manager, fast food Communication: No apparent difficulties   Cognition Arousal: Alert Behavior During Therapy: WFL for tasks assessed/performed Cognition: Cognition impaired       Memory impairment (select all impairments): Short-term memory Attention impairment (select first level of impairment): Selective attention Executive functioning impairment (select all impairments): Sequencing                   Following commands: Intact        Cueing   Cueing Techniques: Verbal cues, Gestural cues, Tactile cues, Visual cues  Exercises      Shoulder Instructions       General Comments      Pertinent Vitals/ Pain       Pain Assessment Pain Assessment: No/denies pain  Home Living                                          Prior Functioning/Environment              Frequency  Min 2X/week        Progress Toward Goals  OT Goals(current goals can now be found in the care plan section)  Progress towards OT goals: Progressing toward goals     Plan      Co-evaluation                 AM-PAC OT "6 Clicks" Daily Activity     Outcome Measure   Help from another person eating meals?: A Little Help from another person taking care of personal grooming?: A Little Help from another person toileting, which includes using toliet, bedpan, or urinal?: A Little Help from another person bathing (including washing, rinsing, drying)?: A Lot Help from another person to put on and taking off regular upper body clothing?: A Little Help from another person to put on and taking off regular lower body clothing?: A Lot 6 Click Score: 16    End of Session Equipment Utilized During Treatment: Rolling walker (2 wheels)  OT Visit Diagnosis: Unsteadiness on feet (R26.81);Other abnormalities of gait and mobility (R26.89);Pain;Muscle weakness (generalized) (M62.81)   Activity Tolerance Patient tolerated treatment  well   Patient Left in chair;with call bell/phone within reach;with chair alarm set   Nurse Communication          Time: [phone] OT Time Calculation (min): 29 min  Charges: OT General Charges $OT Visit: 1 Visit OT Treatments $Self Care/Home Management : 23-37 mins  Boneta Lucks, COTA/L Acute Rehabilitation [phone]   Alessandra Bevels Lorraine-COTA/L 06/12/2023, 10:14 AM

## 2023-06-15 NOTE — ED Provider Notes (Signed)
 Emergency Department procedure note  .Sedation  Date/Time: 06/15/2023 10:12 AM  Performed by: Merwyn Katos, MD Authorized by: Merwyn Katos, MD   Consent:    Consent obtained:  Written   Consent given by:  Patient and spouse   Risks discussed:  Allergic reaction, prolonged hypoxia resulting in organ damage, dysrhythmia, prolonged sedation necessitating reversal, inadequate sedation, respiratory compromise necessitating ventilatory assistance and intubation, nausea and vomiting   Alternatives discussed:  Analgesia without sedation, anxiolysis and regional anesthesia Universal protocol:    Immediately prior to procedure, a time out was called: yes   Indications:    Procedure necessitating sedation performed by:  Physician performing sedation Pre-sedation assessment:    Time since last food or drink:  Na   NPO status caution: urgency dictates proceeding with non-ideal NPO status     ASA classification: class 2 - patient with mild systemic disease     Mouth opening:  2 finger widths   Thyromental distance:  3 finger widths   Mallampati score:  II - soft palate, uvula, fauces visible   Neck mobility: normal     Pre-sedation assessments completed and reviewed: airway patency, cardiovascular function, hydration status, mental status, nausea/vomiting, pain level, respiratory function and temperature   A pre-sedation assessment was completed prior to the start of the procedure Immediate pre-procedure details:    Reassessment: Patient reassessed immediately prior to procedure     Reviewed: vital signs, relevant labs/tests and NPO status     Verified: bag valve mask available, emergency equipment available, intubation equipment available, IV patency confirmed, oxygen available, reversal medications available and suction available   Procedure details (see MAR for exact dosages):    Preoxygenation:  Nasal cannula   Sedation:  Propofol and ketamine   Intended level of sedation: deep    Intra-procedure monitoring:  Blood pressure monitoring, continuous capnometry, frequent LOC assessments, frequent vital sign checks, continuous pulse oximetry and cardiac monitor   Intra-procedure events: none     Total Provider sedation time (minutes):  19 Post-procedure details:   A post-sedation assessment was completed following the completion of the procedure.   Attendance: Constant attendance by certified staff until patient recovered     Recovery: Patient returned to pre-procedure baseline     Post-sedation assessments completed and reviewed: airway patency, cardiovascular function, hydration status, mental status, nausea/vomiting, pain level, respiratory function and temperature     Patient is stable for discharge or admission: yes     Procedure completion:  Tolerated well, no immediate complications     Merwyn Katos, MD 06/15/23 1016

## 2023-06-18 ENCOUNTER — Telehealth: Payer: Self-pay | Admitting: Cardiovascular Disease

## 2023-06-18 DIAGNOSIS — M25552 Pain in left hip: Secondary | ICD-10-CM | POA: Diagnosis not present

## 2023-06-18 DIAGNOSIS — S73005D Unspecified dislocation of left hip, subsequent encounter: Secondary | ICD-10-CM | POA: Diagnosis not present

## 2023-06-18 NOTE — Telephone Encounter (Signed)
 Left a message for the patient to call back.

## 2023-06-18 NOTE — Telephone Encounter (Signed)
 The patient called to report experiencing bilateral foot swelling for the past couple of days. He stated that his feet are so swollen that it has been difficult to find a pair of shoes to wear. The patient also noted shortness of breath with exertion and chest congestion. He stated that he takes Lasix once weekly on Saturdays and was advised that he could take an extra 20 mg for three days to help with swelling. The patient reported that he has been taking the extra dose for the last three days, and while his symptoms have improved, the swelling continues. An appointment is scheduled for tomorrow, 06/19/23, for further evaluation.

## 2023-06-18 NOTE — Telephone Encounter (Signed)
 Pt c/o swelling: STAT is pt has developed SOB within 24 hours  How much weight have you gained and in what time span? no  If swelling, where is the swelling located? feet  Are you currently taking a fluid pill? yes  Are you currently SOB? yes  Do you have a log of your daily weights (if so, list)?    Have you gained 3 pounds in a day or 5 pounds in a week? no  Have you traveled recently?  no  Patient states that the swelling hasn't really gone done with medication. Calling to see what else can be done.

## 2023-06-19 ENCOUNTER — Encounter: Payer: Self-pay | Admitting: Medical

## 2023-06-19 ENCOUNTER — Ambulatory Visit: Attending: Medical | Admitting: Medical

## 2023-06-19 VITALS — BP 126/68 | HR 87 | Ht 71.0 in | Wt 225.0 lb

## 2023-06-19 DIAGNOSIS — I480 Paroxysmal atrial fibrillation: Secondary | ICD-10-CM | POA: Diagnosis not present

## 2023-06-19 DIAGNOSIS — I5033 Acute on chronic diastolic (congestive) heart failure: Secondary | ICD-10-CM | POA: Diagnosis not present

## 2023-06-19 DIAGNOSIS — E782 Mixed hyperlipidemia: Secondary | ICD-10-CM

## 2023-06-19 DIAGNOSIS — N1832 Chronic kidney disease, stage 3b: Secondary | ICD-10-CM

## 2023-06-19 DIAGNOSIS — I2510 Atherosclerotic heart disease of native coronary artery without angina pectoris: Secondary | ICD-10-CM

## 2023-06-19 DIAGNOSIS — I10 Essential (primary) hypertension: Secondary | ICD-10-CM | POA: Diagnosis not present

## 2023-06-19 NOTE — Progress Notes (Signed)
 Cardiology Office Note:  .   Date:  06/22/2023  ID:  Gavin Maxwell, DOB 29-Jul-1945, MRN [id number] PCP: Marguarite Arbour, MD  [facility] Providers Cardiologist:  Julien Nordmann, MD {   History of Present Illness: .   Gavin Maxwell is a 78 y.o. 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  Patient was initially evaluated by heart care in 2020.  He was referred to EP for SVT.  He underwent ablation March 08, 2019.  Patient was diagnosed with new onset A-fib with RVR in February 2024.  Echo at that time showed EF of 60 to 65%, moderate concentric LVH.  TEE showed EF of 45 to 50%, mild MR.  Troponin peaked at greater than 2000.  He denied any chest pain.  He was successfully cardioverted to normal sinus rhythm.  He was started on amiodarone.  Myoview Lexiscan was low risk.  Echo showed EF of 45 to 50%.  The patient was last seen November 2024 for preop evaluation.  No further cardiac testing was required.  The patient was hospitalized 3/4-3/7 with left hip dislocation. He underwent reduction.   Today, the patient reports he is still recovering from his left hip reduction. He reported chest congestion and was given abx. He reports lower leg edema and shortness of breath since discharge. He took lasix twice this week. He denies chest pain. He is in a wheelchair, but this is just since the hip injury. He has been using a walker in the house.   Studies Reviewed: Marland Kitchen   EKG Interpretation Date/Time:  Friday June 19 2023 14:38:36 EDT Ventricular Rate:  84 PR Interval:    QRS Duration:  110 QT Interval:  396 QTC Calculation: 467 R Axis:   -24  Text Interpretation: Normal sinus rhythm , 1st degree AV block, PACs Minimal voltage criteria for LVH, may be  normal variant ( R in aVL ) Possible Anterolateral infarct , age undetermined When compared with ECG of Confirmed by Fransico Michael, Jamian Andujo ([id number]) on 06/19/2023 8:25:02 PM    Myoview Eugenie Birks 07/2022 Narrative & Impression      Normal pharmacologic myocardial perfusion stress test without evidence of significant ischemia or scar.   Left ventricular systolic function is normal (LVEF > 55%).   Three-vessel coronary artery calcification noted on the attenuation correction CT.  There is also aortic atherosclerosis.   This is a low risk study.   Echo TEE 05/2022 1. Left ventricular ejection fraction, by estimation, is 45 to 50%. The  left ventricle has mildly decreased function. The left ventricle  demonstrates global hypokinesis. Left ventricular diastolic parameters are  indeterminate.   2. Right ventricular systolic function is normal. The right ventricular  size is normal.   3. Left atrial size was moderately dilated. No left atrial/left atrial  appendage thrombus was detected.   4. The mitral valve is normal in structure. Mild mitral valve  regurgitation. No evidence of mitral stenosis.   5. The aortic valve is normal in structure. Aortic valve regurgitation is  not visualized. Aortic valve sclerosis is present, with no evidence of  aortic valve stenosis.   6. There is Moderate (Grade III) atheroma plaque involving the aortic  arch and descending aorta.   7. The inferior vena cava is normal in size with greater than 50%  respiratory variability, suggesting right atrial pressure of 3 mmHg.   8. Agitated saline contrast bubble study was negative, with no evidence  of any interatrial shunt.   Echo 05/2022 1. Left ventricular ejection fraction, by estimation, is 60 to 65%. The  left ventricle has normal function. The left ventricle has no regional  wall motion abnormalities. There is moderate concentric left ventricular  hypertrophy. Left ventricular  diastolic parameters are indeterminate.    2. Right ventricular systolic function is normal. The right ventricular  size is normal.   3. The mitral valve is normal in structure. Mild mitral valve  regurgitation. No evidence of mitral stenosis.   4. The aortic valve is normal in structure. Aortic valve regurgitation is  not visualized. Aortic valve sclerosis/calcification is present, without  any evidence of aortic stenosis.   5. The inferior vena cava is normal in size with greater than 50%  respiratory variability, suggesting right atrial pressure of 3 mmHg.       Physical Exam:   VS:  BP 126/68   Pulse 87   Ht 5\' 11"  (1.803 m)   Wt 225 lb (102.1 kg)   SpO2 94%   BMI 31.38 kg/m    Wt Readings from Last 3 Encounters:  06/19/23 225 lb (102.1 kg)  06/09/23 225 lb (102.1 kg)  06/08/23 225 lb (102.1 kg)    GEN: Well nourished, well developed in no acute distress NECK: No JVD; No carotid bruits CARDIAC: RRR, no murmurs, rubs, gallops RESPIRATORY:  Clear to auscultation without rales, wheezing or rhonchi  ABDOMEN: Soft, non-tender, non-distended EXTREMITIES:  1+ lower extremity edema; No deformity   ASSESSMENT AND PLAN: .    Acute on chronic HFpEF He took lasix 3 times this week. Increase lasix to 20mg  BID x 3 days. Then back down to 20mg  daily. BMET in 1 week. Repeat echo. Follow-up in 3-4 weeks.   Paroxysmal Afib He remains in NSR. Continue Eliquis 2.5mg  BID for stroke ppx. Continue Toprol 25mg  daily and amiodarone 200mg  daily  HTN BP good. Continue Toprol 25mg  daily.   HLD LDL 51. Continue Lipitor 80mg  daily.   CKD Scr stable at 1.7  Dispo: Follow-up in   Signed, Zarriah Starkel David Stall, PA-C

## 2023-06-19 NOTE — Patient Instructions (Signed)
 Medication Instructions:  Take Lasix 20 mg TWICE DAILY for 3 DAYS, then decrease back to 20 mg daily.   *If you need a refill on your cardiac medications before your next appointment, please call your pharmacy*   Lab Work: Your provider would like for you to return in 1 week to have the following labs drawn: BMET.   Please go to [facility] [address] ([facility] [address], [place] [address] You do not need an appointment.  They are open from 8 am- 4:30 pm.  Lunch from 1:00 pm- 2:00 pm You do not need to be fasting.   You may also go to one of the following [facility]:  [address] [place], [place] [address] Phone: [phone] Lab hours: Mon-Fri 8 am- 5 pm    Lunch 12 pm- 1 pm  [address] [place],  [place]  [address]  [place] Phone: [phone] Lab hours: 7 am- 4 pm Lunch 12 pm-1 pm   [address] [place],  [place]  [address]  [place] Phone: [phone] Lab hours: Mon-Fri 8 am- 5 pm    Lunch 12 pm- 1 pm  If you have labs (blood work) drawn today and your tests are completely normal, you will receive your results only by: MyChart Message (if you have MyChart) OR A paper copy in the mail If you have any lab test that is abnormal or we need to change your treatment, we will call you to review the results.   Testing/Procedures:  Your physician has requested that you have an echocardiogram. Echocardiography is a painless test that uses sound waves to create images of your heart. It provides your doctor with information about the size and shape of your heart and how well your heart's chambers and valves are working.   You may receive an ultrasound enhancing agent through an IV if needed to better visualize your heart during the echo. This procedure takes approximately one hour.  There are no restrictions for this procedure.  This will take place at [address] [facility]) [address], [place] [address]  Please note: We ask at that you not  bring children with you during ultrasound (echo/ vascular) testing. Due to room size and safety concerns, children are not allowed in the ultrasound rooms during exams. Our front office staff cannot provide observation of children in our lobby area while testing is being conducted. An adult accompanying a patient to their appointment will only be allowed in the ultrasound room at the discretion of the ultrasound technician under special circumstances. We apologize for any inconvenience.    Follow-Up: At [facility], you and your health needs are our priority.  As part of our continuing mission to provide you with exceptional heart care, we have created designated Provider Care Teams.  These Care Teams include your primary Cardiologist (physician) and Advanced Practice Providers (APPs -  Physician Assistants and Nurse Practitioners) who all work together to provide you with the care you need, when you need it.  We recommend signing up for the patient portal called "MyChart".  Sign up information is provided on this After Visit Summary.  MyChart is used to connect with patients for Virtual Visits (Telemedicine).  Patients are able to view lab/test results, encounter notes, upcoming appointments, etc.  Non-urgent messages can be sent to your provider as well.   To learn more about what you can do with MyChart, go to [email or web address].    Your next appointment:   3-4  week(s)  Provider:   You may see Julien Nordmann, MD or one of the following Advanced Practice Providers on your designated Care Team:   Nicolasa Ducking, NP Eula Listen, PA-C Cadence Fransico Michael, PA-C Charlsie Quest, NP Carlos Levering, NP

## 2023-06-22 DIAGNOSIS — C859A Non-Hodgkin lymphoma, unspecified, in remission: Secondary | ICD-10-CM | POA: Diagnosis not present

## 2023-06-22 DIAGNOSIS — D630 Anemia in neoplastic disease: Secondary | ICD-10-CM | POA: Diagnosis not present

## 2023-06-22 DIAGNOSIS — J449 Chronic obstructive pulmonary disease, unspecified: Secondary | ICD-10-CM | POA: Diagnosis not present

## 2023-06-22 DIAGNOSIS — I480 Paroxysmal atrial fibrillation: Secondary | ICD-10-CM | POA: Diagnosis not present

## 2023-06-22 DIAGNOSIS — N1832 Chronic kidney disease, stage 3b: Secondary | ICD-10-CM | POA: Diagnosis not present

## 2023-06-22 DIAGNOSIS — D631 Anemia in chronic kidney disease: Secondary | ICD-10-CM | POA: Diagnosis not present

## 2023-06-22 DIAGNOSIS — F32A Depression, unspecified: Secondary | ICD-10-CM | POA: Diagnosis not present

## 2023-06-22 DIAGNOSIS — I129 Hypertensive chronic kidney disease with stage 1 through stage 4 chronic kidney disease, or unspecified chronic kidney disease: Secondary | ICD-10-CM | POA: Diagnosis not present

## 2023-06-22 DIAGNOSIS — E785 Hyperlipidemia, unspecified: Secondary | ICD-10-CM | POA: Diagnosis not present

## 2023-06-22 DIAGNOSIS — T84021D Dislocation of internal left hip prosthesis, subsequent encounter: Secondary | ICD-10-CM | POA: Diagnosis not present

## 2023-06-26 ENCOUNTER — Other Ambulatory Visit: Payer: Self-pay

## 2023-06-26 DIAGNOSIS — I5033 Acute on chronic diastolic (congestive) heart failure: Secondary | ICD-10-CM

## 2023-06-27 ENCOUNTER — Other Ambulatory Visit: Payer: Self-pay | Admitting: Cardiovascular Disease

## 2023-06-27 LAB — BASIC METABOLIC PANEL
BUN/Creatinine Ratio: 20 (ref 10–24)
BUN: 53 mg/dL — ABNORMAL HIGH (ref 8–27)
CO2: 27 mmol/L (ref 20–29)
Calcium: 9.2 mg/dL (ref 8.6–10.2)
Chloride: 98 mmol/L (ref 96–106)
Creatinine, Ser: 2.61 mg/dL — ABNORMAL HIGH (ref 0.76–1.27)
Glucose: 87 mg/dL (ref 70–99)
Potassium: 4.7 mmol/L (ref 3.5–5.2)
Sodium: 140 mmol/L (ref 134–144)
eGFR: 24 mL/min/{1.73_m2} — ABNORMAL LOW (ref >59)

## 2023-06-29 DIAGNOSIS — S73005D Unspecified dislocation of left hip, subsequent encounter: Secondary | ICD-10-CM | POA: Diagnosis not present

## 2023-07-03 DIAGNOSIS — M25552 Pain in left hip: Secondary | ICD-10-CM | POA: Diagnosis not present

## 2023-07-03 DIAGNOSIS — S73005D Unspecified dislocation of left hip, subsequent encounter: Secondary | ICD-10-CM | POA: Diagnosis not present

## 2023-07-03 DIAGNOSIS — Z96642 Presence of left artificial hip joint: Secondary | ICD-10-CM | POA: Diagnosis not present

## 2023-07-06 ENCOUNTER — Other Ambulatory Visit: Payer: Self-pay | Admitting: Emergency Medicine

## 2023-07-06 ENCOUNTER — Telehealth: Payer: Self-pay | Admitting: Cardiovascular Disease

## 2023-07-06 DIAGNOSIS — G4733 Obstructive sleep apnea (adult) (pediatric): Secondary | ICD-10-CM | POA: Diagnosis not present

## 2023-07-06 DIAGNOSIS — M7989 Other specified soft tissue disorders: Secondary | ICD-10-CM

## 2023-07-06 NOTE — Telephone Encounter (Signed)
 Pt returning nurse call in regards to results. Please advise

## 2023-07-06 NOTE — Telephone Encounter (Signed)
 See result note.

## 2023-07-08 DIAGNOSIS — I890 Lymphedema, not elsewhere classified: Secondary | ICD-10-CM | POA: Insufficient documentation

## 2023-07-08 NOTE — Progress Notes (Signed)
 MRN : [id number]  Gavin Maxwell is a 78 y.o. ([birth year]) male who presents with chief complaint of legs swell.  History of Present Illness:   Patient is seen for evaluation of leg swelling. The patient first noticed the swelling remotely but is now concerned because of a significant increase in the overall edema. The swelling isn't associated with significant pain.  There has been an increasing amount of  discoloration noted by the patient. The patient notes that in the morning the legs are improved but they steadily worsened throughout the course of the day. Elevation seems to make the swelling of the legs better, dependency makes them much worse.   There is no history of ulcerations associated with the swelling.   The patient denies any recent changes in their medications.  The patient has not been wearing graduated compression.  The patient has no had any past angiography, interventions or vascular surgery.  The patient denies a history of DVT or PE. There is no prior history of phlebitis. There is no history of primary lymphedema.  There is no history of radiation treatment to the groin or pelvis No history of malignancies. No history of trauma or groin or pelvic surgery. No history of foreign travel or parasitic infections area   No outpatient medications have been marked as taking for the 07/09/23 encounter (Appointment) with Gilda Crease, Latina Craver, MD.    Past Medical History:  Diagnosis Date   Ambulates with cane    straight 4 prong cane   Anxiety    Cancer (HCC)    non hodgins  lymphoma   2004 in remission  Left arm  wears a brace there is a bone broken   CKD (chronic kidney disease) stage 3, GFR 30-59 ml/min (HCC)    COPD (chronic obstructive pulmonary disease) (HCC)    Depression    Dysrhythmia    SVT   GERD (gastroesophageal reflux disease)    Hearing loss    wears hearing aids   History of hiatal hernia    Hypertension     Pneumonia    x 1   Primary localized osteoarthritis of knee 09/26/2014   Primary localized osteoarthrosis, shoulder region, rotator cuff arthropathy 10/04/2013   Sleep apnea    uses cpap nightly    Past Surgical History:  Procedure Laterality Date   CARDIAC CATHETERIZATION     20 yrs. ago   CARDIOVERSION N/A 06/03/2022   Procedure: CARDIOVERSION;  Surgeon: Antonieta Iba, MD;  Location: [facility];  Service: Cardiovascular;  Laterality: N/A;   CHOLECYSTECTOMY     EYE SURGERY Left    pt states he was "seeing double" and they did surgery to fix it   HIP CLOSED REDUCTION Left 06/09/2023   Procedure: CLOSED REDUCTION HIP, LEFT;  Surgeon: Sheral Apley, MD;  Location: [facility];  Service: Orthopedics;  Laterality: Left;   NASAL SINUS SURGERY     x2   REVERSE SHOULDER ARTHROPLASTY Right 10/04/2013   Procedure: REVERSE SHOULDER ARTHROPLASTY;  Surgeon: Eulas Post, MD;  Location: [facility];  Service: Orthopedics;  Laterality: Right;   SHOULDER ACROMIOPLASTY Left    x 5 shoulder surgeries   SVT ABLATION N/A 03/07/2019   Procedure: SVT ABLATION;  Surgeon: Regan Lemming, MD;  Location: [facility];  Service: Cardiovascular;  Laterality: N/A;  TEE WITHOUT CARDIOVERSION N/A 06/03/2022   Procedure: TRANSESOPHAGEAL ECHOCARDIOGRAM;  Surgeon: Antonieta Iba, MD;  Location: [facility];  Service: Cardiovascular;  Laterality: N/A;   THORACIC LAMINECTOMY FOR SPINAL CORD STIMULATOR N/A 02/19/2023   Procedure: THORACIC LAMINECTOMY AND PLACEMENT OF SPINAL CORD STIMULATOR AND BATTERY;  Surgeon: Estill Bamberg, MD;  Location: [facility];  Service: Orthopedics;  Laterality: N/A;   TOTAL HIP ARTHROPLASTY Right 07/26/2019   Procedure: TOTAL HIP ARTHROPLASTY;  Surgeon: Teryl Lucy, MD;  Location: [facility];  Service: Orthopedics;  Laterality: Right;   TOTAL HIP ARTHROPLASTY Left 1998   TOTAL KNEE ARTHROPLASTY Right 09/26/2014   Procedure: RIGHT TOTAL KNEE ARTHROPLASTY;  Surgeon: Teryl Lucy, MD;   Location: [facility];  Service: Orthopedics;  Laterality: Right;   TRANSFORAMINAL LUMBAR INTERBODY FUSION (TLIF) WITH PEDICLE SCREW FIXATION 2 LEVEL Right 06/05/2021   Procedure: RIGHT-SIDED LUMBAR 3- LUMBAR 4, LUMBAR 4- LUMBAR 5 TRANSFORAMINAL LUMBAR INTERBODY FUSION AND DECOMPRESSION WITH INSTRUMENTATION AND ALLOGRAFT;  Surgeon: Estill Bamberg, MD;  Location: [facility];  Service: Orthopedics;  Laterality: Right;   VIDEO ASSISTED THORACOSCOPY (VATS) W/TALC PLEUADESIS Right 08/18/2017   Procedure: VIDEO ASSISTED THORACOSCOPY (VATS)POSSIBLE  W/TALC PLEUADESIS.POSSIBLE BLEBECTOMY;  Surgeon: Hulda Marin, MD;  Location: [facility];  Service: Thoracic;  Laterality: Right;    Social History Social History   Tobacco Use   Smoking status: Former    Current packs/day: 0.00    Average packs/day: 2.0 packs/day for 20.0 years (40.0 ttl pk-yrs)    Types: Cigarettes    Start date: 45    Quit date: 31    Years since quitting: 35.2   Smokeless tobacco: Never   Tobacco comments:    quit 25 years ago  Vaping Use   Vaping status: Never Used  Substance Use Topics   Alcohol use: No   Drug use: No    Family History Family History  Problem Relation Age of Onset   Breast cancer Mother    Hypertension Mother    Rheum arthritis Father    Cancer Father    Lung cancer Brother    Stroke Paternal Grandfather     Allergies  Allergen Reactions   Amoxicillin-Pot Clavulanate Nausea Only and Other (See Comments)    Did it involve swelling of the face/tongue/throat, SOB, or low BP? No  Did it involve sudden or severe rash/hives, skin peeling, or any reaction on the inside of your mouth or nose? No  Did you need to seek medical attention at a hospital or doctor's office? No  When did it last happen? More than 10 years  If all above answers are "NO", may proceed with cephalosporin use.  Did it involve swelling of the face/tongue/throat, SOB, or low BP? No Did it involve sudden or severe rash/hives, skin  peeling, or any reaction on the inside of your mouth or nose? No Did you need to seek medical attention at a hospital or doctor's office? No When did it last happen? More than 10 years If all above answers are "NO", may proceed with cephalosporin use.   Celecoxib Nausea Only and Other (See Comments)    Upset stomach   Oxycodone Other (See Comments)    Confusion, makes him "crazy"  Confusion, makes him "crazy"    Confusion, makes him "crazy" Confusion, makes him "crazy"     REVIEW OF SYSTEMS (Negative unless checked)  Constitutional: [] Weight loss  [] Fever  [] Chills Cardiac: [] Chest pain   [] Chest pressure   [] Palpitations   [] Shortness of breath when laying  flat   [] Shortness of breath with exertion. Vascular:  [] Pain in legs with walking   [x] Pain in legs with standing  [] History of DVT   [] Phlebitis   [x] Swelling in legs   [] Varicose veins   [] Non-healing ulcers Pulmonary:   [] Uses home oxygen   [] Productive cough   [] Hemoptysis   [] Wheeze  [] COPD   [] Asthma Neurologic:  [] Dizziness   [] Seizures   [] History of stroke   [] History of TIA  [] Aphasia   [] Vissual changes   [] Weakness or numbness in arm   [] Weakness or numbness in leg Musculoskeletal:   [] Joint swelling   [] Joint pain   [] Low back pain Hematologic:  [] Easy bruising  [] Easy bleeding   [] Hypercoagulable state   [] Anemic Gastrointestinal:  [] Diarrhea   [] Vomiting  [] Gastroesophageal reflux/heartburn   [] Difficulty swallowing. Genitourinary:  [] Chronic kidney disease   [] Difficult urination  [] Frequent urination   [] Blood in urine Skin:  [] Rashes   [] Ulcers  Psychological:  [] History of anxiety   []  History of major depression.  Physical Examination  There were no vitals filed for this visit. There is no height or weight on file to calculate BMI. Gen: WD/WN, NAD Head: [place]/AT, No temporalis wasting.  Ear/Nose/Throat: Hearing grossly intact, nares w/o erythema or drainage, pinna without lesions Eyes: PER, EOMI, sclera  nonicteric.  Neck: Supple, no gross masses.  No JVD.  Pulmonary:  Good air movement, no audible wheezing, no use of accessory muscles.  Cardiac: RRR, precordium not hyperdynamic. Vascular:  scattered varicosities present bilaterally.  Moderate venous stasis changes to the legs bilaterally.  3-4+ soft pitting edema, CEAP C4sEpAsPr  Vessel Right Left  Radial Palpable Palpable  Gastrointestinal: soft, non-distended. No guarding/no peritoneal signs.  Musculoskeletal: M/S 5/5 throughout.  No deformity.  Neurologic: CN 2-12 intact. Pain and light touch intact in extremities.  Symmetrical.  Speech is fluent. Motor exam as listed above. Psychiatric: Judgment intact, Mood & affect appropriate for pt's clinical situation. Dermatologic: Venous rashes no ulcers noted.  No changes consistent with cellulitis. Lymph : No lichenification or skin changes of chronic lymphedema.  CBC Lab Results  Component Value Date   WBC 5.7 06/11/2023   HGB 10.2 (L) 06/11/2023   HCT 32.9 (L) 06/11/2023   MCV 101.9 (H) 06/11/2023   PLT 289 06/11/2023    BMET    Component Value Date/Time   NA 140 06/26/2023 1049   NA 140 09/23/2012 1018   K 4.7 06/26/2023 1049   K 4.4 09/23/2012 1018   CL 98 06/26/2023 1049   CL 103 09/23/2012 1018   CO2 27 06/26/2023 1049   CO2 30 09/23/2012 1018   GLUCOSE 87 06/26/2023 1049   GLUCOSE 85 06/11/2023 0320   GLUCOSE 129 (H) 09/23/2012 1018   BUN 53 (H) 06/26/2023 1049   BUN 28 (H) 09/23/2012 1018   CREATININE 2.61 (H) 06/26/2023 1049   CREATININE 1.94 (H) 09/23/2012 1018   CALCIUM 9.2 06/26/2023 1049   CALCIUM 9.4 09/23/2012 1018   GFRNONAA 42 (L) 06/11/2023 0320   GFRNONAA 34 (L) 09/23/2012 1018   GFRAA 35 (L) 07/27/2019 0316   GFRAA 40 (L) 09/23/2012 1018   CrCl cannot be calculated (Unknown ideal weight.).  COAG Lab Results  Component Value Date   INR 1.4 (H) 05/30/2023   INR 1.5 (H) 06/02/2022   INR 1.1 05/28/2022    Radiology DG CHEST PORT 1 VIEW Result  Date: 06/09/2023 CLINICAL DATA:  Shortness of breath EXAM: PORTABLE CHEST 1 VIEW COMPARISON:  05/28/2022 FINDINGS:  Cardiac shadow is within normal limits. Aortic calcifications are seen. The lungs are well aerated bilaterally. Calcified granuloma is noted in the right base. No focal infiltrate is seen. Mild chronic scarring is noted in the right costophrenic angle. Bilateral shoulder replacements are seen. No acute bony abnormality is noted. IMPRESSION: Chronic changes without acute abnormality. Electronically Signed   By: Alcide Clever M.D.   On: 06/09/2023 20:40   DG Pelvis Portable Result Date: 06/09/2023 CLINICAL DATA:  Elective surgery, postreduction. EXAM: PORTABLE PELVIS 1-2 VIEWS COMPARISON:  Radiograph yesterday FINDINGS: Two fluoroscopic spot views of the pelvis submitted from the operating room. The previous left hip arthroplasty dislocation has been reduced. Alignment is anatomic. No fracture is seen. IMPRESSION: Reduction of left arthroplasty dislocation.  No fracture is seen. Electronically Signed   By: Narda Rutherford M.D.   On: 06/09/2023 16:12     Assessment/Plan 1. Lymphedema (Primary) Recommend:  I have had a long discussion with the patient regarding swelling and why it  causes symptoms.  Patient will begin wearing graduated compression on a daily basis a prescription was given. The patient will  wear the stockings first thing in the morning and removing them in the evening. The patient is instructed specifically not to sleep in the stockings.   In addition, behavioral modification will be initiated.  This will include frequent elevation, use of over the counter pain medications and exercise such as walking.  Consideration for a lymph pump will also be made based upon the effectiveness of conservative therapy.  This would help to improve the edema control and prevent sequela such as ulcers and infections   Patient has had a duplex ultrasound of the venous system which was negative  for DVT.    The patient will follow-up with me in 3 months.   2. Renal insufficiency The patient has advanced renal disease.  However, at the present time the patient is not yet on dialysis.  Avoid nephrotoxic medications and dehydration.  Further plans per nephrology  3. Essential hypertension Continue antihypertensive medications as already ordered, these medications have been reviewed and there are no changes at this time.  4. NSTEMI (non-ST elevated myocardial infarction) (HCC) Continue cardiac and antihypertensive medications as already ordered and reviewed, no changes at this time.  Continue statin as ordered and reviewed, no changes at this time  Nitrates PRN for chest pain  5. Atrial fibrillation with rapid ventricular response (HCC) Continue antiarrhythmia medications as already ordered, these medications have been reviewed and there are no changes at this time.  Continue anticoagulation as ordered by Cardiology Service    Levora Dredge, MD  07/08/2023 2:06 PM

## 2023-07-09 ENCOUNTER — Ambulatory Visit (INDEPENDENT_AMBULATORY_CARE_PROVIDER_SITE_OTHER): Admitting: Vascular Surgery

## 2023-07-09 ENCOUNTER — Encounter (INDEPENDENT_AMBULATORY_CARE_PROVIDER_SITE_OTHER): Payer: Self-pay | Admitting: Vascular Surgery

## 2023-07-09 VITALS — BP 119/71 | HR 67 | Resp 16 | Ht 71.0 in | Wt 228.4 lb

## 2023-07-09 DIAGNOSIS — I10 Essential (primary) hypertension: Secondary | ICD-10-CM

## 2023-07-09 DIAGNOSIS — N289 Disorder of kidney and ureter, unspecified: Secondary | ICD-10-CM

## 2023-07-09 DIAGNOSIS — I890 Lymphedema, not elsewhere classified: Secondary | ICD-10-CM

## 2023-07-09 DIAGNOSIS — I4891 Unspecified atrial fibrillation: Secondary | ICD-10-CM

## 2023-07-09 DIAGNOSIS — I214 Non-ST elevation (NSTEMI) myocardial infarction: Secondary | ICD-10-CM

## 2023-07-12 ENCOUNTER — Encounter (INDEPENDENT_AMBULATORY_CARE_PROVIDER_SITE_OTHER): Payer: Self-pay | Admitting: Vascular Surgery

## 2023-07-15 ENCOUNTER — Ambulatory Visit: Attending: Medical

## 2023-07-15 DIAGNOSIS — I5033 Acute on chronic diastolic (congestive) heart failure: Secondary | ICD-10-CM | POA: Diagnosis not present

## 2023-07-15 LAB — ECHOCARDIOGRAM COMPLETE
AV Mean grad: 4 mmHg
AV Peak grad: 6.5 mmHg
Ao pk vel: 1.27 m/s
Area-P 1/2: 4.31 cm2
Calc EF: 52 %
Single Plane A2C EF: 51.7 %
Single Plane A4C EF: 50.9 %

## 2023-07-17 ENCOUNTER — Ambulatory Visit: Attending: Medical | Admitting: Medical

## 2023-07-17 ENCOUNTER — Encounter: Payer: Self-pay | Admitting: Medical

## 2023-07-17 VITALS — BP 125/76 | HR 72 | Ht 71.0 in | Wt 227.4 lb

## 2023-07-17 DIAGNOSIS — I480 Paroxysmal atrial fibrillation: Secondary | ICD-10-CM

## 2023-07-17 DIAGNOSIS — N1832 Chronic kidney disease, stage 3b: Secondary | ICD-10-CM

## 2023-07-17 DIAGNOSIS — I5032 Chronic diastolic (congestive) heart failure: Secondary | ICD-10-CM | POA: Diagnosis not present

## 2023-07-17 DIAGNOSIS — I10 Essential (primary) hypertension: Secondary | ICD-10-CM

## 2023-07-17 NOTE — Patient Instructions (Signed)
 Medication Instructions:  Your physician recommends that you continue on your current medications as directed. Please refer to the Current Medication list given to you today.   *If you need a refill on your cardiac medications before your next appointment, please call your pharmacy*  Lab Work: Your provider would like for you to have following labs drawn today (BMP).    Testing/Procedures: No test ordered today   Follow-Up: At [facility], you and your health needs are our priority.  As part of our continuing mission to provide you with exceptional heart care, our providers are all part of one team.  This team includes your primary Cardiologist (physician) and Advanced Practice Providers or APPs (Physician Assistants and Nurse Practitioners) who all work together to provide you with the care you need, when you need it.  Your next appointment:   3 month(s)  Provider:   You may see Julien Nordmann, MD or one of the following Advanced Practice Providers on your designated Care Team:   Nicolasa Ducking, NP Ames Dura, PA-C Eula Listen, PA-C Cadence Clayton, PA-C Charlsie Quest, NP Carlos Levering, NP    We recommend signing up for the patient portal called "MyChart".  Sign up information is provided on this After Visit Summary.  MyChart is used to connect with patients for Virtual Visits (Telemedicine).  Patients are able to view lab/test results, encounter notes, upcoming appointments, etc.  Non-urgent messages can be sent to your provider as well.   To learn more about what you can do with MyChart, go to [email or web address].

## 2023-07-17 NOTE — Progress Notes (Signed)
 Cardiology Office Note:  .   Date:  07/17/2023  ID:  Gavin Maxwell, DOB 24-Oct-1945, MRN [id number] PCP: Marguarite Arbour, MD  [facility] Providers Cardiologist:  Julien Nordmann, MD {  History of Present Illness: .   Gavin Maxwell is a 78 y.o. 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   Patient was initially evaluated by heart care in 2020.  He was referred to EP for SVT.  He underwent ablation March 08, 2019.   Patient was diagnosed with new onset A-fib with RVR in February 2024.  Echo at that time showed EF of 60 to 65%, moderate concentric LVH.  TEE showed EF of 45 to 50%, mild MR.  Troponin peaked at greater than 2000.  He denied any chest pain.  He was successfully cardioverted to normal sinus rhythm.  He was started on amiodarone.  Myoview Lexiscan was low risk.  Echo showed EF of 45 to 50%.   The patient was hospitalized 3/4-3/7 with left hip dislocation. He underwent reduction.   The patient was last seen 06/2023 and was still recovering from left hip reduction. He reports volume overload and he took Lasix 3 times in the week.  He was instructed he take Lasix for 3 days then go back down to 20 mg daily.  He was in normal sinus rhythm. Echo showed LVEF 55-60%, G1DD, mild MR.   Today, the patient says swelling is much better. He is taking lasix daily. His left hip is improving. The patient is doing PT. He may need right hip replacement in the future. He denies chest pain ro SOB. Swelling is much better.    Studies Reviewed: .       Echo 07/2023 1. Left ventricular ejection fraction, by estimation, is 55 to 60%. The  left ventricle has normal function. The left ventricle has no regional  wall motion abnormalities. Left ventricular  diastolic parameters are  consistent with Grade I diastolic  dysfunction (impaired relaxation).   2. Right ventricular systolic function is normal. The right ventricular  size is normal.   3. Left atrial size was mildly dilated.   4. The mitral valve is normal in structure. Mild mitral valve  regurgitation. No evidence of mitral stenosis.   5. The aortic valve has an indeterminant number of cusps. Aortic valve  regurgitation is not visualized. No aortic stenosis is present.   6. The inferior vena cava is normal in size with greater than 50%  respiratory variability, suggesting right atrial pressure of 3 mmHg.   Myoview Lexiscan 07/2022 Narrative & Impression      Normal pharmacologic myocardial perfusion stress test without evidence of significant ischemia or scar.   Left ventricular systolic function is normal (LVEF > 55%).   Three-vessel coronary artery calcification noted on the attenuation correction CT.  There is also aortic atherosclerosis.   This is a low risk study.    Echo TEE 05/2022 1. Left ventricular ejection fraction, by estimation, is 45 to 50%. The  left ventricle has mildly decreased function. The left ventricle  demonstrates global hypokinesis. Left ventricular diastolic parameters are  indeterminate.   2. Right ventricular systolic function is normal. The right ventricular  size is normal.   3. Left atrial size was moderately dilated. No left atrial/left atrial  appendage  thrombus was detected.   4. The mitral valve is normal in structure. Mild mitral valve  regurgitation. No evidence of mitral stenosis.   5. The aortic valve is normal in structure. Aortic valve regurgitation is  not visualized. Aortic valve sclerosis is present, with no evidence of  aortic valve stenosis.   6. There is Moderate (Grade III) atheroma plaque involving the aortic  arch and descending aorta.   7. The inferior vena cava is normal in size with greater than 50%  respiratory  variability, suggesting right atrial pressure of 3 mmHg.   8. Agitated saline contrast bubble study was negative, with no evidence  of any interatrial shunt.    Echo 05/2022 1. Left ventricular ejection fraction, by estimation, is 60 to 65%. The  left ventricle has normal function. The left ventricle has no regional  wall motion abnormalities. There is moderate concentric left ventricular  hypertrophy. Left ventricular  diastolic parameters are indeterminate.   2. Right ventricular systolic function is normal. The right ventricular  size is normal.   3. The mitral valve is normal in structure. Mild mitral valve  regurgitation. No evidence of mitral stenosis.   4. The aortic valve is normal in structure. Aortic valve regurgitation is  not visualized. Aortic valve sclerosis/calcification is present, without  any evidence of aortic stenosis.   5. The inferior vena cava is normal in size with greater than 50%  respiratory variability, suggesting right atrial pressure of 3 mmHg.        Physical Exam:   VS:  BP 125/76 (BP Location: Left Arm, Patient Position: Sitting, Cuff Size: Normal)   Pulse 72   Ht 5\' 11"  (1.803 m)   Wt 227 lb 6.4 oz (103.1 kg)   SpO2 99%   BMI 31.72 kg/m    Wt Readings from Last 3 Encounters:  07/17/23 227 lb 6.4 oz (103.1 kg)  07/09/23 228 lb 6.4 oz (103.6 kg)  06/19/23 225 lb (102.1 kg)    GEN: Well nourished, well developed in no acute distress NECK: No JVD; No carotid bruits CARDIAC:RRR, no murmurs, rubs, gallops RESPIRATORY:  Clear to auscultation without rales, wheezing or rhonchi  ABDOMEN: Soft, non-tender, non-distended EXTREMITIES:  2+ lower leg edema; No deformity   ASSESSMENT AND PLAN: .    Diastolic heart failure Lasix was increased at the last visit with improvement of swelling. He also has lymphedema and is followed by [facility]. Swelling worse in the setting of sedentary lifestyle due to hip issues. He is taking lasix 20mg  daily. Follw-up labs  showed AKI/dehydration. He still has swelling on exam, likely from lymphedema. I will check a BMET today.   Paroxysmal Afib He is in NSR on exam. Continue Eliquis 2.5mg  BID for stroke ppx. Continue Toprol 25mg  daily and amiodarone 200mg  daily.   HTN BP is normal, continue current medications.   CKD Stage 3 Scr up 1.63>2.61, BUN 28>53 with lasix. He is taking lasix daily. Repeat BMET today, may only require lasix once weekly.   Dispo: Follow-up in 3 months  Signed, Akesha Uresti David Stall, PA-C

## 2023-07-18 LAB — BASIC METABOLIC PANEL WITH GFR
BUN/Creatinine Ratio: 14 (ref 10–24)
BUN: 33 mg/dL — ABNORMAL HIGH (ref 8–27)
CO2: 24 mmol/L (ref 20–29)
Calcium: 9 mg/dL (ref 8.6–10.2)
Chloride: 101 mmol/L (ref 96–106)
Creatinine, Ser: 2.3 mg/dL — ABNORMAL HIGH (ref 0.76–1.27)
Glucose: 131 mg/dL — ABNORMAL HIGH (ref 70–99)
Potassium: 3.9 mmol/L (ref 3.5–5.2)
Sodium: 139 mmol/L (ref 134–144)
eGFR: 28 mL/min/{1.73_m2} — ABNORMAL LOW (ref >59)

## 2023-07-21 ENCOUNTER — Other Ambulatory Visit: Payer: Self-pay

## 2023-07-21 DIAGNOSIS — N1832 Chronic kidney disease, stage 3b: Secondary | ICD-10-CM

## 2023-07-21 DIAGNOSIS — E039 Hypothyroidism, unspecified: Secondary | ICD-10-CM | POA: Diagnosis not present

## 2023-07-21 DIAGNOSIS — I5032 Chronic diastolic (congestive) heart failure: Secondary | ICD-10-CM

## 2023-07-21 DIAGNOSIS — I480 Paroxysmal atrial fibrillation: Secondary | ICD-10-CM | POA: Diagnosis not present

## 2023-07-21 DIAGNOSIS — Z79899 Other long term (current) drug therapy: Secondary | ICD-10-CM | POA: Diagnosis not present

## 2023-07-21 DIAGNOSIS — R7309 Other abnormal glucose: Secondary | ICD-10-CM | POA: Diagnosis not present

## 2023-07-21 DIAGNOSIS — N183 Chronic kidney disease, stage 3 unspecified: Secondary | ICD-10-CM | POA: Diagnosis not present

## 2023-07-21 DIAGNOSIS — F411 Generalized anxiety disorder: Secondary | ICD-10-CM | POA: Diagnosis not present

## 2023-07-21 DIAGNOSIS — I10 Essential (primary) hypertension: Secondary | ICD-10-CM | POA: Diagnosis not present

## 2023-07-21 DIAGNOSIS — E782 Mixed hyperlipidemia: Secondary | ICD-10-CM | POA: Diagnosis not present

## 2023-07-23 DIAGNOSIS — M25551 Pain in right hip: Secondary | ICD-10-CM | POA: Diagnosis not present

## 2023-07-31 DIAGNOSIS — G4733 Obstructive sleep apnea (adult) (pediatric): Secondary | ICD-10-CM | POA: Diagnosis not present

## 2023-07-31 DIAGNOSIS — D487 Neoplasm of uncertain behavior of other specified sites: Secondary | ICD-10-CM | POA: Diagnosis not present

## 2023-07-31 DIAGNOSIS — L03211 Cellulitis of face: Secondary | ICD-10-CM | POA: Diagnosis not present

## 2023-07-31 DIAGNOSIS — H6123 Impacted cerumen, bilateral: Secondary | ICD-10-CM | POA: Diagnosis not present

## 2023-08-05 DIAGNOSIS — G4733 Obstructive sleep apnea (adult) (pediatric): Secondary | ICD-10-CM | POA: Diagnosis not present

## 2023-08-11 DIAGNOSIS — M25512 Pain in left shoulder: Secondary | ICD-10-CM | POA: Diagnosis not present

## 2023-08-11 DIAGNOSIS — M545 Low back pain, unspecified: Secondary | ICD-10-CM | POA: Diagnosis not present

## 2023-08-11 DIAGNOSIS — M25511 Pain in right shoulder: Secondary | ICD-10-CM | POA: Diagnosis not present

## 2023-08-13 DIAGNOSIS — M25551 Pain in right hip: Secondary | ICD-10-CM | POA: Diagnosis not present

## 2023-08-13 DIAGNOSIS — M545 Low back pain, unspecified: Secondary | ICD-10-CM | POA: Diagnosis not present

## 2023-08-19 DIAGNOSIS — G4733 Obstructive sleep apnea (adult) (pediatric): Secondary | ICD-10-CM | POA: Diagnosis not present

## 2023-08-20 DIAGNOSIS — D0439 Carcinoma in situ of skin of other parts of face: Secondary | ICD-10-CM | POA: Diagnosis not present

## 2023-08-21 DIAGNOSIS — Z79899 Other long term (current) drug therapy: Secondary | ICD-10-CM | POA: Diagnosis not present

## 2023-08-24 DIAGNOSIS — I10 Essential (primary) hypertension: Secondary | ICD-10-CM | POA: Diagnosis not present

## 2023-08-24 DIAGNOSIS — I480 Paroxysmal atrial fibrillation: Secondary | ICD-10-CM | POA: Diagnosis not present

## 2023-08-24 DIAGNOSIS — E039 Hypothyroidism, unspecified: Secondary | ICD-10-CM | POA: Diagnosis not present

## 2023-08-24 DIAGNOSIS — F411 Generalized anxiety disorder: Secondary | ICD-10-CM | POA: Diagnosis not present

## 2023-08-24 DIAGNOSIS — N183 Chronic kidney disease, stage 3 unspecified: Secondary | ICD-10-CM | POA: Diagnosis not present

## 2023-08-24 DIAGNOSIS — E782 Mixed hyperlipidemia: Secondary | ICD-10-CM | POA: Diagnosis not present

## 2023-08-24 DIAGNOSIS — Z79899 Other long term (current) drug therapy: Secondary | ICD-10-CM | POA: Diagnosis not present

## 2023-08-24 DIAGNOSIS — N184 Chronic kidney disease, stage 4 (severe): Secondary | ICD-10-CM | POA: Diagnosis not present

## 2023-08-25 ENCOUNTER — Encounter (INDEPENDENT_AMBULATORY_CARE_PROVIDER_SITE_OTHER): Payer: Self-pay

## 2023-08-26 ENCOUNTER — Telehealth: Payer: Self-pay

## 2023-08-26 NOTE — Telephone Encounter (Signed)
   Pre-operative Risk Assessment    Patient Name: Gavin Maxwell  DOB: 1945-06-05 MRN: [id number]   Date of last office visit: 07/17/23 with Cadence Stana Ear Date of next office visit: 10/19/23 with Cadence Furt, PA-C   Request for Surgical Clearance    Procedure:  Left Shoulder removal of prosthesis due to broken hardware  Date of Surgery:  Clearance TBD                                Surgeon:  Dr. Osa Blase Surgeon's Group or Practice Name:  [facility] Phone number:  [phone] Attn: Mark Sil Fax number:  [phone]   Type of Clearance Requested:   - Medical  - Pharmacy:  Hold Apixaban  (Eliquis ) not indicated   Type of Anesthesia:  General with interscalene block   Additional requests/questions:    Kenny Peals   08/26/2023, 4:02 PM

## 2023-08-26 NOTE — Telephone Encounter (Signed)
 Candace,  You saw this patient on 07/17/2023. Per protocol we request that you comment on his cardiac risk to proceed with  Left Shoulder removal of prosthesis due to broken hardware  since it has been less than 2 months since evaluated in the office. I have sent out a pharm request to address Eliquis . Please send your comment to P CV Pre-Op Pool.  Thank you, Friddie Jetty DNP, ANP, AACC.

## 2023-08-26 NOTE — Telephone Encounter (Signed)
 Pharmacy please advise on holding Eliquis  prior to Left Shoulder removal of prosthesis due to broken hardware scheduled for TBD. Thank you.

## 2023-08-28 DIAGNOSIS — Z79899 Other long term (current) drug therapy: Secondary | ICD-10-CM | POA: Diagnosis not present

## 2023-08-30 NOTE — Telephone Encounter (Signed)
 Patient with diagnosis of atrial fibrillation on Eliquis  for anticoagulation.    Procedure:  Left Shoulder removal of prosthesis due to broken hardware   Date of Surgery:  Clearance TBD     CHA2DS2-VASc Score = 5   This indicates a 7.2% annual risk of stroke. The patient's score is based upon: CHF History: 1 HTN History: 1 Diabetes History: 0 Stroke History: 0 Vascular Disease History: 1 Age Score: 2 Gender Score: 0    CrCl 43 Platelet count 241  Patient has not had an Afib/aflutter ablation within the last 3 months or DCCV within the last 30 days  Per office protocol, patient can hold Eliquis  for 3 days prior to procedure.   Patient will not need bridging with Lovenox  (enoxaparin ) around procedure.  **This guidance is not considered finalized until pre-operative APP has relayed final recommendations.**

## 2023-08-31 NOTE — Telephone Encounter (Signed)
   Patient Name: Gavin Maxwell  DOB: 08-12-1945 MRN: [id number]  Primary Cardiologist: Belva Boyden, MD  Chart reviewed as part of pre-operative protocol coverage. Given past medical history and time since last visit, based on ACC/AHA guidelines, Gavin Maxwell is at acceptable risk for the planned procedure without further cardiovascular testing.   The patient was advised that if he develops new symptoms prior to surgery to contact our office to arrange for a follow-up visit, and he verbalized understanding.  Per office protocol, patient can hold Eliquis  for 3 days prior to procedure.   Patient will not need bridging with Lovenox  (enoxaparin ) around procedure.  I will route this recommendation to the requesting party via Epic fax function and remove from pre-op pool.  Please call with questions.  Francene Ing, Retha Cast, NP 08/31/2023, 10:13 AM

## 2023-09-04 NOTE — Telephone Encounter (Signed)
   Patient Name: Gavin Maxwell  DOB: April 28, 1945 MRN: [id number]  Primary Cardiologist: Belva Boyden, MD  Chart reviewed as part of pre-operative protocol coverage. Given past medical history and time since last visit, based on ACC/AHA guidelines, [name] is at acceptable risk for the planned procedure without further cardiovascular testing. Procedure:  Left Shoulder removal of prosthesis due to broken hardware   Date of Surgery:  Clearance TBD       CHA2DS2-VASc Score = 5   This indicates a 7.2% annual risk of stroke. The patient's score is based upon: CHF History: 1 HTN History: 1 Diabetes History: 0 Stroke History: 0 Vascular Disease History: 1 Age Score: 2 Gender Score: 0     CrCl 43 Platelet count 241   Patient has not had an Afib/aflutter ablation within the last 3 months or DCCV within the last 30 days   Per office protocol, patient can hold Eliquis  for 3 days prior to procedure.   Patient will not need bridging with Lovenox  (enoxaparin ) around procedure.  The patient was advised that if he develops new symptoms prior to surgery to contact our office to arrange for a follow-up visit, and he verbalized understanding.  I will route this recommendation to the requesting party via Epic fax function and remove from pre-op pool.  Please call with questions.  Friddie Jetty, NP 09/04/2023, 7:45 AM

## 2023-09-05 DIAGNOSIS — G4733 Obstructive sleep apnea (adult) (pediatric): Secondary | ICD-10-CM | POA: Diagnosis not present

## 2023-09-07 DIAGNOSIS — H15842 Scleral ectasia, left eye: Secondary | ICD-10-CM | POA: Diagnosis not present

## 2023-09-07 DIAGNOSIS — H401133 Primary open-angle glaucoma, bilateral, severe stage: Secondary | ICD-10-CM | POA: Diagnosis not present

## 2023-09-07 DIAGNOSIS — I129 Hypertensive chronic kidney disease with stage 1 through stage 4 chronic kidney disease, or unspecified chronic kidney disease: Secondary | ICD-10-CM | POA: Diagnosis not present

## 2023-09-07 DIAGNOSIS — H2513 Age-related nuclear cataract, bilateral: Secondary | ICD-10-CM | POA: Diagnosis not present

## 2023-09-07 DIAGNOSIS — N1832 Chronic kidney disease, stage 3b: Secondary | ICD-10-CM | POA: Diagnosis not present

## 2023-09-07 DIAGNOSIS — R600 Localized edema: Secondary | ICD-10-CM | POA: Diagnosis not present

## 2023-09-10 DIAGNOSIS — N1832 Chronic kidney disease, stage 3b: Secondary | ICD-10-CM | POA: Diagnosis not present

## 2023-09-10 DIAGNOSIS — D631 Anemia in chronic kidney disease: Secondary | ICD-10-CM | POA: Diagnosis not present

## 2023-09-10 DIAGNOSIS — I129 Hypertensive chronic kidney disease with stage 1 through stage 4 chronic kidney disease, or unspecified chronic kidney disease: Secondary | ICD-10-CM | POA: Diagnosis not present

## 2023-09-10 DIAGNOSIS — R600 Localized edema: Secondary | ICD-10-CM | POA: Diagnosis not present

## 2023-09-29 DIAGNOSIS — M25512 Pain in left shoulder: Secondary | ICD-10-CM | POA: Diagnosis not present

## 2023-09-30 NOTE — Progress Notes (Signed)
 Sent message, via epic in basket, requesting orders in epic from Careers adviser.

## 2023-10-05 ENCOUNTER — Ambulatory Visit (INDEPENDENT_AMBULATORY_CARE_PROVIDER_SITE_OTHER): Admitting: Vascular Surgery

## 2023-10-05 DIAGNOSIS — G4733 Obstructive sleep apnea (adult) (pediatric): Secondary | ICD-10-CM | POA: Diagnosis not present

## 2023-10-06 NOTE — Patient Instructions (Signed)
 SURGICAL WAITING ROOM VISITATION Patients having surgery or a procedure may have no more than 2 support people in the waiting area - these visitors may rotate in the visitor waiting room.   If the patient needs to stay at the hospital during part of their recovery, the visitor guidelines for inpatient rooms apply.  PRE-OP VISITATION  Pre-op nurse will coordinate an appropriate time for 1 support person to accompany the patient in pre-op.  This support person may not rotate.  This visitor will be contacted when the time is appropriate for the visitor to come back in the pre-op area.  Please refer to the [facility] website for the visitor guidelines for Inpatients (after your surgery is over and you are in a regular room).  You are not required to quarantine at this time prior to your surgery. However, you must do this: Hand Hygiene often Do NOT share personal items Notify your provider if you are in close contact with someone who has COVID or you develop fever 100.4 or greater, new onset of sneezing, cough, sore throat, shortness of breath or body aches.  If you test positive for Covid or have been in contact with anyone that has tested positive in the last 10 days please notify you surgeon.    Your procedure is scheduled on:  Tuesday  October 20, 2023   Report to [facility] Main Entrance: [address] entrance where the [employer] is available.   Report to admitting at:  08:30   AM  Call this number if you have any questions or problems the morning of surgery [phone]  FOLLOW ANY ADDITIONAL PRE OP INSTRUCTIONS YOU RECEIVED FROM YOUR SURGEON'S OFFICE!!!  Do not eat food after Midnight the night prior to your surgery/procedure.  After Midnight you may have the following liquids until  07:45  AM  DAY OF SURGERY  Clear Liquid Diet Water  Black Coffee (sugar ok, NO MILK/CREAM OR CREAMERS)  Tea (sugar ok, NO MILK/CREAM OR CREAMERS) regular and decaf                              Plain Jell-O  with no fruit (NO RED)                                           Fruit ices (not with fruit pulp, NO RED)                                     Popsicles (NO RED)                                                                  Juice: NO CITRUS JUICES: only apple, WHITE grape, WHITE cranberry Sports drinks like Gatorade or Powerade (NO RED)                   The day of surgery:  Drink ONE (1) Pre-Surgery Clear Ensure at  08:30 AM the morning of surgery. Drink in one sitting. Do not sip.  This drink  was given to you during your hospital pre-op appointment visit. Nothing else to drink after completing the Pre-Surgery Clear Ensure: No candy, chewing gum or throat lozenges.     Oral Hygiene is also important to reduce your risk of infection.        Remember - BRUSH YOUR TEETH THE MORNING OF SURGERY WITH YOUR REGULAR TOOTHPASTE  Do NOT smoke after Midnight the night before surgery.  STOP TAKING all Vitamins, Herbs and supplements 1 week before your surgery.   FARXIGA - Stop taking 72 hours before your surgery. Last dose will be taken on 10-16-2023  ELIQUIS - Stop taking 72 hours before your surgery. Last dose will be taken on 10-16-2023  Take ONLY these medicines the morning of surgery with A SIP OF WATER : pantoprazole , levothyroxine , metoprolol , amiodarone , and Tylenol  if needed. You may use your Eye drops, Flonase  nasal spray and your Albuterol  inhaler if needed. Please bring your inhaler with you on the day of surgery.    If You have been diagnosed with Sleep Apnea - Bring CPAP mask and tubing day of surgery. We will provide you with a CPAP machine on the day of your surgery.  Bring your Spinal cord stimulator remote control with you to the hospital on the day of surgery.                   You may not have any metal on your body including  jewelry, and body piercing  Do not wear  lotions, powders, cologne, or deodorant  Men may shave face and neck.  Contacts, Hearing  Aids, dentures or bridgework may not be worn into surgery. DENTURES WILL BE REMOVED PRIOR TO SURGERY PLEASE DO NOT APPLY Poly grip OR ADHESIVES!!!  You may bring a small overnight bag with you on the day of surgery, only pack items that are not valuable. [facility] IS NOT RESPONSIBLE   FOR VALUABLES THAT ARE LOST OR STOLEN.   Do not bring your home medications to the hospital. The Pharmacy will dispense medications listed on your medication list to you during your admission in the Hospital.  Special Instructions: Bring a copy of your healthcare power of attorney and living will documents the day of surgery, if you wish to have them scanned into your [facility] Records- EPIC  Please read over the following fact sheets you were given: IF YOU HAVE QUESTIONS ABOUT YOUR PRE-OP INSTRUCTIONS, PLEASE CALL [phone]   [facility] - Preparing for Surgery Before surgery, you can play an important role.  Because skin is not sterile, your skin needs to be as free of germs as possible.  You can reduce the number of germs on your skin by washing with CHG (chlorahexidine gluconate) soap before surgery.  CHG is an antiseptic cleaner which kills germs and bonds with the skin to continue killing germs even after washing. Please DO NOT use if you have an allergy to CHG or antibacterial soaps.  If your skin becomes reddened/irritated stop using the CHG and inform your nurse when you arrive at Short Stay. Do not shave (including legs and underarms) for at least 48 hours prior to the first CHG shower.  You may shave your face/neck.  Please follow these instructions carefully:  1.  Shower with CHG Soap the night before surgery and the  morning of surgery.  2.  If you choose to wash your hair, wash your hair first as usual with your normal  shampoo.  3.  After you shampoo, rinse  your hair and body thoroughly to remove the shampoo.                             4.  Use CHG as you would any other liquid soap.   You can apply chg directly to the skin and wash.  Gently with a scrungie or clean washcloth.  5.  Apply the CHG Soap to your body ONLY FROM THE NECK DOWN.   Do not use on face/ open                           Wound or open sores. Avoid contact with eyes, ears mouth and genitals (private parts).                       Wash face,  Genitals (private parts) with your normal soap.             6.  Wash thoroughly, paying special attention to the area where your  surgery  will be performed.  7.  Thoroughly rinse your body with warm water  from the neck down.  8.  DO NOT shower/wash with your normal soap after using and rinsing off the CHG Soap.            9.  Pat yourself dry with a clean towel.            10.  Wear clean pajamas.            11.  Place clean sheets on your bed the night of your first shower and do not  sleep with pets.  ON THE DAY OF SURGERY : Do not apply any lotions/deodorants the morning of surgery.  Please wear clean clothes to the hospital/surgery center.     FAILURE TO FOLLOW THESE INSTRUCTIONS MAY RESULT IN THE CANCELLATION OF YOUR SURGERY  PATIENT SIGNATURE_________________________________  NURSE SIGNATURE__________________________________  ________________________________________________________________________        Gavin Maxwell    An incentive spirometer is a tool that can help keep your lungs clear and active. This tool measures how well you are filling your lungs with each breath. Taking long deep breaths may help reverse or decrease the chance of developing breathing (pulmonary) problems (especially infection) following: A long period of time when you are unable to move or be active. BEFORE THE PROCEDURE  If the spirometer includes an indicator to show your best effort, your nurse or respiratory therapist will set it to a desired goal. If possible, sit up straight or lean slightly forward. Try not to slouch. Hold the incentive spirometer in an  upright position. INSTRUCTIONS FOR USE  Sit on the edge of your bed if possible, or sit up as far as you can in bed or on a chair. Hold the incentive spirometer in an upright position. Breathe out normally. Place the mouthpiece in your mouth and seal your lips tightly around it. Breathe in slowly and as deeply as possible, raising the piston or the ball toward the top of the column. Hold your breath for 3-5 seconds or for as long as possible. Allow the piston or ball to fall to the bottom of the column. Remove the mouthpiece from your mouth and breathe out normally. Rest for a few seconds and repeat Steps 1 through 7 at least 10 times every 1-2 hours when you are awake. Take your time and take a few  normal breaths between deep breaths. The spirometer may include an indicator to show your best effort. Use the indicator as a goal to work toward during each repetition. After each set of 10 deep breaths, practice coughing to be sure your lungs are clear. If you have an incision (the cut made at the time of surgery), support your incision when coughing by placing a pillow or rolled up towels firmly against it. Once you are able to get out of bed, walk around indoors and cough well. You may stop using the incentive spirometer when instructed by your caregiver.  RISKS AND COMPLICATIONS Take your time so you do not get dizzy or light-headed. If you are in pain, you may need to take or ask for pain medication before doing incentive spirometry. It is harder to take a deep breath if you are having pain. AFTER USE Rest and breathe slowly and easily. It can be helpful to keep track of a log of your progress. Your caregiver can provide you with a simple table to help with this. If you are using the spirometer at home, follow these instructions: SEEK MEDICAL CARE IF:  You are having difficultly using the spirometer. You have trouble using the spirometer as often as instructed. Your pain medication is not  giving enough relief while using the spirometer. You develop fever of 100.5 F (38.1 C) or higher.                                                                                                    SEEK IMMEDIATE MEDICAL CARE IF:  You cough up bloody sputum that had not been present before. You develop fever of 102 F (38.9 C) or greater. You develop worsening pain at or near the incision site. MAKE SURE YOU:  Understand these instructions. Will watch your condition. Will get help right away if you are not doing well or get worse. Document Released: 08/04/2006 Document Revised: 06/16/2011 Document Reviewed: 10/05/2006 [facility] Patient Information 2014 [place], [place].

## 2023-10-06 NOTE — Progress Notes (Signed)
 COVID Vaccine received:  []  No [x]  Yes Date of any COVID positive Test in last 90 days:  PCP - Reyes Costa,  MD Cardiologist - Evalene Lunger, MD, Lamarr Satterfield, NP  EP- Soyla Norton, MD Nephrology?  Chest x-ray - 06-09-2023  1v Epic EKG -  06-19-2023  Epic Stress Test - 07-22-2022  Epic ECHO - 07-15-2023  Epic Cardiac Cath -  CT Coronary Calcium  score:   Pacemaker / ICD device [x]  No []  Yes   Spinal Cord Stimulator:[]  No [x]  Yes  Placed 12-25-2022    bring remote     History of Sleep Apnea? []  No [x]  Yes   CPAP used?- []  No [x]  Yes    Does the patient monitor blood sugar?   [x]  N/A   []  No []  Yes  Patient has: [x]  NO Hx DM   []  Pre-DM   []  DM1  []   DM2 Last A1c was: 5.6  on  07-21-2023     FARXIGA  (Kidneys), Hold x 72 hrs  Last 10-16-23     Blood Thinner / Instructions: Eliquis   Hold x 72 hours. Last dose: 10-16-2023 Aspirin  Instructions:  ERAS Protocol Ordered: []  No  [x]  Yes PRE-SURGERY [x]  ENSURE  []  G2  Patient is to be NPO after: 0745  Dental hx: []  Dentures:  []  N/A      []  Bridge or Partial:                   []  Loose or Damaged teeth:   Comments:   Activity level: Able to walk up 2 flights of stairs without becoming significantly short of breath or having chest pain?  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  Patient verbalized understanding and agreement to the Pre-Surgical Instructions that were given to them at this PAT appointment. Patient was also educated of the need to review these PAT instructions again prior to his surgery.I reviewed the appropriate phone numbers to call if they have any and questions or concerns.

## 2023-10-07 ENCOUNTER — Other Ambulatory Visit: Payer: Self-pay

## 2023-10-07 ENCOUNTER — Encounter (HOSPITAL_COMMUNITY): Payer: Self-pay

## 2023-10-07 ENCOUNTER — Encounter (HOSPITAL_COMMUNITY)
Admission: RE | Admit: 2023-10-07 | Discharge: 2023-10-07 | Disposition: A | Discharge status: Home / Self Care | Source: Ambulatory Visit | Attending: Orthopedic Surgery | Admitting: Orthopedic Surgery

## 2023-10-07 VITALS — BP 131/79 | HR 62 | Temp 98.0°F | Resp 20 | Ht 71.0 in | Wt 219.0 lb

## 2023-10-07 DIAGNOSIS — Z8614 Personal history of Methicillin resistant Staphylococcus aureus infection: Secondary | ICD-10-CM | POA: Diagnosis not present

## 2023-10-07 DIAGNOSIS — I129 Hypertensive chronic kidney disease with stage 1 through stage 4 chronic kidney disease, or unspecified chronic kidney disease: Secondary | ICD-10-CM | POA: Insufficient documentation

## 2023-10-07 DIAGNOSIS — N1832 Chronic kidney disease, stage 3b: Secondary | ICD-10-CM | POA: Insufficient documentation

## 2023-10-07 DIAGNOSIS — Z01812 Encounter for preprocedural laboratory examination: Secondary | ICD-10-CM | POA: Insufficient documentation

## 2023-10-07 DIAGNOSIS — I10 Essential (primary) hypertension: Secondary | ICD-10-CM

## 2023-10-07 DIAGNOSIS — Z01818 Encounter for other preprocedural examination: Secondary | ICD-10-CM

## 2023-10-07 HISTORY — DX: Hypothyroidism, unspecified: E03.9

## 2023-10-07 LAB — CBC
HCT: 37 % — ABNORMAL LOW (ref 39.0–52.0)
Hemoglobin: 11.9 g/dL — ABNORMAL LOW (ref 13.0–17.0)
MCH: 32.6 pg (ref 26.0–34.0)
MCHC: 32.2 g/dL (ref 30.0–36.0)
MCV: 101.4 fL — ABNORMAL HIGH (ref 80.0–100.0)
Platelets: 245 10*3/uL (ref 150–400)
RBC: 3.65 MIL/uL — ABNORMAL LOW (ref 4.22–5.81)
RDW: 14.4 % (ref 11.5–15.5)
WBC: 4.1 10*3/uL (ref 4.0–10.5)
nRBC: 0 % (ref 0.0–0.2)

## 2023-10-07 LAB — BASIC METABOLIC PANEL WITH GFR
Anion gap: 8 (ref 5–15)
BUN: 42 mg/dL — ABNORMAL HIGH (ref 8–23)
CO2: 25 mmol/L (ref 22–32)
Calcium: 9.2 mg/dL (ref 8.9–10.3)
Chloride: 102 mmol/L (ref 98–111)
Creatinine, Ser: 1.95 mg/dL — ABNORMAL HIGH (ref 0.61–1.24)
GFR, Estimated: 34 mL/min — ABNORMAL LOW (ref >60)
Glucose, Bld: 95 mg/dL (ref 70–99)
Potassium: 5 mmol/L (ref 3.5–5.1)
Sodium: 135 mmol/L (ref 135–145)

## 2023-10-08 LAB — SURGICAL PCR SCREEN
MRSA, PCR: POSITIVE — AB
Staphylococcus aureus: POSITIVE — AB

## 2023-10-08 NOTE — Progress Notes (Signed)
 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 Appropriate notes have been placed on the patient's chart. This note has been routed to Dr. Josefina for review. The Patient's surgery is currently scheduled for:  10-20-2023 at [facility].  Shawnee Aloe, BSN, CVRN-BC   Pre-Surgical Testing Nurse [facility]  [phone]

## 2023-10-12 NOTE — H&P (Signed)
 PREOPERATIVE H&P  Chief Complaint: failed left shoulder surgery  HPI: Gavin Maxwell is a 78 y.o. male who presents regarding his left shoulder. He has a very complicated left shoulder history. He has had left shoulder surgery for sarcoma, and has had a lot of different complications with multiple reoperations including a distal periprosthetic fracture. He has a long stem cemented hemiarthroplasty that has failed. He has a distal humeral nonunion. At one point after surgery he had a radial nerve palsy that recovered after a year. He has minimal pain around the left shoulder, but has very limited function. He uses a brace chronically. At last visit in our office, x-rays were taken showing a fractured proximal head component.  He has elected for surgical management.  Past Medical History:  Diagnosis Date   Ambulates with cane    straight 4 prong cane   Anxiety    Cancer (HCC)    non hodgins  lymphoma   2004 in remission  Left arm  wears a brace there is a bone broken   CKD (chronic kidney disease) stage 3, GFR 30-59 ml/min (HCC)    COPD (chronic obstructive pulmonary disease) (HCC)    Depression    Dysrhythmia    SVT ,  A.fib   GERD (gastroesophageal reflux disease)    Hearing loss    wears hearing aids   History of hiatal hernia    Hypertension    Hypothyroidism    Pneumonia    x 1   Primary localized osteoarthritis of knee 09/26/2014   Primary localized osteoarthrosis, shoulder region, rotator cuff arthropathy 10/04/2013   Sleep apnea    uses cpap nightly   Past Surgical History:  Procedure Laterality Date   CARDIAC CATHETERIZATION     20 yrs. ago   CARDIOVERSION N/A 06/03/2022   Procedure: CARDIOVERSION;  Surgeon: Perla Evalene PARAS, MD;  Location: [facility];  Service: Cardiovascular;  Laterality: N/A;   CHOLECYSTECTOMY     EYE SURGERY Left    pt states he was seeing double and they did surgery to fix it   HIP CLOSED REDUCTION Left 06/09/2023   Procedure: CLOSED REDUCTION  HIP, LEFT;  Surgeon: Beverley Evalene BIRCH, MD;  Location: [facility];  Service: Orthopedics;  Laterality: Left;   NASAL SINUS SURGERY     x2   REVERSE SHOULDER ARTHROPLASTY Right 10/04/2013   Procedure: REVERSE SHOULDER ARTHROPLASTY;  Surgeon: [name], MD;  Location: [facility];  Service: Orthopedics;  Laterality: Right;   SHOULDER ACROMIOPLASTY Left    x 5 shoulder surgeries   SVT ABLATION N/A 03/07/2019   Procedure: SVT ABLATION;  Surgeon: Inocencio Soyla Lunger, MD;  Location: [facility];  Service: Cardiovascular;  Laterality: N/A;   TEE WITHOUT CARDIOVERSION N/A 06/03/2022   Procedure: TRANSESOPHAGEAL ECHOCARDIOGRAM;  Surgeon: Perla Evalene PARAS, MD;  Location: [facility];  Service: Cardiovascular;  Laterality: N/A;   THORACIC LAMINECTOMY FOR SPINAL CORD STIMULATOR N/A 02/19/2023   Procedure: THORACIC LAMINECTOMY AND PLACEMENT OF SPINAL CORD STIMULATOR AND BATTERY;  Surgeon: Beuford Anes, MD;  Location: [facility];  Service: Orthopedics;  Laterality: N/A;   TOTAL HIP ARTHROPLASTY Right 07/26/2019   Procedure: TOTAL HIP ARTHROPLASTY;  Surgeon: Olmsted Fonda, MD;  Location: [facility];  Service: Orthopedics;  Laterality: Right;   TOTAL HIP ARTHROPLASTY Left 1998   TOTAL KNEE ARTHROPLASTY Right 09/26/2014   Procedure: RIGHT TOTAL KNEE ARTHROPLASTY;  Surgeon: Fonda Olmsted, MD;  Location: [facility];  Service: Orthopedics;  Laterality: Right;   TRANSFORAMINAL  LUMBAR INTERBODY FUSION (TLIF) WITH PEDICLE SCREW FIXATION 2 LEVEL Right 06/05/2021   Procedure: RIGHT-SIDED LUMBAR 3- LUMBAR 4, LUMBAR 4- LUMBAR 5 TRANSFORAMINAL LUMBAR INTERBODY FUSION AND DECOMPRESSION WITH INSTRUMENTATION AND ALLOGRAFT;  Surgeon: Beuford Anes, MD;  Location: [facility];  Service: Orthopedics;  Laterality: Right;   VIDEO ASSISTED THORACOSCOPY (VATS) W/TALC  PLEUADESIS Right 08/18/2017   Procedure: VIDEO ASSISTED THORACOSCOPY (VATS)POSSIBLE  W/TALC  PLEUADESIS.POSSIBLE BLEBECTOMY;  Surgeon: Volney Lye, MD;  Location: [facility];  Service:  Thoracic;  Laterality: Right;   Social History   Socioeconomic History   Marital status: Married    Spouse name: Not on file   Number of children: 2   Years of education: Not on file   Highest education level: Not on file  Occupational History   Not on file  Tobacco Use   Smoking status: Former    Current packs/day: 0.00    Average packs/day: 2.0 packs/day for 20.0 years (40.0 ttl pk-yrs)    Types: Cigarettes    Start date: 66    Quit date: 48    Years since quitting: 35.5   Smokeless tobacco: Never   Tobacco comments:    quit 25 years ago  Vaping Use   Vaping status: Never Used  Substance and Sexual Activity   Alcohol use: No   Drug use: No   Sexual activity: Not Currently    Birth control/protection: None  Other Topics Concern   Not on file  Social History Narrative   Not on file   Social Drivers of Health   Financial Resource Strain: Low Risk  (07/21/2023)   Received from [facility]   Overall Financial Resource Strain (CARDIA)    Difficulty of Paying Living Expenses: Not hard at all  Food Insecurity: No Food Insecurity (07/21/2023)   Received from [facility]   Hunger Vital Sign    Within the past 12 months, you worried that your food would run out before you got the money to buy more.: Never true    Within the past 12 months, the food you bought just didn't last and you didn't have money to get more.: Never true  Transportation Needs: No Transportation Needs (07/21/2023)   Received from [facility]    In the past 12 months, has lack of transportation kept you from medical appointments or from getting medications?: No    Lack of Transportation (Non-Medical): No  Physical Activity: Not on file  Stress: Not on file  Social Connections: Patient Declined (06/09/2023)   Social Connection and Isolation Panel    Frequency of Communication with Friends and Family: Patient declined     Frequency of Social Gatherings with Friends and Family: Patient declined    Attends Religious Services: Patient declined    Database administrator or Organizations: Patient declined    Attends Engineer, structural: Patient declined    Marital Status: Patient declined   Family History  Problem Relation Age of Onset   Breast cancer Mother    Hypertension Mother    Rheum arthritis Father    Cancer Father    Lung cancer Brother    Stroke Paternal Grandfather    Allergies  Allergen Reactions   Amoxicillin-Pot Clavulanate Nausea Only and Other (See Comments)    Did it involve swelling of the face/tongue/throat, SOB, or low BP? No  Did it involve sudden or severe rash/hives, skin peeling, or any reaction on the inside of your  mouth or nose? No  Did you need to seek medical attention at a hospital or doctor's office? No  When did it last happen? More than 10 years  If all above answers are NO, may proceed with cephalosporin use.  Did it involve swelling of the face/tongue/throat, SOB, or low BP? No Did it involve sudden or severe rash/hives, skin peeling, or any reaction on the inside of your mouth or nose? No Did you need to seek medical attention at a hospital or doctor's office? No When did it last happen? More than 10 years If all above answers are NO, may proceed with cephalosporin use.   Celecoxib Nausea Only and Other (See Comments)    Upset stomach   Oxycodone  Other (See Comments)    Confusion, makes him crazy  Confusion, makes him crazy    Confusion, makes him crazy Confusion, makes him crazy   Prior to Admission medications   Medication Sig Start Date End Date Taking? Authorizing Provider  acetaminophen  (TYLENOL ) 500 MG tablet Take 1,000 mg by mouth every 6 (six) hours as needed for moderate pain (pain score 4-6).   Yes [provider]  albuterol  (PROVENTIL  HFA;VENTOLIN  HFA) 108 (90 Base) MCG/ACT inhaler Inhale 2 puffs into the lungs every 6  (six) hours as needed for wheezing or shortness of breath. 11/03/17  Yes Linard Alm NOVAK, MD  amiodarone  (PACERONE ) 200 MG tablet Take 1 tablet by mouth once daily 06/29/23  Yes Gollan, Timothy J, MD  apixaban  (ELIQUIS ) 2.5 MG TABS tablet Take 1 tablet (2.5 mg total) by mouth 2 (two) times daily. 06/01/23  Yes Gollan, Timothy J, MD  atorvastatin  (LIPITOR ) 80 MG tablet Take 1 tablet by mouth once daily Patient taking differently: Take 80 mg by mouth daily with supper. 03/02/23  Yes Gollan, Timothy J, MD  brimonidine -timolol  (COMBIGAN ) 0.2-0.5 % ophthalmic solution Place 1 drop into both eyes every 12 (twelve) hours.   Yes [provider]  Calcium  Carb-Cholecalciferol  (CALCIUM  600 + D PO) Take 1 tablet by mouth in the morning.   Yes [provider]  FARXIGA  10 MG TABS tablet Take 1 tablet (10 mg total) by mouth daily. 03/23/23  Yes Gollan, Timothy J, MD  fenofibrate  160 MG tablet Take 160 mg by mouth in the morning.   Yes [provider]  ferrous sulfate  325 (65 FE) MG tablet Take 325 mg by mouth in the morning.   Yes [provider]  fluticasone  (FLONASE ) 50 MCG/ACT nasal spray Place 2 sprays into both nostrils daily.    Yes [provider]  gabapentin  (NEURONTIN ) 300 MG capsule Take 300 mg by mouth 2 (two) times daily.   Yes [provider]  imipramine  (TOFRANIL ) 25 MG tablet Take 25 mg by mouth at bedtime.   Yes [provider]  levothyroxine  (SYNTHROID ) 50 MCG tablet Take 50 mcg by mouth daily before breakfast.   Yes [provider]  LUMIGAN 0.01 % SOLN Place 1 drop into both eyes at bedtime.   Yes [provider]  magnesium  oxide (MAG-OX) 400 MG tablet Take 400 mg by mouth in the morning and at bedtime. With supper and at bedtime 01/23/19  Yes [provider]  metoprolol  succinate (TOPROL -XL) 25 MG 24 hr tablet Take 1 tablet by mouth in the evening Patient taking differently: Take 25 mg by mouth in the  morning. 05/08/23  Yes Gollan, Timothy J, MD  Multiple Vitamin (MULTIVITAMIN WITH MINERALS) TABS tablet Take 1 tablet by mouth in the morning.  Yes [provider]  pantoprazole  (PROTONIX ) 40 MG tablet Take 40 mg by mouth in the morning.   Yes [provider]  polycarbophil (FIBERCON) 625 MG tablet Take 625 mg by mouth in the morning, at noon, and at bedtime.   Yes [provider]  sertraline  (ZOLOFT ) 50 MG tablet Take 50 mg by mouth at bedtime.   Yes [provider]  methocarbamol  (ROBAXIN ) 750 MG tablet Take 1 tablet (750 mg total) by mouth every 6 (six) hours as needed for muscle spasms. Patient not taking: Reported on 06/19/2023 02/19/23   Sherrilee Ileana PARAS, PA-C     Positive ROS: All other systems have been reviewed and were otherwise negative with the exception of those mentioned in the HPI and as above.  Physical Exam: General: Alert, no acute distress Cardiovascular: No pedal edema Respiratory: No cyanosis, no use of accessory musculature GI: No organomegaly, abdomen is soft and non-tender Skin: No lesions in the area of chief complaint Neurologic: Sensation intact distally Psychiatric: Patient is competent for consent with normal mood and affect Lymphatic: No axillary or cervical lymphadenopathy  MUSCULOSKELETAL: Left arm is in a brace. He has well-healed previous surgical wounds. He has severe atrophy of the deltoid. He has very little ability to abduct at all. All fingers flex, extend and abduct. Sensation is intact to all fingers. 2+ radial pulse.  X-rays taken 08/11/2023 demonstrate fractured proximal humerus component.    Assessment: Failed left long stem cemented hemiarthroplasty with distal nonunion and fracture with history of radial nerve palsy and elevated cobalt and chromium levels possibly secondary to metal on metal wear from the fractured humeral head.   Plan: This is a significant problem and we have discussed all options. He  definitely has significant risk factors for surgery, but we are going to plan to move forward with a removal of the humeral head piece with further assessment of wether the trunion can be removed intraoperatively.    The risks benefits and alternatives were discussed with the patient including but not limited to the risks of nonoperative treatment, versus surgical intervention including infection, bleeding, nerve injury,  blood clots, cardiopulmonary complications, morbidity, mortality, among others, and they were willing to proceed.    Sanjna Haskew K Astraea Gaughran, PA-C    10/12/2023 3:10 PM

## 2023-10-15 DIAGNOSIS — H6123 Impacted cerumen, bilateral: Secondary | ICD-10-CM | POA: Diagnosis not present

## 2023-10-15 DIAGNOSIS — G4733 Obstructive sleep apnea (adult) (pediatric): Secondary | ICD-10-CM | POA: Diagnosis not present

## 2023-10-15 DIAGNOSIS — C44321 Squamous cell carcinoma of skin of nose: Secondary | ICD-10-CM | POA: Diagnosis not present

## 2023-10-19 ENCOUNTER — Ambulatory Visit: Attending: Medical | Admitting: Medical

## 2023-10-19 VITALS — BP 128/75 | HR 63 | Ht 71.0 in | Wt 220.0 lb

## 2023-10-19 DIAGNOSIS — I10 Essential (primary) hypertension: Secondary | ICD-10-CM | POA: Diagnosis not present

## 2023-10-19 DIAGNOSIS — I480 Paroxysmal atrial fibrillation: Secondary | ICD-10-CM | POA: Diagnosis not present

## 2023-10-19 DIAGNOSIS — N1832 Chronic kidney disease, stage 3b: Secondary | ICD-10-CM

## 2023-10-19 DIAGNOSIS — I5032 Chronic diastolic (congestive) heart failure: Secondary | ICD-10-CM | POA: Diagnosis not present

## 2023-10-19 MED ORDER — FUROSEMIDE 20 MG PO TABS: 20.0000 mg | ORAL_TABLET | Freq: Every day | ORAL | 3 refills | Status: DC

## 2023-10-19 MED ORDER — FUROSEMIDE 20 MG PO TABS: 20.0000 mg | ORAL_TABLET | ORAL | 3 refills | Status: AC

## 2023-10-19 NOTE — Anesthesia Preprocedure Evaluation (Signed)
 Anesthesia Evaluation  Patient identified by MRN, date of birth, ID band Patient awake    Reviewed: Allergy & Precautions, NPO status , Patient's Chart, lab work & pertinent test results, reviewed documented beta blocker date and time   Airway Mallampati: II  TM Distance: >3 FB Neck ROM: Full    Dental  (+) Dental Advisory Given, Missing   Pulmonary sleep apnea and Continuous Positive Airway Pressure Ventilation , COPD,  COPD inhaler, former smoker   Pulmonary exam normal breath sounds clear to auscultation       Cardiovascular hypertension, Pt. on medications and Pt. on home beta blockers + Past MI and +CHF  Normal cardiovascular exam+ dysrhythmias (s/p ablation 2020) Atrial Fibrillation and Supra Ventricular Tachycardia  Rhythm:Regular Rate:Normal     Neuro/Psych  PSYCHIATRIC DISORDERS Anxiety Depression     Neuromuscular disease    GI/Hepatic Neg liver ROS,GERD  Medicated,,  Endo/Other  Hypothyroidism  Obesity   Renal/GU Renal InsufficiencyRenal disease     Musculoskeletal  (+) Arthritis ,    Abdominal   Peds  Hematology  (+) Blood dyscrasia, anemia H/o Non-Hodgkins Lymphoma    Anesthesia Other Findings Day of surgery medications reviewed with the patient.  Reproductive/Obstetrics                              Anesthesia Physical Anesthesia Plan  ASA: 3  Anesthesia Plan: General   Post-op Pain Management: Regional block* and Tylenol  PO (pre-op)*   Induction: Intravenous  PONV Risk Score and Plan: 2 and Dexamethasone  and Ondansetron   Airway Management Planned: Oral ETT  Additional Equipment:   Intra-op Plan:   Post-operative Plan: Extubation in OR  Informed Consent: I have reviewed the patients History and Physical, chart, labs and discussed the procedure including the risks, benefits and alternatives for the proposed anesthesia with the patient or authorized representative  who has indicated his/her understanding and acceptance.     Dental advisory given  Plan Discussed with:   Anesthesia Plan Comments: (See PAT note 10/07/2023)         Anesthesia Quick Evaluation

## 2023-10-19 NOTE — Patient Instructions (Signed)
 Medication Instructions:  Your physician recommends the following medication changes.  START TAKING: Lasix  20 mg by mouth once a week   *If you need a refill on your cardiac medications before your next appointment, please call your pharmacy*  Lab Work: No labs ordered today    Testing/Procedures: No test ordered today   Follow-Up: At [facility], you and your health needs are our priority.  As part of our continuing mission to provide you with exceptional heart care, our providers are all part of one team.  This team includes your primary Cardiologist (physician) and Advanced Practice Providers or APPs (Physician Assistants and Nurse Practitioners) who all work together to provide you with the care you need, when you need it.  Your next appointment:   3 month(s)  Provider:   Mikey Fishman, PA-C

## 2023-10-19 NOTE — Progress Notes (Signed)
 Cardiology Office Note   Date:  10/19/2023  ID:  Gavin Maxwell, DOB 1945/08/14, MRN [id number] PCP: Auston Reyes BIRCH, MD  [facility] Providers Cardiologist:  Evalene Lunger, MD   History of Present Illness Gavin Maxwell is a 78 y.o. 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   Patient was initially evaluated by heart care in 2020.  He was referred to EP for SVT.  He underwent ablation March 08, 2019.   Patient was diagnosed with new onset A-fib with RVR in February 2024.  Echo at that time showed EF of 60 to 65%, moderate concentric LVH.  TEE showed EF of 45 to 50%, mild MR.  Troponin peaked at greater than 2000.  He denied any chest pain.  He was successfully cardioverted to normal sinus rhythm.  He was started on amiodarone .  Myoview  Lexiscan  was low risk.  Echo showed EF of 45 to 50%.   The patient was hospitalized 3/4-3/7 with left hip dislocation. He underwent reduction.    The patient was last seen 06/2023 and was still recovering from left hip reduction. He reports volume overload and he took Lasix  3 times in the week.  He was instructed he take Lasix  for 3 days then go back down to 20 mg daily.  He was in normal sinus rhythm. Echo showed LVEF 55-60%, G1DD, mild MR.   Patient was last seen 07/17/2023 reporting improved swelling.  Recent kidney function showed dehydration and lasix  was stopped.   Today, the patient is overall doing well. He is going to have shoulder surgery tomorrow to remove hardware. He has been cleared for this and stopped Eliquis  3 days prior to surgery. Nephrology recommended lasix  once weekly. He is not wearing compression socks. He is elevating his feet. He denies chest pain or SOB.   Studies Reviewed EKG  Interpretation Date/Time:  Monday October 19 2023 14:20:44 EDT Ventricular Rate:  63 PR Interval:  246 QRS Duration:  124 QT Interval:  438 QTC Calculation: 448 R Axis:   -51  Text Interpretation: Sinus rhythm with 1st degree A-V block Left anterior fascicular block When compared with ECG of 19-Jun-2023 14:38, Sinus rhythm has replaced Atrial fibrillation Left anterior fascicular block is now Present T wave amplitude has increased in Lateral leads Confirmed by Franchester, Naziah Portee ([id number]) on 10/19/2023 2:32:56 PM    Echo 07/2023 1. Left ventricular ejection fraction, by estimation, is 55 to 60%. The  left ventricle has normal function. The left ventricle has no regional  wall motion abnormalities. Left ventricular diastolic parameters are  consistent with Grade I diastolic  dysfunction (impaired relaxation).   2. Right ventricular systolic function is normal. The right ventricular  size is normal.   3. Left atrial size was mildly dilated.   4. The mitral valve is normal in structure. Mild mitral valve  regurgitation. No evidence of mitral stenosis.   5. The aortic valve has an indeterminant number of cusps. Aortic valve  regurgitation is not visualized. No aortic stenosis is present.   6. The inferior vena cava is normal in size with greater than 50%  respiratory variability, suggesting right atrial pressure of 3 mmHg.    Myoview  Lexiscan  07/2022 Narrative & Impression      Normal pharmacologic myocardial perfusion stress test without evidence of significant  ischemia or scar.   Left ventricular systolic function is normal (LVEF > 55%).   Three-vessel coronary artery calcification noted on the attenuation correction CT.  There is also aortic atherosclerosis.   This is a low risk study.    Echo TEE 05/2022 1. Left ventricular ejection fraction, by estimation, is 45 to 50%. The  left ventricle has mildly decreased function. The left ventricle  demonstrates global hypokinesis. Left ventricular  diastolic parameters are  indeterminate.   2. Right ventricular systolic function is normal. The right ventricular  size is normal.   3. Left atrial size was moderately dilated. No left atrial/left atrial  appendage thrombus was detected.   4. The mitral valve is normal in structure. Mild mitral valve  regurgitation. No evidence of mitral stenosis.   5. The aortic valve is normal in structure. Aortic valve regurgitation is  not visualized. Aortic valve sclerosis is present, with no evidence of  aortic valve stenosis.   6. There is Moderate (Grade III) atheroma plaque involving the aortic  arch and descending aorta.   7. The inferior vena cava is normal in size with greater than 50%  respiratory variability, suggesting right atrial pressure of 3 mmHg.   8. Agitated saline contrast bubble study was negative, with no evidence  of any interatrial shunt.    Echo 05/2022 1. Left ventricular ejection fraction, by estimation, is 60 to 65%. The  left ventricle has normal function. The left ventricle has no regional  wall motion abnormalities. There is moderate concentric left ventricular  hypertrophy. Left ventricular  diastolic parameters are indeterminate.   2. Right ventricular systolic function is normal. The right ventricular  size is normal.   3. The mitral valve is normal in structure. Mild mitral valve  regurgitation. No evidence of mitral stenosis.   4. The aortic valve is normal in structure. Aortic valve regurgitation is  not visualized. Aortic valve sclerosis/calcification is present, without  any evidence of aortic stenosis.   5. The inferior vena cava is normal in size with greater than 50%  respiratory variability, suggesting right atrial pressure of 3 mmHg.    Physical Exam VS:  BP 128/75 (BP Location: Left Arm, Patient Position: Sitting, Cuff Size: Normal)   Pulse 63   Ht 5' 11 (1.803 m)   Wt 220 lb (99.8 kg)   SpO2 99%   BMI 30.68 kg/m        Wt Readings from Last  3 Encounters:  10/19/23 220 lb (99.8 kg)  10/07/23 219 lb (99.3 kg)  07/17/23 227 lb 6.4 oz (103.1 kg)    GEN: Well nourished, well developed in no acute distress NECK: No JVD; No carotid bruits CARDIAC: RRR, no murmurs, rubs, gallops RESPIRATORY:  Clear to auscultation without rales, wheezing or rhonchi  ABDOMEN: Soft, non-tender, non-distended EXTREMITIES:  No edema; No deformity   ASSESSMENT AND PLAN  Diastolic heart failure Lasix  daily caused dehydration. Nephrology recommended lasix  once weekly, which I agree with. He appears euvolemic on exam. He elevates his feet.Continue Farxiga  10mg  daily and Toprol  25mg  daily.   Paroxysmal Afib He is in NSR with 1st degree AV block. Continue Eliquis  2.5mg  BID-it was stopped in preparation for shoulder surgery tomorrow, restart post-procedure ASAP. Continue Toprol  25mg  daily and amiodarone  200mg  daily. Continue to monitor 1st degree block.   HTN BP today is good, continue current medications.   CKD stage 3 Patient follows with nephrology. Recent labs showed sCr 1.95, BUN 42, GFR 34.  Dispo: Follow-up in 3 months  Signed, Thadeus Gandolfi VEAR Fishman, PA-C

## 2023-10-19 NOTE — Progress Notes (Signed)
 Anesthesia Chart Review   Case: [id number] Date/Time: 10/20/23 1030   Procedure: REMOVAL, HARDWARE, SHOULDER, WITH IRRIGATION, DEBRIDEMENT, AND INSERTION OF ANTIBIOTIC BEADS OR ANTIBIOTIC SPACER (Left: Shoulder)   Anesthesia type: General   Diagnosis: Complications due to internal orthopedic device, implant, and graft (HCC) [U15.9XXA]   Pre-op diagnosis: Complications due to internal orthopedic device, implant, and graft   Location: [facility] ROOM 08 / [facility]   Surgeons: Josefina Chew, MD       DISCUSSION:78 y.o. 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 Chew Josefina.   Spinal cord stimulator in place, advised to bring remote.  S/p lumbar 3-5 fusion 2023.   Pt last seen by nephrology 09/10/2023. Per OV ntoe creatinine 2.01, overall stable.  Creatinine at PAT visit 1.95. Pt with lower extremity edema, advised to try compression socks, taking diuretic once per week.   Pt last seen by cardiology 10/19/2023. Per OV note pt euvolemic on exam. BP well controlled. Pt remains on toprol  and Eliquis  for a-fib, in NSR at this visit. Pt reports last dose of Eliquis  10/16/2023.   Per 09/04/23 cardiology note, Chart reviewed as part of pre-operative protocol coverage. Given past medical history and time since last visit, based on ACC/AHA guidelines, Abdurahman E Townley is at acceptable risk for the planned procedure without further cardiovascular testing.  VS: BP 131/79 Comment: right arm sitting  Pulse 62   Temp 36.7 C (Oral)   Resp 20   Ht 5' 11 (1.803 m)   Wt 99.3 kg   SpO2 100%   BMI 30.54 kg/m   PROVIDERS: Auston Reyes BIRCH, MD is PCP  Cardiologist - Evalene Lunger, MD EP- Soyla Norton, MD Nephrology- Dr. Dennise in [place] LABS: Labs  reviewed: Acceptable for surgery. (all labs ordered are listed, but only abnormal results are displayed)  Labs Reviewed  SURGICAL PCR SCREEN - Abnormal; Notable for the following components:      Result Value   MRSA, PCR POSITIVE (*)    Staphylococcus aureus POSITIVE (*)    All other components within normal limits  BASIC METABOLIC PANEL WITH GFR - Abnormal; Notable for the following components:   BUN 42 (*)    Creatinine, Ser 1.95 (*)    GFR, Estimated 34 (*)    All other components within normal limits  CBC - Abnormal; Notable for the following components:   RBC 3.65 (*)    Hemoglobin 11.9 (*)    HCT 37.0 (*)    MCV 101.4 (*)    All other components within normal limits     IMAGES:   EKG:   CV: Echo 07/15/2023 1. Left ventricular ejection fraction, by estimation, is 55 to 60%. The  left ventricle has normal function. The left ventricle has no regional  wall motion abnormalities. Left ventricular diastolic parameters are  consistent with Grade I diastolic  dysfunction (impaired relaxation).   2. Right ventricular systolic function is normal. The right ventricular  size is normal.   3. Left atrial size was mildly dilated.   4. The mitral valve is normal in structure. Mild mitral valve  regurgitation. No evidence of mitral stenosis.   5. The aortic valve has an indeterminant number of cusps. Aortic valve  regurgitation is not visualized. No aortic stenosis is present.   6. The inferior vena  cava is normal in size with greater than 50%  respiratory variability, suggesting right atrial pressure of 3 mmHg.   Myocardial Perfusion 07/22/2022   Normal pharmacologic myocardial perfusion stress test without evidence of significant ischemia or scar.   Left ventricular systolic function is normal (LVEF > 55%).   Three-vessel coronary artery calcification noted on the attenuation correction CT.  There is also aortic atherosclerosis.   This is a low risk study. Past Medical  History:  Diagnosis Date   Ambulates with cane    straight 4 prong cane   Anxiety    Cancer (HCC)    non hodgins  lymphoma   2004 in remission  Left arm  wears a brace there is a bone broken   CKD (chronic kidney disease) stage 3, GFR 30-59 ml/min (HCC)    COPD (chronic obstructive pulmonary disease) (HCC)    Depression    Dysrhythmia    SVT ,  A.fib   GERD (gastroesophageal reflux disease)    Hearing loss    wears hearing aids   History of hiatal hernia    Hypertension    Hypothyroidism    Pneumonia    x 1   Primary localized osteoarthritis of knee 09/26/2014   Primary localized osteoarthrosis, shoulder region, rotator cuff arthropathy 10/04/2013   Sleep apnea    uses cpap nightly    Past Surgical History:  Procedure Laterality Date   CARDIAC CATHETERIZATION     20 yrs. ago   CARDIOVERSION N/A 06/03/2022   Procedure: CARDIOVERSION;  Surgeon: Perla Evalene PARAS, MD;  Location: [facility];  Service: Cardiovascular;  Laterality: N/A;   CHOLECYSTECTOMY     EYE SURGERY Left    pt states he was seeing double and they did surgery to fix it   HIP CLOSED REDUCTION Left 06/09/2023   Procedure: CLOSED REDUCTION HIP, LEFT;  Surgeon: Beverley Evalene BIRCH, MD;  [facility];  Service: Orthopedics;  Laterality: Left;   NASAL SINUS SURGERY     x2   REVERSE SHOULDER ARTHROPLASTY Right 10/04/2013   Procedure: REVERSE SHOULDER ARTHROPLASTY;  Surgeon: [name], MD;  Location: [facility];  Service: Orthopedics;  Laterality: Right;   SHOULDER ACROMIOPLASTY Left    x 5 shoulder surgeries   SVT ABLATION N/A 03/07/2019   Procedure: SVT ABLATION;  Surgeon: Inocencio Soyla Lunger, MD;  [facility];  Service: Cardiovascular;  Laterality: N/A;   TEE WITHOUT CARDIOVERSION N/A 06/03/2022   Procedure: TRANSESOPHAGEAL ECHOCARDIOGRAM;  Surgeon: Perla Evalene PARAS, MD;  Location: [facility];  Service: Cardiovascular;  Laterality: N/A;   THORACIC LAMINECTOMY FOR SPINAL CORD STIMULATOR N/A  02/19/2023   Procedure: THORACIC LAMINECTOMY AND PLACEMENT OF SPINAL CORD STIMULATOR AND BATTERY;  Surgeon: Beuford Anes, MD;  [facility];  Service: Orthopedics;  Laterality: N/A;   TOTAL HIP ARTHROPLASTY Right 07/26/2019   Procedure: TOTAL HIP ARTHROPLASTY;  Surgeon: Olmsted Fonda, MD;  [facility];  Service: Orthopedics;  Laterality: Right;   TOTAL HIP ARTHROPLASTY Left 1998   TOTAL KNEE ARTHROPLASTY Right 09/26/2014   Procedure: RIGHT TOTAL KNEE ARTHROPLASTY;  Surgeon: Fonda Olmsted, MD;  [facility];  Service: Orthopedics;  Laterality: Right;   TRANSFORAMINAL LUMBAR INTERBODY FUSION (TLIF) WITH PEDICLE SCREW FIXATION 2 LEVEL Right 06/05/2021   Procedure: RIGHT-SIDED LUMBAR 3- LUMBAR 4, LUMBAR 4- LUMBAR 5 TRANSFORAMINAL LUMBAR INTERBODY FUSION AND DECOMPRESSION WITH INSTRUMENTATION AND ALLOGRAFT;  Surgeon: Beuford Anes, MD;  Location[facility];  Service: Orthopedics;  Laterality: Right;   VIDEO  ASSISTED THORACOSCOPY (VATS) W/TALC  PLEUADESIS Right 08/18/2017   Procedure: VIDEO ASSISTED THORACOSCOPY (VATS)POSSIBLE  W/TALC  PLEUADESIS.POSSIBLE BLEBECTOMY;  Surgeon: Volney Lye, MD;  Location: [facility];  Service: Thoracic;  Laterality: Right;    MEDICATIONS:  acetaminophen  (TYLENOL ) 500 MG tablet   albuterol  (PROVENTIL  HFA;VENTOLIN  HFA) 108 (90 Base) MCG/ACT inhaler   amiodarone  (PACERONE ) 200 MG tablet   apixaban  (ELIQUIS ) 2.5 MG TABS tablet   atorvastatin  (LIPITOR ) 80 MG tablet   brimonidine -timolol  (COMBIGAN ) 0.2-0.5 % ophthalmic solution   Calcium  Carb-Cholecalciferol  (CALCIUM  600 + D PO)   FARXIGA  10 MG TABS tablet   fenofibrate  160 MG tablet   ferrous sulfate  325 (65 FE) MG tablet   fluticasone  (FLONASE ) 50 MCG/ACT nasal spray   gabapentin  (NEURONTIN ) 300 MG capsule   imipramine  (TOFRANIL ) 25 MG tablet   levothyroxine  (SYNTHROID ) 50 MCG tablet   LUMIGAN 0.01 % SOLN   magnesium  oxide (MAG-OX) 400 MG tablet   methocarbamol  (ROBAXIN ) 750 MG tablet   metoprolol   succinate (TOPROL -XL) 25 MG 24 hr tablet   Multiple Vitamin (MULTIVITAMIN WITH MINERALS) TABS tablet   pantoprazole  (PROTONIX ) 40 MG tablet   polycarbophil (FIBERCON) 625 MG tablet   sertraline  (ZOLOFT ) 50 MG tablet   No current facility-administered medications for this encounter.    Harlene Hoots Ward, PA-C [facility] [phone]

## 2023-10-20 ENCOUNTER — Inpatient Hospital Stay (HOSPITAL_COMMUNITY): Payer: Self-pay | Admitting: Anesthesiology

## 2023-10-20 ENCOUNTER — Other Ambulatory Visit: Payer: Self-pay

## 2023-10-20 ENCOUNTER — Encounter (HOSPITAL_COMMUNITY)
Admission: RE | Disposition: A | Discharge status: Home / Self Care | Payer: Self-pay | Source: Home / Self Care | Attending: Orthopedic Surgery

## 2023-10-20 ENCOUNTER — Inpatient Hospital Stay (HOSPITAL_COMMUNITY)

## 2023-10-20 ENCOUNTER — Encounter (HOSPITAL_COMMUNITY): Payer: Self-pay | Admitting: Orthopedic Surgery

## 2023-10-20 ENCOUNTER — Inpatient Hospital Stay (HOSPITAL_COMMUNITY): Payer: Self-pay | Admitting: Physician Assistant

## 2023-10-20 ENCOUNTER — Inpatient Hospital Stay (HOSPITAL_COMMUNITY)
Admission: RE | Admit: 2023-10-20 | Discharge: 2023-10-21 | DRG: 496 | Disposition: A | Discharge status: Home / Self Care | Attending: Orthopedic Surgery | Admitting: Orthopedic Surgery

## 2023-10-20 DIAGNOSIS — J449 Chronic obstructive pulmonary disease, unspecified: Secondary | ICD-10-CM | POA: Diagnosis present

## 2023-10-20 DIAGNOSIS — E039 Hypothyroidism, unspecified: Secondary | ICD-10-CM | POA: Diagnosis not present

## 2023-10-20 DIAGNOSIS — I5031 Acute diastolic (congestive) heart failure: Secondary | ICD-10-CM

## 2023-10-20 DIAGNOSIS — T8489XA Other specified complication of internal orthopedic prosthetic devices, implants and grafts, initial encounter: Secondary | ICD-10-CM | POA: Diagnosis not present

## 2023-10-20 DIAGNOSIS — Z6830 Body mass index (BMI) 30.0-30.9, adult: Secondary | ICD-10-CM

## 2023-10-20 DIAGNOSIS — Y792 Prosthetic and other implants, materials and accessory orthopedic devices associated with adverse incidents: Secondary | ICD-10-CM | POA: Diagnosis present

## 2023-10-20 DIAGNOSIS — M9732XA Periprosthetic fracture around internal prosthetic left shoulder joint, initial encounter: Secondary | ICD-10-CM

## 2023-10-20 DIAGNOSIS — Z96612 Presence of left artificial shoulder joint: Secondary | ICD-10-CM | POA: Diagnosis present

## 2023-10-20 DIAGNOSIS — Y831 Surgical operation with implant of artificial internal device as the cause of abnormal reaction of the patient, or of later complication, without mention of misadventure at the time of the procedure: Secondary | ICD-10-CM | POA: Diagnosis present

## 2023-10-20 DIAGNOSIS — E669 Obesity, unspecified: Secondary | ICD-10-CM | POA: Diagnosis present

## 2023-10-20 DIAGNOSIS — F32A Depression, unspecified: Secondary | ICD-10-CM | POA: Diagnosis present

## 2023-10-20 DIAGNOSIS — I252 Old myocardial infarction: Secondary | ICD-10-CM | POA: Diagnosis not present

## 2023-10-20 DIAGNOSIS — G8918 Other acute postprocedural pain: Secondary | ICD-10-CM | POA: Diagnosis not present

## 2023-10-20 DIAGNOSIS — Z9889 Other specified postprocedural states: Principal | ICD-10-CM

## 2023-10-20 DIAGNOSIS — T84098A Other mechanical complication of other internal joint prosthesis, initial encounter: Secondary | ICD-10-CM | POA: Diagnosis not present

## 2023-10-20 DIAGNOSIS — Z7901 Long term (current) use of anticoagulants: Secondary | ICD-10-CM

## 2023-10-20 DIAGNOSIS — I130 Hypertensive heart and chronic kidney disease with heart failure and stage 1 through stage 4 chronic kidney disease, or unspecified chronic kidney disease: Secondary | ICD-10-CM

## 2023-10-20 DIAGNOSIS — T84018A Broken internal joint prosthesis, other site, initial encounter: Principal | ICD-10-CM | POA: Diagnosis present

## 2023-10-20 DIAGNOSIS — Z79890 Hormone replacement therapy (postmenopausal): Secondary | ICD-10-CM | POA: Diagnosis not present

## 2023-10-20 DIAGNOSIS — S42402A Unspecified fracture of lower end of left humerus, initial encounter for closed fracture: Secondary | ICD-10-CM | POA: Diagnosis not present

## 2023-10-20 DIAGNOSIS — Z87891 Personal history of nicotine dependence: Secondary | ICD-10-CM

## 2023-10-20 DIAGNOSIS — I4891 Unspecified atrial fibrillation: Secondary | ICD-10-CM | POA: Diagnosis not present

## 2023-10-20 DIAGNOSIS — I509 Heart failure, unspecified: Secondary | ICD-10-CM | POA: Diagnosis present

## 2023-10-20 DIAGNOSIS — G4730 Sleep apnea, unspecified: Secondary | ICD-10-CM | POA: Diagnosis present

## 2023-10-20 DIAGNOSIS — F419 Anxiety disorder, unspecified: Secondary | ICD-10-CM | POA: Diagnosis not present

## 2023-10-20 DIAGNOSIS — K219 Gastro-esophageal reflux disease without esophagitis: Secondary | ICD-10-CM | POA: Diagnosis present

## 2023-10-20 DIAGNOSIS — N183 Chronic kidney disease, stage 3 unspecified: Secondary | ICD-10-CM

## 2023-10-20 DIAGNOSIS — Z85831 Personal history of malignant neoplasm of soft tissue: Secondary | ICD-10-CM

## 2023-10-20 DIAGNOSIS — Z472 Encounter for removal of internal fixation device: Secondary | ICD-10-CM | POA: Diagnosis not present

## 2023-10-20 DIAGNOSIS — T849XXD Unspecified complication of internal orthopedic prosthetic device, implant and graft, subsequent encounter: Secondary | ICD-10-CM | POA: Diagnosis not present

## 2023-10-20 HISTORY — PX: EXCISIONAL TOTAL SHOULDER ARTHROPLASTY WITH ANTIBIOTIC SPACER: SHX6264

## 2023-10-20 SURGERY — REMOVAL, HARDWARE, SHOULDER, WITH IRRIGATION, DEBRIDEMENT, AND INSERTION OF ANTIBIOTIC BEADS OR ANTIBIOTIC SPACER
Anesthesia: General | Site: Shoulder | Laterality: Left

## 2023-10-20 MED ORDER — MENTHOL 3 MG MT LOZG: 1.0000 | LOZENGE | OROMUCOSAL | Status: DC | PRN

## 2023-10-20 MED ORDER — ONDANSETRON HCL 4 MG/2ML IJ SOLN
INTRAMUSCULAR | Status: DC | PRN
Start: 2023-10-20 — End: 2023-10-20
  Administered 2023-10-20: 4 mg via INTRAVENOUS

## 2023-10-20 MED ORDER — 0.9 % SODIUM CHLORIDE (POUR BTL) OPTIME
TOPICAL | Status: DC | PRN
  Administered 2023-10-20: 1000 mL

## 2023-10-20 MED ORDER — DAPAGLIFLOZIN PROPANEDIOL 10 MG PO TABS
10.0000 mg | ORAL_TABLET | Freq: Every day | ORAL | Status: DC
  Administered 2023-10-21: 10 mg via ORAL
  Filled 2023-10-20: qty 1

## 2023-10-20 MED ORDER — LIDOCAINE HCL (CARDIAC) PF 100 MG/5ML IV SOSY
PREFILLED_SYRINGE | INTRAVENOUS | Status: DC | PRN
  Administered 2023-10-20: 80 mg via INTRAVENOUS

## 2023-10-20 MED ORDER — BRIMONIDINE TARTRATE 0.2 % OP SOLN
1.0000 [drp] | Freq: Two times a day (BID) | OPHTHALMIC | Status: DC
  Administered 2023-10-20 – 2023-10-21 (×2): 1 [drp] via OPHTHALMIC
  Filled 2023-10-20: qty 5

## 2023-10-20 MED ORDER — METOCLOPRAMIDE HCL 5 MG/ML IJ SOLN: 5.0000 mg | Freq: Three times a day (TID) | INTRAMUSCULAR | Status: DC | PRN

## 2023-10-20 MED ORDER — TIMOLOL MALEATE 0.5 % OP SOLN
1.0000 [drp] | Freq: Two times a day (BID) | OPHTHALMIC | Status: DC
  Administered 2023-10-20 – 2023-10-21 (×2): 1 [drp] via OPHTHALMIC
  Filled 2023-10-20: qty 5

## 2023-10-20 MED ORDER — FERROUS SULFATE 325 (65 FE) MG PO TABS
325.0000 mg | ORAL_TABLET | Freq: Every day | ORAL | Status: DC
  Administered 2023-10-21: 325 mg via ORAL
  Filled 2023-10-20: qty 1

## 2023-10-20 MED ORDER — FENTANYL CITRATE PF 50 MCG/ML IJ SOSY
50.0000 ug | PREFILLED_SYRINGE | INTRAMUSCULAR | Status: DC
  Administered 2023-10-20: 50 ug via INTRAVENOUS
  Filled 2023-10-20: qty 2

## 2023-10-20 MED ORDER — FENTANYL CITRATE (PF) 250 MCG/5ML IJ SOLN
INTRAMUSCULAR | Status: DC | PRN
  Administered 2023-10-20: 50 ug via INTRAVENOUS

## 2023-10-20 MED ORDER — FENTANYL CITRATE PF 50 MCG/ML IJ SOSY: 25.0000 ug | PREFILLED_SYRINGE | INTRAMUSCULAR | Status: DC | PRN

## 2023-10-20 MED ORDER — SUGAMMADEX SODIUM 200 MG/2ML IV SOLN
INTRAVENOUS | Status: DC | PRN
Start: 2023-10-20 — End: 2023-10-20
  Administered 2023-10-20 (×2): 200 mg via INTRAVENOUS

## 2023-10-20 MED ORDER — ONDANSETRON 4 MG PO TBDP: 4.0000 mg | ORAL_TABLET | Freq: Three times a day (TID) | ORAL | 0 refills | Status: DC | PRN

## 2023-10-20 MED ORDER — VANCOMYCIN HCL 1000 MG IV SOLR
INTRAVENOUS | Status: DC | PRN
  Administered 2023-10-20: 1000 mg

## 2023-10-20 MED ORDER — BUPIVACAINE HCL (PF) 0.5 % IJ SOLN
INTRAMUSCULAR | Status: DC | PRN
  Administered 2023-10-20: 10 mL via PERINEURAL

## 2023-10-20 MED ORDER — CEFAZOLIN SODIUM-DEXTROSE 2-4 GM/100ML-% IV SOLN
2.0000 g | INTRAVENOUS | Status: AC
  Administered 2023-10-20: 2 g via INTRAVENOUS
  Filled 2023-10-20: qty 100

## 2023-10-20 MED ORDER — METOCLOPRAMIDE HCL 5 MG PO TABS: 5.0000 mg | ORAL_TABLET | Freq: Three times a day (TID) | ORAL | Status: DC | PRN

## 2023-10-20 MED ORDER — SENNA-DOCUSATE SODIUM 8.6-50 MG PO TABS: 2.0000 | ORAL_TABLET | Freq: Every day | ORAL | 1 refills | Status: DC

## 2023-10-20 MED ORDER — ACETAMINOPHEN 500 MG PO TABS
500.0000 mg | ORAL_TABLET | Freq: Four times a day (QID) | ORAL | Status: DC
  Administered 2023-10-21 (×2): 500 mg via ORAL
  Filled 2023-10-20 (×2): qty 1

## 2023-10-20 MED ORDER — LEVOTHYROXINE SODIUM 50 MCG PO TABS
50.0000 ug | ORAL_TABLET | Freq: Every day | ORAL | Status: DC
  Administered 2023-10-21: 50 ug via ORAL
  Filled 2023-10-20: qty 1

## 2023-10-20 MED ORDER — CHLORHEXIDINE GLUCONATE 0.12 % MT SOLN
15.0000 mL | Freq: Once | OROMUCOSAL | Status: AC
  Administered 2023-10-20: 15 mL via OROMUCOSAL

## 2023-10-20 MED ORDER — ALUM & MAG HYDROXIDE-SIMETH 200-200-20 MG/5ML PO SUSP: 30.0000 mL | ORAL | Status: DC | PRN

## 2023-10-20 MED ORDER — SERTRALINE HCL 50 MG PO TABS
50.0000 mg | ORAL_TABLET | Freq: Every day | ORAL | Status: DC
  Administered 2023-10-20: 50 mg via ORAL
  Filled 2023-10-20: qty 1

## 2023-10-20 MED ORDER — PHENYLEPHRINE HCL-NACL 20-0.9 MG/250ML-% IV SOLN
INTRAVENOUS | Status: AC
  Filled 2023-10-20: qty 250

## 2023-10-20 MED ORDER — POLYETHYLENE GLYCOL 3350 17 G PO PACK: 17.0000 g | PACK | Freq: Every day | ORAL | Status: DC | PRN

## 2023-10-20 MED ORDER — DOCUSATE SODIUM 100 MG PO CAPS
100.0000 mg | ORAL_CAPSULE | Freq: Two times a day (BID) | ORAL | Status: DC
  Administered 2023-10-20 – 2023-10-21 (×2): 100 mg via ORAL
  Filled 2023-10-20 (×2): qty 1

## 2023-10-20 MED ORDER — DEXAMETHASONE SODIUM PHOSPHATE 10 MG/ML IJ SOLN
INTRAMUSCULAR | Status: DC | PRN
  Administered 2023-10-20: 10 mg via INTRAVENOUS

## 2023-10-20 MED ORDER — ONDANSETRON HCL 4 MG PO TABS: 4.0000 mg | ORAL_TABLET | Freq: Four times a day (QID) | ORAL | Status: DC | PRN

## 2023-10-20 MED ORDER — VANCOMYCIN HCL 1000 MG IV SOLR
INTRAVENOUS | Status: AC
  Filled 2023-10-20: qty 20

## 2023-10-20 MED ORDER — TRANEXAMIC ACID-NACL 1000-0.7 MG/100ML-% IV SOLN
1000.0000 mg | INTRAVENOUS | Status: AC
  Administered 2023-10-20: 1000 mg via INTRAVENOUS
  Filled 2023-10-20: qty 100

## 2023-10-20 MED ORDER — IMIPRAMINE HCL 25 MG PO TABS
25.0000 mg | ORAL_TABLET | Freq: Every day | ORAL | Status: DC
  Administered 2023-10-20: 25 mg via ORAL
  Filled 2023-10-20: qty 1

## 2023-10-20 MED ORDER — MIDAZOLAM HCL 2 MG/2ML IJ SOLN: 1.0000 mg | INTRAMUSCULAR | Status: DC

## 2023-10-20 MED ORDER — LACTATED RINGERS IV SOLN: INTRAVENOUS | Status: DC

## 2023-10-20 MED ORDER — POVIDONE-IODINE 10 % EX SWAB: 2.0000 | Freq: Once | CUTANEOUS | Status: DC

## 2023-10-20 MED ORDER — METOPROLOL SUCCINATE ER 25 MG PO TB24
25.0000 mg | ORAL_TABLET | Freq: Every day | ORAL | Status: DC
  Administered 2023-10-21: 25 mg via ORAL
  Filled 2023-10-20: qty 1

## 2023-10-20 MED ORDER — BISACODYL 10 MG RE SUPP: 10.0000 mg | Freq: Every day | RECTAL | Status: DC | PRN

## 2023-10-20 MED ORDER — PROPOFOL 10 MG/ML IV BOLUS
INTRAVENOUS | Status: AC
  Filled 2023-10-20: qty 20

## 2023-10-20 MED ORDER — ACETAMINOPHEN 500 MG PO TABS
1000.0000 mg | ORAL_TABLET | Freq: Once | ORAL | Status: AC
  Administered 2023-10-20: 1000 mg via ORAL
  Filled 2023-10-20: qty 2

## 2023-10-20 MED ORDER — ALBUTEROL SULFATE (2.5 MG/3ML) 0.083% IN NEBU: 2.5000 mg | INHALATION_SOLUTION | Freq: Four times a day (QID) | RESPIRATORY_TRACT | Status: DC | PRN

## 2023-10-20 MED ORDER — ATORVASTATIN CALCIUM 40 MG PO TABS
80.0000 mg | ORAL_TABLET | Freq: Every day | ORAL | Status: DC
Start: 2023-10-20 — End: 2023-10-21
  Administered 2023-10-20: 80 mg via ORAL
  Filled 2023-10-20: qty 2

## 2023-10-20 MED ORDER — LACTATED RINGERS IV BOLUS
250.0000 mL | Freq: Once | INTRAVENOUS | Status: AC
  Administered 2023-10-20: 250 mL via INTRAVENOUS

## 2023-10-20 MED ORDER — TRAMADOL HCL 50 MG PO TABS: 50.0000 mg | ORAL_TABLET | Freq: Four times a day (QID) | ORAL | 0 refills | Status: DC | PRN

## 2023-10-20 MED ORDER — ACETAMINOPHEN 325 MG PO TABS
325.0000 mg | ORAL_TABLET | Freq: Four times a day (QID) | ORAL | Status: DC | PRN
  Administered 2023-10-20: 650 mg via ORAL
  Filled 2023-10-20: qty 2

## 2023-10-20 MED ORDER — EPHEDRINE 5 MG/ML INJ
INTRAVENOUS | Status: AC
Start: 2023-10-20 — End: 2023-10-20
  Filled 2023-10-20: qty 5

## 2023-10-20 MED ORDER — ONDANSETRON HCL 4 MG/2ML IJ SOLN: 4.0000 mg | Freq: Four times a day (QID) | INTRAMUSCULAR | Status: DC | PRN

## 2023-10-20 MED ORDER — FENOFIBRATE 160 MG PO TABS
160.0000 mg | ORAL_TABLET | Freq: Every day | ORAL | Status: DC
  Administered 2023-10-21: 160 mg via ORAL
  Filled 2023-10-20: qty 1

## 2023-10-20 MED ORDER — LACTATED RINGERS IV BOLUS: 500.0000 mL | Freq: Once | INTRAVENOUS | Status: DC

## 2023-10-20 MED ORDER — ORAL CARE MOUTH RINSE: 15.0000 mL | Freq: Once | OROMUCOSAL | Status: AC

## 2023-10-20 MED ORDER — ONDANSETRON HCL 4 MG/2ML IJ SOLN
INTRAMUSCULAR | Status: AC
  Filled 2023-10-20: qty 2

## 2023-10-20 MED ORDER — DEXAMETHASONE SODIUM PHOSPHATE 10 MG/ML IJ SOLN
INTRAMUSCULAR | Status: AC
  Filled 2023-10-20: qty 1

## 2023-10-20 MED ORDER — BUPIVACAINE LIPOSOME 1.3 % IJ SUSP
INTRAMUSCULAR | Status: DC | PRN
Start: 2023-10-20 — End: 2023-10-20
  Administered 2023-10-20: 10 mL via PERINEURAL

## 2023-10-20 MED ORDER — LIDOCAINE HCL (PF) 2 % IJ SOLN
INTRAMUSCULAR | Status: AC
  Filled 2023-10-20: qty 5

## 2023-10-20 MED ORDER — VANCOMYCIN HCL IN DEXTROSE 1-5 GM/200ML-% IV SOLN
1000.0000 mg | INTRAVENOUS | Status: AC
  Administered 2023-10-20: 1000 mg via INTRAVENOUS
  Filled 2023-10-20: qty 200

## 2023-10-20 MED ORDER — DIPHENHYDRAMINE HCL 12.5 MG/5ML PO ELIX: 12.5000 mg | ORAL_SOLUTION | ORAL | Status: DC | PRN

## 2023-10-20 MED ORDER — ROCURONIUM BROMIDE 10 MG/ML (PF) SYRINGE
PREFILLED_SYRINGE | INTRAVENOUS | Status: DC | PRN
  Administered 2023-10-20: 60 mg via INTRAVENOUS

## 2023-10-20 MED ORDER — GABAPENTIN 300 MG PO CAPS
300.0000 mg | ORAL_CAPSULE | Freq: Two times a day (BID) | ORAL | Status: DC
  Administered 2023-10-21: 300 mg via ORAL
  Filled 2023-10-20: qty 1

## 2023-10-20 MED ORDER — APIXABAN 2.5 MG PO TABS
2.5000 mg | ORAL_TABLET | Freq: Two times a day (BID) | ORAL | Status: DC
  Administered 2023-10-21: 2.5 mg via ORAL
  Filled 2023-10-20: qty 1

## 2023-10-20 MED ORDER — PROPOFOL 10 MG/ML IV BOLUS
INTRAVENOUS | Status: DC | PRN
  Administered 2023-10-20: 120 mg via INTRAVENOUS

## 2023-10-20 MED ORDER — EPHEDRINE SULFATE-NACL 50-0.9 MG/10ML-% IV SOSY
PREFILLED_SYRINGE | INTRAVENOUS | Status: DC | PRN
Start: 2023-10-20 — End: 2023-10-20
  Administered 2023-10-20: 10 mg via INTRAVENOUS

## 2023-10-20 MED ORDER — HYDROCODONE-ACETAMINOPHEN 5-325 MG PO TABS
1.0000 | ORAL_TABLET | ORAL | Status: DC | PRN
  Administered 2023-10-20: 1 via ORAL
  Administered 2023-10-21 (×2): 2 via ORAL
  Filled 2023-10-20: qty 2
  Filled 2023-10-20: qty 1
  Filled 2023-10-20 (×2): qty 2

## 2023-10-20 MED ORDER — PHENYLEPHRINE HCL-NACL 20-0.9 MG/250ML-% IV SOLN
INTRAVENOUS | Status: DC | PRN
Start: 2023-10-20 — End: 2023-10-20
  Administered 2023-10-20: 50 ug/min via INTRAVENOUS

## 2023-10-20 MED ORDER — CEFAZOLIN SODIUM-DEXTROSE 2-4 GM/100ML-% IV SOLN
2.0000 g | Freq: Four times a day (QID) | INTRAVENOUS | Status: AC
  Administered 2023-10-20 – 2023-10-21 (×2): 2 g via INTRAVENOUS
  Filled 2023-10-20 (×2): qty 100

## 2023-10-20 MED ORDER — ROCURONIUM BROMIDE 10 MG/ML (PF) SYRINGE
PREFILLED_SYRINGE | INTRAVENOUS | Status: AC
  Filled 2023-10-20: qty 10

## 2023-10-20 MED ORDER — MUPIROCIN 2 % EX OINT: 1.0000 | TOPICAL_OINTMENT | Freq: Two times a day (BID) | CUTANEOUS | 0 refills | Status: AC

## 2023-10-20 MED ORDER — AMIODARONE HCL 200 MG PO TABS
200.0000 mg | ORAL_TABLET | Freq: Every day | ORAL | Status: DC
  Administered 2023-10-21: 200 mg via ORAL
  Filled 2023-10-20: qty 1

## 2023-10-20 MED ORDER — ONDANSETRON HCL 4 MG/2ML IJ SOLN: 4.0000 mg | Freq: Once | INTRAMUSCULAR | Status: DC | PRN

## 2023-10-20 MED ORDER — HYDROCODONE-ACETAMINOPHEN 7.5-325 MG PO TABS: 1.0000 | ORAL_TABLET | ORAL | Status: DC | PRN

## 2023-10-20 MED ORDER — PHENOL 1.4 % MT LIQD: 1.0000 | OROMUCOSAL | Status: DC | PRN

## 2023-10-20 MED ORDER — MAGNESIUM CITRATE PO SOLN: 1.0000 | Freq: Once | ORAL | Status: DC | PRN

## 2023-10-20 MED ORDER — CHLORHEXIDINE GLUCONATE 4 % EX SOLN: 1.0000 | CUTANEOUS | 1 refills | Status: DC

## 2023-10-20 MED ORDER — MORPHINE SULFATE (PF) 2 MG/ML IV SOLN: 0.5000 mg | INTRAVENOUS | Status: DC | PRN

## 2023-10-20 MED ORDER — BRIMONIDINE TARTRATE-TIMOLOL 0.2-0.5 % OP SOLN: 1.0000 [drp] | Freq: Two times a day (BID) | OPHTHALMIC | Status: DC

## 2023-10-20 MED ORDER — FENTANYL CITRATE (PF) 100 MCG/2ML IJ SOLN
INTRAMUSCULAR | Status: AC
  Filled 2023-10-20: qty 2

## 2023-10-20 MED ORDER — SODIUM CHLORIDE 0.9 % IR SOLN
Status: DC | PRN
  Administered 2023-10-20: 1000 mL

## 2023-10-20 MED ORDER — PANTOPRAZOLE SODIUM 40 MG PO TBEC
40.0000 mg | DELAYED_RELEASE_TABLET | Freq: Every day | ORAL | Status: DC
  Administered 2023-10-21: 40 mg via ORAL
  Filled 2023-10-20: qty 1

## 2023-10-20 SURGICAL SUPPLY — 76 items
BAG DECANTER FOR FLEXI CONT (MISCELLANEOUS) IMPLANT
BAG ZIPLOCK 12X15 (MISCELLANEOUS) IMPLANT
BLADE SURG 15 STRL LF DISP TIS (BLADE) ×1 IMPLANT
BNDG COHESIVE 4X5 TAN STRL LF (GAUZE/BANDAGES/DRESSINGS) IMPLANT
BNDG COMPR ESMARK 4X3 LF (GAUZE/BANDAGES/DRESSINGS) IMPLANT
BNDG COMPR ESMARK 6X3 LF (GAUZE/BANDAGES/DRESSINGS) IMPLANT
BNDG ELASTIC 4INX 5YD STR LF (GAUZE/BANDAGES/DRESSINGS) IMPLANT
BNDG ELASTIC 6INX 5YD STR LF (GAUZE/BANDAGES/DRESSINGS) IMPLANT
CLSR STERI-STRIP ANTIMIC 1/2X4 (GAUZE/BANDAGES/DRESSINGS) IMPLANT
CNTNR URN SCR LID CUP LEK RST (MISCELLANEOUS) IMPLANT
CORD BIPOLAR FORCEPS 12FT (ELECTRODE) IMPLANT
COVER BACK TABLE 60X90IN (DRAPES) IMPLANT
CUFF TOURN SGL QUICK 18X4 (TOURNIQUET CUFF) IMPLANT
CUFF TRNQT CYL 34X4.125X (TOURNIQUET CUFF) IMPLANT
DRAPE EXTREMITY T 121X128X90 (DISPOSABLE) IMPLANT
DRAPE IMP U-DRAPE 54X76 (DRAPES) IMPLANT
DRAPE INCISE IOBAN 66X45 STRL (DRAPES) IMPLANT
DRAPE POUCH INSTRU U-SHP 10X18 (DRAPES) IMPLANT
DRAPE SHEET LG 3/4 BI-LAMINATE (DRAPES) IMPLANT
DRAPE SURG 17X23 STRL (DRAPES) IMPLANT
DRAPE SURG ORHT 6 SPLT 77X108 (DRAPES) IMPLANT
DRAPE U-SHAPE 47X51 STRL (DRAPES) IMPLANT
DRSG AQUACEL AG ADV 3.5X 4 (GAUZE/BANDAGES/DRESSINGS) IMPLANT
DRSG AQUACEL AG ADV 3.5X 6 (GAUZE/BANDAGES/DRESSINGS) IMPLANT
DURAPREP 26ML APPLICATOR (WOUND CARE) ×1 IMPLANT
ELECT BLADE TIP CTD 4 INCH (ELECTRODE) IMPLANT
ELECT REM PT RETURN 15FT ADLT (MISCELLANEOUS) IMPLANT
ELECTRODE REM PT RTRN 9FT ADLT (ELECTROSURGICAL) IMPLANT
GAUZE 4X4 16PLY ~~LOC~~+RFID DBL (SPONGE) ×1 IMPLANT
GAUZE SPONGE 4X4 12PLY STRL (GAUZE/BANDAGES/DRESSINGS) ×1 IMPLANT
GAUZE XEROFORM 1X8 LF (GAUZE/BANDAGES/DRESSINGS) IMPLANT
GLOVE BIO SURGEON STRL SZ 6.5 (GLOVE) ×1 IMPLANT
GLOVE BIOGEL PI IND STRL 7.0 (GLOVE) ×1 IMPLANT
GLOVE BIOGEL PI IND STRL 8 (GLOVE) ×1 IMPLANT
GLOVE ORTHO TXT STRL SZ7.5 (GLOVE) IMPLANT
GLOVE SURG ORTHO LTX SZ7.5 (GLOVE) ×1 IMPLANT
GOWN STRL REUS W/TWL LRG LVL3 (GOWN DISPOSABLE) ×2 IMPLANT
GOWN STRL SURGICAL XL XLNG (GOWN DISPOSABLE) IMPLANT
KIT BASIN OR (CUSTOM PROCEDURE TRAY) IMPLANT
KIT TURNOVER CYSTO (KITS) ×1 IMPLANT
KIT TURNOVER KIT A (KITS) IMPLANT
NDL HYPO 25X1 1.5 SAFETY (NEEDLE) IMPLANT
NEEDLE HYPO 25X1 1.5 SAFETY (NEEDLE) IMPLANT
NS IRRIG 1000ML POUR BTL (IV SOLUTION) ×1 IMPLANT
PACK BASIN DAY SURGERY FS (CUSTOM PROCEDURE TRAY) ×1 IMPLANT
PACK SHOULDER (CUSTOM PROCEDURE TRAY) IMPLANT
PAD CAST 4YDX4 CTTN HI CHSV (CAST SUPPLIES) IMPLANT
PADDING CAST COTTON 6X4 STRL (CAST SUPPLIES) IMPLANT
PENCIL SMOKE EVACUATOR (MISCELLANEOUS) IMPLANT
SET HNDPC FAN SPRY TIP SCT (DISPOSABLE) IMPLANT
SLEEVE SCD COMPRESS KNEE MED (STOCKING) ×2 IMPLANT
SLING ARM FOAM STRAP XLG (SOFTGOODS) IMPLANT
SPIKE FLUID TRANSFER (MISCELLANEOUS) IMPLANT
SPONGE T-LAP 4X18 ~~LOC~~+RFID (SPONGE) IMPLANT
STOCKINETTE 4X48 STRL (DRAPES) IMPLANT
STOCKINETTE 6 STRL (DRAPES) IMPLANT
STOCKINETTE IMPERVIOUS LG (DRAPES) IMPLANT
STRIP CLOSURE SKIN 1/2X4 (GAUZE/BANDAGES/DRESSINGS) IMPLANT
SUCTION TUBE FRAZIER 10FR DISP (SUCTIONS) IMPLANT
SUPPORT WRAP ARM LG (MISCELLANEOUS) IMPLANT
SUT ETHILON 3 0 PS 1 (SUTURE) IMPLANT
SUT MNCRL AB 3-0 PS2 18 (SUTURE) IMPLANT
SUT MNCRL AB 4-0 PS2 18 (SUTURE) IMPLANT
SUT VIC AB 0 CT1 27XBRD ANBCTR (SUTURE) IMPLANT
SUT VIC AB 0 CT1 36 (SUTURE) IMPLANT
SUT VIC AB 0 SH 27 (SUTURE) IMPLANT
SUT VIC AB 1 CT1 36 (SUTURE) IMPLANT
SUT VIC AB 2-0 SH 27XBRD (SUTURE) IMPLANT
SUT VIC AB 3-0 SH 8-18 (SUTURE) ×1 IMPLANT
SYR BULB EAR ULCER 3OZ GRN STR (SYRINGE) ×1 IMPLANT
SYR CONTROL 10ML LL (SYRINGE) IMPLANT
TOWEL OR 17X24 6PK STRL BLUE (TOWEL DISPOSABLE) ×1 IMPLANT
TUBE CONNECTING 12X1/4 (SUCTIONS) IMPLANT
TUBE SUCTION HIGH CAP CLEAR NV (SUCTIONS) IMPLANT
UNDERPAD 30X36 HEAVY ABSORB (UNDERPADS AND DIAPERS) ×1 IMPLANT
YANKAUER SUCT BULB TIP NO VENT (SUCTIONS) IMPLANT

## 2023-10-20 NOTE — Interval H&P Note (Signed)
 History and Physical Interval Note:  10/20/2023 11:33 AM  Gavin Maxwell  has presented today for surgery, with the diagnosis of Complications due to internal orthopedic device, implant, and graft.  The various methods of treatment have been discussed with the patient and family. After consideration of risks, benefits and other options for treatment, the patient has consented to  left shoulder removal of broken hardware as a surgical intervention.  The patient's history has been reviewed, patient examined, no change in status, stable for surgery.  I have reviewed the patient's chart and labs.  Questions were answered to the patient's satisfaction.     Gavin Maxwell

## 2023-10-20 NOTE — Discharge Instructions (Addendum)
 Diet: As you were doing prior to hospitalization   Shower:  May shower but keep the wounds dry, use an occlusive plastic wrap, NO SOAKING IN TUB.  You dressing is water  resistant.   Dressing:  Keep dressing in place until follow-up visit.   Activity:  Increase activity slowly as tolerated, but follow the weight bearing instructions below.  The rules on driving is that you can not be taking narcotics while you drive, and you must feel in control of the vehicle.    Weight Bearing:  weight bearing as tolerated with left arm. Sling is for comfort only.   To prevent constipation: you may use a stool softener such as -  Colace (over the counter) 100 mg by mouth twice a day  Drink plenty of fluids (prune juice may be helpful) and high fiber foods Miralax  (over the counter) for constipation as needed.    Itching:  If you experience itching with your medications, try taking only a single pain pill, or even half a pain pill at a time.  You may take up to 10 pain pills per day, and you can also use benadryl  over the counter for itching or also to help with sleep.   Precautions:  If you experience chest pain or shortness of breath - call 911 immediately for transfer to the hospital emergency department!!  If you develop a fever greater that 101 F, purulent drainage from wound, increased redness or drainage from wound, or calf pain -- Call the office at [phone]                                                Follow- Up Appointment:  Please call for an appointment to be seen in 2 weeks [place] - [phone]

## 2023-10-20 NOTE — Anesthesia Postprocedure Evaluation (Signed)
 Anesthesia Post Note  Patient: Gavin Maxwell  Procedure(s) Performed: REMOVAL, HARDWARE, SHOULDER, WITH IRRIGATION, DEBRIDEMENT, AND INSERTION OF ANTIBIOTIC BEADS OR ANTIBIOTIC SPACER (Left: Shoulder)     Patient location during evaluation: PACU Anesthesia Type: General Level of consciousness: awake and alert Pain management: pain level controlled Vital Signs Assessment: post-procedure vital signs reviewed and stable Respiratory status: spontaneous breathing, nonlabored ventilation, respiratory function stable and patient connected to nasal cannula oxygen  Cardiovascular status: blood pressure returned to baseline and stable Postop Assessment: no apparent nausea or vomiting Anesthetic complications: no   No notable events documented.  Last Vitals:  Vitals:   10/20/23 1430 10/20/23 1514  BP: (!) 145/82 (!) 142/73  Pulse: (!) 58 (!) 57  Resp: 15 16  Temp:  36.6 C  SpO2: 99% 96%    Last Pain:  Vitals:   10/20/23 1514  TempSrc: Oral  PainSc:                  Garnette FORBES Skillern

## 2023-10-20 NOTE — Progress Notes (Signed)
   10/20/23 2216  BiPAP/CPAP/SIPAP  BiPAP/CPAP/SIPAP Pt Type Adult  Reason BIPAP/CPAP not in use Non-compliant (pt states he prob wont sleep much tonight and dont wish to wear CPAP)

## 2023-10-20 NOTE — Anesthesia Procedure Notes (Signed)
 Anesthesia Regional Block: Interscalene brachial plexus block   Pre-Anesthetic Checklist: , timeout performed,  Correct Patient, Correct Site, Correct Laterality,  Correct Procedure, Correct Position, site marked,  Risks and benefits discussed,  Surgical consent,  Pre-op evaluation,  At surgeon's request and post-op pain management  Laterality: Left  Prep: chloraprep       Needles:  Injection technique: Single-shot  Needle Type: Echogenic Stimulator Needle     Needle Length: 9cm  Needle Gauge: 22     Additional Needles:   Procedures:,,,, ultrasound used (permanent image in chart),,    Narrative:  Start time: 10/20/2023 9:37 AM End time: 10/20/2023 9:45 AM Injection made incrementally with aspirations every 5 mL.  Performed by: Personally  Anesthesiologist: Corinne Garnette BRAVO, MD  Additional Notes: Functioning IV was confirmed and monitors were applied.  A 90mm 22ga echogenic stimulator needle was used. Sterile prep and drape, hand hygiene, and sterile gloves were used.  Negative aspiration and negative test dose prior to incremental administration of local anesthetic. The patient tolerated the procedure well.  Ultrasound guidance: relevent anatomy identified, needle position confirmed, local anesthetic spread visualized around nerve(s), vascular puncture avoided.  Image printed for medical record.

## 2023-10-20 NOTE — Transfer of Care (Signed)
 Immediate Anesthesia Transfer of Care Note  Patient: Corinthia FORBES Cohens  Procedure(s) Performed: REMOVAL, HARDWARE, SHOULDER, WITH IRRIGATION, DEBRIDEMENT, AND INSERTION OF ANTIBIOTIC BEADS OR ANTIBIOTIC SPACER (Left: Shoulder)  Patient Location: PACU  Anesthesia Type:GA combined with regional for post-op pain  Level of Consciousness: awake, sedated, and patient cooperative  Airway & Oxygen  Therapy: Patient Spontanous Breathing and Patient connected to face mask oxygen   Post-op Assessment: Report given to RN and Post -op Vital signs reviewed and stable  Post vital signs: Reviewed and stable  Last Vitals:  Vitals Value Taken Time  BP 160/86 10/20/23 13:11  Temp    Pulse 59 10/20/23 13:13  Resp 18 10/20/23 13:13  SpO2 100 % 10/20/23 13:13  Vitals shown include unfiled device data.  Last Pain:  Vitals:   10/20/23 0941  TempSrc:   PainSc: 0-No pain         Complications: No notable events documented.

## 2023-10-20 NOTE — Op Note (Signed)
 10/20/2023  1:04 PM  PATIENT:  Gavin Maxwell    PRE-OPERATIVE DIAGNOSIS: Left shoulder prosthetic fracture with hardware degradation and failure  POST-OPERATIVE DIAGNOSIS:  Same  PROCEDURE: Left shoulder removal of hardware, deep prosthetic head with excisional debridement of skin, subcutaneous tissue, fascia, and capsule    SURGEON:  [name], MD  PHYSICIAN ASSISTANT: Army Daring, PA-C, present and scrubbed throughout the case, critical for completion in a timely fashion, and for retraction, instrumentation, and closure.  ANESTHESIA:   General  PREOPERATIVE INDICATIONS:  Gavin Maxwell is a  78 y.o. 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  He had preoperative serum levels that were elevated for cobalt chrome and chromium, and felt that this was potentially coming from the shoulder and he elected for removal of the metallic fracture device.  The risks benefits and alternatives were discussed with the patient preoperatively including but not limited to the risks of infection, bleeding, nerve injury, cardiopulmonary complications, the need for revision surgery, among others, and the patient was willing to proceed.  ESTIMATED BLOOD LOSS: 100 mL  OPERATIVE IMPLANTS:   * No implants in log *  OPERATIVE FINDINGS: There were no clinical signs of infection.  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  There was a section of consolidated metallic debris that was conformed to the capsular shape itself.  The metallic head was disconnected and was able to be removed.  OPERATIVE PROCEDURE: The patient was brought to the operating room placed in supine  position.  General anesthesia was administered.  The left upper extremity was prepped and draped in usual sterile fashion.  Timeout performed.  Incision was made through the skin and dissection carried down to the deltopectoral interval, this was identified and there was some previous tagging stitches in that location.  The interval was opened, and then inspected, and it appeared that the head ball was fractured.  I exposed the head ball, and then was able to extricate this with a little bit of difficulty.  There was a 3 x 3 cm of consolidated metallic debris that also came out as a single piece/chunk of material.  The remaining synovium and capsule were all completely black, I used a rongeur to debride this is much as possible but really could not make much headway, and I felt that surgical excision of the capsule would risk vascular injury that would be unwarranted.  Therefore I completed the excisional debridement of the skin, subcutaneous tissue and fascia, irrigated the wounds with almost 3 L of fluid, placed vancomycin  powder, closed the deltopectoral interval loosely, and then closed the skin with nylon sutures.  He was awakened and returned to the PACU in stable and satisfactory condition.  There were no complications and he tolerated the procedure well.  This was effectively a Girdlestone procedure with removal of the proximal head prosthesis.  [name], MD

## 2023-10-20 NOTE — Anesthesia Procedure Notes (Signed)
 Procedure Name: Intubation Date/Time: 10/20/2023 11:56 AM  Performed by: Dasie Nena PARAS, CRNAPre-anesthesia Checklist: Patient identified, Emergency Drugs available, Suction available, Patient being monitored and Timeout performed Patient Re-evaluated:Patient Re-evaluated prior to induction Oxygen  Delivery Method: Circle system utilized Preoxygenation: Pre-oxygenation with 100% oxygen  Induction Type: IV induction Ventilation: Mask ventilation without difficulty Laryngoscope Size: Miller and 3 Grade View: Grade I Tube type: Oral Tube size: 7.5 mm Number of attempts: 1 Airway Equipment and Method: Stylet Placement Confirmation: ETT inserted through vocal cords under direct vision, positive ETCO2 and breath sounds checked- equal and bilateral Secured at: 23 cm Tube secured with: Tape Dental Injury: Teeth and Oropharynx as per pre-operative assessment

## 2023-10-21 ENCOUNTER — Encounter (HOSPITAL_COMMUNITY): Payer: Self-pay | Admitting: Orthopedic Surgery

## 2023-10-21 LAB — CBC
HCT: 31.5 % — ABNORMAL LOW (ref 39.0–52.0)
Hemoglobin: 10.3 g/dL — ABNORMAL LOW (ref 13.0–17.0)
MCH: 33 pg (ref 26.0–34.0)
MCHC: 32.7 g/dL (ref 30.0–36.0)
MCV: 101 fL — ABNORMAL HIGH (ref 80.0–100.0)
Platelets: 214 K/uL (ref 150–400)
RBC: 3.12 MIL/uL — ABNORMAL LOW (ref 4.22–5.81)
RDW: 13.6 % (ref 11.5–15.5)
WBC: 4.8 K/uL (ref 4.0–10.5)
nRBC: 0 % (ref 0.0–0.2)

## 2023-10-21 LAB — BASIC METABOLIC PANEL WITH GFR
Anion gap: 9 (ref 5–15)
BUN: 33 mg/dL — ABNORMAL HIGH (ref 8–23)
CO2: 21 mmol/L — ABNORMAL LOW (ref 22–32)
Calcium: 8.4 mg/dL — ABNORMAL LOW (ref 8.9–10.3)
Chloride: 101 mmol/L (ref 98–111)
Creatinine, Ser: 2.01 mg/dL — ABNORMAL HIGH (ref 0.61–1.24)
GFR, Estimated: 33 mL/min — ABNORMAL LOW (ref >60)
Glucose, Bld: 120 mg/dL — ABNORMAL HIGH (ref 70–99)
Potassium: 4.4 mmol/L (ref 3.5–5.1)
Sodium: 131 mmol/L — ABNORMAL LOW (ref 135–145)

## 2023-10-21 NOTE — Progress Notes (Signed)
   10/21/23 1025  TOC Brief Assessment  Insurance and Status Reviewed  Patient has primary care physician Yes  Home environment has been reviewed resides in private residence  Prior level of function: Independent  Prior/Current Home Services No current home services  Social Drivers of Health Review SDOH reviewed no interventions necessary  Readmission risk has been reviewed Yes  Transition of care needs no transition of care needs at this time

## 2023-10-21 NOTE — Plan of Care (Signed)
   Problem: Activity: Goal: Risk for activity intolerance will decrease Outcome: Progressing   Problem: Pain Managment: Goal: General experience of comfort will improve and/or be controlled Outcome: Progressing   Problem: Safety: Goal: Ability to remain free from injury will improve Outcome: Progressing

## 2023-10-21 NOTE — Progress Notes (Signed)
 Subjective: 1 Day Post-Op s/p Procedure(s): REMOVAL, HARDWARE, SHOULDER, WITH IRRIGATION, DEBRIDEMENT, AND INSERTION OF ANTIBIOTIC BEADS OR ANTIBIOTIC SPACER   Patient is alert, oriented. Pain was severe overnight, mainly because he is used to sleeping on his left side due to back pain. Pain better now that he is sitting up in the chair.  No other complaints.   Objective:  PE: VITALS:   Vitals:   10/20/23 1934 10/21/23 0114 10/21/23 0556 10/21/23 0950  BP: 135/70 (!) 101/59 125/66 109/65  Pulse: 66 62 (!) 54 65  Resp: 17 15 17 16   Temp: 97.7 F (36.5 C) 97.6 F (36.4 C) 98 F (36.7 C) (!) 97.5 F (36.4 C)  TempSrc: Oral  Oral   SpO2: 99% 93% 98% 99%  Weight:      Height:       Sitting up in chair eating breakfast in no acute distress LUE in sling. Able flex, extend, and abduct all fingers of left hand.  Dressing CDI 2+ radial pulse Sensation intact to all fingers   LABS  Results for orders placed or performed during the hospital encounter of 10/20/23 (from the past 24 hours)  Aerobic/Anaerobic Culture w Gram Stain (surgical/deep wound)     Status: None (Preliminary result)   Collection Time: 10/20/23 12:33 PM   Specimen: PATH Soft tissue  Result Value Ref Range   Specimen Description      TISSUE Performed at [facility], [address] [place], [place] [address]    Special Requests      NONE Performed at [facility], [address] [place], [place] [address]    Gram Stain      ABUNDANT WBC PRESENT, PREDOMINANTLY PMN NO ORGANISMS SEEN    Culture      NO GROWTH < 24 HOURS Performed at [facility], [address], [place], [place] [address]    Report Status PENDING   CBC     Status: Abnormal   Collection Time: 10/21/23  3:52 AM  Result Value Ref Range   WBC 4.8 4.0 - 10.5 K/uL   RBC 3.12 (L) 4.22 - 5.81 MIL/uL   Hemoglobin 10.3 (L) 13.0 - 17.0 g/dL   HCT 68.4 (L) 60.9 - 47.9 %   MCV 101.0 (H) 80.0 -  100.0 fL   MCH 33.0 26.0 - 34.0 pg   MCHC 32.7 30.0 - 36.0 g/dL   RDW 86.3 88.4 - 84.4 %   Platelets 214 150 - 400 K/uL   nRBC 0.0 0.0 - 0.2 %  Basic metabolic panel     Status: Abnormal   Collection Time: 10/21/23  3:52 AM  Result Value Ref Range   Sodium 131 (L) 135 - 145 mmol/L   Potassium 4.4 3.5 - 5.1 mmol/L   Chloride 101 98 - 111 mmol/L   CO2 21 (L) 22 - 32 mmol/L   Glucose, Bld 120 (H) 70 - 99 mg/dL   BUN 33 (H) 8 - 23 mg/dL   Creatinine, Ser 7.98 (H) 0.61 - 1.24 mg/dL   Calcium  8.4 (L) 8.9 - 10.3 mg/dL   GFR, Estimated 33 (L) >60 mL/min   Anion gap 9 5 - 15    DG Shoulder Left Port Result Date: 10/20/2023 CLINICAL DATA:  Hardware removal EXAM: LEFT SHOULDER COMPARISON:  Left shoulder radiograph dated 03/26/2007 FINDINGS: Status post hardware removal of the left shoulder. Residual intramedullary rod within the humeral diaphysis. Chronic fracture deformity of the distal humeral  diaphysis. Subcutaneous emphysema and edema projecting over the glenohumeral joint. IMPRESSION: Status post hardware removal of the left shoulder. Electronically Signed   By: Limin  Xu M.D.   On: 10/20/2023 15:59    Assessment/Plan: Principal Problem:   S/P hardware removal    1 Day Post-Op s/p Procedure(s): REMOVAL, HARDWARE, SHOULDER,   Weightbearing: WBAT LUE, no ROM restrictions Insicional and dressing care: Reinforce dressings as needed Orthopedic device(s): sling for comfort only VTE prophylaxis: home eliquis  restarted Pain control: continue current regimen Follow - up plan: 2 weeks with Dr. Josefina Dispo: plan to discharge home today after working with OT   Contact information:   Army Daring, PA-C Weekdays 8-5  After hours and holidays please check [email or web address] for group call information for [facility]  Army MARLA Daring 10/21/2023, 11:50 AM

## 2023-10-21 NOTE — Progress Notes (Signed)
Discharge instructions given to patient and wife, questions asked and answered.Sharrell Ku Zyara Riling RN

## 2023-10-21 NOTE — Evaluation (Signed)
 Occupational Therapy Evaluation Patient Details Name: Gavin Maxwell MRN: [id number] DOB: 02/13/1946 Today's Date: 10/21/2023   History of Present Illness   ANDERSSON LARRABEE is a  78 y.o. 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  He had preoperative serum levels that were elevated for cobalt chrome and chromium, and felt that this was potentially coming from the shoulder and he elected for removal of the metallic fracture device. PMH: CA, COPD, Depression, HOH, HTN, PNA, hypothyroidism, OA, OSA     Clinical Impressions PTA pt lives at home with wife at [facility] I level with RW and SPC interchangeably.  Education completed regarding compensatory strategies for ADL tasks and functional mobility, management of sling, L ROM per specified parameters in the order set as indicated below, positioning of operative arm in sitting and supine and edema control, including use of regular ice packs.  Caregiver present for education, written handouts provided and reviewed using Teach Back and pt/caregiver verbalized/demonstrated understanding. Due to the below listed deficits, pt requires minimal assistance with ADL tasks and supervision on level surfaces with functional mobility. Caregiver will be able to provide necessary level of assistance at discharge. Pt to follow up with MD to progress rehab of the operative shoulder.      If plan is discharge home, recommend the following:   A little help with walking and/or transfers;A little help with bathing/dressing/bathroom;Assistance with cooking/housework;Assist for transportation;Help with stairs or ramp for entrance     Functional Status Assessment   Patient has had a recent decline in their functional status and demonstrates  the ability to make significant improvements in function in a reasonable and predictable amount of time.     Equipment Recommendations   None recommended by OT      Precautions/Restrictions   Precautions Precautions: Shoulder Type of Shoulder Precautions: ok for ROM as per PA notes L UU OT orders state Ok for full ROM at all aspects of LUE, has minimal function of left shoulder at baseline  Required Braces or Orthoses: Sling (for comfort) Restrictions Weight Bearing Restrictions Per Provider Order: Yes LUE Weight Bearing Per Provider Order: Non weight bearing Other Position/Activity Restrictions: ok for as tolerated L shoulder as per PA note     Mobility Bed Mobility Overal bed mobility: Modified Independent                  Transfers Overall transfer level: Needs assistance Equipment used: Rolling walker (2 wheels) Transfers: Sit to/from Stand, Bed to chair/wheelchair/BSC Sit to Stand: Supervision     Step pivot transfers: Supervision     General transfer comment: amb to and from bathroom with RW with supervision and wife educated on use of gait belt with + teach back      Balance Overall balance assessment: Mild deficits observed, not formally tested (uses RW and SPC at baseline)                                         ADL either performed or assessed with clinical judgement   ADL Overall ADL's : Needs assistance/impaired    Per orders, L shoulder parameters as follows for ADL tasks:Ok for full ROM at  all aspects of LUE, has minimal function of left shoulder at baseline instructed as per OT orders set; elbow/wrist/hand ROM only. While moving within specified parameters, pt/caregiver instructed on bathing and how to donn/doff shirt, placing operative arm through sleeve first when donning and off last when doffing.Pt/caregiver educated on compensatory strategies for LB ADL and strategies to reduce risk of falls.  Pt/caregiver educated on  donning/doffing sling and to wear the sling at all times with the exception of ADL, and to loosen the neck strap of the sling when the operative arm is in a supported position when sitting. In sitting or supine, pt instructed to have a pillow behind and under their operative arm to provide support. If assist needed with ambulation, caregiver educated on the importance of walking on pt's non-operative side.  Education regarding use of ice packs completed, including the importance of using a barrier on the shoulder prior to positioning the packs. Pt/caregiver verbalized/demonstrated understanding. Teach Back used while caregiver assisted with dressing pt and positioning of sling and ice pack to facilitate DC.                                          Vision Baseline Vision/History: 1 Wears glasses Ability to See in Adequate Light: 0 Adequate Patient Visual Report: No change from baseline Vision Assessment?: No apparent visual deficits;Wears glasses for reading            Pertinent Vitals/Pain Pain Assessment Pain Assessment: 0-10 Pain Score: 3  Pain Location: L UE Pain Descriptors / Indicators: Aching Pain Intervention(s): Premedicated before session, Repositioned, Ice applied, Relaxation     Extremity/Trunk Assessment Upper Extremity Assessment Upper Extremity Assessment: Left hand dominant;LUE deficits/detail LUE Deficits / Details: baseline L shoulder ROM deficits LUE:  (L AAROM elbow, wrist and hand WFL's) LUE Sensation: WNL   Lower Extremity Assessment Lower Extremity Assessment: Overall WFL for tasks assessed   Cervical / Trunk Assessment Cervical / Trunk Assessment: Normal   Communication Communication Communication: No apparent difficulties   Cognition Arousal: Alert Behavior During Therapy: WFL for tasks assessed/performed Cognition: No apparent impairments                               Following commands: Intact        Cueing  General Comments   Cueing Techniques: Verbal cues  L shoulder post op dressing in place, ice packs applied on top of shirt   Exercises Exercises: Shoulder Shoulder Exercises Elbow Flexion: AAROM, 10 reps Elbow Extension: AAROM, 10 reps Wrist Flexion: AAROM, 10 reps Wrist Extension: AAROM, 10 reps Digit Composite Flexion: AROM, 10 reps Composite Extension: AROM, 10 reps   Shoulder Instructions Shoulder Instructions Donning/doffing shirt without moving shoulder: Caregiver independent with task;Patient able to independently direct caregiver Method for sponge bathing under operated UE: Caregiver independent with task;Patient able to independently direct caregiver Donning/doffing sling/immobilizer: Caregiver independent with task;Patient able to independently direct caregiver Correct positioning of sling/immobilizer: Caregiver independent with task;Patient able to independently direct caregiver ROM for elbow, wrist and digits of operated UE: Caregiver independent with task;Patient able to independently direct caregiver Sling wearing schedule (on at all times/off for ADL's): Caregiver independent with task;Patient able to independently direct caregiver Proper positioning of operated UE when showering: Caregiver independent with task;Patient able to independently direct caregiver Positioning of UE while sleeping: Caregiver independent  with task;Patient able to independently direct caregiver    Home Living Family/patient expects to be discharged to:: Private residence Living Arrangements: Spouse/significant other;Children Available Help at Discharge: Family;Available 24 hours/day Type of Home: House Home Access: Stairs to enter [employer] of Steps: 1 (grabs door frame/door) Entrance Stairs-Rails: None Home Layout: One level     Bathroom Shower/Tub: Walk-in shower;Door         Home Equipment: Agricultural consultant (2 wheels);Cane - single point;Shower seat;Grab bars -  tub/shower;Adaptive equipment;Hand held shower head Adaptive Equipment: Reacher;Sock aid        Prior Functioning/Environment Prior Level of Function : Independent/Modified Independent             Mobility Comments: Pt ambulatory with SPC or RW; liked to go to gym (dislocated hip at gym) ADLs Comments: Independent with adls and iadls    OT Problem List: Impaired UE functional use    AM-PAC OT 6 Clicks Daily Activity     Outcome Measure Help from another person eating meals?: A Little Help from another person taking care of personal grooming?: A Little Help from another person toileting, which includes using toliet, bedpan, or urinal?: A Little Help from another person bathing (including washing, rinsing, drying)?: A Little Help from another person to put on and taking off regular upper body clothing?: A Little Help from another person to put on and taking off regular lower body clothing?: A Little 6 Click Score: 18   End of Session Equipment Utilized During Treatment: Gait belt;Rolling walker (2 wheels);Other (comment) (sling L UE) Nurse Communication: Mobility status  Activity Tolerance: Patient tolerated treatment well Patient left: in chair;with call bell/phone within reach;with family/visitor present  OT Visit Diagnosis: Other (comment) (L UE dysfuntion)                Time: 8869-8784 OT Time Calculation (min): 45 min Charges:  OT General Charges $OT Visit: 1 Visit OT Evaluation $OT Eval Low Complexity: 1 Low OT Treatments $Self Care/Home Management : 23-37 mins Jamerica Snavely OT/L Acute Rehabilitation Department  [phone]  10/21/2023, 12:30 PM

## 2023-10-25 LAB — AEROBIC/ANAEROBIC CULTURE W GRAM STAIN (SURGICAL/DEEP WOUND): Culture: NO GROWTH

## 2023-10-28 NOTE — Discharge Summary (Signed)
 Discharge Summary  Patient ID: Gavin Maxwell MRN: [id number] DOB/AGE: 10-26-1945 78 y.o.  Admit date: 10/20/2023 Discharge date: 10/21/2023  Admission Diagnoses:  S/P hardware removal  Discharge Diagnoses:  Principal Problem:   S/P hardware removal   Past Medical History:  Diagnosis Date   Ambulates with cane    straight 4 prong cane   Anxiety    Cancer (HCC)    non hodgins  lymphoma   2004 in remission  Left arm  wears a brace there is a bone broken   CKD (chronic kidney disease) stage 3, GFR 30-59 ml/min (HCC)    COPD (chronic obstructive pulmonary disease) (HCC)    Depression    Dysrhythmia    SVT ,  A.fib   GERD (gastroesophageal reflux disease)    Hearing loss    wears hearing aids   History of hiatal hernia    Hypertension    Hypothyroidism    Pneumonia    x 1   Primary localized osteoarthritis of knee 09/26/2014   Primary localized osteoarthrosis, shoulder region, rotator cuff arthropathy 10/04/2013   Sleep apnea    uses cpap nightly    Surgeries: Procedure(s): REMOVAL, HARDWARE, SHOULDER, WITH IRRIGATION, DEBRIDEMENT, AND INSERTION OF ANTIBIOTIC BEADS OR ANTIBIOTIC SPACER on 10/20/2023   Consultants (if any):   Discharged Condition: Improved  Hospital Course: Gavin Maxwell is an 78 y.o. male who was admitted 10/20/2023 with a diagnosis of S/P hardware removal and went to the operating room on 10/20/2023 and underwent the above named procedures.    He was given perioperative antibiotics:  Anti-infectives (From admission, onward)    Start     Dose/Rate Route Frequency Ordered Stop   10/20/23 1800  ceFAZolin  (ANCEF ) IVPB 2g/100 mL premix        2 g 200 mL/hr over 30 Minutes Intravenous Every 6 hours 10/20/23 1518 10/21/23 0040   10/20/23 1226  vancomycin  (VANCOCIN ) powder  Status:  Discontinued          As needed 10/20/23 1226 10/20/23 1309   10/20/23 0815  ceFAZolin  (ANCEF ) IVPB 2g/100 mL premix        2 g 200 mL/hr over 30 Minutes Intravenous On call  to O.R. 10/20/23 0809 10/20/23 1213   10/20/23 0815  vancomycin  (VANCOCIN ) IVPB 1000 mg/200 mL premix        1,000 mg 200 mL/hr over 60 Minutes Intravenous On call to O.R. 10/20/23 0809 10/20/23 1138     .  He was given sequential compression devices and early ambulation for DVT prophylaxis.  He benefited maximally from the hospital stay and there were no complications.    Recent vital signs:  Vitals:   10/21/23 0556 10/21/23 0950  BP: 125/66 109/65  Pulse: (!) 54 65  Resp: 17 16  Temp: 98 F (36.7 C) (!) 97.5 F (36.4 C)  SpO2: 98% 99%    Recent laboratory studies:  Lab Results  Component Value Date   HGB 10.3 (L) 10/21/2023   HGB 11.9 (L) 10/07/2023   HGB 10.2 (L) 06/11/2023   Lab Results  Component Value Date   WBC 4.8 10/21/2023   PLT 214 10/21/2023   Lab Results  Component Value Date   INR 1.4 (H) 05/30/2023   Lab Results  Component Value Date   NA 131 (L) 10/21/2023   K 4.4 10/21/2023   CL 101 10/21/2023   CO2 21 (L) 10/21/2023   BUN 33 (H) 10/21/2023   CREATININE 2.01 (H) 10/21/2023   GLUCOSE  120 (H) 10/21/2023    Discharge Medications:   Allergies as of 10/21/2023       Reactions   Amoxicillin-pot Clavulanate Nausea Only, Other (See Comments)   Did it involve swelling of the face/tongue/throat, SOB, or low BP? No Did it involve sudden or severe rash/hives, skin peeling, or any reaction on the inside of your mouth or nose? No Did you need to seek medical attention at a hospital or doctor's office? No When did it last happen? More than 10 years If all above answers are NO, may proceed with cephalosporin use. Did it involve swelling of the face/tongue/throat, SOB, or low BP? No Did it involve sudden or severe rash/hives, skin peeling, or any reaction on the inside of your mouth or nose? No Did you need to seek medical attention at a hospital or doctor's office? No When did it last happen? More than 10 years If all above answers are NO, may proceed  with cephalosporin use.   Celecoxib Nausea Only, Other (See Comments)   Upset stomach   Oxycodone  Other (See Comments)   Confusion, makes him crazy Confusion, makes him crazy    Confusion, makes him crazy Confusion, makes him crazy        Medication List     TAKE these medications    acetaminophen  500 MG tablet Commonly known as: TYLENOL  Take 1,000 mg by mouth every 6 (six) hours as needed for moderate pain (pain score 4-6).   albuterol  108 (90 Base) MCG/ACT inhaler Commonly known as: VENTOLIN  HFA Inhale 2 puffs into the lungs every 6 (six) hours as needed for wheezing or shortness of breath.   amiodarone  200 MG tablet Commonly known as: PACERONE  Take 1 tablet by mouth once daily   apixaban  2.5 MG Tabs tablet Commonly known as: Eliquis  Take 1 tablet (2.5 mg total) by mouth 2 (two) times daily.   atorvastatin  80 MG tablet Commonly known as: LIPITOR  Take 1 tablet by mouth once daily What changed: when to take this   brimonidine -timolol  0.2-0.5 % ophthalmic solution Commonly known as: COMBIGAN  Place 1 drop into both eyes every 12 (twelve) hours.   CALCIUM  600 + D PO Take 1 tablet by mouth in the morning.   chlorhexidine  4 % external liquid Commonly known as: HIBICLENS  Apply 15 mLs (1 Application total) topically as directed for 30 doses. Use as directed daily for 5 days every other week for 6 weeks.   Farxiga  10 MG Tabs tablet Generic drug: dapagliflozin  propanediol Take 1 tablet (10 mg total) by mouth daily.   fenofibrate  160 MG tablet Take 160 mg by mouth in the morning.   ferrous sulfate  325 (65 FE) MG tablet Take 325 mg by mouth in the morning.   fluticasone  50 MCG/ACT nasal spray Commonly known as: FLONASE  Place 2 sprays into both nostrils daily.   furosemide  20 MG tablet Commonly known as: LASIX  Take 1 tablet (20 mg total) by mouth once a week.   gabapentin  300 MG capsule Commonly known as: NEURONTIN  Take 300 mg by mouth 2 (two) times  daily.   imipramine  25 MG tablet Commonly known as: TOFRANIL  Take 25 mg by mouth at bedtime.   levothyroxine  50 MCG tablet Commonly known as: SYNTHROID  Take 50 mcg by mouth daily before breakfast.   Lumigan 0.01 % Soln Generic drug: bimatoprost Place 1 drop into both eyes at bedtime.   magnesium  oxide 400 MG tablet Commonly known as: MAG-OX Take 400 mg by mouth in the morning and at bedtime.  With supper and at bedtime   methocarbamol  750 MG tablet Commonly known as: ROBAXIN  Take 1 tablet (750 mg total) by mouth every 6 (six) hours as needed for muscle spasms.   metoprolol  succinate 25 MG 24 hr tablet Commonly known as: TOPROL -XL Take 1 tablet by mouth in the evening What changed: when to take this   multivitamin with minerals Tabs tablet Take 1 tablet by mouth in the morning.   mupirocin  ointment 2 % Commonly known as: BACTROBAN  Place 1 Application into the nose 2 (two) times daily for 60 doses. Use as directed 2 times daily for 5 days every other week for 6 weeks.   ondansetron  4 MG disintegrating tablet Commonly known as: ZOFRAN -ODT Take 1 tablet (4 mg total) by mouth every 8 (eight) hours as needed for nausea or vomiting.   pantoprazole  40 MG tablet Commonly known as: PROTONIX  Take 40 mg by mouth in the morning.   polycarbophil 625 MG tablet Commonly known as: FIBERCON Take 625 mg by mouth in the morning, at noon, and at bedtime.   sennosides-docusate sodium  8.6-50 MG tablet Commonly known as: SENOKOT-S Take 2 tablets by mouth daily. While taking pain medication. For constipation.   sertraline  50 MG tablet Commonly known as: ZOLOFT  Take 50 mg by mouth at bedtime.   traMADol  50 MG tablet Commonly known as: Ultram  Take 1 tablet (50 mg total) by mouth every 6 (six) hours as needed for severe pain (pain score 7-10) or moderate pain (pain score 4-6).        Diagnostic Studies: DG Shoulder Left Port Result Date: 10/20/2023 CLINICAL DATA:  Hardware removal  EXAM: LEFT SHOULDER COMPARISON:  Left shoulder radiograph dated 03/26/2007 FINDINGS: Status post hardware removal of the left shoulder. Residual intramedullary rod within the humeral diaphysis. Chronic fracture deformity of the distal humeral diaphysis. Subcutaneous emphysema and edema projecting over the glenohumeral joint. IMPRESSION: Status post hardware removal of the left shoulder. Electronically Signed   By: Limin  Xu M.D.   On: 10/20/2023 15:59    Disposition: Discharge disposition: 01-Home or Self Care          Follow-up Information     Josefina Chew, MD. Schedule an appointment as soon as possible for a visit in 2 week(s).   Specialty: Orthopedic Surgery Contact information: [address] [place] [address] [phone]                  Signed: [employer] MARLA Delores DEVONNA 10/28/2023, 3:21 PM

## 2023-11-04 DIAGNOSIS — T849XXD Unspecified complication of internal orthopedic prosthetic device, implant and graft, subsequent encounter: Secondary | ICD-10-CM | POA: Diagnosis not present

## 2023-11-05 DIAGNOSIS — G4733 Obstructive sleep apnea (adult) (pediatric): Secondary | ICD-10-CM | POA: Diagnosis not present

## 2023-11-17 DIAGNOSIS — Z1331 Encounter for screening for depression: Secondary | ICD-10-CM | POA: Diagnosis not present

## 2023-11-17 DIAGNOSIS — Z79899 Other long term (current) drug therapy: Secondary | ICD-10-CM | POA: Diagnosis not present

## 2023-11-17 DIAGNOSIS — Z125 Encounter for screening for malignant neoplasm of prostate: Secondary | ICD-10-CM | POA: Diagnosis not present

## 2023-11-17 DIAGNOSIS — E039 Hypothyroidism, unspecified: Secondary | ICD-10-CM | POA: Diagnosis not present

## 2023-11-17 DIAGNOSIS — Z0000 Encounter for general adult medical examination without abnormal findings: Secondary | ICD-10-CM | POA: Diagnosis not present

## 2023-11-17 DIAGNOSIS — Z1211 Encounter for screening for malignant neoplasm of colon: Secondary | ICD-10-CM | POA: Diagnosis not present

## 2023-11-17 DIAGNOSIS — F411 Generalized anxiety disorder: Secondary | ICD-10-CM | POA: Diagnosis not present

## 2023-11-17 DIAGNOSIS — N183 Chronic kidney disease, stage 3 unspecified: Secondary | ICD-10-CM | POA: Diagnosis not present

## 2023-11-17 DIAGNOSIS — E782 Mixed hyperlipidemia: Secondary | ICD-10-CM | POA: Diagnosis not present

## 2023-11-17 DIAGNOSIS — I480 Paroxysmal atrial fibrillation: Secondary | ICD-10-CM | POA: Diagnosis not present

## 2023-11-17 DIAGNOSIS — I10 Essential (primary) hypertension: Secondary | ICD-10-CM | POA: Diagnosis not present

## 2023-11-23 DIAGNOSIS — C44321 Squamous cell carcinoma of skin of nose: Secondary | ICD-10-CM | POA: Diagnosis not present

## 2023-11-23 DIAGNOSIS — L578 Other skin changes due to chronic exposure to nonionizing radiation: Secondary | ICD-10-CM | POA: Diagnosis not present

## 2023-11-23 DIAGNOSIS — L814 Other melanin hyperpigmentation: Secondary | ICD-10-CM | POA: Diagnosis not present

## 2023-12-03 DIAGNOSIS — Z1211 Encounter for screening for malignant neoplasm of colon: Secondary | ICD-10-CM | POA: Diagnosis not present

## 2023-12-04 DIAGNOSIS — M25512 Pain in left shoulder: Secondary | ICD-10-CM | POA: Diagnosis not present

## 2023-12-06 DIAGNOSIS — G4733 Obstructive sleep apnea (adult) (pediatric): Secondary | ICD-10-CM | POA: Diagnosis not present

## 2023-12-17 DIAGNOSIS — Z1211 Encounter for screening for malignant neoplasm of colon: Secondary | ICD-10-CM | POA: Diagnosis not present

## 2023-12-23 DIAGNOSIS — N183 Chronic kidney disease, stage 3 unspecified: Secondary | ICD-10-CM | POA: Diagnosis not present

## 2023-12-23 DIAGNOSIS — Z79899 Other long term (current) drug therapy: Secondary | ICD-10-CM | POA: Diagnosis not present

## 2024-01-05 DIAGNOSIS — G4733 Obstructive sleep apnea (adult) (pediatric): Secondary | ICD-10-CM | POA: Diagnosis not present

## 2024-01-13 DIAGNOSIS — J40 Bronchitis, not specified as acute or chronic: Secondary | ICD-10-CM | POA: Diagnosis not present

## 2024-01-13 DIAGNOSIS — I480 Paroxysmal atrial fibrillation: Secondary | ICD-10-CM | POA: Diagnosis not present

## 2024-01-13 DIAGNOSIS — N183 Chronic kidney disease, stage 3 unspecified: Secondary | ICD-10-CM | POA: Diagnosis not present

## 2024-01-19 ENCOUNTER — Encounter: Payer: Self-pay | Admitting: Medical

## 2024-01-19 ENCOUNTER — Ambulatory Visit: Attending: Medical | Admitting: Medical

## 2024-01-19 VITALS — BP 116/73 | HR 61 | Ht 71.0 in | Wt 218.0 lb

## 2024-01-19 DIAGNOSIS — N1832 Chronic kidney disease, stage 3b: Secondary | ICD-10-CM

## 2024-01-19 DIAGNOSIS — I10 Essential (primary) hypertension: Secondary | ICD-10-CM

## 2024-01-19 DIAGNOSIS — I5032 Chronic diastolic (congestive) heart failure: Secondary | ICD-10-CM | POA: Diagnosis not present

## 2024-01-19 DIAGNOSIS — I480 Paroxysmal atrial fibrillation: Secondary | ICD-10-CM

## 2024-01-19 NOTE — Progress Notes (Signed)
 Cardiology Office Note   Date:  01/19/2024  ID:  Gavin Maxwell, DOB May 05, 1945, MRN [id number] PCP: Auston Reyes BIRCH, MD  [facility] Providers Cardiologist:  Evalene Lunger, MD   History of Present Illness Gavin Maxwell is a 78 y.o. 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   Patient was initially evaluated by heart care in 2020.  He was referred to EP for SVT.  He underwent ablation March 08, 2019.   Patient was diagnosed with new onset A-fib with RVR in February 2024.  Echo at that time showed EF of 60 to 65%, moderate concentric LVH.  TEE showed EF of 45 to 50%, mild MR.  Troponin peaked at greater than 2000.  He denied any chest pain.  He was successfully cardioverted to normal sinus rhythm.  He was started on amiodarone .  Myoview  Lexiscan  was low risk.  Echo showed EF of 45 to 50%.   The patient was hospitalized 3/4-3/7 with left hip dislocation. He underwent reduction. The patient was seen 06/2023 and was still recovering from left hip reduction. He reports volume overload and he took Lasix  3 times in the week.  He was instructed he take Lasix  for 3 days then go back down to 20 mg daily.  He was in normal sinus rhythm. Echo showed LVEF 55-60%, G1DD, mild MR.  Repeat kidney function showed dehydration and lasix  was stopped.   The patient was last seen 10/2023 and was overall doing well. He was needing shoulder surgery.   Today, the patient is overall doing well. He is still recovering from shoulder surgery. EKG shows NSR. He denies chest pain, SOB, lower leg edema. He denies any bleeding issues.   Studies Reviewed      Echo 07/2023 1. Left ventricular ejection fraction, by estimation, is 55 to 60%. The  left ventricle has  normal function. The left ventricle has no regional  wall motion abnormalities. Left ventricular diastolic parameters are  consistent with Grade I diastolic  dysfunction (impaired relaxation).   2. Right ventricular systolic function is normal. The right ventricular  size is normal.   3. Left atrial size was mildly dilated.   4. The mitral valve is normal in structure. Mild mitral valve  regurgitation. No evidence of mitral stenosis.   5. The aortic valve has an indeterminant number of cusps. Aortic valve  regurgitation is not visualized. No aortic stenosis is present.   6. The inferior vena cava is normal in size with greater than 50%  respiratory variability, suggesting right atrial pressure of 3 mmHg.    Myoview  Lexiscan  07/2022 Narrative & Impression      Normal pharmacologic myocardial perfusion stress test without evidence of significant ischemia or scar.   Left ventricular systolic function is normal (LVEF > 55%).   Three-vessel coronary artery calcification noted on the attenuation correction CT.  There is also aortic atherosclerosis.   This is a low risk study.    Echo TEE 05/2022 1. Left ventricular ejection fraction, by estimation, is 45 to 50%. The  left ventricle has mildly decreased function. The left ventricle  demonstrates global hypokinesis. Left ventricular diastolic parameters are  indeterminate.   2. Right ventricular systolic function is normal. The right ventricular  size is normal.   3. Left atrial size was  moderately dilated. No left atrial/left atrial  appendage thrombus was detected.   4. The mitral valve is normal in structure. Mild mitral valve  regurgitation. No evidence of mitral stenosis.   5. The aortic valve is normal in structure. Aortic valve regurgitation is  not visualized. Aortic valve sclerosis is present, with no evidence of  aortic valve stenosis.   6. There is Moderate (Grade III) atheroma plaque involving the aortic  arch and descending  aorta.   7. The inferior vena cava is normal in size with greater than 50%  respiratory variability, suggesting right atrial pressure of 3 mmHg.   8. Agitated saline contrast bubble study was negative, with no evidence  of any interatrial shunt.    Echo 05/2022 1. Left ventricular ejection fraction, by estimation, is 60 to 65%. The  left ventricle has normal function. The left ventricle has no regional  wall motion abnormalities. There is moderate concentric left ventricular  hypertrophy. Left ventricular  diastolic parameters are indeterminate.   2. Right ventricular systolic function is normal. The right ventricular  size is normal.   3. The mitral valve is normal in structure. Mild mitral valve  regurgitation. No evidence of mitral stenosis.   4. The aortic valve is normal in structure. Aortic valve regurgitation is  not visualized. Aortic valve sclerosis/calcification is present, without  any evidence of aortic stenosis.   5. The inferior vena cava is normal in size with greater than 50%  respiratory variability, suggesting right atrial pressure of 3 mmHg.       Physical Exam VS:  BP 116/73   Pulse 61   Ht 5' 11 (1.803 m)   Wt 218 lb (98.9 kg)   SpO2 96%   BMI 30.40 kg/m        Wt Readings from Last 3 Encounters:  01/19/24 218 lb (98.9 kg)  10/20/23 220 lb (99.8 kg)  10/19/23 220 lb (99.8 kg)    GEN: Well nourished, well developed in no acute distress NECK: No JVD; No carotid bruits CARDIAC: RRR, no murmurs, rubs, gallops RESPIRATORY:  Clear to auscultation without rales, wheezing or rhonchi  ABDOMEN: Soft, non-tender, non-distended EXTREMITIES:  No edema; No deformity   ASSESSMENT AND PLAN  Diastolic heart failure The patient is euvolemic on exam.  He takes Lasix  20 mg once a week per nephrology.  Echocardiogram earlier this year showed EF 55 to 60%, grade 1 diastolic dysfunction, mild MR.  Continue Toprol  25 mg daily and Farxiga  10 mg daily.  Paroxysmal  Afib The patient is in NSR on EKG. Continue Eliquis  5mg  BID for stroke ppx. Continue amiodarone  200mg  daily and toprol  25mg  daily for rate/rhythm control. He had hemoccult positive in August and repeat hemoccult negative a couple weeks later. Hgb remains stable 11-12. He denies any BRBPR or dark stools. We will continue to monitor CBC on Eliquis .   HTN BP today is good, continue Toprol .   CKD stage 3 Patient follows with nephrology. Recent labs showed Scr 2.1       Dispo: Follow-up in 6 months  Signed, Essie Gehret VEAR Fishman, PA-C

## 2024-01-19 NOTE — Patient Instructions (Signed)
 Medication Instructions:  Your physician recommends that you continue on your current medications as directed. Please refer to the Current Medication list given to you today.    *If you need a refill on your cardiac medications before your next appointment, please call your pharmacy*  Lab Work: No labs ordered today    Testing/Procedures: No test ordered today   Follow-Up: At [facility], you and your health needs are our priority.  As part of our continuing mission to provide you with exceptional heart care, our providers are all part of one team.  This team includes your primary Cardiologist (physician) and Advanced Practice Providers or APPs (Physician Assistants and Nurse Practitioners) who all work together to provide you with the care you need, when you need it.  Your next appointment:   6 month(s)  Provider:   Timothy Gollan, MD or Cadence Franchester, PA-C

## 2024-02-19 DIAGNOSIS — Z125 Encounter for screening for malignant neoplasm of prostate: Secondary | ICD-10-CM | POA: Diagnosis not present

## 2024-02-19 DIAGNOSIS — I10 Essential (primary) hypertension: Secondary | ICD-10-CM | POA: Diagnosis not present

## 2024-02-19 DIAGNOSIS — E039 Hypothyroidism, unspecified: Secondary | ICD-10-CM | POA: Diagnosis not present

## 2024-02-19 DIAGNOSIS — E782 Mixed hyperlipidemia: Secondary | ICD-10-CM | POA: Diagnosis not present

## 2024-02-19 DIAGNOSIS — N183 Chronic kidney disease, stage 3 unspecified: Secondary | ICD-10-CM | POA: Diagnosis not present

## 2024-02-19 DIAGNOSIS — Z79899 Other long term (current) drug therapy: Secondary | ICD-10-CM | POA: Diagnosis not present

## 2024-02-19 DIAGNOSIS — F411 Generalized anxiety disorder: Secondary | ICD-10-CM | POA: Diagnosis not present

## 2024-02-19 DIAGNOSIS — I480 Paroxysmal atrial fibrillation: Secondary | ICD-10-CM | POA: Diagnosis not present

## 2024-02-19 DIAGNOSIS — D649 Anemia, unspecified: Secondary | ICD-10-CM | POA: Diagnosis not present

## 2024-02-23 ENCOUNTER — Other Ambulatory Visit: Payer: Self-pay | Admitting: Cardiovascular Disease

## 2024-02-27 ENCOUNTER — Other Ambulatory Visit: Payer: Self-pay | Admitting: Cardiovascular Disease

## 2024-03-08 DIAGNOSIS — N1832 Chronic kidney disease, stage 3b: Secondary | ICD-10-CM | POA: Diagnosis not present

## 2024-03-08 DIAGNOSIS — H401133 Primary open-angle glaucoma, bilateral, severe stage: Secondary | ICD-10-CM | POA: Diagnosis not present

## 2024-03-08 DIAGNOSIS — D631 Anemia in chronic kidney disease: Secondary | ICD-10-CM | POA: Diagnosis not present

## 2024-03-08 DIAGNOSIS — R600 Localized edema: Secondary | ICD-10-CM | POA: Diagnosis not present

## 2024-03-14 DIAGNOSIS — N184 Chronic kidney disease, stage 4 (severe): Secondary | ICD-10-CM | POA: Diagnosis not present

## 2024-03-14 DIAGNOSIS — I129 Hypertensive chronic kidney disease with stage 1 through stage 4 chronic kidney disease, or unspecified chronic kidney disease: Secondary | ICD-10-CM | POA: Diagnosis not present

## 2024-03-14 DIAGNOSIS — D631 Anemia in chronic kidney disease: Secondary | ICD-10-CM | POA: Diagnosis not present

## 2024-03-14 DIAGNOSIS — R809 Proteinuria, unspecified: Secondary | ICD-10-CM | POA: Diagnosis not present

## 2024-03-14 DIAGNOSIS — R600 Localized edema: Secondary | ICD-10-CM | POA: Diagnosis not present

## 2024-03-15 DIAGNOSIS — H401133 Primary open-angle glaucoma, bilateral, severe stage: Secondary | ICD-10-CM | POA: Diagnosis not present

## 2024-03-15 DIAGNOSIS — H2513 Age-related nuclear cataract, bilateral: Secondary | ICD-10-CM | POA: Diagnosis not present

## 2024-03-16 ENCOUNTER — Telehealth: Payer: Self-pay | Admitting: Cardiovascular Disease

## 2024-03-16 NOTE — Telephone Encounter (Signed)
 Patient with diagnosis of afib on Eliquis  for anticoagulation.    Procedure: Cataract extraction with a glaucoma surgery  Date of procedure: 03/23/24   CHA2DS2-VASc Score = 5   This indicates a 7.2% annual risk of stroke. The patient's score is based upon: CHF History: 1 HTN History: 1 Diabetes History: 0 Stroke History: 0 Vascular Disease History: 1 Age Score: 2 Gender Score: 0      CrCl 31 ml/min Platelet count 245  Patient has not had an Afib/aflutter ablation in the last 3 months, DCCV within the last 4 weeks or a watchman implanted in the last 45 days   Per office protocol, patient can hold Eliquis  for 2 day prior to procedure.    **This guidance is not considered finalized until pre-operative APP has relayed final recommendations.**

## 2024-03-16 NOTE — Telephone Encounter (Signed)
° °  Pre-operative Risk Assessment    Patient Name: Gavin Maxwell  DOB: 1946-02-19 MRN: [id number]   Date of last office visit: 01/19/2024 Date of next office visit: n/a   Request for Surgical Clearance    Procedure:  Cataract extraction with a glaucoma surgery  Date of Surgery:  Clearance 03/23/24                                Surgeon:  Dr. Dene Etienne Surgeon's Group or Practice Name:  [facility] Phone number:  [phone] Fax number:  [phone]   Type of Clearance Requested:   - Pharmacy:  Hold Apixaban  (Eliquis ) 2-3 days prior   Type of Anesthesia:  Not Indicated   Additional requests/questions:    SignedTinnie NOVAK Schools   03/16/2024, 4:23 PM

## 2024-03-17 NOTE — Telephone Encounter (Signed)
° °  Patient Name: SELDON BARRELL  DOB: [birth year] MRN: [id number]  Primary Cardiologist: Evalene Lunger, MD  Chart reviewed as part of pre-operative protocol coverage. Cataract extractions are recognized in guidelines as low risk surgeries that do not typically require specific preoperative testing. Therefore, given past medical history and time since last visit, based on ACC/AHA guidelines, Keevin E Tillis would be at acceptable risk for the planned procedure without further cardiovascular testing.   Per office protocol, patient can hold Eliquis  for 2 days prior to procedure.    I will route this recommendation to the requesting party via Epic fax function and remove from pre-op pool.  Please call with questions.  Lum LITTIE Louis, NP 03/17/2024, 8:22 AM

## 2024-03-21 ENCOUNTER — Encounter: Payer: Self-pay | Admitting: Ophthalmology

## 2024-03-21 NOTE — Discharge Instructions (Signed)

## 2024-03-22 ENCOUNTER — Encounter: Payer: Self-pay | Admitting: Ophthalmology

## 2024-03-22 NOTE — Anesthesia Preprocedure Evaluation (Signed)
 Anesthesia Evaluation  Patient identified by MRN, date of birth, ID band Patient awake    Reviewed: Allergy & Precautions, H&P , NPO status , Patient's Chart, lab work & pertinent test results  Airway Mallampati: II  TM Distance: >3 FB Neck ROM: Full    Dental no notable dental hx. (+) Missing, Poor Dentition, Chipped   Pulmonary sleep apnea , pneumonia, COPD, former smoker   Pulmonary exam normal breath sounds clear to auscultation       Cardiovascular hypertension, + Past MI and +CHF  Normal cardiovascular exam+ dysrhythmias  Rhythm:Regular Rate:Normal   Cardiology Office Note    Date:  01/19/2024  ID:  Gavin Maxwell, DOB 1945/09/18, MRN [id number] PCP: Gavin Reyes BIRCH, MD  [facility] Providers Cardiologist:  Evalene Lunger, MD    History of Present Illness Gavin Maxwell is a 78 y.o. 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   Patient was initially evaluated by heart care in 2020.  He was referred to EP for SVT.  He underwent ablation March 08, 2019.   Patient was diagnosed with new onset A-fib with RVR in February 2024.  Echo at that time showed EF of 60 to 65%, moderate concentric LVH.  TEE showed EF of 45 to 50%, mild MR.  Troponin peaked at greater than 2000.  He denied any chest pain.  He was successfully cardioverted to normal sinus rhythm.  He was started on amiodarone .  Myoview  Lexiscan  was low risk.  Echo showed EF of 45 to 50%.   The patient was hospitalized 3/4-3/7 with left hip dislocation. He underwent reduction. The patient was seen 06/2023 and was still recovering from left hip reduction. He reports volume overload and he took Lasix  3 times in the  week.  He was instructed he take Lasix  for 3 days then go back down to 20 mg daily.  He was in normal sinus rhythm. Echo showed LVEF 55-60%, G1DD, mild MR.  Repeat kidney function showed dehydration and lasix  was stopped.    The patient was last seen 10/2023 and was overall doing well. He was needing shoulder surgery.    Today, the patient is overall doing well. He is still recovering from shoulder surgery. EKG shows NSR. He denies chest pain, SOB, lower leg edema. He denies any bleeding issues.    Studies Reviewed       Echo 07/2023 1. Left ventricular ejection fraction, by estimation, is 55 to 60%. The  left ventricle has normal function. The left ventricle has no regional  wall motion abnormalities. Left ventricular diastolic parameters are  consistent with Grade I diastolic  dysfunction (impaired relaxation).   2. Right ventricular systolic function is normal. The right ventricular  size is normal.   3. Left atrial size was mildly dilated.   4. The mitral valve is normal in structure. Mild mitral valve  regurgitation. No evidence of mitral stenosis.   5. The aortic valve has an indeterminant number of cusps. Aortic valve  regurgitation is not visualized. No aortic stenosis is present.   6. The inferior vena cava is normal in size with greater than 50%  respiratory variability, suggesting right atrial pressure of 3 mmHg.    Myoview  Lexiscan  07/2022  Narrative & Impression     Normal pharmacologic myocardial perfusion  stress test without evidence of significant ischemia or scar.   Left ventricular systolic function is normal (LVEF > 55%).   Three-vessel coronary artery calcification noted on the attenuation correction CT.  There is also aortic atherosclerosis.   This is a low risk study.       Neuro/Psych  PSYCHIATRIC DISORDERS Anxiety Depression     Neuromuscular disease negative neurological ROS  negative psych ROS   GI/Hepatic negative GI ROS, Neg liver ROS, hiatal  hernia,GERD  ,,  Endo/Other  negative endocrine ROSHypothyroidism    Renal/GU Renal diseasenegative Renal ROS  negative genitourinary   Musculoskeletal negative musculoskeletal ROS (+) Arthritis ,    Abdominal   Peds negative pediatric ROS (+)  Hematology negative hematology ROS (+) Blood dyscrasia, anemia   Anesthesia Other Findings Note: spinal cord stimulator in place and functioning  Hypertension Anxiety Depression GERD (gastroesophageal reflux disease) Primary localized osteoarthrosis, shoulder region, rotator cuff arthropathy History of hiatal hernia Primary localized osteoarthritis of knee Sleep apnea Dysrhythmia Pneumonia Cancer (HCC)  CKD (chronic kidney disease) stage 3, GFR 30-59 ml/min (HCC) COPD (chronic obstructive pulmonary disease) (HCC)  Hearing loss--hearing aid Ambulates with cane Hypothyroidism Chronic diastolic heart failure (HCC) Paroxysmal atrial fibrillation (HCC) Hypertensive chronic kidney disease Non Hodgkin's 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) Spontaneous pneumothorax H/O atrioventricular nodal ablation  SVT (supraventricular tachycardia) Former smoker  Grade I diastolic dysfunction Mild mitral regurgitation by prior echocardiogram  OSA on CPAP Has spinal cord stimulator   Reproductive/Obstetrics negative OB ROS                              Anesthesia Physical Anesthesia Plan  ASA: 3  Anesthesia Plan: MAC   Post-op Pain Management:    Induction: Intravenous  PONV Risk Score and Plan:   Airway Management Planned: Natural Airway and Nasal Cannula  Additional Equipment:   Intra-op Plan:   Post-operative Plan:   Informed Consent: I have reviewed the patients History and Physical, chart, labs and discussed the procedure including the risks, benefits and alternatives for the proposed anesthesia with the patient or authorized representative who has indicated his/her understanding  and acceptance.     Dental Advisory Given  Plan Discussed with: Anesthesiologist, CRNA and Surgeon  Anesthesia Plan Comments: (Patient consented for risks of anesthesia including but not limited to:  - adverse reactions to medications - damage to eyes, teeth, lips or other oral mucosa - nerve damage due to positioning  - sore throat or hoarseness - Damage to heart, brain, nerves, lungs, other parts of body or loss of life  Patient voiced understanding and assent.)         Anesthesia Quick Evaluation

## 2024-03-23 ENCOUNTER — Other Ambulatory Visit: Payer: Self-pay

## 2024-03-23 ENCOUNTER — Ambulatory Visit
Admission: RE | Admit: 2024-03-23 | Discharge: 2024-03-23 | Disposition: A | Attending: Ophthalmology | Admitting: Ophthalmology

## 2024-03-23 ENCOUNTER — Encounter: Payer: Self-pay | Admitting: Ophthalmology

## 2024-03-23 ENCOUNTER — Ambulatory Visit: Payer: Self-pay | Admitting: Anesthesiology

## 2024-03-23 ENCOUNTER — Encounter: Admission: RE | Disposition: A | Payer: Self-pay | Attending: Ophthalmology

## 2024-03-23 DIAGNOSIS — Z87891 Personal history of nicotine dependence: Secondary | ICD-10-CM | POA: Insufficient documentation

## 2024-03-23 DIAGNOSIS — N1832 Chronic kidney disease, stage 3b: Secondary | ICD-10-CM | POA: Diagnosis not present

## 2024-03-23 DIAGNOSIS — I5032 Chronic diastolic (congestive) heart failure: Secondary | ICD-10-CM | POA: Diagnosis not present

## 2024-03-23 DIAGNOSIS — Z7901 Long term (current) use of anticoagulants: Secondary | ICD-10-CM | POA: Insufficient documentation

## 2024-03-23 DIAGNOSIS — I130 Hypertensive heart and chronic kidney disease with heart failure and stage 1 through stage 4 chronic kidney disease, or unspecified chronic kidney disease: Secondary | ICD-10-CM | POA: Diagnosis not present

## 2024-03-23 DIAGNOSIS — I480 Paroxysmal atrial fibrillation: Secondary | ICD-10-CM | POA: Insufficient documentation

## 2024-03-23 DIAGNOSIS — H2511 Age-related nuclear cataract, right eye: Secondary | ICD-10-CM | POA: Insufficient documentation

## 2024-03-23 DIAGNOSIS — G4733 Obstructive sleep apnea (adult) (pediatric): Secondary | ICD-10-CM | POA: Diagnosis not present

## 2024-03-23 DIAGNOSIS — J449 Chronic obstructive pulmonary disease, unspecified: Secondary | ICD-10-CM | POA: Insufficient documentation

## 2024-03-23 DIAGNOSIS — I252 Old myocardial infarction: Secondary | ICD-10-CM | POA: Insufficient documentation

## 2024-03-23 DIAGNOSIS — H401113 Primary open-angle glaucoma, right eye, severe stage: Secondary | ICD-10-CM | POA: Insufficient documentation

## 2024-03-23 HISTORY — PX: CATARACT EXTRACTION W/PHACO: SHX586

## 2024-03-23 HISTORY — PX: GONIOTOMY: SHX7582

## 2024-03-23 SURGERY — PHACOEMULSIFICATION, CATARACT, WITH IOL INSERTION
Anesthesia: Monitor Anesthesia Care | Laterality: Right

## 2024-03-23 MED ORDER — SIGHTPATH DOSE#1 NA CHONDROIT SULF-NA HYALURON 20-15 MG/0.5ML IO SOSY
INTRAOCULAR | Status: DC | PRN
  Administered 2024-03-23: 11:00:00 .5 mL via INTRAOCULAR

## 2024-03-23 MED ORDER — TETRACAINE HCL 0.5 % OP SOLN
OPHTHALMIC | Status: AC
  Filled 2024-03-23: qty 4

## 2024-03-23 MED ORDER — SIGHTPATH DOSE#1 NA HYALUR & NA CHOND-NA HYALUR IO KIT
PACK | INTRAOCULAR | Status: DC | PRN
  Administered 2024-03-23: 11:00:00 1 via OPHTHALMIC

## 2024-03-23 MED ORDER — LACTATED RINGERS IV SOLN: INTRAVENOUS | Status: DC

## 2024-03-23 MED ORDER — CEFUROXIME OPHTHALMIC INJECTION 1 MG/0.1 ML
INJECTION | OPHTHALMIC | Status: DC | PRN
  Administered 2024-03-23: 11:00:00 .1 mL via INTRACAMERAL

## 2024-03-23 MED ORDER — SIGHTPATH DOSE#1 BSS IO SOLN
INTRAOCULAR | Status: DC | PRN
  Administered 2024-03-23: 11:00:00 15 mL via INTRAOCULAR

## 2024-03-23 MED ORDER — CYCLOPENTOLATE HCL 2 % OP SOLN
1.0000 [drp] | OPHTHALMIC | Status: AC | PRN
  Administered 2024-03-23 (×3): 1 [drp] via OPHTHALMIC

## 2024-03-23 MED ORDER — BALANCED SALT IO SOLN
INTRAOCULAR | Status: DC | PRN
  Administered 2024-03-23: 11:00:00 1 mL

## 2024-03-23 MED ORDER — TETRACAINE HCL 0.5 % OP SOLN
1.0000 [drp] | OPHTHALMIC | Status: DC | PRN
  Administered 2024-03-23 (×2): 1 [drp] via OPHTHALMIC

## 2024-03-23 MED ORDER — SIGHTPATH DOSE#1 BSS IO SOLN: INTRAOCULAR | Status: DC | PRN

## 2024-03-23 MED ORDER — PHENYLEPHRINE HCL 10 % OP SOLN
1.0000 [drp] | OPHTHALMIC | Status: AC | PRN
  Administered 2024-03-23 (×3): 1 [drp] via OPHTHALMIC

## 2024-03-23 MED ORDER — CYCLOPENTOLATE HCL 2 % OP SOLN
OPHTHALMIC | Status: AC
  Filled 2024-03-23: qty 2

## 2024-03-23 MED ORDER — PHENYLEPHRINE HCL 10 % OP SOLN
OPHTHALMIC | Status: AC
  Filled 2024-03-23: qty 5

## 2024-03-23 MED ADMIN — Fentanyl Citrate Preservative Free (PF) Inj 100 MCG/2ML: 50 ug | INTRAVENOUS | @ 11:00:00 | NDC 72572017025

## 2024-03-23 MED FILL — Midazolam HCl Inj 2 MG/2ML (Base Equivalent): INTRAMUSCULAR | Qty: 2 | Status: CN

## 2024-03-23 MED FILL — Fentanyl Citrate Preservative Free (PF) Inj 100 MCG/2ML: INTRAMUSCULAR | Qty: 2 | Status: AC

## 2024-03-23 SURGICAL SUPPLY — 10 items
BLADE DUAL KAHOOK SINGLE USE (BLADE) IMPLANT
CLIP IPRISM (KITS) IMPLANT
FEE CATARACT SUITE SIGHTPATH (MISCELLANEOUS) ×1 IMPLANT
GLOVE BIOGEL PI IND STRL 8 (GLOVE) ×1 IMPLANT
GLOVE SURG LX STRL 7.5 STRW (GLOVE) ×1 IMPLANT
GLOVE SURG SYN 6.5 PF PI BL (GLOVE) ×1 IMPLANT
LENS IOL TECNIS EYHANCE 22.5 (Intraocular Lens) IMPLANT
NDL FILTER BLUNT 18X1 1/2 (NEEDLE) ×1 IMPLANT
NEEDLE FILTER BLUNT 18X1 1/2 (NEEDLE) ×1 IMPLANT
SYR 3ML LL SCALE MARK (SYRINGE) ×1 IMPLANT

## 2024-03-23 NOTE — Anesthesia Postprocedure Evaluation (Signed)
 Anesthesia Post Note  Patient: Gavin Maxwell  Procedure(s) Performed: PHACOEMULSIFICATION, CATARACT, WITH IOL INSERTION 7.85, 00:31.1 (Right) GONIOTOMY (Right)  Patient location during evaluation: PACU Anesthesia Type: MAC Level of consciousness: awake and alert Pain management: pain level controlled Vital Signs Assessment: post-procedure vital signs reviewed and stable Respiratory status: spontaneous breathing, nonlabored ventilation, respiratory function stable and patient connected to nasal cannula oxygen  Cardiovascular status: stable and blood pressure returned to baseline Postop Assessment: no apparent nausea or vomiting Anesthetic complications: no   No notable events documented.   Last Vitals:  Vitals:   03/23/24 1055 03/23/24 1100  BP: 131/79 (!) 141/79  Pulse: 67 67  Resp: (!) 21 18  Temp: (!) 36.2 C   SpO2: 100% 100%    Last Pain:  Vitals:   03/23/24 1100  TempSrc:   PainSc: 0-No pain                 Eldana Isip C Clemente Dewey

## 2024-03-23 NOTE — Transfer of Care (Signed)
 Immediate Anesthesia Transfer of Care Note  Patient: Gavin Maxwell  Procedure(s) Performed: PHACOEMULSIFICATION, CATARACT, WITH IOL INSERTION (Right) GONIOTOMY (Right)  Patient Location: PACU  Anesthesia Type: MAC  Level of Consciousness: awake, alert  and patient cooperative  Airway and Oxygen  Therapy: Patient Spontanous Breathing and Patient connected to supplemental oxygen   Post-op Assessment: Post-op Vital signs reviewed, Patient's Cardiovascular Status Stable, Respiratory Function Stable, Patent Airway and No signs of Nausea or vomiting  Post-op Vital Signs: Reviewed and stable  Complications: No notable events documented.

## 2024-03-23 NOTE — H&P (Signed)
 [facility]   Primary Care Physician:  Auston Reyes BIRCH, MD Ophthalmologist: Dr. Dene Etienne  Pre-Procedure History & Physical: HPI:  Gavin Maxwell is a 78 y.o. male here for ophthalmic surgery.   Past Medical History:  Diagnosis Date   Ambulates with cane    straight 4 prong cane   Anxiety    Cancer (HCC)    non hodgins  lymphoma   2004 in remission  Left arm  wears a brace there is a bone broken   Chronic diastolic heart failure (HCC)    CKD (chronic kidney disease) stage 3, GFR 30-59 ml/min (HCC)    COPD (chronic obstructive pulmonary disease) (HCC)    Depression    Dysrhythmia    SVT ,  A.fib   Former smoker    quit x 25 years   GERD (gastroesophageal reflux disease)    Grade I diastolic dysfunction    H/O atrioventricular nodal ablation 03/2019   Hearing loss    wears hearing aids   History of hiatal hernia    Hypertension    Hypertensive chronic kidney disease    Hypothyroidism    Mild mitral regurgitation by prior echocardiogram    Non Hodgkin's lymphoma in remission    in remission   OSA on CPAP    Paroxysmal atrial fibrillation (HCC)    Pneumonia    x 1   Primary localized osteoarthritis of knee 09/26/2014   Primary localized osteoarthrosis, shoulder region, rotator cuff arthropathy 10/04/2013   Sleep apnea    uses cpap nightly   Spinal cord stimulator status    Spontaneous pneumothorax 2019   history   Stage 3b chronic kidney disease (CKD) (HCC)    SVT (supraventricular tachycardia)     Past Surgical History:  Procedure Laterality Date   CARDIAC CATHETERIZATION     20 yrs. ago   CARDIOVERSION N/A 06/03/2022   Procedure: CARDIOVERSION;  Surgeon: Perla Evalene PARAS, MD;  Location: [facility] ORS;  Service: Cardiovascular;  Laterality: N/A;   CHOLECYSTECTOMY     EXCISIONAL TOTAL SHOULDER ARTHROPLASTY WITH ANTIBIOTIC SPACER Left 10/20/2023   Procedure: REMOVAL, HARDWARE, SHOULDER, WITH IRRIGATION, DEBRIDEMENT, AND INSERTION OF ANTIBIOTIC  BEADS OR ANTIBIOTIC SPACER;  Surgeon: Josefina Chew, MD;  Location: [facility];  Service: Orthopedics;  Laterality: Left;   EYE SURGERY Left    pt states he was seeing double and they did surgery to fix it   HIP CLOSED REDUCTION Left 06/09/2023   Procedure: CLOSED REDUCTION HIP, LEFT;  Surgeon: Beverley Evalene BIRCH, MD;  Location: [facility];  Service: Orthopedics;  Laterality: Left;   NASAL SINUS SURGERY     x2   REVERSE SHOULDER ARTHROPLASTY Right 10/04/2013   Procedure: REVERSE SHOULDER ARTHROPLASTY;  Surgeon: Chew SHAUNNA Josefina, MD;  Location: [facility] OR;  Service: Orthopedics;  Laterality: Right;   SHOULDER ACROMIOPLASTY Left    x 5 shoulder surgeries   SVT ABLATION N/A 03/07/2019   Procedure: SVT ABLATION;  Surgeon: Inocencio Soyla Lunger, MD;  Location: [facility];  Service: Cardiovascular;  Laterality: N/A;   TEE WITHOUT CARDIOVERSION N/A 06/03/2022   Procedure: TRANSESOPHAGEAL ECHOCARDIOGRAM;  Surgeon: Perla Evalene PARAS, MD;  Location: [facility] ORS;  Service: Cardiovascular;  Laterality: N/A;   THORACIC LAMINECTOMY FOR SPINAL CORD STIMULATOR N/A 02/19/2023   Procedure: THORACIC LAMINECTOMY AND PLACEMENT OF SPINAL CORD STIMULATOR AND BATTERY;  Surgeon: Beuford Anes, MD;  Location: [facility];  Service: Orthopedics;  Laterality: N/A;   TOTAL HIP ARTHROPLASTY Right 07/26/2019   Procedure:  TOTAL HIP ARTHROPLASTY;  Surgeon: Josefina Chew, MD;  Location: [facility];  Service: Orthopedics;  Laterality: Right;   TOTAL HIP ARTHROPLASTY Left 1998   TOTAL KNEE ARTHROPLASTY Right 09/26/2014   Procedure: RIGHT TOTAL KNEE ARTHROPLASTY;  Surgeon: Chew Josefina, MD;  Location: [facility] OR;  Service: Orthopedics;  Laterality: Right;   TRANSFORAMINAL LUMBAR INTERBODY FUSION (TLIF) WITH PEDICLE SCREW FIXATION 2 LEVEL Right 06/05/2021   Procedure: RIGHT-SIDED LUMBAR 3- LUMBAR 4, LUMBAR 4- LUMBAR 5 TRANSFORAMINAL LUMBAR INTERBODY FUSION AND DECOMPRESSION WITH INSTRUMENTATION AND ALLOGRAFT;  Surgeon: Beuford Anes, MD;  Location: [facility]  OR;  Service: Orthopedics;  Laterality: Right;   VIDEO ASSISTED THORACOSCOPY (VATS) W/TALC  PLEUADESIS Right 08/18/2017   Procedure: VIDEO ASSISTED THORACOSCOPY (VATS)POSSIBLE  W/TALC  PLEUADESIS.POSSIBLE BLEBECTOMY;  Surgeon: Volney Lye, MD;  Location: [facility] ORS;  Service: Thoracic;  Laterality: Right;    Prior to Admission medications  Medication Sig Start Date End Date Taking? Authorizing Provider  acetaminophen  (TYLENOL ) 500 MG tablet Take 1,000 mg by mouth every 6 (six) hours as needed for moderate pain (pain score 4-6).   Yes [provider]  albuterol  (PROVENTIL  HFA;VENTOLIN  HFA) 108 (90 Base) MCG/ACT inhaler Inhale 2 puffs into the lungs every 6 (six) hours as needed for wheezing or shortness of breath. 11/03/17  Yes Linard Alm NOVAK, MD  amiodarone  (PACERONE ) 200 MG tablet Take 1 tablet by mouth once daily 06/29/23  Yes Gollan, Timothy J, MD  apixaban  (ELIQUIS ) 2.5 MG TABS tablet Take 1 tablet (2.5 mg total) by mouth 2 (two) times daily. 06/01/23  Yes Gollan, Timothy J, MD  atorvastatin  (LIPITOR ) 80 MG tablet Take 1 tablet by mouth once daily 02/25/24  Yes Gollan, Timothy J, MD  brimonidine -timolol  (COMBIGAN ) 0.2-0.5 % ophthalmic solution Place 1 drop into both eyes every 12 (twelve) hours.   Yes [provider]  Calcium  Carb-Cholecalciferol  (CALCIUM  600 + D PO) Take 1 tablet by mouth in the morning.   Yes [provider]  FARXIGA  10 MG TABS tablet Take 1 tablet by mouth once daily 03/01/24  Yes Gollan, Timothy J, MD  fenofibrate  160 MG tablet Take 160 mg by mouth in the morning.   Yes [provider]  ferrous sulfate  325 (65 FE) MG tablet Take 325 mg by mouth in the morning.   Yes [provider]  fluticasone  (FLONASE ) 50 MCG/ACT nasal spray Place 2 sprays into both nostrils daily.    Yes [provider]  gabapentin  (NEURONTIN ) 300 MG capsule Take 300 mg by mouth daily.   Yes [provider]  imipramine  (TOFRANIL ) 25 MG tablet  Take 25 mg by mouth at bedtime.   Yes [provider]  LUMIGAN 0.01 % SOLN Place 1 drop into both eyes at bedtime.   Yes [provider]  magnesium  oxide (MAG-OX) 400 MG tablet Take 400 mg by mouth in the morning and at bedtime. With supper and at bedtime 01/23/19  Yes [provider]  methocarbamol  (ROBAXIN ) 750 MG tablet Take 1 tablet (750 mg total) by mouth every 6 (six) hours as needed for muscle spasms. 02/19/23  Yes McKenzie, Kayla J, PA-C  metoprolol  succinate (TOPROL -XL) 25 MG 24 hr tablet Take 1 tablet by mouth in the evening Patient taking differently: Take 25 mg by mouth in the morning. 05/08/23  Yes Gollan, Timothy J, MD  Multiple Vitamin (MULTIVITAMIN WITH MINERALS) TABS tablet Take 1 tablet by mouth in the morning.   Yes [provider]  pantoprazole  (PROTONIX ) 40 MG tablet Take 40 mg by  mouth in the morning.   Yes [provider]  polycarbophil (FIBERCON) 625 MG tablet Take 625 mg by mouth in the morning, at noon, and at bedtime.   Yes [provider]  sertraline  (ZOLOFT ) 50 MG tablet Take 50 mg by mouth at bedtime.   Yes [provider]  furosemide  (LASIX ) 20 MG tablet Take 1 tablet (20 mg total) by mouth once a week. 10/19/23   Furth, Cadence H, PA-C    Allergies as of 03/17/2024 - Review Complete 01/19/2024  Allergen Reaction Noted   Amoxicillin-pot clavulanate Nausea Only and Other (See Comments) 09/23/2013   Celecoxib Nausea Only and Other (See Comments) 09/23/2013   Oxycodone  Other (See Comments) 08/18/2017    Family History  Problem Relation Age of Onset   Breast cancer Mother    Hypertension Mother    Rheum arthritis Father    Cancer Father    Lung cancer Brother    Stroke Paternal Grandfather     Social History   Socioeconomic History   Marital status: Married    Spouse name: Not on file   Number of children: 2   Years of education: Not on file   Highest education level: Not on file   Occupational History   Not on file  Tobacco Use   Smoking status: Former    Current packs/day: 0.00    Average packs/day: 2.0 packs/day for 20.0 years (40.0 ttl pk-yrs)    Types: Cigarettes    Start date: 46    Quit date: 64    Years since quitting: 35.9   Smokeless tobacco: Never   Tobacco comments:    quit 25 years ago  Vaping Use   Vaping status: Never Used  Substance and Sexual Activity   Alcohol use: No   Drug use: No   Sexual activity: Not Currently    Birth control/protection: None  Other Topics Concern   Not on file  Social History Narrative   Not on file   Social Drivers of Health   Tobacco Use: Medium Risk (03/21/2024)   Patient History    Smoking Tobacco Use: Former    Smokeless Tobacco Use: Never    Passive Exposure: Not on Actuary Strain: Low Risk  (11/17/2023)   Received from [facility]   Overall Financial Resource Strain (CARDIA)    Difficulty of Paying Living Expenses: Not hard at all  Food Insecurity: No Food Insecurity (11/17/2023)   Received from [facility]   Epic    Within the past 12 months, you worried that your food would run out before you got the money to buy more.: Never true    Within the past 12 months, the food you bought just didn't last and you didn't have money to get more.: Never true  Transportation Needs: No Transportation Needs (11/17/2023)   Received from [facility] - Transportation    In the past 12 months, has lack of transportation kept you from medical appointments or from getting medications?: No    Lack of Transportation (Non-Medical): No  Physical Activity: Not on file  Stress: Not on file  Social Connections: Patient Declined (10/20/2023)   Social Connection and Isolation Panel    Frequency of Communication with Friends and Family: Patient declined    Frequency of Social Gatherings with Friends and Family: Patient declined    Attends  Religious Services: Patient declined    Database Administrator or Organizations:  Patient declined    Attends Banker Meetings: Patient declined    Marital Status: Patient declined  Intimate Partner Violence: Not At Risk (10/20/2023)   Epic    Fear of Current or Ex-Partner: No    Emotionally Abused: No    Physically Abused: No    Sexually Abused: No  Depression (PHQ2-9): Not on file  Alcohol Screen: Not on file  Housing: Low Risk  (11/17/2023)   [facility]   Epic    In the last 12 months, was there a time when you were not able to pay the mortgage or rent on time?: No    In the past 12 months, how many times have you moved where you were living?: 0    At any time in the past 12 months, were you homeless or living in a shelter (including now)?: No  Utilities: Not At Risk (11/17/2023)   Received from [facility]   Epic    In the past 12 months has the electric, gas, oil, or water  company threatened to shut off services in your home?: No  Health Literacy: Not on file    Review of Systems: See HPI, otherwise negative ROS  Physical Exam: BP (!) 148/85   Pulse 69   Temp 98 F (36.7 C) (Temporal)   Resp 18   Ht 5' 11 (1.803 m)   Wt 98 kg   SpO2 100%   BMI 30.13 kg/m  General:   Alert,  pleasant and cooperative in NAD Head:  Normocephalic and atraumatic. Lungs:  Clear to auscultation.    Heart:  Regular rate and rhythm.   Impression/Plan: Gavin Maxwell is here for ophthalmic surgery.  Risks, benefits, limitations, and alternatives regarding ophthalmic surgery have been reviewed with the patient.  Questions have been answered.  All parties agreeable.   MITTIE GASKIN, MD  03/23/2024, 9:45 AM

## 2024-03-23 NOTE — Op Note (Signed)
 PREOPERATIVE DIAGNOSIS:  Nuclear sclerotic cataract  right eye. 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 Item Serial No. Manufacturer Lot No. LRB No. Used Action  LENS IOL TECNIS EYHANCE 22.5 - [id number] Intraocular Lens LENS IOL TECNIS EYHANCE 22.5 [id number] [employer]  Right 1 Implanted    SURGEON:  Dene FABIENE Etienne, MD   ANESTHESIA:  Topical with tetracaine  drops augmented with 1% preservative-free intracameral lidocaine .    COMPLICATIONS:  None.   DESCRIPTION OF PROCEDURE:  The patient was identified in the holding room and transported to the operating room and placed in the supine position under the operating microscope.  The right eye was identified as the operative eye and it was prepped and draped in the usual sterile ophthalmic fashion.   A 1 millimeter clear-corneal paracentesis was made at the 12:00 position.  0.5 ml of preservative-free 1% lidocaine  was injected into the anterior chamber.  The anterior chamber was filled with Viscoat viscoelastic.  A 2.4 millimeter keratome was used to make a near-clear corneal incision at the 9:00 position. The microscope was adjusted and a gonioprism was used to visulaize the trabecular meshwork.  The [facility] Dual Blade was advanced across the anterior chamber under viscoelastic.  The blade was used to mark the trabecular meshwork at the 1:30 position.  The blade was placed two clock hours clockwise into the meshwork.  Proper postioning was confirmed.  The blade ws passed counterclockwise through  the meshwork to excise approximately two to three clock-hours of trabecular meshwork.   A curvilinear capsulorrhexis was made with a cystotome and capsulorrhexis forceps.  Balanced salt  solution was used to hydrodissect and hydrodelineate the nucleus.   Phacoemulsification was then used in stop and chop fashion to remove the lens nucleus and epinucleus.  The remaining cortex was then removed using the irrigation and aspiration handpiece. Provisc was then placed into the capsular bag to distend it for lens placement.  A lens was then injected into the capsular bag.  The remaining viscoelastic was aspirated.   Wounds were hydrated with balanced salt  solution.  The anterior chamber was inflated to a physiologic pressure with balanced salt  solution.  No wound leaks were noted. Cefuroxime  0.1 ml of a 10mg /ml solution was injected into the anterior chamber for a dose of 1 mg of intracameral antibiotic at the completion of the case.  The patient was taken to the recovery room in stable condition without complications of anesthesia or surgery.

## 2024-03-25 ENCOUNTER — Emergency Department
Admission: EM | Admit: 2024-03-25 | Discharge: 2024-03-25 | Disposition: A | Discharge status: Home / Self Care | Attending: Emergency Medicine | Admitting: Emergency Medicine

## 2024-03-25 ENCOUNTER — Emergency Department

## 2024-03-25 ENCOUNTER — Other Ambulatory Visit: Payer: Self-pay

## 2024-03-25 DIAGNOSIS — S022XXA Fracture of nasal bones, initial encounter for closed fracture: Secondary | ICD-10-CM | POA: Insufficient documentation

## 2024-03-25 DIAGNOSIS — I509 Heart failure, unspecified: Secondary | ICD-10-CM | POA: Insufficient documentation

## 2024-03-25 DIAGNOSIS — Z23 Encounter for immunization: Secondary | ICD-10-CM | POA: Insufficient documentation

## 2024-03-25 DIAGNOSIS — S0992XA Unspecified injury of nose, initial encounter: Secondary | ICD-10-CM | POA: Diagnosis present

## 2024-03-25 DIAGNOSIS — J449 Chronic obstructive pulmonary disease, unspecified: Secondary | ICD-10-CM | POA: Insufficient documentation

## 2024-03-25 DIAGNOSIS — E039 Hypothyroidism, unspecified: Secondary | ICD-10-CM | POA: Diagnosis not present

## 2024-03-25 DIAGNOSIS — I110 Hypertensive heart disease with heart failure: Secondary | ICD-10-CM | POA: Insufficient documentation

## 2024-03-25 DIAGNOSIS — I482 Chronic atrial fibrillation, unspecified: Secondary | ICD-10-CM | POA: Insufficient documentation

## 2024-03-25 DIAGNOSIS — Z7901 Long term (current) use of anticoagulants: Secondary | ICD-10-CM | POA: Insufficient documentation

## 2024-03-25 DIAGNOSIS — W01198A Fall on same level from slipping, tripping and stumbling with subsequent striking against other object, initial encounter: Secondary | ICD-10-CM | POA: Diagnosis not present

## 2024-03-25 DIAGNOSIS — W19XXXA Unspecified fall, initial encounter: Secondary | ICD-10-CM

## 2024-03-25 LAB — COMPREHENSIVE METABOLIC PANEL WITH GFR
ALT: 22 U/L (ref 0–44)
AST: 46 U/L — ABNORMAL HIGH (ref 15–41)
Albumin: 4 g/dL (ref 3.5–5.0)
Alkaline Phosphatase: 68 U/L (ref 38–126)
Anion gap: 11 (ref 5–15)
BUN: 40 mg/dL — ABNORMAL HIGH (ref 8–23)
CO2: 26 mmol/L (ref 22–32)
Calcium: 9.2 mg/dL (ref 8.9–10.3)
Chloride: 101 mmol/L (ref 98–111)
Creatinine, Ser: 2.37 mg/dL — ABNORMAL HIGH (ref 0.61–1.24)
GFR, Estimated: 27 mL/min — ABNORMAL LOW
Glucose, Bld: 103 mg/dL — ABNORMAL HIGH (ref 70–99)
Potassium: 4.8 mmol/L (ref 3.5–5.1)
Sodium: 138 mmol/L (ref 135–145)
Total Bilirubin: 0.5 mg/dL (ref 0.0–1.2)
Total Protein: 7.4 g/dL (ref 6.5–8.1)

## 2024-03-25 LAB — CBC
HCT: 35.9 % — ABNORMAL LOW (ref 39.0–52.0)
Hemoglobin: 11.7 g/dL — ABNORMAL LOW (ref 13.0–17.0)
MCH: 33.4 pg (ref 26.0–34.0)
MCHC: 32.6 g/dL (ref 30.0–36.0)
MCV: 102.6 fL — ABNORMAL HIGH (ref 80.0–100.0)
Platelets: 234 K/uL (ref 150–400)
RBC: 3.5 MIL/uL — ABNORMAL LOW (ref 4.22–5.81)
RDW: 13.9 % (ref 11.5–15.5)
WBC: 5.2 K/uL (ref 4.0–10.5)
nRBC: 0 % (ref 0.0–0.2)

## 2024-03-25 LAB — TROPONIN T, HIGH SENSITIVITY: Troponin T High Sensitivity: 46 ng/L — ABNORMAL HIGH (ref 0–19)

## 2024-03-25 MED ORDER — LIDOCAINE-EPINEPHRINE-TETRACAINE (LET) TOPICAL GEL
3.0000 mL | Freq: Once | TOPICAL | Status: AC
  Administered 2024-03-25: 3 mL via TOPICAL
  Filled 2024-03-25: qty 3

## 2024-03-25 MED ORDER — TETANUS-DIPHTH-ACELL PERTUSSIS 5-2-15.5 LF-MCG/0.5 IM SUSP
0.5000 mL | Freq: Once | INTRAMUSCULAR | Status: AC
  Administered 2024-03-25: 0.5 mL via INTRAMUSCULAR
  Filled 2024-03-25: qty 0.5

## 2024-03-25 NOTE — ED Triage Notes (Signed)
 Patient reports mechanical fall today and weakness. Mutliple abrasions to face. Patient is on eliquis , denies LOC

## 2024-03-25 NOTE — ED Provider Notes (Signed)
 "  [facility] Provider Note    Event Date/Time   First MD Initiated Contact with Patient 03/25/24 1405     (approximate)   History   Fall   HPI  Gavin Maxwell is a 78 y.o. male  with a past medical history of paroxysmal atrial fibrillation, CHF, NSTEMI, depression, anxiety, COPD, HTN, hypothyroidism, CKD, SVT, GERD presents to the emergency department with his wife following a mechanical fall that happened at [employer] this morning.  Patient reports he was stepping in the restaurant on a concrete floor and tripped over his own foot and fell forwards hitting his left face and then fell backwards onto his back.  He reports pain in the area of his chest all over and in the back of his shoulders as well as abrasions to his right finger knuckles and pain along his nasal bridge and left cheek. Patient is on Eliquis .  He denies any other injuries, or losing consciousness, weakness, numbness, vision changes, neck pain. Unsure of last tetanus vaccine.  Physical Exam   Triage Vital Signs: ED Triage Vitals  Encounter Vitals Group     BP 03/25/24 1134 108/64     Girls Systolic BP Percentile --      Girls Diastolic BP Percentile --      Boys Systolic BP Percentile --      Boys Diastolic BP Percentile --      Pulse Rate 03/25/24 1134 70     Resp 03/25/24 1134 19     Temp 03/25/24 1134 97.8 F (36.6 C)     Temp Source 03/25/24 1134 Oral     SpO2 03/25/24 1134 100 %     Weight 03/25/24 1135 217 lb (98.4 kg)     Height 03/25/24 1135 5' 11 (1.803 m)     Head Circumference --      Peak Flow --      Pain Score --      Pain Loc --      Pain Education --      Exclude from Growth Chart --     Most recent vital signs: Vitals:   03/25/24 1539 03/25/24 1546  BP: 110/60   Pulse: 70   Resp: 18   Temp: 98 F (36.7 C)   SpO2: 100% 100%    General: Awake, in no acute distress. Appears stated age. Head: Normocephalic. Eyes: PERRLA. EOMs  intact. Ears/Nose/Throat: Nares patent, no nasal discharge or septal hematoma present. Oropharynx moist, no erythema or exudate. Dentition intact.  Neck: Supple, no nuchal rigidity. CV: Good peripheral perfusion.  Respiratory:Normal respiratory effort.  No respiratory distress. CTAB. GI: Soft, non-distended, nontender. MSK: Normal ROM and  5/5 strength in b/l upper and lower extremities. TTP along left cheek with ecchymoses noted.  Skin:Warm, dry, abrasion and ecchymoses to nasal bridge, 4 cm laceration above left eyebrow, abrasions to several right finger knuckles. Neurological: A&Ox4 to person, place, time, and situation. Cranial nerves II-XII intact. Sensation intact. Strength symmetric. No focal deficits.   ED Results / Procedures / Treatments   Labs (all labs ordered are listed, but only abnormal results are displayed) Labs Reviewed  COMPREHENSIVE METABOLIC PANEL WITH GFR - Abnormal; Notable for the following components:      Result Value   Glucose, Bld 103 (*)    BUN 40 (*)    Creatinine, Ser 2.37 (*)    AST 46 (*)    GFR, Estimated 27 (*)    All other components within  normal limits  CBC - Abnormal; Notable for the following components:   RBC 3.50 (*)    Hemoglobin 11.7 (*)    HCT 35.9 (*)    MCV 102.6 (*)    All other components within normal limits  TROPONIN T, HIGH SENSITIVITY - Abnormal; Notable for the following components:   Troponin T High Sensitivity 46 (*)    All other components within normal limits  TROPONIN T, HIGH SENSITIVITY     EKG  1st degree AV block present Rate: 64 bpm Axis: Left deviation PR Interval: 228 ms QRS Complex: 120 ms ST Segment: isometric QTc interval: 462 ms No evidence of acute ischemic changes.   RADIOLOGY CXR IMPRESSION: Mild atelectasis at the left base. No acute airspace disease.  CT Cervical Spine IMPRESSION: 1. No acute findings. 2. Extensive degenerative changes throughout the cervical spine,  including hypertrophic changes within the atlantoaxial joint, slight degenerative anterolisthesis at C2-3, C4-5, C5-6, and C6-7, slight degenerative retrolisthesis at C3-4, and moderate facet hypertrophic changes bilaterally at C2-3 and on the right at C4-5 and C5-6. 3. Biapical pulmonary blebs.  CT head IMPRESSION: 1. No acute intracranial abnormality related to the head trauma. 2. Left forehead soft tissue swelling. 3. Incidental cavum septum pellucidum et vergae and right lens replacement. 4. Prior functional endoscopic sinus surgery with left maxillary sinus mucosal thickening.  CT maxillofacial IMPRESSION: 1. Medially displaced left nasal bone fracture and nasal septum fracture without significant hematoma. 2. Left supraorbital and left maxillary soft tissue swelling without underlying fracture.  PROCEDURES:  Critical Care performed: No   .Laceration Repair  Date/Time: 03/25/2024 6:37 PM  Performed by: Sheron Hampton Cost, PA-C Authorized by: Sheron Salm, PA-C   Consent:    Consent obtained:  Verbal   Consent given by:  Patient   Risks, benefits, and alternatives were discussed: yes     Risks discussed:  Need for additional repair, infection, pain, poor cosmetic result and poor wound healing Universal protocol:    Procedure explained and questions answered to patient or proxy's satisfaction: yes     Immediately prior to procedure, a time out was called: yes     Patient identity confirmed:  Verbally with patient Anesthesia:    Anesthesia method:  Topical application   Topical anesthetic:  LET Laceration details:    Location:  Face   Face location:  Forehead (above left eyebrow)   Length (cm):  4 Pre-procedure details:    Preparation:  Patient was prepped and draped in usual sterile fashion Exploration:    Hemostasis achieved with:  LET   Wound exploration: wound explored through full range of motion and entire depth of wound visualized     Wound extent: no  foreign body, no signs of injury, no nerve damage, no tendon damage, no underlying fracture and no vascular damage     Contaminated: no   Treatment:    Area cleansed with:  Povidone-iodine    Amount of cleaning:  Standard   Irrigation solution:  Sterile saline   Irrigation volume:  50 mL   Irrigation method:  Syringe Skin repair:    Repair method:  Tissue adhesive Approximation:    Approximation:  Close Repair type:    Repair type:  Simple Post-procedure details:    Dressing:  Sterile dressing and non-adherent dressing   Procedure completion:  Tolerated well, no immediate complications .Laceration Repair  Date/Time: 03/25/2024 6:39 PM  Performed by: Sheron Salm, PA-C Authorized by: Sheron Salm, PA-C   Consent:    Consent  obtained:  Verbal   Consent given by:  Patient   Risks, benefits, and alternatives were discussed: yes     Risks discussed:  Infection and pain Universal protocol:    Procedure explained and questions answered to patient or proxy's satisfaction: yes     Immediately prior to procedure, a time out was called: yes     Patient identity confirmed:  Verbally with patient Anesthesia:    Anesthesia method:  None Laceration details:    Location: nose.   Length (cm):  1 Pre-procedure details:    Preparation:  Patient was prepped and draped in usual sterile fashion Exploration:    Hemostasis achieved with:  Direct pressure   Wound extent: underlying fracture     Wound extent: no foreign body and no vascular damage     Contaminated: no   Treatment:    Area cleansed with:  Povidone-iodine    Amount of cleaning:  Standard   Irrigation solution:  Sterile saline   Irrigation volume:  50 mL   Irrigation method:  Syringe Skin repair:    Repair method:  Steri-Strips   Number of Steri-Strips:  3 Approximation:    Approximation:  Close Repair type:    Repair type:  Simple Post-procedure details:    Dressing:  Sterile dressing   Procedure completion:   Tolerated well, no immediate complications     MEDICATIONS ORDERED IN ED: Medications  Tdap (ADACEL) injection 0.5 mL (0.5 mLs Intramuscular Given 03/25/24 1512)  lidocaine -EPINEPHrine -tetracaine  (LET) topical gel (3 mLs Topical Given 03/25/24 1513)     IMPRESSION / MDM / ASSESSMENT AND PLAN / ED COURSE  I reviewed the triage vital signs and the nursing notes.                              Differential diagnosis includes, but is not limited to, mechanical fall, abrasion, forehead laceration, head injury, ACS  Patient's presentation is most consistent with acute complicated illness / injury requiring diagnostic workup.  Patient is an 78 year old male presenting following a mechanical fall that occurred today.  He did have some chest discomfort as well as upper back discomfort after the fall.  CBC is unremarkable for any acute findings, hemoglobin stable 11.7.  CMP with BUN of 40, creatinine 2.37, AST of 46 and GFR of 27.  He does have known CKD, and BUN and creatinine appear consistent with his baseline. First Troponin 46.  EKG shows first-degree AV block, which is consistent with past EKGs.   See procedure note for full details regarding laceration repair above the left eyebrow with Dermabond and Steri-Strips along the nasal bridge.  Wound care instructions were discussed with them as well as the left nasal bone fracture and nasal septum fracture.  Discussed no nose blowing and recommended ENT follow-up outpatient.  They also need to follow-up with their primary care provider.  Wife and patient do not want to wait for second troponin to be drawn and resulted at this time although the first one was mildly elevated.  Patient requesting to leave AMA.   This patient has elected to leave against medical advice. In my opinion, the patient has capacity to leave AMA. The patient is clinically sober, free from distracting injury, appears to have intact insight and judgment and reason, and in my  opinion has capacity to make decisions. I explained to the patient that his symptoms may represent clinically significant ACS and the patient verbalized understanding of  my concerns.   I had a discussion with the patient about their workup and results, and that they may still have concerns of ACS and a second troponin was needed given his first troponin was elevated at a level of 46. I informed the patient that the next step in diagnosis and treatment would be obtaining the second lab level and they verbalized understanding of this as well. I explained the risks of leaving without further workup or treatment, which included reasonably foreseeable complications such as death, serious injury, permanent disability, and cardiac injury.    The patient is refusing any further care, specifically awaiting the second troponin draw, and is leaving against medical advice. I am unable to convince the patient to stay. I have asked them to return as soon as possible to complete their evaluation, and also explained that they were welcome to return to the ER for further evaluation whenever they choose. I have asked the patient to follow up with their primary doctor as soon as possible. I have answered all their questions. Patient signed AMA paperwork.     FINAL CLINICAL IMPRESSION(S) / ED DIAGNOSES   Final diagnoses:  Fracture of nasal bones, initial encounter for closed fracture  Fall, initial encounter     Rx / DC Orders   ED Discharge Orders     None        Note:  This document was prepared using Dragon voice recognition software and may include unintentional dictation errors.     Sheron Salm, PA-C 03/25/24 CORINN    Dicky Anes, MD 03/25/24 2117  "

## 2024-03-25 NOTE — Discharge Instructions (Addendum)
 You have been seen in the Emergency Department (ED) today for a mechanical fall and laceration (cut) as well as a nasal bone fracture.  We were able to close the lacerations with skin glue and/or tape.  Please keep the wound dry for about 24 hours.  At that point you can get it wet, in the shower, for example, but do not submerge it in water .  In 1 to 2 weeks the glue and/or tape will start to come off on its own.  Please do not pull it off early, allow it to fall off on its own.  If there are edges that are starting to pull up, you can trim them with a clean pair of small scissors.  Please take Tylenol  (acetaminophen ) as needed for discomfort as written on the box unless your doctor has instructed you not to.   Please follow up with your doctor as soon as possible regarding today's emergent visit.   Return to the ED or call your doctor if you notice any signs of infection such as fever, increased pain, increased redness, pus, or any other symptoms that concern you.   Do not blow your nose for the next few weeks.  Follow-up with the ear nose throat doctor, Dr. Blair outpatient.  I have provided you the number to call their office.  Return to the emergency department if you experience any bleeding of your nose.  You will be leaving AGAINST MEDICAL ADVICE due to not waiting on one of the lab values for your heart to see if you are having any permanent heart damage or a heart attack. This may result in disability or death.  You may return to the emergency department at any time for chest pain, shortness of breath, or any new, worsening or concerning symptoms.

## 2024-03-25 NOTE — ED Triage Notes (Addendum)
 First nurse note: pt to ED [facility] from restaurant for fall, tripped. +hit head, laceration to forehead. +blood thinners. Denies LOC. VSS with EMS

## 2024-03-25 NOTE — ED Provider Notes (Signed)
 EKG inter by me at 1420 heart rate 65 QRS 120 QTc 460 First-degree AV block without evidence of acute ischemia  Medical screening examination/treatment/procedure(s) were conducted as a shared visit with non-physician practitioner(s) and myself.  I personally evaluated the patient during the encounter.    I personally saw and evaluated the patient.  He reports that he has falls occasionally, today reports stumbling and causing a fall while at [employer].  He is awake alert oriented there is no septal hematoma, he has a laceration has been repaired over his left eye.  He is awake alert feels well.  Reportedly reported having some preceding weakness but on further history taking from the patient and his wife it seems that this is not a new symptom for him nor is his occasional falling  CT imaging reassuring.  Chronic renal disease.  Patient did not wish to stay for second troponin but reports that achiness across his chest is from of feeling achy from trying to grab himself.  Does not explicitly report any obvious ACS like symptoms.  Also with his chronic renal disease not too surprising seeing his troponin is minimally elevated  He and his wife feel comfortable going home.  Apologize for delay, ER is been quite busy, wife expressing concerns about time and efficiency of the emergency department today   Dicky Anes, MD 03/25/24 1701

## 2024-03-25 NOTE — ED Notes (Signed)
 States he tripped and fell  Has abrasions to hands,face with laceration over left eye

## 2024-04-26 ENCOUNTER — Other Ambulatory Visit: Payer: Self-pay | Admitting: Cardiovascular Disease
